# Patient Record
Sex: Male | Born: 1935 | Race: White | Hispanic: No | Smoking: Never smoker
Health system: Southern US, Community
[De-identification: ages and names within clinical notes are randomized; demographics above are authoritative.]

## PROBLEM LIST (undated history)

## (undated) DIAGNOSIS — J45909 Unspecified asthma, uncomplicated: Secondary | ICD-10-CM

## (undated) DIAGNOSIS — I10 Essential (primary) hypertension: Secondary | ICD-10-CM

## (undated) DIAGNOSIS — K219 Gastro-esophageal reflux disease without esophagitis: Secondary | ICD-10-CM

## (undated) DIAGNOSIS — R609 Edema, unspecified: Secondary | ICD-10-CM

## (undated) DIAGNOSIS — F39 Unspecified mood [affective] disorder: Secondary | ICD-10-CM

## (undated) HISTORY — DX: Unspecified mood (affective) disorder: F39

## (undated) HISTORY — PX: HERNIA REPAIR: SHX51

---

## 2009-05-20 ENCOUNTER — Ambulatory Visit: Payer: Self-pay | Admitting: Unknown Physician Specialty

## 2009-05-25 ENCOUNTER — Ambulatory Visit: Payer: Self-pay | Admitting: Gastroenterology

## 2009-06-08 ENCOUNTER — Ambulatory Visit: Payer: Self-pay | Admitting: Surgery

## 2009-06-09 ENCOUNTER — Ambulatory Visit: Payer: Self-pay | Admitting: Surgery

## 2009-06-16 ENCOUNTER — Ambulatory Visit: Payer: Self-pay | Admitting: Surgery

## 2009-06-23 ENCOUNTER — Ambulatory Visit: Payer: Self-pay | Admitting: Surgery

## 2011-10-08 ENCOUNTER — Emergency Department: Payer: Self-pay | Admitting: *Deleted

## 2014-03-08 DIAGNOSIS — J309 Allergic rhinitis, unspecified: Secondary | ICD-10-CM | POA: Insufficient documentation

## 2014-03-08 DIAGNOSIS — I10 Essential (primary) hypertension: Secondary | ICD-10-CM | POA: Insufficient documentation

## 2016-02-08 ENCOUNTER — Encounter: Payer: Self-pay | Admitting: *Deleted

## 2016-02-16 ENCOUNTER — Ambulatory Visit
Admission: RE | Admit: 2016-02-16 | Discharge: 2016-02-16 | Disposition: A | Discharge status: Home / Self Care | Payer: Medicare Other | Source: Ambulatory Visit | Attending: Ophthalmology | Admitting: Ophthalmology

## 2016-02-16 ENCOUNTER — Encounter: Payer: Self-pay | Admitting: *Deleted

## 2016-02-16 ENCOUNTER — Ambulatory Visit: Payer: Medicare Other | Admitting: Anesthesiology

## 2016-02-16 ENCOUNTER — Encounter
Admission: RE | Disposition: A | Discharge status: Home / Self Care | Payer: Self-pay | Source: Ambulatory Visit | Attending: Ophthalmology

## 2016-02-16 DIAGNOSIS — I10 Essential (primary) hypertension: Secondary | ICD-10-CM | POA: Insufficient documentation

## 2016-02-16 DIAGNOSIS — H52222 Regular astigmatism, left eye: Secondary | ICD-10-CM | POA: Diagnosis not present

## 2016-02-16 DIAGNOSIS — H2512 Age-related nuclear cataract, left eye: Secondary | ICD-10-CM | POA: Insufficient documentation

## 2016-02-16 DIAGNOSIS — K219 Gastro-esophageal reflux disease without esophagitis: Secondary | ICD-10-CM | POA: Insufficient documentation

## 2016-02-16 DIAGNOSIS — M7989 Other specified soft tissue disorders: Secondary | ICD-10-CM | POA: Diagnosis not present

## 2016-02-16 DIAGNOSIS — J45909 Unspecified asthma, uncomplicated: Secondary | ICD-10-CM | POA: Diagnosis not present

## 2016-02-16 HISTORY — PX: CATARACT EXTRACTION W/PHACO: SHX586

## 2016-02-16 HISTORY — DX: Unspecified asthma, uncomplicated: J45.909

## 2016-02-16 HISTORY — DX: Gastro-esophageal reflux disease without esophagitis: K21.9

## 2016-02-16 HISTORY — DX: Edema, unspecified: R60.9

## 2016-02-16 HISTORY — DX: Essential (primary) hypertension: I10

## 2016-02-16 SURGERY — PHACOEMULSIFICATION, CATARACT, WITH IOL INSERTION
Anesthesia: Monitor Anesthesia Care | Site: Eye | Laterality: Left | Wound class: Clean

## 2016-02-16 MED ORDER — LIDOCAINE HCL (PF) 4 % IJ SOLN
INTRAMUSCULAR | Status: AC
  Filled 2016-02-16: qty 5

## 2016-02-16 MED ORDER — SODIUM HYALURONATE 10 MG/ML IO SOLN
INTRAOCULAR | Status: AC
  Filled 2016-02-16: qty 0.85

## 2016-02-16 MED ORDER — ARMC OPHTHALMIC DILATING GEL: 1.0000 "application " | OPHTHALMIC | Status: AC | PRN

## 2016-02-16 MED ORDER — TETRACAINE HCL 0.5 % OP SOLN
OPHTHALMIC | Status: AC
  Filled 2016-02-16: qty 2

## 2016-02-16 MED ORDER — MOXIFLOXACIN HCL 0.5 % OP SOLN: 1.0000 [drp] | OPHTHALMIC | Status: DC | PRN

## 2016-02-16 MED ORDER — MIDAZOLAM HCL 2 MG/2ML IJ SOLN
INTRAMUSCULAR | Status: DC | PRN
Start: 2016-02-16 — End: 2016-02-16
  Administered 2016-02-16 (×2): .5 mg via INTRAVENOUS

## 2016-02-16 MED ORDER — FENTANYL CITRATE (PF) 100 MCG/2ML IJ SOLN
INTRAMUSCULAR | Status: DC | PRN
  Administered 2016-02-16 (×2): 25 ug via INTRAVENOUS

## 2016-02-16 MED ORDER — POVIDONE-IODINE 5 % OP SOLN
1.0000 "application " | Freq: Once | OPHTHALMIC | Status: AC
  Administered 2016-02-16: 1 via OPHTHALMIC

## 2016-02-16 MED ORDER — ARMC OPHTHALMIC DILATING GEL
OPHTHALMIC | Status: AC
  Administered 2016-02-16: 09:00:00
  Filled 2016-02-16: qty 0.25

## 2016-02-16 MED ORDER — POVIDONE-IODINE 5 % OP SOLN
OPHTHALMIC | Status: AC
  Filled 2016-02-16: qty 30

## 2016-02-16 MED ORDER — TETRACAINE HCL 0.5 % OP SOLN
1.0000 [drp] | Freq: Once | OPHTHALMIC | Status: AC
  Administered 2016-02-16: 1 [drp] via OPHTHALMIC

## 2016-02-16 MED ORDER — SODIUM HYALURONATE 23 MG/ML IO SOLN
INTRAOCULAR | Status: AC
  Filled 2016-02-16: qty 0.6

## 2016-02-16 MED ORDER — SODIUM CHLORIDE 0.9 % IV SOLN
INTRAVENOUS | Status: DC
  Administered 2016-02-16 (×2): via INTRAVENOUS

## 2016-02-16 MED ORDER — MOXIFLOXACIN HCL 0.5 % OP SOLN
OPHTHALMIC | Status: AC
  Filled 2016-02-16: qty 3

## 2016-02-16 MED ORDER — EPINEPHRINE HCL 1 MG/ML IJ SOLN
INTRAMUSCULAR | Status: AC
  Filled 2016-02-16: qty 2

## 2016-02-16 SURGICAL SUPPLY — 21 items
CANNULA ANT/CHMB 27GA (MISCELLANEOUS) ×4 IMPLANT
CUP MEDICINE 2OZ PLAST GRAD ST (MISCELLANEOUS) ×2 IMPLANT
GLOVE BIO SURGEON STRL SZ8 (GLOVE) ×2 IMPLANT
GLOVE BIOGEL M 6.5 STRL (GLOVE) ×2 IMPLANT
GLOVE SURG LX 7.5 STRW (GLOVE) ×1
GLOVE SURG LX STRL 7.5 STRW (GLOVE) ×1 IMPLANT
GOWN STRL REUS W/ TWL LRG LVL3 (GOWN DISPOSABLE) ×2 IMPLANT
GOWN STRL REUS W/TWL LRG LVL3 (GOWN DISPOSABLE) ×2
LENS IOL TECNIS SYMFONY 24.5 (Intraocular Lens) ×2 IMPLANT
PACK CATARACT (MISCELLANEOUS) ×2 IMPLANT
PACK CATARACT BRASINGTON LX (MISCELLANEOUS) ×2 IMPLANT
PACK EYE AFTER SURG (MISCELLANEOUS) ×2 IMPLANT
SOL BSS BAG (MISCELLANEOUS) ×2
SOL PREP PVP 2OZ (MISCELLANEOUS) ×2
SOLUTION BSS BAG (MISCELLANEOUS) ×1 IMPLANT
SOLUTION PREP PVP 2OZ (MISCELLANEOUS) ×1 IMPLANT
SYR 3ML LL SCALE MARK (SYRINGE) ×4 IMPLANT
SYR 5ML LL (SYRINGE) ×2 IMPLANT
SYR TB 1ML 27GX1/2 LL (SYRINGE) ×2 IMPLANT
WATER STERILE IRR 250ML POUR (IV SOLUTION) ×2 IMPLANT
WIPE NON LINTING 3.25X3.25 (MISCELLANEOUS) ×2 IMPLANT

## 2016-02-16 NOTE — Op Note (Signed)
OPERATIVE NOTE  Brandon Calhoun [id number] 02/16/2016   PREOPERATIVE DIAGNOSIS:  Nuclear sclerotic cataract left eye.  H25.12 2.  Regular astigmatism. 3.  Nasal ptyergium   POSTOPERATIVE DIAGNOSIS:    same.   PROCEDURE:  Phacoemusification with posterior chamber intraocular lens placement of the left eye   LENS:   Implant Name Type Inv. Item Serial No. Manufacturer Lot No. LRB No. Used  LENS IOL SYMFONY 24.5 - [id number] Intraocular Lens LENS IOL SYMFONY 24.5 [id number] AMO   Left 1       [id number] +24.5 at 126 degreess   ULTRASOUND TIME: 1 minutes 10 seconds.  CDE 7.41   SURGEON:  Willey BladeBradley Robbyn Hodkinson, MD, MPH   ANESTHESIA:  Topical with tetracaine drops and 2% Xylocaine jelly, augmented with 1% preservative-free intracameral lidocaine.   COMPLICATIONS:  None.   DESCRIPTION OF PROCEDURE:  The patient was identified in the holding room and the horizontal meridian was marked with the patient sitting upright and transported to the operating room and placed in the supine position under the operating microscope.  The left eye was identified as the operative eye and it was prepped and draped in the usual sterile ophthalmic fashion.   A 1.0 millimeter clear-corneal paracentesis was made at the 5:00 position. 0.5 ml of preservative-free 1% lidocaine with epinephrine was injected into the anterior chamber.  The anterior chamber was filled with Healon 5 viscoelastic.  A 2.4 millimeter keratome was used to make a near-clear corneal incision at the 2:00 position.  A curvilinear capsulorrhexis was made with a cystotome and capsulorrhexis forceps.  Balanced salt solution was used to hydrodissect and hydrodelineate the nucleus.   Phacoemulsification was then used in stop and chop fashion to remove the lens nucleus and epinucleus.  The remaining cortex was then removed using the irrigation and aspiration handpiece. Healon was then placed into the capsular bag to distend it for lens placement.  A lens was  then injected into the capsular bag.  The verion was used to align the lens close to 126 degrees  The remaining viscoelastic was aspirated and the lens was again aligned precisely to 126.   Wounds were hydrated with balanced salt solution.  The anterior chamber was inflated to a physiologic pressure with balanced salt solution.   Intracameral vigamox 0.1 mL undiltued was injected into the eye.  No wound leaks were noted.  Topical Vigamox drops were applied to the eye.  The patient was taken to the recovery room in stable condition without complications of anesthesia or surgery  Willey BladeBradley Aurielle Slingerland 02/16/2016, 10:18 AM

## 2016-02-16 NOTE — Transfer of Care (Signed)
Immediate Anesthesia Transfer of Care Note  Patient: Brandon Calhoun  Procedure(s) Performed: Procedure(s) with comments: CATARACT EXTRACTION PHACO AND INTRAOCULAR LENS PLACEMENT (IOC) (Left) - US 1.10 AP% 10.6 CDE 7.41 Fluid Pack Lot # [id number] H  Patient Location: PACU  Anesthesia Type:MAC  Level of Consciousness: awake, alert  and oriented  Airway & Oxygen Therapy: Patient Spontanous Breathing  Post-op Assessment: Report given to RN and Post -op Vital signs reviewed and stable  Post vital signs: Reviewed and stable  Last Vitals:  Vitals:   02/16/16 0900  BP: (!) 169/63  Pulse: 82  Resp: 18  Temp: 36.9 C    Last Pain:  Vitals:   02/16/16 0900  TempSrc: Oral         Complications: No apparent anesthesia complications

## 2016-02-16 NOTE — Anesthesia Preprocedure Evaluation (Signed)
Anesthesia Evaluation  Patient identified by MRN, date of birth, ID band Patient awake    Reviewed: Allergy & Precautions, H&P , NPO status , Patient's Chart, lab work & pertinent test results, reviewed documented beta blocker date and time   History of Anesthesia Complications Negative for: history of anesthetic complications  Airway Mallampati: I  TM Distance: >3 FB Neck ROM: full    Dental no notable dental hx. (+) Missing   Pulmonary neg shortness of breath, asthma , neg sleep apnea, neg COPD, neg recent URI,           Cardiovascular Exercise Tolerance: Good hypertension, (-) angina(-) CAD, (-) Past MI, (-) Cardiac Stents and (-) CABG (-) dysrhythmias (-) Valvular Problems/Murmurs     Neuro/Psych negative neurological ROS  negative psych ROS   GI/Hepatic Neg liver ROS, GERD  Medicated,  Endo/Other  negative endocrine ROS  Renal/GU negative Renal ROS  negative genitourinary   Musculoskeletal   Abdominal   Peds  Hematology negative hematology ROS (+)   Anesthesia Other Findings Past Medical History: No date: Asthma No date: Edema     Comment: MILD OF FEET No date: GERD (gastroesophageal reflux disease) No date: Hypertension   Reproductive/Obstetrics negative OB ROS                             Anesthesia Physical Anesthesia Plan  ASA: II  Anesthesia Plan: MAC   Post-op Pain Management:    Induction:   Airway Management Planned:   Additional Equipment:   Intra-op Plan:   Post-operative Plan:   Informed Consent: I have reviewed the patients History and Physical, chart, labs and discussed the procedure including the risks, benefits and alternatives for the proposed anesthesia with the patient or authorized representative who has indicated his/her understanding and acceptance.   Dental Advisory Given  Plan Discussed with: Anesthesiologist, CRNA and Surgeon  Anesthesia  Plan Comments:         Anesthesia Quick Evaluation

## 2016-02-16 NOTE — Addendum Note (Signed)
Addendum  created 02/16/16 1021 by Junious SilkMark Kalandra Masters, CRNA   Anesthesia Intra Meds edited

## 2016-02-16 NOTE — Anesthesia Postprocedure Evaluation (Signed)
Anesthesia Post Note  Patient: Brandon Calhoun  Procedure(s) Performed: Procedure(s) (LRB): CATARACT EXTRACTION PHACO AND INTRAOCULAR LENS PLACEMENT (IOC) (Left)  Patient location during evaluation: PACU Anesthesia Type: MAC Level of consciousness: awake Pain management: pain level controlled Vital Signs Assessment: post-procedure vital signs reviewed and stable Respiratory status: spontaneous breathing, nonlabored ventilation and respiratory function stable Cardiovascular status: stable Postop Assessment: no headache Anesthetic complications: no    Last Vitals:  Vitals:   02/16/16 0900  BP: (!) 169/63  Pulse: 82  Resp: 18  Temp: 36.9 C    Last Pain:  Vitals:   02/16/16 0900  TempSrc: Oral                 Devlin Brink,  Dawna Jakes R

## 2016-02-16 NOTE — H&P (Signed)
The History and Physical notes are on paper, have been signed, and are to be scanned. The patient remains stable and unchanged from the H&P.   Previous H&P reviewed, patient examined, and there are no changes.  Brandon BladeBradley Martel Calhoun 02/16/2016 8:59 AM

## 2016-02-16 NOTE — Discharge Instructions (Signed)
Eye Surgery Discharge Instructions  Expect mild scratchy sensation or mild soreness. DO NOT RUB YOUR EYE!  The day of surgery:  Minimal physical activity, but bed rest is not required  No reading, computer work, or close hand work  No bending, lifting, or straining.  May watch TV  For 24 hours:  No driving, legal decisions, or alcoholic beverages  Safety precautions  Eat anything you prefer: It is better to start with liquids, then soup then solid foods.  _____ Eye patch should be worn until postoperative exam tomorrow.  ____ Solar shield eyeglasses should be worn for comfort in the sunlight/patch while sleeping  Resume all regular medications including aspirin or Coumadin if these were discontinued prior to surgery. You may shower, bathe, shave, or wash your hair. Tylenol may be taken for mild discomfort.  Call your doctor if you experience significant pain, nausea, or vomiting, fever > 101 or other signs of infection. [phone] or [phone] Specific instructions:  Follow-up Information    Willey BladeBradley King, MD .   Specialty:  Ophthalmology Why:  02-17-16 at9:40 Contact information: [address] [place] [address] [phone]          Eye Surgery Discharge Instructions  Expect mild scratchy sensation or mild soreness. DO NOT RUB YOUR EYE!  The day of surgery:  Minimal physical activity, but bed rest is not required  No reading, computer work, or close hand work  No bending, lifting, or straining.  May watch TV  For 24 hours:  No driving, legal decisions, or alcoholic beverages  Safety precautions  Eat anything you prefer: It is better to start with liquids, then soup then solid foods.  _____ Eye patch should be worn until postoperative exam tomorrow.  ____ Solar shield eyeglasses should be worn for comfort in the sunlight/patch while sleeping  Resume all regular medications including aspirin or Coumadin if these were discontinued prior  to surgery. You may shower, bathe, shave, or wash your hair. Tylenol may be taken for mild discomfort.  Call your doctor if you experience significant pain, nausea, or vomiting, fever > 101 or other signs of infection. [phone] or [phone] Specific instructions:  Follow-up Information    Willey BladeBradley King, MD .   Specialty:  Ophthalmology Why:  02-17-16 at9:40 Contact information: [address] [place] [address] [phone]

## 2016-02-16 NOTE — Addendum Note (Signed)
Addendum  created 02/16/16 1036 by Junious SilkMark Anyelina Claycomb, CRNA   Anesthesia Intra Blocks edited, Child order released for a procedure order, Sign clinical note

## 2016-02-16 NOTE — Anesthesia Procedure Notes (Signed)
Procedure Name: MAC Date/Time: 02/16/2016 10:05 AM Performed by: Junious SilkNOLES, Satish Hammers Pre-anesthesia Checklist: Patient identified, Emergency Drugs available, Suction available, Patient being monitored and Timeout performed Oxygen Delivery Method: Nasal cannula

## 2016-02-22 DIAGNOSIS — L600 Ingrowing nail: Secondary | ICD-10-CM | POA: Diagnosis not present

## 2016-02-22 DIAGNOSIS — M79675 Pain in left toe(s): Secondary | ICD-10-CM | POA: Diagnosis not present

## 2016-02-22 DIAGNOSIS — M79674 Pain in right toe(s): Secondary | ICD-10-CM | POA: Diagnosis not present

## 2016-02-22 DIAGNOSIS — B351 Tinea unguium: Secondary | ICD-10-CM | POA: Diagnosis not present

## 2016-03-01 DIAGNOSIS — H2511 Age-related nuclear cataract, right eye: Secondary | ICD-10-CM | POA: Diagnosis not present

## 2016-03-02 DIAGNOSIS — I10 Essential (primary) hypertension: Secondary | ICD-10-CM | POA: Diagnosis not present

## 2016-03-02 DIAGNOSIS — Z79899 Other long term (current) drug therapy: Secondary | ICD-10-CM | POA: Diagnosis not present

## 2016-03-06 ENCOUNTER — Encounter: Payer: Self-pay | Admitting: *Deleted

## 2016-03-08 ENCOUNTER — Ambulatory Visit: Payer: Medicare Other | Admitting: Anesthesiology

## 2016-03-08 ENCOUNTER — Encounter
Admission: RE | Disposition: A | Discharge status: Home / Self Care | Payer: Self-pay | Source: Ambulatory Visit | Attending: Ophthalmology

## 2016-03-08 ENCOUNTER — Ambulatory Visit
Admission: RE | Admit: 2016-03-08 | Discharge: 2016-03-08 | Disposition: A | Discharge status: Home / Self Care | Payer: Medicare Other | Source: Ambulatory Visit | Attending: Ophthalmology | Admitting: Ophthalmology

## 2016-03-08 ENCOUNTER — Encounter: Payer: Self-pay | Admitting: *Deleted

## 2016-03-08 DIAGNOSIS — K219 Gastro-esophageal reflux disease without esophagitis: Secondary | ICD-10-CM | POA: Insufficient documentation

## 2016-03-08 DIAGNOSIS — I10 Essential (primary) hypertension: Secondary | ICD-10-CM | POA: Insufficient documentation

## 2016-03-08 DIAGNOSIS — H2511 Age-related nuclear cataract, right eye: Secondary | ICD-10-CM | POA: Diagnosis not present

## 2016-03-08 DIAGNOSIS — Z9842 Cataract extraction status, left eye: Secondary | ICD-10-CM | POA: Insufficient documentation

## 2016-03-08 DIAGNOSIS — J45909 Unspecified asthma, uncomplicated: Secondary | ICD-10-CM | POA: Insufficient documentation

## 2016-03-08 DIAGNOSIS — M7989 Other specified soft tissue disorders: Secondary | ICD-10-CM | POA: Diagnosis not present

## 2016-03-08 DIAGNOSIS — Z79899 Other long term (current) drug therapy: Secondary | ICD-10-CM | POA: Diagnosis not present

## 2016-03-08 HISTORY — PX: CATARACT EXTRACTION W/PHACO: SHX586

## 2016-03-08 SURGERY — PHACOEMULSIFICATION, CATARACT, WITH IOL INSERTION
Anesthesia: Monitor Anesthesia Care | Site: Eye | Laterality: Right | Wound class: Clean

## 2016-03-08 MED ORDER — SODIUM HYALURONATE 23 MG/ML IO SOLN
INTRAOCULAR | Status: DC | PRN
  Administered 2016-03-08: 0.6 mL via INTRAOCULAR

## 2016-03-08 MED ORDER — EPINEPHRINE HCL 1 MG/ML IJ SOLN
INTRAMUSCULAR | Status: AC
  Filled 2016-03-08: qty 2

## 2016-03-08 MED ORDER — MOXIFLOXACIN HCL 0.5 % OP SOLN: 1.0000 [drp] | OPHTHALMIC | Status: DC | PRN

## 2016-03-08 MED ORDER — MOXIFLOXACIN HCL 0.5 % OP SOLN
OPHTHALMIC | Status: AC
  Filled 2016-03-08: qty 3

## 2016-03-08 MED ORDER — MIDAZOLAM HCL 2 MG/2ML IJ SOLN
INTRAMUSCULAR | Status: DC | PRN
  Administered 2016-03-08: .25 mg via INTRAVENOUS

## 2016-03-08 MED ORDER — SODIUM HYALURONATE 23 MG/ML IO SOLN
INTRAOCULAR | Status: AC
  Filled 2016-03-08: qty 0.6

## 2016-03-08 MED ORDER — POVIDONE-IODINE 5 % OP SOLN
1.0000 "application " | Freq: Once | OPHTHALMIC | Status: AC
  Administered 2016-03-08: 1 via OPHTHALMIC

## 2016-03-08 MED ORDER — MOXIFLOXACIN HCL 0.5 % OP SOLN
OPHTHALMIC | Status: DC | PRN
  Administered 2016-03-08: 1 [drp] via OPHTHALMIC

## 2016-03-08 MED ORDER — ARMC OPHTHALMIC DILATING GEL
1.0000 "application " | OPHTHALMIC | Status: AC | PRN
  Administered 2016-03-08 (×2): 1 via OPHTHALMIC

## 2016-03-08 MED ORDER — SODIUM HYALURONATE 10 MG/ML IO SOLN
INTRAOCULAR | Status: DC | PRN
  Administered 2016-03-08: 0.85 mL via INTRAOCULAR

## 2016-03-08 MED ORDER — LIDOCAINE HCL (PF) 4 % IJ SOLN
INTRAOCULAR | Status: DC | PRN
  Administered 2016-03-08: 4 mL via OPHTHALMIC

## 2016-03-08 MED ORDER — SODIUM CHLORIDE 0.9 % IV SOLN
INTRAVENOUS | Status: DC
  Administered 2016-03-08: 09:00:00 via INTRAVENOUS

## 2016-03-08 MED ORDER — ARMC OPHTHALMIC DILATING GEL
OPHTHALMIC | Status: AC
  Administered 2016-03-08: 1 via OPHTHALMIC
  Filled 2016-03-08: qty 0.25

## 2016-03-08 MED ORDER — TETRACAINE HCL 0.5 % OP SOLN
OPHTHALMIC | Status: AC
  Administered 2016-03-08: 1 [drp] via OPHTHALMIC
  Filled 2016-03-08: qty 2

## 2016-03-08 MED ORDER — EPINEPHRINE HCL 1 MG/ML IJ SOLN
INTRAMUSCULAR | Status: DC | PRN
  Administered 2016-03-08: 10:00:00 via OPHTHALMIC

## 2016-03-08 MED ORDER — POVIDONE-IODINE 5 % OP SOLN
OPHTHALMIC | Status: AC
  Administered 2016-03-08: 1 via OPHTHALMIC
  Filled 2016-03-08: qty 30

## 2016-03-08 MED ORDER — SODIUM HYALURONATE 10 MG/ML IO SOLN
INTRAOCULAR | Status: AC
  Filled 2016-03-08: qty 0.85

## 2016-03-08 MED ORDER — TETRACAINE HCL 0.5 % OP SOLN
1.0000 [drp] | Freq: Once | OPHTHALMIC | Status: AC
  Administered 2016-03-08: 1 [drp] via OPHTHALMIC

## 2016-03-08 MED ORDER — LIDOCAINE HCL (PF) 4 % IJ SOLN
INTRAMUSCULAR | Status: AC
  Filled 2016-03-08: qty 5

## 2016-03-08 SURGICAL SUPPLY — 22 items
CANNULA ANT/CHMB 27GA (MISCELLANEOUS) ×4 IMPLANT
CUP MEDICINE 2OZ PLAST GRAD ST (MISCELLANEOUS) ×2 IMPLANT
GLOVE BIO SURGEON STRL SZ8 (GLOVE) ×2 IMPLANT
GLOVE BIOGEL M 6.5 STRL (GLOVE) ×2 IMPLANT
GLOVE SURG LX 7.5 STRW (GLOVE) ×1
GLOVE SURG LX STRL 7.5 STRW (GLOVE) ×1 IMPLANT
GOWN STRL REUS W/ TWL LRG LVL3 (GOWN DISPOSABLE) ×2 IMPLANT
GOWN STRL REUS W/TWL LRG LVL3 (GOWN DISPOSABLE) ×2
LENS IOL SYMFONY TORIC 23.0 (Intraocular Lens) ×2 IMPLANT
LENS IOL SYMFONY TRC 150 23.0 (Intraocular Lens) ×1 IMPLANT
PACK CATARACT (MISCELLANEOUS) ×2 IMPLANT
PACK CATARACT BRASINGTON LX (MISCELLANEOUS) ×2 IMPLANT
PACK EYE AFTER SURG (MISCELLANEOUS) ×2 IMPLANT
SOL BSS BAG (MISCELLANEOUS) ×2
SOL PREP PVP 2OZ (MISCELLANEOUS) ×2
SOLUTION BSS BAG (MISCELLANEOUS) ×1 IMPLANT
SOLUTION PREP PVP 2OZ (MISCELLANEOUS) ×1 IMPLANT
SYR 3ML LL SCALE MARK (SYRINGE) ×4 IMPLANT
SYR 5ML LL (SYRINGE) ×2 IMPLANT
SYR TB 1ML 27GX1/2 LL (SYRINGE) ×2 IMPLANT
WATER STERILE IRR 250ML POUR (IV SOLUTION) ×2 IMPLANT
WIPE NON LINTING 3.25X3.25 (MISCELLANEOUS) ×2 IMPLANT

## 2016-03-08 NOTE — Progress Notes (Signed)
Solar shield glasses

## 2016-03-08 NOTE — H&P (Signed)
The History and Physical notes are on paper, have been signed, and are to be scanned. The patient remains stable and unchanged from the H&P.   Previous H&P reviewed, patient examined, and there are no changes.  Brandon BladeBradley Ellarae Calhoun 03/08/2016 9:31 AM

## 2016-03-08 NOTE — Transfer of Care (Signed)
Immediate Anesthesia Transfer of Care Note  Patient: Brandon Calhoun  Procedure(s) Performed: Procedure(s) with comments: CATARACT EXTRACTION PHACO AND INTRAOCULAR LENS PLACEMENT (IOC) (Right) - US 1.14 AP% 11.6 CDE 11.80 Fluid Pack Lot # [id number] H  Patient Location: PACU  Anesthesia Type:MAC  Level of Consciousness: awake, alert  and oriented  Airway & Oxygen Therapy: Patient Spontanous Breathing  Post-op Assessment: Report given to RN and Post -op Vital signs reviewed and stable  Post vital signs: Reviewed and stable  Last Vitals:  Vitals:   03/08/16 0831 03/08/16 1008  BP: (!) 143/70 137/66  Pulse: 79 69  Resp: 16 16  Temp: 36.6 C (!) 35.9 C    Last Pain:  Vitals:   03/08/16 1008  TempSrc: Tympanic         Complications: No apparent anesthesia complications

## 2016-03-08 NOTE — Anesthesia Postprocedure Evaluation (Signed)
Anesthesia Post Note  Patient: Brandon Calhoun  Procedure(s) Performed: Procedure(s) (LRB): CATARACT EXTRACTION PHACO AND INTRAOCULAR LENS PLACEMENT (IOC) (Right)  Patient location during evaluation: PACU Level of consciousness: awake and alert Pain management: pain level controlled Vital Signs Assessment: post-procedure vital signs reviewed and stable Respiratory status: spontaneous breathing Cardiovascular status: blood pressure returned to baseline and stable Postop Assessment: no headache and no backache Anesthetic complications: no    Last Vitals:  Vitals:   03/08/16 0831 03/08/16 1008  BP: (!) 143/70 137/66  Pulse: 79 69  Resp: 16 16  Temp: 36.6 C (!) 35.9 C    Last Pain:  Vitals:   03/08/16 1008  TempSrc: Tympanic                 Braxson Hollingsworth C

## 2016-03-08 NOTE — Discharge Instructions (Signed)
Eye Surgery Discharge Instructions  Expect mild scratchy sensation or mild soreness. DO NOT RUB YOUR EYE!  The day of surgery:  Minimal physical activity, but bed rest is not required  No reading, computer work, or close hand work  No bending, lifting, or straining.  May watch TV  For 24 hours:  No driving, legal decisions, or alcoholic beverages  Safety precautions  Eat anything you prefer: It is better to start with liquids, then soup then solid foods.  _____ Eye patch should be worn until postoperative exam tomorrow.  ____ Solar shield eyeglasses should be worn for comfort in the sunlight/patch while sleeping  Resume all regular medications including aspirin or Coumadin if these were discontinued prior to surgery. You may shower, bathe, shave, or wash your hair. Tylenol may be taken for mild discomfort.  Call your doctor if you experience significant pain, nausea, or vomiting, fever > 101 or other signs of infection. [phone] or [phone] Specific instructions:  Follow-up Information    Willey BladeBradley King, MD .   Specialty:  Ophthalmology Why:  September 22 at 9:40am Contact information: [address] [place] [address] [phone]        Jonetta SpeakKING, ANJANETTE THOMPSON, NP .   Specialty:  Obstetrics and Gynecology Contact information: [address] [place] [address] [phone]

## 2016-03-08 NOTE — Op Note (Signed)
OPERATIVE NOTE  Brandon Calhoun [id number] 03/08/2016   PREOPERATIVE DIAGNOSIS:  Nuclear sclerotic cataract right eye.  H25.11   POSTOPERATIVE DIAGNOSIS:    Nuclear sclerotic cataract right eye.     PROCEDURE:  Phacoemusification with posterior chamber intraocular lens placement of the right eye   LENS:   Implant Name Type Inv. Item Serial No. Manufacturer Lot No. LRB No. Used  Tecnis Symfony Toric lens     [id number] [facility]   Right 1       [employer] Symfony Toric [id number] 23.0 D IOL rotated to 019 degrees   ULTRASOUND TIME: 1 minutes 14 seconds.  CDE 11.80   SURGEON:  Brandon BladeBradley Mala Gibbard, MD, MPH  ANESTHESIOLOGIST: Anesthesiologist: Brandon DeanWilliam K Kephart, MD CRNA: [name]   ANESTHESIA:  Topical with tetracaine drops and 2% Xylocaine jelly, augmented with 1% preservative-free intracameral lidocaine.  ESTIMATED BLOOD LOSS: less than 1 mL.   COMPLICATIONS:  None.   DESCRIPTION OF PROCEDURE:  The patient was identified in the holding room and transported to the operating room and placed in the supine position under the operating microscope.  The right eye was identified as the operative eye and it was prepped and draped in the usual sterile ophthalmic fashion.  The verion was registered.   A 1.0 millimeter clear-corneal paracentesis was made at the 10:30 position. 0.5 ml of preservative-free 1% lidocaine with epinephrine was injected into the anterior chamber.  The anterior chamber was filled with Healon 5 viscoelastic.  A 2.4 millimeter keratome was used to make a near-clear corneal incision at the 8:00 position.  A curvilinear capsulorrhexis was made with a cystotome and capsulorrhexis forceps.  Balanced salt solution was used to hydrodissect and hydrodelineate the nucleus.   Phacoemulsification was then used in stop and chop fashion to remove the lens nucleus and epinucleus.  The remaining cortex was then removed using the irrigation and aspiration handpiece. Healon was then placed  into the capsular bag to distend it for lens placement.  A lens was then injected into the capsular bag.    The verion system was used to align the lens.  The remaining viscoelastic was aspirated.  The verion was used to align the lens precisely at 019 degrees.   Wounds were hydrated with balanced salt solution.  The anterior chamber was inflated to a physiologic pressure with balanced salt solution.    Intracameral vigamox 0.1 mL undiluted was injected into the eye.  No wound leaks were noted.  Topical Vigamox drops were applied to the eye.  The patient was taken to the recovery room in stable condition without complications of anesthesia or surgery  Brandon BladeBradley Marisha Renier 03/08/2016, 10:08 AM

## 2016-03-08 NOTE — Anesthesia Preprocedure Evaluation (Signed)
Anesthesia Evaluation  Patient identified by MRN, date of birth, ID band Patient awake    Reviewed: Allergy & Precautions, NPO status , Patient's Chart, lab work & pertinent test results  History of Anesthesia Complications Negative for: history of anesthetic complications  Airway Mallampati: II       Dental   Pulmonary asthma ,           Cardiovascular hypertension, Pt. on medications      Neuro/Psych negative neurological ROS     GI/Hepatic Neg liver ROS, GERD  Medicated and Controlled,  Endo/Other  negative endocrine ROS  Renal/GU negative Renal ROS     Musculoskeletal   Abdominal   Peds  Hematology negative hematology ROS (+)   Anesthesia Other Findings   Reproductive/Obstetrics                             Anesthesia Physical Anesthesia Plan  ASA: II  Anesthesia Plan: MAC   Post-op Pain Management:    Induction: Intravenous  Airway Management Planned: Nasal Cannula  Additional Equipment:   Intra-op Plan:   Post-operative Plan:   Informed Consent: I have reviewed the patients History and Physical, chart, labs and discussed the procedure including the risks, benefits and alternatives for the proposed anesthesia with the patient or authorized representative who has indicated his/her understanding and acceptance.     Plan Discussed with:   Anesthesia Plan Comments:         Anesthesia Quick Evaluation

## 2016-03-09 ENCOUNTER — Encounter: Payer: Self-pay | Admitting: Ophthalmology

## 2016-03-16 DIAGNOSIS — Z0000 Encounter for general adult medical examination without abnormal findings: Secondary | ICD-10-CM | POA: Diagnosis not present

## 2016-03-16 DIAGNOSIS — Z79899 Other long term (current) drug therapy: Secondary | ICD-10-CM | POA: Diagnosis not present

## 2016-03-16 DIAGNOSIS — Z23 Encounter for immunization: Secondary | ICD-10-CM | POA: Diagnosis not present

## 2016-03-16 DIAGNOSIS — I10 Essential (primary) hypertension: Secondary | ICD-10-CM | POA: Diagnosis not present

## 2016-04-13 DIAGNOSIS — Z961 Presence of intraocular lens: Secondary | ICD-10-CM | POA: Diagnosis not present

## 2016-04-30 DIAGNOSIS — X32XXXA Exposure to sunlight, initial encounter: Secondary | ICD-10-CM | POA: Diagnosis not present

## 2016-04-30 DIAGNOSIS — D2261 Melanocytic nevi of right upper limb, including shoulder: Secondary | ICD-10-CM | POA: Diagnosis not present

## 2016-04-30 DIAGNOSIS — D2262 Melanocytic nevi of left upper limb, including shoulder: Secondary | ICD-10-CM | POA: Diagnosis not present

## 2016-04-30 DIAGNOSIS — L570 Actinic keratosis: Secondary | ICD-10-CM | POA: Diagnosis not present

## 2016-04-30 DIAGNOSIS — Z85828 Personal history of other malignant neoplasm of skin: Secondary | ICD-10-CM | POA: Diagnosis not present

## 2016-04-30 DIAGNOSIS — D225 Melanocytic nevi of trunk: Secondary | ICD-10-CM | POA: Diagnosis not present

## 2016-07-05 ENCOUNTER — Emergency Department: Payer: Medicare Other

## 2016-07-05 ENCOUNTER — Inpatient Hospital Stay
Admission: EM | Admit: 2016-07-05 | Discharge: 2016-07-09 | DRG: 470 | Disposition: A | Discharge status: Skilled Nursing Facility | Payer: Medicare Other | Attending: Internal Medicine | Admitting: Internal Medicine

## 2016-07-05 ENCOUNTER — Encounter: Payer: Self-pay | Admitting: Emergency Medicine

## 2016-07-05 DIAGNOSIS — G8918 Other acute postprocedural pain: Secondary | ICD-10-CM

## 2016-07-05 DIAGNOSIS — E860 Dehydration: Secondary | ICD-10-CM | POA: Diagnosis not present

## 2016-07-05 DIAGNOSIS — Y9301 Activity, walking, marching and hiking: Secondary | ICD-10-CM | POA: Diagnosis present

## 2016-07-05 DIAGNOSIS — W010XXA Fall on same level from slipping, tripping and stumbling without subsequent striking against object, initial encounter: Secondary | ICD-10-CM | POA: Diagnosis present

## 2016-07-05 DIAGNOSIS — E871 Hypo-osmolality and hyponatremia: Secondary | ICD-10-CM | POA: Diagnosis not present

## 2016-07-05 DIAGNOSIS — S299XXA Unspecified injury of thorax, initial encounter: Secondary | ICD-10-CM | POA: Diagnosis not present

## 2016-07-05 DIAGNOSIS — M6281 Muscle weakness (generalized): Secondary | ICD-10-CM

## 2016-07-05 DIAGNOSIS — Z7951 Long term (current) use of inhaled steroids: Secondary | ICD-10-CM | POA: Diagnosis not present

## 2016-07-05 DIAGNOSIS — Z79899 Other long term (current) drug therapy: Secondary | ICD-10-CM | POA: Diagnosis not present

## 2016-07-05 DIAGNOSIS — S72009A Fracture of unspecified part of neck of unspecified femur, initial encounter for closed fracture: Secondary | ICD-10-CM | POA: Diagnosis present

## 2016-07-05 DIAGNOSIS — J45909 Unspecified asthma, uncomplicated: Secondary | ICD-10-CM | POA: Diagnosis present

## 2016-07-05 DIAGNOSIS — D72829 Elevated white blood cell count, unspecified: Secondary | ICD-10-CM | POA: Diagnosis present

## 2016-07-05 DIAGNOSIS — Y92009 Unspecified place in unspecified non-institutional (private) residence as the place of occurrence of the external cause: Secondary | ICD-10-CM

## 2016-07-05 DIAGNOSIS — Z419 Encounter for procedure for purposes other than remedying health state, unspecified: Secondary | ICD-10-CM

## 2016-07-05 DIAGNOSIS — K219 Gastro-esophageal reflux disease without esophagitis: Secondary | ICD-10-CM | POA: Diagnosis present

## 2016-07-05 DIAGNOSIS — M25552 Pain in left hip: Secondary | ICD-10-CM | POA: Diagnosis not present

## 2016-07-05 DIAGNOSIS — I10 Essential (primary) hypertension: Secondary | ICD-10-CM | POA: Diagnosis not present

## 2016-07-05 DIAGNOSIS — R262 Difficulty in walking, not elsewhere classified: Secondary | ICD-10-CM

## 2016-07-05 DIAGNOSIS — S72002A Fracture of unspecified part of neck of left femur, initial encounter for closed fracture: Secondary | ICD-10-CM | POA: Diagnosis not present

## 2016-07-05 DIAGNOSIS — S72042A Displaced fracture of base of neck of left femur, initial encounter for closed fracture: Secondary | ICD-10-CM | POA: Diagnosis not present

## 2016-07-05 DIAGNOSIS — W19XXXA Unspecified fall, initial encounter: Secondary | ICD-10-CM | POA: Diagnosis not present

## 2016-07-05 MED ORDER — MORPHINE SULFATE (PF) 2 MG/ML IV SOLN
INTRAVENOUS | Status: AC
  Administered 2016-07-05: 2 mg via INTRAVENOUS
  Filled 2016-07-05: qty 1

## 2016-07-05 MED ORDER — MORPHINE SULFATE (PF) 2 MG/ML IV SOLN
2.0000 mg | Freq: Once | INTRAVENOUS | Status: AC
  Administered 2016-07-05: 2 mg via INTRAVENOUS

## 2016-07-05 NOTE — ED Notes (Signed)
Patient transported to X-ray 

## 2016-07-05 NOTE — ED Triage Notes (Signed)
Patient lives alone at [facility] and  Has no hx of falls.  Tonight he went to the kitchen and said when he turned to go back to his recliner he fell.  Pt denies hitting his lead, weakness, or any LOC prior to his fall.  He does not use a [name] or cane at home.  Pt does have slight hematoma on left arm.  VS are WNL.

## 2016-07-06 ENCOUNTER — Encounter: Payer: Self-pay | Admitting: Internal Medicine

## 2016-07-06 ENCOUNTER — Inpatient Hospital Stay: Payer: Medicare Other

## 2016-07-06 ENCOUNTER — Encounter
Admission: EM | Disposition: A | Discharge status: Skilled Nursing Facility | Payer: Self-pay | Source: Home / Self Care | Attending: Internal Medicine

## 2016-07-06 ENCOUNTER — Emergency Department: Payer: Medicare Other

## 2016-07-06 ENCOUNTER — Inpatient Hospital Stay: Payer: Medicare Other | Admitting: Anesthesiology

## 2016-07-06 DIAGNOSIS — S299XXA Unspecified injury of thorax, initial encounter: Secondary | ICD-10-CM | POA: Diagnosis not present

## 2016-07-06 DIAGNOSIS — S72009A Fracture of unspecified part of neck of unspecified femur, initial encounter for closed fracture: Secondary | ICD-10-CM | POA: Diagnosis present

## 2016-07-06 DIAGNOSIS — Y92009 Unspecified place in unspecified non-institutional (private) residence as the place of occurrence of the external cause: Secondary | ICD-10-CM | POA: Diagnosis not present

## 2016-07-06 DIAGNOSIS — Z96642 Presence of left artificial hip joint: Secondary | ICD-10-CM | POA: Diagnosis not present

## 2016-07-06 DIAGNOSIS — D72829 Elevated white blood cell count, unspecified: Secondary | ICD-10-CM | POA: Diagnosis not present

## 2016-07-06 DIAGNOSIS — Z471 Aftercare following joint replacement surgery: Secondary | ICD-10-CM | POA: Diagnosis not present

## 2016-07-06 DIAGNOSIS — E871 Hypo-osmolality and hyponatremia: Secondary | ICD-10-CM | POA: Diagnosis not present

## 2016-07-06 DIAGNOSIS — W010XXA Fall on same level from slipping, tripping and stumbling without subsequent striking against object, initial encounter: Secondary | ICD-10-CM | POA: Diagnosis present

## 2016-07-06 DIAGNOSIS — Z79899 Other long term (current) drug therapy: Secondary | ICD-10-CM | POA: Diagnosis not present

## 2016-07-06 DIAGNOSIS — S72002D Fracture of unspecified part of neck of left femur, subsequent encounter for closed fracture with routine healing: Secondary | ICD-10-CM | POA: Diagnosis not present

## 2016-07-06 DIAGNOSIS — R2681 Unsteadiness on feet: Secondary | ICD-10-CM | POA: Diagnosis not present

## 2016-07-06 DIAGNOSIS — S72002A Fracture of unspecified part of neck of left femur, initial encounter for closed fracture: Secondary | ICD-10-CM | POA: Diagnosis not present

## 2016-07-06 DIAGNOSIS — I10 Essential (primary) hypertension: Secondary | ICD-10-CM | POA: Diagnosis not present

## 2016-07-06 DIAGNOSIS — M25552 Pain in left hip: Secondary | ICD-10-CM | POA: Diagnosis not present

## 2016-07-06 DIAGNOSIS — J45909 Unspecified asthma, uncomplicated: Secondary | ICD-10-CM | POA: Diagnosis not present

## 2016-07-06 DIAGNOSIS — R269 Unspecified abnormalities of gait and mobility: Secondary | ICD-10-CM | POA: Diagnosis not present

## 2016-07-06 DIAGNOSIS — K219 Gastro-esophageal reflux disease without esophagitis: Secondary | ICD-10-CM | POA: Diagnosis present

## 2016-07-06 DIAGNOSIS — Z7951 Long term (current) use of inhaled steroids: Secondary | ICD-10-CM | POA: Diagnosis not present

## 2016-07-06 DIAGNOSIS — Y9301 Activity, walking, marching and hiking: Secondary | ICD-10-CM | POA: Diagnosis present

## 2016-07-06 DIAGNOSIS — M24511 Contracture, right shoulder: Secondary | ICD-10-CM | POA: Diagnosis not present

## 2016-07-06 DIAGNOSIS — Z9181 History of falling: Secondary | ICD-10-CM | POA: Diagnosis not present

## 2016-07-06 DIAGNOSIS — Z96641 Presence of right artificial hip joint: Secondary | ICD-10-CM | POA: Diagnosis not present

## 2016-07-06 DIAGNOSIS — Z7401 Bed confinement status: Secondary | ICD-10-CM | POA: Diagnosis not present

## 2016-07-06 DIAGNOSIS — S72042A Displaced fracture of base of neck of left femur, initial encounter for closed fracture: Secondary | ICD-10-CM | POA: Diagnosis not present

## 2016-07-06 DIAGNOSIS — E860 Dehydration: Secondary | ICD-10-CM | POA: Diagnosis not present

## 2016-07-06 DIAGNOSIS — M6281 Muscle weakness (generalized): Secondary | ICD-10-CM | POA: Diagnosis not present

## 2016-07-06 DIAGNOSIS — R600 Localized edema: Secondary | ICD-10-CM | POA: Diagnosis not present

## 2016-07-06 HISTORY — PX: HIP ARTHROPLASTY: SHX981

## 2016-07-06 LAB — BASIC METABOLIC PANEL
ANION GAP: 6 (ref 5–15)
BUN: 21 mg/dL — ABNORMAL HIGH (ref 6–20)
CHLORIDE: 104 mmol/L (ref 101–111)
CO2: 22 mmol/L (ref 22–32)
Calcium: 8.1 mg/dL — ABNORMAL LOW (ref 8.9–10.3)
Creatinine, Ser: 0.66 mg/dL (ref 0.61–1.24)
GFR calc Af Amer: >60 mL/min (ref >60)
GFR calc non Af Amer: >60 mL/min (ref >60)
GLUCOSE: 119 mg/dL — AB (ref 65–99)
POTASSIUM: 4.1 mmol/L (ref 3.5–5.1)
Sodium: 132 mmol/L — ABNORMAL LOW (ref 135–145)

## 2016-07-06 LAB — CBC WITH DIFFERENTIAL/PLATELET
BASOS PCT: 0 %
Basophils Absolute: 0 10*3/uL (ref 0–0.1)
EOS ABS: 0.1 10*3/uL (ref 0–0.7)
Eosinophils Relative: 0 %
HEMATOCRIT: 36.2 % — AB (ref 40.0–52.0)
Hemoglobin: 12.5 g/dL — ABNORMAL LOW (ref 13.0–18.0)
Lymphocytes Relative: 10 %
Lymphs Abs: 1.3 10*3/uL (ref 1.0–3.6)
MCH: 31.4 pg (ref 26.0–34.0)
MCHC: 34.6 g/dL (ref 32.0–36.0)
MCV: 90.6 fL (ref 80.0–100.0)
MONOS PCT: 7 %
Monocytes Absolute: 0.9 10*3/uL (ref 0.2–1.0)
NEUTROS ABS: 10.3 10*3/uL — AB (ref 1.4–6.5)
Neutrophils Relative %: 83 %
Platelets: 252 10*3/uL (ref 150–440)
RBC: 4 MIL/uL — ABNORMAL LOW (ref 4.40–5.90)
RDW: 14.1 % (ref 11.5–14.5)
WBC: 12.6 10*3/uL — ABNORMAL HIGH (ref 3.8–10.6)

## 2016-07-06 LAB — COMPREHENSIVE METABOLIC PANEL
ALBUMIN: 4 g/dL (ref 3.5–5.0)
ALK PHOS: 69 U/L (ref 38–126)
ALT: 23 U/L (ref 17–63)
AST: 23 U/L (ref 15–41)
Anion gap: 6 (ref 5–15)
BUN: 23 mg/dL — AB (ref 6–20)
CALCIUM: 8.6 mg/dL — AB (ref 8.9–10.3)
CO2: 23 mmol/L (ref 22–32)
CREATININE: 0.81 mg/dL (ref 0.61–1.24)
Chloride: 105 mmol/L (ref 101–111)
GFR calc Af Amer: >60 mL/min (ref >60)
GFR calc non Af Amer: >60 mL/min (ref >60)
Glucose, Bld: 137 mg/dL — ABNORMAL HIGH (ref 65–99)
Potassium: 4.2 mmol/L (ref 3.5–5.1)
SODIUM: 134 mmol/L — AB (ref 135–145)
Total Bilirubin: 0.8 mg/dL (ref 0.3–1.2)
Total Protein: 6.6 g/dL (ref 6.5–8.1)

## 2016-07-06 LAB — CBC
HEMATOCRIT: 34.2 % — AB (ref 40.0–52.0)
Hemoglobin: 12 g/dL — ABNORMAL LOW (ref 13.0–18.0)
MCH: 31.5 pg (ref 26.0–34.0)
MCHC: 35 g/dL (ref 32.0–36.0)
MCV: 89.9 fL (ref 80.0–100.0)
Platelets: 243 10*3/uL (ref 150–440)
RBC: 3.8 MIL/uL — ABNORMAL LOW (ref 4.40–5.90)
RDW: 14.1 % (ref 11.5–14.5)
WBC: 13 10*3/uL — ABNORMAL HIGH (ref 3.8–10.6)

## 2016-07-06 LAB — SURGICAL PCR SCREEN
MRSA, PCR: NEGATIVE
Staphylococcus aureus: NEGATIVE

## 2016-07-06 LAB — PROTIME-INR
INR: 1.05
Prothrombin Time: 13.7 seconds (ref 11.4–15.2)

## 2016-07-06 LAB — TYPE AND SCREEN
ABO/RH(D): A NEG
Antibody Screen: NEGATIVE

## 2016-07-06 SURGERY — HEMIARTHROPLASTY, HIP, DIRECT ANTERIOR APPROACH, FOR FRACTURE
Anesthesia: General | Laterality: Left | Wound class: Clean

## 2016-07-06 MED ORDER — ACETAMINOPHEN 500 MG PO TABS
1000.0000 mg | ORAL_TABLET | Freq: Four times a day (QID) | ORAL | Status: AC
  Administered 2016-07-07 (×2): 1000 mg via ORAL
  Filled 2016-07-06 (×2): qty 2

## 2016-07-06 MED ORDER — PROPOFOL 500 MG/50ML IV EMUL
INTRAVENOUS | Status: DC | PRN
  Administered 2016-07-06: 25 ug/kg/min via INTRAVENOUS

## 2016-07-06 MED ORDER — EPHEDRINE SULFATE 50 MG/ML IJ SOLN
INTRAMUSCULAR | Status: DC | PRN
  Administered 2016-07-06 (×2): 10 mg via INTRAVENOUS

## 2016-07-06 MED ORDER — BUPIVACAINE-EPINEPHRINE 0.25% -1:200000 IJ SOLN
INTRAMUSCULAR | Status: DC | PRN
  Administered 2016-07-06: 30 mL

## 2016-07-06 MED ORDER — PHENYLEPHRINE 40 MCG/ML (10ML) SYRINGE FOR IV PUSH (FOR BLOOD PRESSURE SUPPORT)
PREFILLED_SYRINGE | INTRAVENOUS | Status: AC
  Filled 2016-07-06: qty 10

## 2016-07-06 MED ORDER — MAGNESIUM HYDROXIDE 400 MG/5ML PO SUSP: 30.0000 mL | Freq: Every day | ORAL | Status: DC | PRN

## 2016-07-06 MED ORDER — NEOMYCIN-POLYMYXIN B GU 40-200000 IR SOLN
Status: DC | PRN
  Administered 2016-07-06: 4 mL

## 2016-07-06 MED ORDER — MORPHINE SULFATE (PF) 2 MG/ML IV SOLN
2.0000 mg | Freq: Once | INTRAVENOUS | Status: AC
  Administered 2016-07-06: 2 mg via INTRAVENOUS
  Filled 2016-07-06: qty 1

## 2016-07-06 MED ORDER — OXYCODONE HCL 5 MG PO TABS
5.0000 mg | ORAL_TABLET | ORAL | Status: DC | PRN
  Administered 2016-07-06: 10 mg via ORAL
  Filled 2016-07-06: qty 2

## 2016-07-06 MED ORDER — ARTIFICIAL TEARS OP OINT
1.0000 "application " | TOPICAL_OINTMENT | OPHTHALMIC | Status: DC | PRN
  Filled 2016-07-06: qty 3.5

## 2016-07-06 MED ORDER — MAGNESIUM CITRATE PO SOLN
1.0000 | Freq: Once | ORAL | Status: DC | PRN
  Filled 2016-07-06: qty 296

## 2016-07-06 MED ORDER — LIDOCAINE HCL (PF) 2 % IJ SOLN
INTRAMUSCULAR | Status: AC
  Filled 2016-07-06: qty 2

## 2016-07-06 MED ORDER — FENTANYL CITRATE (PF) 100 MCG/2ML IJ SOLN
INTRAMUSCULAR | Status: AC
  Filled 2016-07-06: qty 2

## 2016-07-06 MED ORDER — FENTANYL CITRATE (PF) 100 MCG/2ML IJ SOLN: 25.0000 ug | INTRAMUSCULAR | Status: DC | PRN

## 2016-07-06 MED ORDER — MIDAZOLAM HCL 2 MG/2ML IJ SOLN
INTRAMUSCULAR | Status: AC
  Filled 2016-07-06: qty 2

## 2016-07-06 MED ORDER — HYDROCODONE-ACETAMINOPHEN 5-325 MG PO TABS
1.0000 | ORAL_TABLET | ORAL | Status: DC | PRN
  Administered 2016-07-06 (×2): 1 via ORAL
  Filled 2016-07-06 (×2): qty 1
  Filled 2016-07-06: qty 2

## 2016-07-06 MED ORDER — CEFAZOLIN IN D5W 1 GM/50ML IV SOLN
1.0000 g | Freq: Once | INTRAVENOUS | Status: AC
  Administered 2016-07-06: 1 g via INTRAVENOUS
  Filled 2016-07-06: qty 50

## 2016-07-06 MED ORDER — SODIUM CHLORIDE 0.9 % IV SOLN
INTRAVENOUS | Status: DC
  Administered 2016-07-06: 20:00:00 via INTRAVENOUS

## 2016-07-06 MED ORDER — EPHEDRINE 5 MG/ML INJ
INTRAVENOUS | Status: AC
  Filled 2016-07-06: qty 10

## 2016-07-06 MED ORDER — ALUM & MAG HYDROXIDE-SIMETH 200-200-20 MG/5ML PO SUSP: 30.0000 mL | ORAL | Status: DC | PRN

## 2016-07-06 MED ORDER — PROPOFOL 10 MG/ML IV BOLUS
INTRAVENOUS | Status: DC | PRN
  Administered 2016-07-06: 20 mg via INTRAVENOUS

## 2016-07-06 MED ORDER — MORPHINE SULFATE (PF) 2 MG/ML IV SOLN
2.0000 mg | INTRAVENOUS | Status: DC | PRN
Start: 2016-07-06 — End: 2016-07-09
  Administered 2016-07-06 (×3): 2 mg via INTRAVENOUS
  Filled 2016-07-06 (×3): qty 1

## 2016-07-06 MED ORDER — SENNOSIDES-DOCUSATE SODIUM 8.6-50 MG PO TABS: 1.0000 | ORAL_TABLET | Freq: Every evening | ORAL | Status: DC | PRN

## 2016-07-06 MED ORDER — FAMOTIDINE 20 MG PO TABS
10.0000 mg | ORAL_TABLET | Freq: Every day | ORAL | Status: DC
  Administered 2016-07-06 – 2016-07-08 (×3): 10 mg via ORAL
  Filled 2016-07-06 (×4): qty 1

## 2016-07-06 MED ORDER — MENTHOL 3 MG MT LOZG
1.0000 | LOZENGE | OROMUCOSAL | Status: DC | PRN
  Filled 2016-07-06: qty 9

## 2016-07-06 MED ORDER — FENTANYL CITRATE (PF) 100 MCG/2ML IJ SOLN
INTRAMUSCULAR | Status: DC | PRN
  Administered 2016-07-06: 50 ug via INTRAVENOUS

## 2016-07-06 MED ORDER — MORPHINE SULFATE (PF) 4 MG/ML IV SOLN
4.0000 mg | Freq: Once | INTRAVENOUS | Status: AC
  Administered 2016-07-06: 4 mg via INTRAVENOUS

## 2016-07-06 MED ORDER — PROPOFOL 500 MG/50ML IV EMUL
INTRAVENOUS | Status: AC
  Filled 2016-07-06: qty 50

## 2016-07-06 MED ORDER — BUPIVACAINE HCL (PF) 0.5 % IJ SOLN
INTRAMUSCULAR | Status: AC
  Filled 2016-07-06: qty 10

## 2016-07-06 MED ORDER — LISINOPRIL 10 MG PO TABS
10.0000 mg | ORAL_TABLET | Freq: Every day | ORAL | Status: DC
  Administered 2016-07-06 – 2016-07-09 (×3): 10 mg via ORAL
  Filled 2016-07-06 (×3): qty 1

## 2016-07-06 MED ORDER — MOMETASONE FURO-FORMOTEROL FUM 200-5 MCG/ACT IN AERO
2.0000 | INHALATION_SPRAY | Freq: Two times a day (BID) | RESPIRATORY_TRACT | Status: DC
  Administered 2016-07-06 – 2016-07-09 (×6): 2 via RESPIRATORY_TRACT
  Filled 2016-07-06 (×2): qty 8.8

## 2016-07-06 MED ORDER — SODIUM CHLORIDE 0.9 % IV SOLN
INTRAVENOUS | Status: DC
  Administered 2016-07-06 (×2): via INTRAVENOUS

## 2016-07-06 MED ORDER — NEOMYCIN-POLYMYXIN B GU 40-200000 IR SOLN
Status: AC
  Filled 2016-07-06: qty 4

## 2016-07-06 MED ORDER — CEFAZOLIN SODIUM-DEXTROSE 2-4 GM/100ML-% IV SOLN
2.0000 g | Freq: Four times a day (QID) | INTRAVENOUS | Status: AC
  Administered 2016-07-06 – 2016-07-07 (×2): 2 g via INTRAVENOUS
  Filled 2016-07-06 (×2): qty 100

## 2016-07-06 MED ORDER — BUPIVACAINE-EPINEPHRINE (PF) 0.25% -1:200000 IJ SOLN
INTRAMUSCULAR | Status: AC
  Filled 2016-07-06: qty 30

## 2016-07-06 MED ORDER — DIPHENHYDRAMINE HCL 12.5 MG/5ML PO ELIX: 12.5000 mg | ORAL_SOLUTION | ORAL | Status: DC | PRN

## 2016-07-06 MED ORDER — ONDANSETRON HCL 4 MG/2ML IJ SOLN: 4.0000 mg | Freq: Once | INTRAMUSCULAR | Status: DC | PRN

## 2016-07-06 MED ORDER — ACETAMINOPHEN 650 MG RE SUPP: 650.0000 mg | Freq: Four times a day (QID) | RECTAL | Status: DC | PRN

## 2016-07-06 MED ORDER — ONDANSETRON HCL 4 MG PO TABS: 4.0000 mg | ORAL_TABLET | Freq: Four times a day (QID) | ORAL | Status: DC | PRN

## 2016-07-06 MED ORDER — ACETAMINOPHEN 325 MG PO TABS
650.0000 mg | ORAL_TABLET | Freq: Four times a day (QID) | ORAL | Status: DC | PRN
  Administered 2016-07-07 – 2016-07-09 (×5): 650 mg via ORAL
  Filled 2016-07-06 (×5): qty 2

## 2016-07-06 MED ORDER — MORPHINE SULFATE (PF) 4 MG/ML IV SOLN
INTRAVENOUS | Status: AC
  Administered 2016-07-06: 4 mg via INTRAVENOUS
  Filled 2016-07-06: qty 1

## 2016-07-06 MED ORDER — ENOXAPARIN SODIUM 40 MG/0.4ML ~~LOC~~ SOLN
40.0000 mg | SUBCUTANEOUS | Status: DC
  Administered 2016-07-07 – 2016-07-09 (×3): 40 mg via SUBCUTANEOUS
  Filled 2016-07-06 (×3): qty 0.4

## 2016-07-06 MED ORDER — PHENYLEPHRINE HCL 10 MG/ML IJ SOLN
INTRAMUSCULAR | Status: DC | PRN
  Administered 2016-07-06 (×2): 100 ug via INTRAVENOUS

## 2016-07-06 MED ORDER — BUPIVACAINE HCL (PF) 0.25 % IJ SOLN
INTRAMUSCULAR | Status: AC
  Filled 2016-07-06: qty 30

## 2016-07-06 MED ORDER — DOCUSATE SODIUM 100 MG PO CAPS
100.0000 mg | ORAL_CAPSULE | Freq: Two times a day (BID) | ORAL | Status: DC
  Administered 2016-07-06 – 2016-07-09 (×6): 100 mg via ORAL
  Filled 2016-07-06 (×6): qty 1

## 2016-07-06 MED ORDER — BUPIVACAINE HCL (PF) 0.5 % IJ SOLN
INTRAMUSCULAR | Status: DC | PRN
  Administered 2016-07-06: 3 mL via INTRATHECAL

## 2016-07-06 MED ORDER — PHENOL 1.4 % MT LIQD
1.0000 | OROMUCOSAL | Status: DC | PRN
  Filled 2016-07-06: qty 177

## 2016-07-06 MED ORDER — ONDANSETRON HCL 4 MG/2ML IJ SOLN: 4.0000 mg | Freq: Four times a day (QID) | INTRAMUSCULAR | Status: DC | PRN

## 2016-07-06 SURGICAL SUPPLY — 44 items
BLADE SAW SAG 18.5X105 (BLADE) ×2 IMPLANT
BNDG COHESIVE 6X5 TAN STRL LF (GAUZE/BANDAGES/DRESSINGS) ×4 IMPLANT
CANISTER SUCT 1200ML W/VALVE (MISCELLANEOUS) ×2 IMPLANT
CAPT HIP HEMI 2 ×2 IMPLANT
CATH FOL LEG HOLDER (MISCELLANEOUS) ×2 IMPLANT
CATH TRAY METER 16FR LF (MISCELLANEOUS) ×2 IMPLANT
CHLORAPREP W/TINT 26ML (MISCELLANEOUS) ×2 IMPLANT
DRAPE C-ARM XRAY 36X54 (DRAPES) ×2 IMPLANT
DRAPE INCISE IOBAN 66X60 STRL (DRAPES) IMPLANT
DRAPE POUCH INSTRU U-SHP 10X18 (DRAPES) ×2 IMPLANT
DRAPE SHEET LG 3/4 BI-LAMINATE (DRAPES) ×6 IMPLANT
DRAPE TABLE BACK 80X90 (DRAPES) ×2 IMPLANT
DRSG OPSITE POSTOP 4X8 (GAUZE/BANDAGES/DRESSINGS) ×4 IMPLANT
ELECT BLADE 6.5 EXT (BLADE) ×2 IMPLANT
ELECT REM PT RETURN 9FT ADLT (ELECTROSURGICAL) ×2
ELECTRODE REM PT RTRN 9FT ADLT (ELECTROSURGICAL) ×1 IMPLANT
GLOVE BIOGEL PI IND STRL 9 (GLOVE) ×1 IMPLANT
GLOVE BIOGEL PI INDICATOR 9 (GLOVE) ×1
GLOVE SURG SYN 9.0  PF PI (GLOVE) ×2
GLOVE SURG SYN 9.0 PF PI (GLOVE) ×2 IMPLANT
GOWN SRG 2XL LVL 4 RGLN SLV (GOWNS) ×1 IMPLANT
GOWN STRL NON-REIN 2XL LVL4 (GOWNS) ×1
GOWN STRL REUS W/ TWL LRG LVL3 (GOWN DISPOSABLE) ×1 IMPLANT
GOWN STRL REUS W/TWL LRG LVL3 (GOWN DISPOSABLE) ×1
HEMOVAC 400CC 10FR (MISCELLANEOUS) IMPLANT
HOOD PEEL AWAY FLYTE STAYCOOL (MISCELLANEOUS) ×2 IMPLANT
MAT BLUE FLOOR 46X72 FLO (MISCELLANEOUS) ×2 IMPLANT
NDL SAFETY 18GX1.5 (NEEDLE) ×2 IMPLANT
NEEDLE SPNL 18GX3.5 QUINCKE PK (NEEDLE) ×2 IMPLANT
NS IRRIG 1000ML POUR BTL (IV SOLUTION) ×2 IMPLANT
PACK HIP COMPR (MISCELLANEOUS) ×2 IMPLANT
SOL PREP PVP 2OZ (MISCELLANEOUS) ×2
SOLUTION PREP PVP 2OZ (MISCELLANEOUS) ×1 IMPLANT
STAPLER SKIN PROX 35W (STAPLE) ×2 IMPLANT
STRAP SAFETY BODY (MISCELLANEOUS) ×2 IMPLANT
SUT DVC 2 QUILL PDO  T11 36X36 (SUTURE) ×1
SUT DVC 2 QUILL PDO T11 36X36 (SUTURE) ×1 IMPLANT
SUT SILK 0 (SUTURE) ×1
SUT SILK 0 30XBRD TIE 6 (SUTURE) ×1 IMPLANT
SUT V-LOC 90 ABS DVC 3-0 CL (SUTURE) ×2 IMPLANT
SUT VIC AB 1 CT1 36 (SUTURE) ×2 IMPLANT
SYR 20CC LL (SYRINGE) ×2 IMPLANT
SYR 30ML LL (SYRINGE) ×2 IMPLANT
TOWEL OR 17X26 4PK STRL BLUE (TOWEL DISPOSABLE) ×2 IMPLANT

## 2016-07-06 NOTE — ED Notes (Signed)
AAOx3.  Skin warm and dry.  NAD 

## 2016-07-06 NOTE — Op Note (Signed)
07/05/2016 - 07/06/2016  5:11 PM  PATIENT:  Brandon Calhoun  83 y.o. male  PRE-OPERATIVE DIAGNOSIS:  LEFT HIP FRACTURE  POST-OPERATIVE DIAGNOSIS:  LEFT HIP FRACTURE  PROCEDURE:  Procedure(s): ARTHROPLASTY BIPOLAR HIP (HEMIARTHROPLASTY) (Left)  SURGEON: Brandon SchullerMichael J Shamere Campas, MD  ASSISTANTS: none  ANESTHESIA:   spinal  EBL:  Total I/O In: 325 [I.V.:325] Out: 1100 [Urine:1000; Blood:100]  BLOOD ADMINISTERED:none  DRAINS: none   LOCAL MEDICATIONS USED:  MARCAINE     SPECIMEN:  Source of Specimen:  Left femoral head  DISPOSITION OF SPECIMEN:  PATHOLOGY  COUNTS:  YES  TOURNIQUET:  * No tourniquets in log *  IMPLANTS: Medacta AMIS 5 standard stem, 47 mm bipolar head and S 28 mm head  DICTATION: .Dragon Dictation   The patient was brought to the operating room and after spinal anesthesia was obtained patient was placed on the operative table with the ipsilateral foot into the Medacta attachment, contralateral leg on a well-padded table. C-arm was brought in and preop template x-ray taken. After prepping and draping in usual sterile fashion appropriate patient identification and timeout procedures were completed. Anterior approach to the hip was obtained and centered over the greater trochanter and TFL muscle. The subcutaneous tissue was incised hemostasis being achieved by electrocautery. TFL fascia was incised and the muscle retracted laterally deep retractor placed. The lateral femoral circumflex vessels were identified and ligated. The anterior capsule was exposed and a capsulotomy performed. A separate Until fracture was identified with large hematoma present The neck was identified and a femoral neck cut carried out with a saw. The head was removed without difficulty and showed very minimal degenerative changes to the femoral head and acetabulum. Trials were placed and a 47 mm bipolar head chosen. The leg was then externally rotated and ischiofemoral and pubofemoral releases carried  out. The femur was sequentially broached to a size 5.The 5 standard stem was inserted along with a S 28 mm head and 47 bipolar mm liner. The hip was reduced and was stable the wound was thoroughly irrigated. The deep fascia was closed using a heavy Quill after infiltration of 30 cc of quarter percent Sensorcaine with epinephrine. Skin closed with 3-0 V-LOC by skin staples, Xeroform and honeycomb dressing  PLAN OF CARE: Continue as inpatient

## 2016-07-06 NOTE — Transfer of Care (Signed)
Immediate Anesthesia Transfer of Care Note  Patient: Brandon Calhoun  Procedure(s) Performed: Procedure(s): ARTHROPLASTY BIPOLAR HIP (HEMIARTHROPLASTY) (Left)  Patient Location: PACU  Anesthesia Type:Spinal  Level of Consciousness: awake and patient cooperative  Airway & Oxygen Therapy: Patient Spontanous Breathing and Patient connected to face mask oxygen  Post-op Assessment: Report given to RN and Post -op Vital signs reviewed and stable  Post vital signs: Reviewed and stable  Last Vitals:  Vitals:   07/06/16 1407 07/06/16 1708  BP: (!) 144/73 120/60  Pulse: 82 81  Resp: 18 19  Temp: 37.4 C 36.6 C    Last Pain:  Vitals:   07/06/16 1407  TempSrc: Tympanic  PainSc: 6       Patients Stated Pain Goal: 3 (07/06/16 0803)  Complications: No apparent anesthesia complications

## 2016-07-06 NOTE — Anesthesia Post-op Follow-up Note (Cosign Needed)
Anesthesia QCDR form completed.        

## 2016-07-06 NOTE — [place] FL2 (Signed)
[place] MEDICAID FL2 LEVEL OF CARE SCREENING TOOL     IDENTIFICATION  Patient Name: Brandon Calhoun Birthdate: 1930/03/14 Sex: male Admission Date (Current Location): 07/05/2016  [place]   Facility and Address:  [facility], [address], [place], [place] [address]      Provider Number: [id number]  Attending Physician Name and Address:  Altamese DillingVaibhavkumar Vachhani, MD  Relative Name and Phone Number:       Current Level of Care: Hospital Recommended Level of Care: Skilled Nursing Facility Prior Approval Number:    Date Approved/Denied:   PASRR Number:  [id number])  Discharge Plan: SNF    Current Diagnoses: Patient Active Problem List   Diagnosis Date Noted  . Hip fracture (HCC) 07/06/2016    Orientation RESPIRATION BLADDER Height & Weight     Self, Time, Situation, Place  Normal Continent Weight: 148 lb (67.1 kg) Height:  5\' 9"  (175.3 cm)  BEHAVIORAL SYMPTOMS/MOOD NEUROLOGICAL BOWEL NUTRITION STATUS   (none)  (none) Continent Diet (NPO for surgery )  AMBULATORY STATUS COMMUNICATION OF NEEDS Skin   Extensive Assist Verbally Surgical wounds                       Personal Care Assistance Level of Assistance  Bathing, Feeding, Dressing Bathing Assistance: Limited assistance Feeding assistance: Independent Dressing Assistance: Limited assistance     Functional Limitations Info  Sight, Hearing, Speech Sight Info: Adequate Hearing Info: Adequate Speech Info: Adequate    SPECIAL CARE FACTORS FREQUENCY  PT (By licensed PT), OT (By licensed OT)     PT Frequency:  (5) OT Frequency:  (5)            Contractures      Additional Factors Info  Code Status, Allergies Code Status Info:  (Full Code. ) Allergies Info:  (No Known Allergies. )           Current Medications (07/06/2016):  This is the current hospital active medication list Current Facility-Administered Medications  Medication  Dose Route Frequency Provider Last Rate Last Dose  . 0.9 %  sodium chloride infusion   Intravenous Continuous Ihor AustinPavan Pyreddy, MD 75 mL/hr at 07/06/16 1100    . acetaminophen (TYLENOL) tablet 650 mg  650 mg Oral Q6H PRN Ihor AustinPavan Pyreddy, MD       Or  . acetaminophen (TYLENOL) suppository 650 mg  650 mg Rectal Q6H PRN Pavan Pyreddy, MD      . artificial tears (LACRILUBE) ophthalmic ointment 1 application  1 application Both Eyes PRN Pavan Pyreddy, MD      . ceFAZolin (ANCEF) IVPB 1 g/50 mL premix  1 g Intravenous Once Kennedy BuckerMichael Menz, MD      . famotidine (PEPCID) tablet 10 mg  10 mg Oral Daily Ihor AustinPavan Pyreddy, MD   10 mg at 07/06/16 1023  . HYDROcodone-acetaminophen (NORCO/VICODIN) 5-325 MG per tablet 1-2 tablet  1-2 tablet Oral Q4H PRN Ihor AustinPavan Pyreddy, MD   1 tablet at 07/06/16 0811  . lisinopril (PRINIVIL,ZESTRIL) tablet 10 mg  10 mg Oral Daily Ihor AustinPavan Pyreddy, MD   10 mg at 07/06/16 1023  . mometasone-formoterol (DULERA) 200-5 MCG/ACT inhaler 2 puff  2 puff Inhalation BID Pavan Pyreddy, MD      . morphine 2 MG/ML injection 2 mg  2 mg Intravenous Q4H PRN Ihor AustinPavan Pyreddy, MD   2 mg at 07/06/16 0857  . ondansetron (ZOFRAN) tablet 4 mg  4 mg Oral Q6H PRN Ihor AustinPavan Pyreddy, MD  Or  . ondansetron (ZOFRAN) injection 4 mg  4 mg Intravenous Q6H PRN Pavan Pyreddy, MD      . senna-docusate (Senokot-S) tablet 1 tablet  1 tablet Oral QHS PRN Ihor Austin, MD         Discharge Medications: Please see discharge summary for a list of discharge medications.  Relevant Imaging Results:  Relevant Lab Results:   Additional Information  (SSN: [id number])  Sample, Darleen Crocker, LCSW

## 2016-07-06 NOTE — H&P (Addendum)
[facility] Physicians -  at [facility]   PATIENT NAME: Brandon Calhoun    MR#:  [id number]  DATE OF BIRTH:  1930-03-25  DATE OF ADMISSION:  07/05/2016  PRIMARY CARE PHYSICIAN: [facility]   REQUESTING/REFERRING PHYSICIAN:   CHIEF COMPLAINT:   Chief Complaint  Patient presents with  . Fall    HISTORY OF PRESENT ILLNESS: Brandon Calhoun  is a 83 y.o. male with a known history of Bronchial asthma, hypertension, GERD presented to the emergency room for fall. Patient lives in assisted living facility in the apartment. He lost balance and fell down at 9:30 PM yesterday night. No history of any loss of consciousness, head injury. Patient fell on his left hip and had pain in the left hip area. Patient was brought to the emergency room and was evaluated with x-ray of the hip which showed left femoral neck fracture. Patient was given pain medication in the emergency room to control the hip pain. Case was discussed with orthopedic surgeon by ER physician who recommended surgery. No complaints of any chest pain, shortness of breath. No fever or chills and cough. Patient is independent in activities of daily living. Patient does not use a cane or a walker and still ambulates independently. Hospitalist service was consulted for further care of the patient. The pain in the hip is aching in nature initially was 7 out of 10 on a scale of 1-10. The pain was relieved when IV morphine medication was given in the emergency room.  PAST MEDICAL HISTORY:   Past Medical History:  Diagnosis Date  . Asthma   . Edema    MILD OF FEET  . Edema    MILD OF FEET  . GERD (gastroesophageal reflux disease)   . Hypertension     PAST SURGICAL HISTORY: Past Surgical History:  Procedure Laterality Date  . CATARACT EXTRACTION W/PHACO Left 02/16/2016   Procedure: CATARACT EXTRACTION PHACO AND INTRAOCULAR LENS PLACEMENT (IOC);  Surgeon: Nevada Crane, MD;  Location: [facility] ORS;  Service:  Ophthalmology;  Laterality: Left;  [place] 1.10 AP% 10.6 CDE 7.41 Fluid Pack Lot # [id number]  . CATARACT EXTRACTION W/PHACO Right 03/08/2016   Procedure: CATARACT EXTRACTION PHACO AND INTRAOCULAR LENS PLACEMENT (IOC);  Surgeon: Nevada Crane, MD;  Location: [facility] ORS;  Service: Ophthalmology;  Laterality: Right;  [place] 1.14 AP% 11.6 CDE 11.80 Fluid Pack Lot # [id number]  . HERNIA REPAIR      SOCIAL HISTORY:  Social History  Substance Use Topics  . Smoking status: Never Smoker  . Smokeless tobacco: Never Used  . Alcohol use No    FAMILY HISTORY:  Family History  Problem Relation Age of Onset  . Hypertension Neg Hx   . Diabetes Neg Hx   . Heart disease Neg Hx   Mother and father Deceased.  DRUG ALLERGIES: No Known Allergies  REVIEW OF SYSTEMS:   CONSTITUTIONAL: No fever, fatigue or weakness.  EYES: No blurred or double vision.  EARS, NOSE, AND THROAT: No tinnitus or ear pain.  RESPIRATORY: No cough, shortness of breath, wheezing or hemoptysis.  CARDIOVASCULAR: No chest pain, orthopnea, edema.  GASTROINTESTINAL: No nausea, vomiting, diarrhea or abdominal pain.  GENITOURINARY: No dysuria, hematuria.  ENDOCRINE: No polyuria, nocturia,  HEMATOLOGY: No anemia, easy bruising or bleeding SKIN: No rash or lesion. MUSCULOSKELETAL: Has left hip pain.   NEUROLOGIC: No tingling, numbness, weakness.  PSYCHIATRY: No anxiety or depression.   MEDICATIONS AT HOME:  Prior to Admission medications   Medication  Sig Start Date End Date Taking? Authorizing Provider  acetaminophen (TYLENOL) 325 MG tablet Take 650 mg by mouth every 6 (six) hours as needed.   Yes Historical Provider, MD  alum & mag hydroxide-simeth (MAALOX/MYLANTA) 200-200-20 MG/5ML suspension Take 10 mLs by mouth as needed for indigestion or heartburn.   Yes Historical Provider, MD  Artificial Tear Ointment (ARTIFICIAL TEARS) ointment Place 1 application into both eyes as needed.   Yes Historical Provider, MD  bismuth subsalicylate  (PEPTO BISMOL) 262 MG/15ML suspension Take 10 mLs by mouth 3 (three) times daily as needed.   Yes Historical Provider, MD  budesonide-formoterol (SYMBICORT) 160-4.5 MCG/ACT inhaler Inhale 2 puffs into the lungs 2 (two) times daily.   Yes Historical Provider, MD  carbamide peroxide (DEBROX) 6.5 % otic solution Place 5 drops into both ears as needed.   Yes Historical Provider, MD  clobetasol ointment (TEMOVATE) 0.05 % Apply 1 application topically 2 (two) times daily. AS NEEDED   Yes Historical Provider, MD  diphenhydrAMINE (BENADRYL) 25 MG tablet Take 25 mg by mouth every 4 (four) hours as needed.   Yes Historical Provider, MD  guaiFENesin-dextromethorphan (ROBITUSSIN DM) 100-10 MG/5ML syrup Take 10 mLs by mouth every 4 (four) hours as needed for cough.   Yes Historical Provider, MD  lisinopril (PRINIVIL,ZESTRIL) 10 MG tablet Take 10 mg by mouth daily.   Yes Historical Provider, MD  magnesium hydroxide (MILK OF MAGNESIA) 400 MG/5ML suspension Take 10 mLs by mouth daily as needed for mild constipation.   Yes Historical Provider, MD  nystatin (NYSTATIN) powder Apply 1 g topically 2 (two) times daily as needed.   Yes Historical Provider, MD  ranitidine (ZANTAC) 300 MG capsule Take 300 mg by mouth every evening.   Yes Historical Provider, MD      PHYSICAL EXAMINATION:   VITAL SIGNS: Blood pressure (!) 149/72, pulse 99, temperature 98.1 F (36.7 C), temperature source Oral, resp. rate 15, height 5\' 9"  (1.753 m), weight 67.1 kg (148 lb), SpO2 95 %.  GENERAL:  83 y.o.-year-old patient lying in the bed with no acute distress.  EYES: Pupils equal, round, reactive to light and accommodation. No scleral icterus. Extraocular muscles intact.  HEENT: Head atraumatic, normocephalic. Oropharynx dry and nasopharynx clear.  NECK:  Supple, no jugular venous distention. No thyroid enlargement, no tenderness.  LUNGS: Normal breath sounds bilaterally, no wheezing, rales,rhonchi or crepitation. No use of accessory  muscles of respiration.  CARDIOVASCULAR: S1, S2 normal. No murmurs, rubs, or gallops.  ABDOMEN: Soft, nontender, nondistended. Bowel sounds present. No organomegaly or mass.  EXTREMITIES: No pedal edema, cyanosis, or clubbing.  Left hip swelling and tenderness noted. NEUROLOGIC: Cranial nerves II through XII are intact. Muscle strength 5/5 in all extremities. Sensation intact. Gait not checked.  PSYCHIATRIC: The patient is alert and oriented x 3.  SKIN: No obvious rash, lesion, or ulcer.   LABORATORY PANEL:   CBC  Recent Labs Lab 07/06/16 0015  WBC 12.6*  HGB 12.5*  HCT 36.2*  PLT 252  MCV 90.6  MCH 31.4  MCHC 34.6  RDW 14.1  LYMPHSABS 1.3  MONOABS 0.9  EOSABS 0.1  BASOSABS 0.0   ------------------------------------------------------------------------------------------------------------------  Chemistries   Recent Labs Lab 07/06/16 0015  NA 134*  K 4.2  CL 105  CO2 23  GLUCOSE 137*  BUN 23*  CREATININE 0.81  CALCIUM 8.6*  AST 23  ALT 23  ALKPHOS 69  BILITOT 0.8   ------------------------------------------------------------------------------------------------------------------ estimated creatinine clearance is 64.4 mL/min (by C-G formula based on  SCr of 0.81 mg/dL). ------------------------------------------------------------------------------------------------------------------ No results for input(s): TSH, T4TOTAL, T3FREE, THYROIDAB in the last 72 hours.  Invalid input(s): FREET3   Coagulation profile  Recent Labs Lab 07/06/16 0015  INR 1.05   ------------------------------------------------------------------------------------------------------------------- No results for input(s): DDIMER in the last 72 hours. -------------------------------------------------------------------------------------------------------------------  Cardiac Enzymes No results for input(s): CKMB, TROPONINI, MYOGLOBIN in the last 168 hours.  Invalid input(s):  CK ------------------------------------------------------------------------------------------------------------------ Invalid input(s): POCBNP  ---------------------------------------------------------------------------------------------------------------  Urinalysis No results found for: COLORURINE, APPEARANCEUR, LABSPEC, PHURINE, GLUCOSEU, HGBUR, BILIRUBINUR, KETONESUR, PROTEINUR, UROBILINOGEN, NITRITE, LEUKOCYTESUR   RADIOLOGY: Dg Chest 1 View  Result Date: 07/06/2016 CLINICAL DATA:  Fall EXAM: CHEST 1 VIEW COMPARISON:  None. FINDINGS: The heart size and mediastinal contours are within normal limits. Both lungs are clear. The visualized skeletal structures are unremarkable. IMPRESSION: No active disease. Electronically Signed   By: Jasmine PangKim  Fujinaga M.D.   On: 07/06/2016 00:23   Dg Hip Unilat With Pelvis 2-3 Views Left  Result Date: 07/06/2016 CLINICAL DATA:  Fall with left hip pain. Leg shortening and rotation. EXAM: DG HIP (WITH OR WITHOUT PELVIS) 2-3V LEFT COMPARISON:  None. FINDINGS: Displaced left femoral neck fracture with foreshortening of the femoral shaft. No significant angulation. Femoral head is seated in the acetabulum. No additional fracture of the bony pelvis. Multiple tacks from prior left inguinal hernia repair. IMPRESSION: Displaced left femoral neck fracture with foreshortening. Electronically Signed   By: Rubye OaksMelanie  Ehinger M.D.   On: 07/06/2016 00:23    EKG: Orders placed or performed during the hospital encounter of 07/05/16  . EKG 12-Lead  . EKG 12-Lead  . ED EKG  . ED EKG    IMPRESSION AND PLAN: 83 year old elderly male patient with history of hypertension, bronchial asthma, GERD presented to the emergency room with fall and pain in the left hip. Admitting diagnosis 1. Left femoral neck fracture 2. Dehydration 3. Hyponatremia 4. Leukocytosis 5. Accidental fall 6. Hypertension Treatment plan Admit patient to medical floor inpatient service IV fluid hydration  for dehydration Follow-up sodium level Orthopedic surgery consultation for left femoral neck fracture DVT prophylaxis sequential compression device to lower extremities Resume lisinopril for hypertension Left hip pain management with oral Norco and when necessary morphine IV No evidence of any infection, will follow-up WBC count Supportive care. All the records are reviewed and case discussed with ED provider. Management plans discussed with the patient, family and they are in agreement.  CODE STATUS:FULL CODE Surrogate decision maker : daughter Code Status History    This patient does not have a recorded code status. Please follow your organizational policy for patients in this situation.    Advance Directive Documentation   Flowsheet Row Most Recent Value  Type of Advance Directive  Healthcare Power of Attorney  Pre-existing out of facility DNR order (yellow form or pink MOST form)  No data  "MOST" Form in Place?  No data       TOTAL TIME TAKING CARE OF THIS PATIENT: 50 minutes.    Ihor AustinPavan Edel Rivero M.D on 07/06/2016 at 3:30 AM  Between 7am to 6pm - Pager - [phone]  After 6pm go to [email or web address] - password [email or web address] [facility]  [facility]  Office  [phone]  CC: Primary care physician; [facility]

## 2016-07-06 NOTE — Anesthesia Postprocedure Evaluation (Signed)
Anesthesia Post Note  Patient: Brandon Calhoun  Procedure(s) Performed: Procedure(s) (LRB): ARTHROPLASTY BIPOLAR HIP (HEMIARTHROPLASTY) (Left)  Patient location during evaluation: PACU Anesthesia Type: Spinal Level of consciousness: oriented and awake and alert Pain management: pain level controlled Vital Signs Assessment: post-procedure vital signs reviewed and stable Respiratory status: spontaneous breathing, respiratory function stable and patient connected to nasal cannula oxygen Cardiovascular status: blood pressure returned to baseline and stable Postop Assessment: no headache, no backache and spinal receding Anesthetic complications: no     Last Vitals:  Vitals:   07/06/16 1708 07/06/16 1723  BP: 120/60 126/61  Pulse: 81 76  Resp: 19 14  Temp: 36.6 C     Last Pain:  Vitals:   07/06/16 1407  TempSrc: Tympanic  PainSc: 6                  Yevette EdwardsJames G Adams

## 2016-07-06 NOTE — Progress Notes (Signed)
[facility] - [place] at [facility]   PATIENT NAME: Brandon Calhoun    MR#:  [id number]  DATE OF BIRTH:  09-23-1929  SUBJECTIVE:  CHIEF COMPLAINT:   Chief Complaint  Patient presents with  . Fall   Came with a fall and fracture hip, seen after surgery, no complains except numb both legs after getting spinal anesthesia.  REVIEW OF SYSTEMS:  CONSTITUTIONAL: No fever, fatigue or weakness.  EYES: No blurred or double vision.  EARS, NOSE, AND THROAT: No tinnitus or ear pain.  RESPIRATORY: No cough, shortness of breath, wheezing or hemoptysis.  CARDIOVASCULAR: No chest pain, orthopnea, edema.  GASTROINTESTINAL: No nausea, vomiting, diarrhea or abdominal pain.  GENITOURINARY: No dysuria, hematuria.  ENDOCRINE: No polyuria, nocturia,  HEMATOLOGY: No anemia, easy bruising or bleeding SKIN: No rash or lesion. MUSCULOSKELETAL: No joint pain or arthritis.   NEUROLOGIC: No tingling, numbness, weakness.  PSYCHIATRY: No anxiety or depression.   ROS  DRUG ALLERGIES:  No Known Allergies  VITALS:  Blood pressure (!) 125/55, pulse 82, temperature 97.9 F (36.6 C), temperature source Oral, resp. rate 18, height 5\' 9"  (1.753 m), weight 67.1 kg (148 lb), SpO2 93 %.  PHYSICAL EXAMINATION:  GENERAL:  83 y.o.-year-old patient lying in the bed with no acute distress.  EYES: Pupils equal, round, reactive to light and accommodation. No scleral icterus. Extraocular muscles intact.  HEENT: Head atraumatic, normocephalic. Oropharynx and nasopharynx clear.  NECK:  Supple, no jugular venous distention. No thyroid enlargement, no tenderness.  LUNGS: Normal breath sounds bilaterally, no wheezing, rales,rhonchi or crepitation. No use of accessory muscles of respiration.  CARDIOVASCULAR: S1, S2 normal. No murmurs, rubs, or gallops.  ABDOMEN: Soft, nontender, nondistended. Bowel sounds present. No organomegaly or mass.  EXTREMITIES: No pedal edema, cyanosis, or clubbing.  NEUROLOGIC:  Cranial nerves II through XII are intact. Muscle strength 5/5 in upper extremities. Not able to move lower extremities as he received spinal anesthesia and just came back in recovery room when I examined at 5 PM. Sensation intact. Gait not checked.  PSYCHIATRIC: The patient is alert and oriented x 3.  SKIN: No obvious rash, lesion, or ulcer.   Physical Exam LABORATORY PANEL:   CBC  Recent Labs Lab 07/06/16 0447  WBC 13.0*  HGB 12.0*  HCT 34.2*  PLT 243   ------------------------------------------------------------------------------------------------------------------  Chemistries   Recent Labs Lab 07/06/16 0015 07/06/16 0447  NA 134* 132*  K 4.2 4.1  CL 105 104  CO2 23 22  GLUCOSE 137* 119*  BUN 23* 21*  CREATININE 0.81 0.66  CALCIUM 8.6* 8.1*  AST 23  --   ALT 23  --   ALKPHOS 69  --   BILITOT 0.8  --    ------------------------------------------------------------------------------------------------------------------  Cardiac Enzymes No results for input(s): TROPONINI in the last 168 hours. ------------------------------------------------------------------------------------------------------------------  RADIOLOGY:  Dg Chest 1 View  Result Date: 07/06/2016 CLINICAL DATA:  Fall EXAM: CHEST 1 VIEW COMPARISON:  None. FINDINGS: The heart size and mediastinal contours are within normal limits. Both lungs are clear. The visualized skeletal structures are unremarkable. IMPRESSION: No active disease. Electronically Signed   By: Jasmine Pang M.D.   On: 07/06/2016 00:23   Dg Hip Operative Unilat With Pelvis Left  Result Date: 07/06/2016 CLINICAL DATA:  Left hip arthroplasty. EXAM: OPERATIVE LEFT HIP (WITH PELVIS IF PERFORMED) 1 VIEW TECHNIQUE: Fluoroscopic spot image(s) were submitted for interpretation post-operatively. COMPARISON:  07/05/2016 preoperative study FINDINGS: A total of 21 seconds of fluoroscopic time was utilized during placement  of a left total hip  arthroplasty with uncemented femoral component. Fine bony detail is limited by the C-arm fluoroscopic technique. Hernia repair tacks are seen overlying the left hip. Alignment appears near anatomic for a single frontal view. IMPRESSION: Left uncemented total hip arthroplasty. 21 seconds of fluoroscopic time utilized during the procedure. Electronically Signed   By: Tollie Ethavid  Kwon M.D.   On: 07/06/2016 17:11   Dg Hip Unilat W Or W/o Pelvis 2-3 Views Left  Result Date: 07/06/2016 CLINICAL DATA:  Postop bipolar hip arthroplasty EXAM: DG HIP (WITH OR WITHOUT PELVIS) 2-3V LEFT COMPARISON:  07/05/2016 preoperative study FINDINGS: New uncemented bipolar left hip arthroplasty in anatomic position with postoperative subcutaneous emphysema and overlying skin staples noted. No acute postoperative fracture nor hardware failure. Native right hip is unremarkable. Foley catheter is partially imaged. IMPRESSION: New left uncemented bipolar hip arthroplasty. Electronically Signed   By: Tollie Ethavid  Kwon M.D.   On: 07/06/2016 17:32   Dg Hip Unilat With Pelvis 2-3 Views Left  Result Date: 07/06/2016 CLINICAL DATA:  Fall with left hip pain. Leg shortening and rotation. EXAM: DG HIP (WITH OR WITHOUT PELVIS) 2-3V LEFT COMPARISON:  None. FINDINGS: Displaced left femoral neck fracture with foreshortening of the femoral shaft. No significant angulation. Femoral head is seated in the acetabulum. No additional fracture of the bony pelvis. Multiple tacks from prior left inguinal hernia repair. IMPRESSION: Displaced left femoral neck fracture with foreshortening. Electronically Signed   By: Rubye OaksMelanie  Ehinger M.D.   On: 07/06/2016 00:23    ASSESSMENT AND PLAN:   Principal Problem:   Hip fracture (HCC)  83 year old elderly male patient with history of hypertension, bronchial asthma, GERD presented to the emergency room with fall and pain in the left hip.  1. Left femoral neck fracture 2. Dehydration 3. Hyponatremia 4. Leukocytosis 5.  Accidental fall 6. Hypertension Treatment plan  IV fluid hydration for dehydration Follow-up sodium level Orthopedic surgery consultation for left femoral neck fracture- s/p surgery 07/06/16. DVT prophylaxis sequential compression device to lower extremities Resume lisinopril for hypertension Left hip pain management with oral Norco and when necessary morphine IV No evidence of any infection, will follow-up WBC count Supportive care. Management plans discussed with the patient, family and they are in agreement.   All the records are reviewed and case discussed with Care Management/Social Workerr. Management plans discussed with the patient, family and they are in agreement.  CODE STATUS: Full  TOTAL TIME TAKING CARE OF THIS PATIENT: 35 minutes.     POSSIBLE D/C IN 2-3 DAYS, DEPENDING ON CLINICAL CONDITION.   Altamese DillingVACHHANI, Terea Neubauer M.D on 07/06/2016   Between 7am to 6pm - Pager - [phone]  After 6pm go to [email or web address] - password [email or web address]  [facility] [place] [facility]  Office  [phone]  CC: Primary care physician; [facility]  Note: This dictation was prepared with Dragon dictation along with smaller phrase technology. Any transcriptional errors that result from this process are unintentional.

## 2016-07-06 NOTE — ED Provider Notes (Signed)
[facility] Emergency Department Provider Note  ____________________________________________   First MD Initiated Contact with Patient 07/06/16 0012     (approximate)  I have reviewed the triage vital signs and the nursing notes.   HISTORY  Chief Complaint Fall    HPI Brandon Calhoun is a 83 y.o. male with no significant past medical history and who is quite healthy for his age who presents by EMS for evaluation of acute onset left hip pain status post mechanical fall.  He states he was walking back to the bedroom after going to the kitchen for a drink of water and he slipped and fell.  He did not lose consciousness and did not strike his head.  His pain is severe and is a sharp stabbing pain in the left hip.  He has no numbness or tingling distally in the left leg and is able to wiggle his toes.  The left leg is externally rotated and slightly shortened upon arrival.  Movement of the leg makes it worse and resting still makes it slightly better.  He denies headache, neck pain, chest pain, abdominal pain, nausea, vomiting, dysuria. The patient takes no blood thinners.   Past Medical History:  Diagnosis Date  . Asthma   . Edema    MILD OF FEET  . Edema    MILD OF FEET  . GERD (gastroesophageal reflux disease)   . Hypertension     There are no active problems to display for this patient.   Past Surgical History:  Procedure Laterality Date  . CATARACT EXTRACTION W/PHACO Left 02/16/2016   Procedure: CATARACT EXTRACTION PHACO AND INTRAOCULAR LENS PLACEMENT (IOC);  Surgeon: Nevada Crane, MD;  Location: [facility] ORS;  Service: Ophthalmology;  Laterality: Left;  [place] 1.10 AP% 10.6 CDE 7.41 Fluid Pack Lot # [id number]  . CATARACT EXTRACTION W/PHACO Right 03/08/2016   Procedure: CATARACT EXTRACTION PHACO AND INTRAOCULAR LENS PLACEMENT (IOC);  Surgeon: Nevada Crane, MD;  Location: [facility] ORS;  Service: Ophthalmology;  Laterality: Right;  [place] 1.14 AP%  11.6 CDE 11.80 Fluid Pack Lot # [id number]  . HERNIA REPAIR      Prior to Admission medications   Medication Sig Start Date End Date Taking? Authorizing Provider  budesonide-formoterol (SYMBICORT) 160-4.5 MCG/ACT inhaler Inhale 2 puffs into the lungs 2 (two) times daily.    Historical Provider, MD  clobetasol ointment (TEMOVATE) 0.05 % Apply 1 application topically 2 (two) times daily. AS NEEDED    Historical Provider, MD  lisinopril (PRINIVIL,ZESTRIL) 10 MG tablet Take 10 mg by mouth daily.    Historical Provider, MD  ranitidine (ZANTAC) 300 MG capsule Take 300 mg by mouth every evening.    Historical Provider, MD    Allergies Patient has no known allergies.  No family history on file.  Social History Social History  Substance Use Topics  . Smoking status: Never Smoker  . Smokeless tobacco: Never Used  . Alcohol use No    Review of Systems Constitutional: No fever/chills Eyes: No visual changes. ENT: No sore throat. Cardiovascular: Denies chest pain. Respiratory: Denies shortness of breath. Gastrointestinal: No abdominal pain.  No nausea, no vomiting.  No diarrhea.  No constipation. Genitourinary: Negative for dysuria. Musculoskeletal: Severe pain in left hip status post fall.  Negative for back and neck pain Skin: Negative for rash. Neurological: Negative for headaches, focal weakness or numbness.  10-point ROS otherwise negative.  ____________________________________________   PHYSICAL EXAM:  VITAL SIGNS: ED Triage Vitals  Enc Vitals Group  BP 07/05/16 2341 (!) 157/76     Pulse Rate 07/05/16 2341 92     Resp 07/05/16 2341 18     Temp 07/05/16 2341 98.1 F (36.7 C)     Temp Source 07/05/16 2341 Oral     SpO2 07/05/16 2339 96 %     Weight 07/05/16 2345 148 lb (67.1 kg)     Height 07/05/16 2345 5\' 9"  (1.753 m)     Head Circumference --      Peak Flow --      Pain Score 07/05/16 2346 8     Pain Loc --      Pain Edu? --      Excl. in GC? --      Constitutional: Alert and oriented. Well appearing and in no acute distress. Eyes: Conjunctivae are normal. PERRL. EOMI. Head: Atraumatic. Nose: No congestion/rhinnorhea. Mouth/Throat: Mucous membranes are moist.  Oropharynx non-erythematous. Neck: No stridor.  No meningeal signs.  No cervical spine tenderness to palpation. Cardiovascular: Normal rate, regular rhythm. Good peripheral circulation. Grossly normal heart sounds. Respiratory: Normal respiratory effort.  No retractions. Lungs CTAB. Gastrointestinal: Soft and nontender. No distention.  Musculoskeletal: Externally rotated and slightly shortened left lower leg.  Sensation.  Severe pain with any attempted passive movement of the left hip. Neurologic:  Normal speech and language. No gross focal neurologic deficits are appreciated.  Left Lower extremity is neurovascularly intact. Skin:  Skin is warm, dry and intact. No rash noted. Psychiatric: Mood and affect are normal. Speech and behavior are normal.  ____________________________________________   LABS (all labs ordered are listed, but only abnormal results are displayed)  Labs Reviewed  COMPREHENSIVE METABOLIC PANEL - Abnormal; Notable for the following:       Result Value   Sodium 134 (*)    Glucose, Bld 137 (*)    BUN 23 (*)    Calcium 8.6 (*)    All other components within normal limits  CBC WITH DIFFERENTIAL/PLATELET - Abnormal; Notable for the following:    WBC 12.6 (*)    RBC 4.00 (*)    Hemoglobin 12.5 (*)    HCT 36.2 (*)    Neutro Abs 10.3 (*)    All other components within normal limits  PROTIME-INR  TYPE AND SCREEN   ____________________________________________  EKG  ED ECG REPORT I, Luiscarlos Kaczmarczyk, the attending physician, personally viewed and interpreted this ECG.  Date: 07/05/2016 EKG Time: 23:42 Rate: 92 Rhythm: normal sinus rhythm QRS Axis: normal Intervals: RBBB and LAFB ST/T Wave abnormalities: Non-specific ST segment / T-wave changes,  but no evidence of acute ischemia. Conduction Disturbances: none Narrative Interpretation: unremarkable  ____________________________________________  RADIOLOGY   Dg Chest 1 View  Result Date: 07/06/2016 CLINICAL DATA:  Fall EXAM: CHEST 1 VIEW COMPARISON:  None. FINDINGS: The heart size and mediastinal contours are within normal limits. Both lungs are clear. The visualized skeletal structures are unremarkable. IMPRESSION: No active disease. Electronically Signed   By: Jasmine Pang M.D.   On: 07/06/2016 00:23   Dg Hip Unilat With Pelvis 2-3 Views Left  Result Date: 07/06/2016 CLINICAL DATA:  Fall with left hip pain. Leg shortening and rotation. EXAM: DG HIP (WITH OR WITHOUT PELVIS) 2-3V LEFT COMPARISON:  None. FINDINGS: Displaced left femoral neck fracture with foreshortening of the femoral shaft. No significant angulation. Femoral head is seated in the acetabulum. No additional fracture of the bony pelvis. Multiple tacks from prior left inguinal hernia repair. IMPRESSION: Displaced left femoral neck fracture with foreshortening. Electronically  Signed   By: Rubye OaksMelanie  Ehinger M.D.   On: 07/06/2016 00:23    ____________________________________________   PROCEDURES  Procedure(s) performed:   Procedures   Critical Care performed: No ____________________________________________   INITIAL IMPRESSION / ASSESSMENT AND PLAN / ED COURSE  Pertinent labs & imaging results that were available during my care of the patient were reviewed by me and considered in my medical decision making (see chart for details).  Closed femoral neck fracture due to mechanical fall.  Well-appearing, no neurovascular compromise, normal vital signs.  Performing standard preoperative workup.  I spoke by phone with Dr. Rosita KeaMenz who plans to operate in the afternoon (it is currently just after midnight).  Updated family.  Will admit to hospitalist for further management.         ____________________________________________  FINAL CLINICAL IMPRESSION(S) / ED DIAGNOSES  Final diagnoses:  Closed displaced fracture of left femoral neck (HCC)     MEDICATIONS GIVEN DURING THIS VISIT:  Medications  morphine 2 MG/ML injection 2 mg (2 mg Intravenous Given 07/05/16 2354)  morphine 2 MG/ML injection 2 mg (2 mg Intravenous Given 07/06/16 0046)     NEW OUTPATIENT MEDICATIONS STARTED DURING THIS VISIT:  New Prescriptions   No medications on file    Modified Medications   No medications on file    Discontinued Medications   No medications on file     Note:  This document was prepared using Dragon voice recognition software and may include unintentional dictation errors.    Loleta Roseory Roda Lauture, MD 07/06/16 [phone]

## 2016-07-06 NOTE — ED Notes (Signed)
Nurse repositioned pt and gave pt another pillow. Pt stating that he feels much more comfortable at this time.

## 2016-07-06 NOTE — Consult Note (Addendum)
Displaced femoral neck fracture, plan hip hemiarthroplasty later today Patient is in assisted living at [facility] but walks without assistive devices. He denies protein droll or pre-existing hip pain or arthritis. He suffered a fall in his apartment. His x-rays show displaced fracture without significant osteoarthritis. On exam he has trace dorsalis pedis and posterior tibial pulses were 2+ edema in the ankles. Leg is shortened and externally rotated  Impression is displaced femoral neck fracture  Plan is for left hip hemiarthroplasty via anterior approach

## 2016-07-06 NOTE — Progress Notes (Signed)
Pt left for the OR at this time, received morphine prior to leaving for escalating pain. Report given to SDS by this Clinical research associatewriter.

## 2016-07-06 NOTE — Anesthesia Preprocedure Evaluation (Signed)
Anesthesia Evaluation  Patient identified by MRN, date of birth, ID band Patient awake    Reviewed: Allergy & Precautions, H&P , NPO status , Patient's Chart, lab work & pertinent test results, reviewed documented beta blocker date and time   Airway Mallampati: II   Neck ROM: full    Dental  (+) Poor Dentition, Teeth Intact   Pulmonary neg pulmonary ROS, asthma ,    Pulmonary exam normal        Cardiovascular hypertension, negative cardio ROS Normal cardiovascular exam Rhythm:regular Rate:Normal     Neuro/Psych negative neurological ROS  negative psych ROS   GI/Hepatic negative GI ROS, Neg liver ROS, GERD  Medicated,  Endo/Other  negative endocrine ROS  Renal/GU negative Renal ROS  negative genitourinary   Musculoskeletal   Abdominal   Peds  Hematology negative hematology ROS (+)   Anesthesia Other Findings Past Medical History: No date: Asthma No date: Edema     Comment: MILD OF FEET No date: Edema     Comment: MILD OF FEET No date: GERD (gastroesophageal reflux disease) No date: Hypertension Past Surgical History: 02/16/2016: CATARACT EXTRACTION W/PHACO Left     Comment: Procedure: CATARACT EXTRACTION PHACO AND               INTRAOCULAR LENS PLACEMENT (IOC);  Surgeon:               Nevada CraneBradley Mark King, MD;  Location: [facility] ORS;                Service: Ophthalmology;  Laterality: Left;  US               1.10 AP% 10.6 CDE 7.41 Fluid Pack Lot #               [id number] 03/08/2016: CATARACT EXTRACTION W/PHACO Right     Comment: Procedure: CATARACT EXTRACTION PHACO AND               INTRAOCULAR LENS PLACEMENT (IOC);  Surgeon:               Nevada CraneBradley Mark King, MD;  Location: [facility] ORS;                Service: Ophthalmology;  Laterality: Right;  US              1.14 AP% 11.6 CDE 11.80 Fluid Pack Lot #               [id number] No date: HERNIA REPAIR BMI    Body Mass Index:  21.86 kg/m     Reproductive/Obstetrics negative OB ROS                             Anesthesia Physical Anesthesia Plan  ASA: III and emergent  Anesthesia Plan: General   Post-op Pain Management:    Induction:   Airway Management Planned:   Additional Equipment:   Intra-op Plan:   Post-operative Plan:   Informed Consent: I have reviewed the patients History and Physical, chart, labs and discussed the procedure including the risks, benefits and alternatives for the proposed anesthesia with the patient or authorized representative who has indicated his/her understanding and acceptance.   Dental Advisory Given  Plan Discussed with: CRNA  Anesthesia Plan Comments:         Anesthesia Quick Evaluation

## 2016-07-06 NOTE — ED Notes (Signed)
Pt. returned from XR. 

## 2016-07-06 NOTE — Care Management Note (Signed)
Case Management Note  Patient Details  Name: Brandon Calhoun MRN: [id number] Date of Birth: 08/18/1929  Subjective/Objective:  Patient is from [facility]. For hemiarthroplasty today.  Patient would like to go to [facility] at [place] for rehab. CSW aware.                 Action/Plan:   Expected Discharge Date:                  Expected Discharge Plan:  Skilled Nursing Facility  In-House Referral:  Clinical Social Work  Discharge planning Services  CM Consult  Post Acute Care Choice:    Choice offered to:     DME Arranged:    DME Agency:     HH Arranged:    HH Agency:     Status of Service:  In process, will continue to follow  If discussed at Long Length of Stay Meetings, dates discussed:    Additional Comments:  Marily MemosLisa M Nylee Barbuto, RN 07/06/2016, 11:09 AM

## 2016-07-06 NOTE — ED Notes (Signed)
Pt's family given a recliner, blanket, and pillow. Lights have been turned down low. Pt and family member are resting comfortably at this time.

## 2016-07-06 NOTE — Progress Notes (Signed)
Pt arrived via stretcher from ER at [phone], pt was transferred to bed by staff, tolerated well. Pt rating pain at 6/10 on arrival, received morphine iv  For pain.pt is alert and oriented x4, is wearing glasses, lungs are clear, pt is on room air. Hr is regular, abdomen is soft, bs heard. Pt has foley catheter intact and draining clear yellow urine,ppp, pt has some soft edema to bilat LE's that caused his socks to leave lines on his legs. piv #20 intact to L arm, iv ns infusing at 5675mls/hr, site is free of redness and swelling.Dr. Rosita KeaMenz in to see tp at 0900, consents secured, pcr sent. SIL present as well. Pt is oriented to room and call bell. Hob is elevated, SR x2, bell in reach.

## 2016-07-06 NOTE — Clinical Social Work Note (Signed)
Clinical Social Work Assessment  Patient Details  Name: Brandon Calhoun MRN: [id number] Date of Birth: [birth year]  Date of referral:  07/06/16               Reason for consult:  Other (Comment Required) (From [facility] )                Permission sought to share information with:  Chartered certified accountant granted to share information::  Yes, Verbal Permission Granted  Name::      Retail buyer::   [place]   Relationship::     Contact Information:     Housing/Transportation Living arrangements for the past 2 months:  [place] of Information:  Patient, Adult Children Patient Interpreter Needed:  None Criminal Activity/Legal Involvement Pertinent to Current Situation/Hospitalization:  No - Comment as needed Significant Relationships:  Adult Children Lives with:  Facility Resident Do you feel safe going back to the place where you live?  Yes Need for family participation in patient care:  Yes (Comment)  Care giving concerns:  Patient is a resident at [facility].    Social Worker assessment / plan:  Holiday representative ([place]) received verbal consult from RN that patient is from [place] [facility] and will go to [facility]. CSW met with patient and his son in law Brandon Calhoun prior to surgery today. CSW introduced self and explained role of CSW department. Per patient he lives at [place] and plans on going to [facility]. CSW explained that patient's insurance [facility] will require authorization for SNF. Per patient his daughter Brandon Calhoun is his HPOA and she is flying in from [place] today. Per Brandon Calhoun admissions coordinator at [facility] patient can come to SNF building at [place]. Patient will likely D/C Monday 07/09/16. FL2 complete.   Employment status:  Retired Nurse, adult PT Recommendations:  Not assessed at this time [place] / Referral to community  resources:  [place]  Patient/Family's Response to care:  Patient is agreeable to going to [facility].   Patient/Family's Understanding of and Emotional Response to Diagnosis, Current Treatment, and Prognosis:  Patient was pleasant and thanked CSW for assistance.   Emotional Assessment Appearance:  Appears stated age Attitude/Demeanor/Rapport:    Affect (typically observed):  Accepting, Adaptable, Pleasant Orientation:  Oriented to Self, Oriented to Place, Oriented to  Time, Oriented to Situation Alcohol / Substance use:  Not Applicable Psych involvement (Current and /or in the community):  No (Comment)  Discharge Needs  Concerns to be addressed:  Discharge Planning Concerns Readmission within the last 30 days:  No Current discharge risk:  Dependent with Mobility Barriers to Discharge:  Continued Medical Work up   [employer], Brandon Beets, LCSW 07/06/2016, 12:30 PM

## 2016-07-06 NOTE — Anesthesia Procedure Notes (Signed)
Spinal  Patient location during procedure: OR Staffing Performed: anesthesiologist  Preanesthetic Checklist Completed: patient identified, site marked, surgical consent, pre-op evaluation, timeout performed, IV checked, risks and benefits discussed and monitors and equipment checked Spinal Block Patient position: sitting Prep: Betadine Patient monitoring: heart rate, continuous pulse ox, blood pressure and cardiac monitor Approach: midline Location: L4-5 Injection technique: single-shot Needle Needle type: Spinocan  Needle gauge: 22 G Needle length: 9 cm Assessment Sensory level: T4 Additional Notes Negative paresthesia. Negative blood return. Positive free-flowing CSF. Expiration date of kit checked and confirmed. Patient tolerated procedure well, without complications.       

## 2016-07-07 LAB — BASIC METABOLIC PANEL
Anion gap: 4 — ABNORMAL LOW (ref 5–15)
BUN: 14 mg/dL (ref 6–20)
CALCIUM: 7.6 mg/dL — AB (ref 8.9–10.3)
CHLORIDE: 105 mmol/L (ref 101–111)
CO2: 24 mmol/L (ref 22–32)
CREATININE: 0.77 mg/dL (ref 0.61–1.24)
GFR calc Af Amer: >60 mL/min (ref >60)
GFR calc non Af Amer: >60 mL/min (ref >60)
Glucose, Bld: 101 mg/dL — ABNORMAL HIGH (ref 65–99)
Potassium: 4.1 mmol/L (ref 3.5–5.1)
Sodium: 133 mmol/L — ABNORMAL LOW (ref 135–145)

## 2016-07-07 LAB — CBC
HEMATOCRIT: 32.4 % — AB (ref 40.0–52.0)
HEMOGLOBIN: 11.2 g/dL — AB (ref 13.0–18.0)
MCH: 31.5 pg (ref 26.0–34.0)
MCHC: 34.6 g/dL (ref 32.0–36.0)
MCV: 91.2 fL (ref 80.0–100.0)
Platelets: 196 10*3/uL (ref 150–440)
RBC: 3.55 MIL/uL — AB (ref 4.40–5.90)
RDW: 14.2 % (ref 11.5–14.5)
WBC: 8.5 10*3/uL (ref 3.8–10.6)

## 2016-07-07 NOTE — Progress Notes (Signed)
Plan of care discussed with patient. Patient denies pain at the current time education given to patient on pain control. Incentive spirometer teaching given with demonstration. Foley intact and IV fluids infusing.

## 2016-07-07 NOTE — Evaluation (Signed)
Physical Therapy Evaluation Patient Details Name: Brandon Calhoun H [place]ilcott MRN: [id number] DOB: Feb 12, 1930 Today's Date: 07/07/2016   History of Present Illness  Pt is an 83 y.o. male presenting to hospital s/p mechanical fall with L hip pain.  Pt s/p L hip hemiarthroplasty 07/06/16 secondary to displaced femoral neck fx.  PMH includes htn and asthma.  Clinical Impression  Prior to hospital admission, pt was independent with functional mobility.  Pt lives at [facility] and receives assist for medication management.  Currently pt is mod to max assist supine to sit; mod to max assist to stand with RW; and min assist to take a few steps bed to recliner with RW.  Posterior lean noted with sitting and initially with standing requiring vc's and assist to correct.  Pt would benefit from skilled PT to address noted impairments and functional limitations.  Recommend pt discharge to STR when medically appropriate.    Follow Up Recommendations SNF    Equipment Recommendations  Rolling walker with 5" wheels    Recommendations for Other Services       Precautions / Restrictions Precautions Precautions: Anterior Hip;Fall Precaution Booklet Issued: Yes (comment) Restrictions Weight Bearing Restrictions: Yes LLE Weight Bearing: Weight bearing as tolerated      Mobility  Bed Mobility Overal bed mobility: Needs Assistance Bed Mobility: Supine to Sit     Supine to sit: Mod assist;Max assist;HOB elevated     General bed mobility comments: assist for trunk and L LE; vc's for technique and use of bed rail; increased time to perform  Transfers Overall transfer level: Needs assistance Equipment used: Rolling walker (2 wheeled) Transfers: Sit to/from Stand Sit to Stand: Mod assist;Max assist         General transfer comment: vc's for hand and feet placement; assist to initiate stand from bed and assist to control descent sitting into recliner  Ambulation/Gait Ambulation/Gait assistance: Min  assist Ambulation Distance (Feet): 3 Feet (bed to recliner) Assistive device: Rolling walker (2 wheeled) Gait Pattern/deviations: Step-to pattern;Decreased stance time - left;Decreased step length - right;Decreased step length - left;Antalgic Gait velocity: decreased   General Gait Details: vc's for gait technique and to increase UE support through RW to offweight L LE  Stairs            Wheelchair Mobility    Modified Rankin (Stroke Patients Only)       Balance Overall balance assessment: History of Falls;Needs assistance Sitting-balance support: Bilateral upper extremity supported;Feet supported Sitting balance-Leahy Scale: Poor Sitting balance - Comments: posterior lean intermittently requiring assist and vc's to lean forward and adjust positioning   Standing balance support: Bilateral upper extremity supported (on RW) Standing balance-Leahy Scale: Poor Standing balance comment: posterior lean initially upon standing requiring assist and vc's to shift weight forward                              Pertinent Vitals/Pain Pain Assessment: 0-10 Pain Score: 2  Pain Location: L hip Pain Descriptors / Indicators: Aching;Sore;Operative site guarding;Tender Pain Intervention(s): Limited activity within patient's tolerance;Monitored during session;Repositioned  Vitals (HR and O2 on room air) stable and WFL throughout treatment session.    Home Living Family/patient expects to be discharged to:: Assisted living     Type of Home: Assisted living         Home Equipment: None Additional Comments: Pt resides at [facility]    Prior Function Level of Independence: Independent  Comments: Pt reports no other falls (other than fall prior to this hospital admission) in last 6 months.  Pt receives assist for medication management.     Hand Dominance        Extremity/Trunk Assessment   Upper Extremity Assessment Upper Extremity Assessment: Overall  WFL for tasks assessed    Lower Extremity Assessment Lower Extremity Assessment: RLE deficits/detail;LLE deficits/detail RLE Deficits / Details: strength and ROM WFL LLE Deficits / Details: hip flexion at least 3-/5; knee flexion/extension at least 3-/5; DF at least 4/5 LLE: Unable to fully assess due to pain       Communication   Communication: No difficulties  Cognition Arousal/Alertness: Awake/alert Behavior During Therapy: WFL for tasks assessed/performed Overall Cognitive Status: Within Functional Limits for tasks assessed                      General Comments General comments (skin integrity, edema, etc.): Mild drainage noted dressing L hip.  Pt agreeable to PT session.    Exercises Total Joint Exercises Ankle Circles/Pumps: AROM;Strengthening;Both;10 reps;Supine Quad Sets: AROM;Strengthening;Both;10 reps;Supine Short Arc Quad: AROM;Strengthening;Both;10 reps;Supine Heel Slides: Strengthening;Both;10 reps;Supine (AROM R; AAROM L) Hip ABduction/ADduction: Strengthening;Both;10 reps;Supine (AROM R; AAROM L)  Vc's required for correct technique with above ex's.   Assessment/Plan    PT Assessment Patient needs continued PT services  PT Problem List Decreased strength;Decreased activity tolerance;Decreased balance;Decreased mobility;Decreased knowledge of use of DME;Decreased knowledge of precautions;Pain          PT Treatment Interventions DME instruction;Gait training;Functional mobility training;Therapeutic activities;Therapeutic exercise;Balance training;Patient/family education    PT Goals (Current goals can be found in the Care Plan section)  Acute Rehab PT Goals Patient Stated Goal: to be able to walk again PT Goal Formulation: With patient Time For Goal Achievement: 07/21/16 Potential to Achieve Goals: Good    Frequency BID   Barriers to discharge Decreased caregiver support      Co-evaluation               End of Session Equipment  Utilized During Treatment: Gait belt Activity Tolerance: Patient tolerated treatment well Patient left: in chair;with call bell/phone within reach;with chair alarm set;with SCD's reapplied (B heels elevated via pillow) Nurse Communication: Mobility status;Precautions;Weight bearing status         Time: 1610-9604 PT Time Calculation (min) (ACUTE ONLY): 27 min   Charges:   PT Evaluation $PT Eval Low Complexity: 1 Procedure PT Treatments $Therapeutic Exercise: 8-22 mins   PT G CodesHendricks Limes 08/01/16, 9:40 AM Hendricks Limes, PT [phone]

## 2016-07-07 NOTE — Progress Notes (Signed)
[employer] [facility] [facility] [place] [facility]   PATIENT NAME: Brandon Calhoun    MR#:  [id number]  DATE OF BIRTH:  [birth year]  SUBJECTIVE:  CHIEF COMPLAINT:   Chief Complaint  Patient presents with  . Fall   Came with a fall and fracture hip,s/p surgery 07/06/16- stood up with PT today.  REVIEW OF SYSTEMS:  CONSTITUTIONAL: No fever, fatigue or weakness.  EYES: No blurred or double vision.  EARS, NOSE, AND THROAT: No tinnitus or ear pain.  RESPIRATORY: No cough, shortness of breath, wheezing or hemoptysis.  CARDIOVASCULAR: No chest pain, orthopnea, edema.  GASTROINTESTINAL: No nausea, vomiting, diarrhea or abdominal pain.  GENITOURINARY: No dysuria, hematuria.  ENDOCRINE: No polyuria, nocturia,  HEMATOLOGY: No anemia, easy bruising or bleeding SKIN: No rash or lesion. MUSCULOSKELETAL: No joint pain or arthritis.   NEUROLOGIC: No tingling, numbness, weakness.  PSYCHIATRY: No anxiety or depression.   ROS  DRUG ALLERGIES:  No Known Allergies  VITALS:  Blood pressure (!) 104/51, pulse 86, temperature 98.5 F (36.9 C), temperature source Oral, resp. rate 18, height 5\' 9"  (1.753 m), weight 67.1 kg (148 lb), SpO2 98 %.  PHYSICAL EXAMINATION:  GENERAL:  83 y.o.-year-old patient lying in the bed with no acute distress.  EYES: Pupils equal, round, reactive to light and accommodation. No scleral icterus. Extraocular muscles intact.  HEENT: Head atraumatic, normocephalic. Oropharynx and nasopharynx clear.  NECK:  Supple, no jugular venous distention. No thyroid enlargement, no tenderness.  LUNGS: Normal breath sounds bilaterally, no wheezing, rales,rhonchi or crepitation. No use of accessory muscles of respiration.  CARDIOVASCULAR: S1, S2 normal. No murmurs, rubs, or gallops.  ABDOMEN: Soft, nontender, nondistended. Bowel sounds present. No organomegaly or mass.  EXTREMITIES: No pedal edema, cyanosis, or clubbing.  NEUROLOGIC: Cranial nerves II through XII are intact. Muscle  strength 5/5 in all extremities. Sensation intact. Gait not checked.  PSYCHIATRIC: The patient is alert and oriented x 3.  SKIN: No obvious rash, lesion, or ulcer.   Physical Exam LABORATORY PANEL:   CBC  Recent Labs Lab 07/07/16 0418  WBC 8.5  HGB 11.2*  HCT 32.4*  PLT 196   ------------------------------------------------------------------------------------------------------------------  Chemistries   Recent Labs Lab 07/06/16 0015  07/07/16 0418  NA 134*  < > 133*  K 4.2  < > 4.1  CL 105  < > 105  CO2 23  < > 24  GLUCOSE 137*  < > 101*  BUN 23*  < > 14  CREATININE 0.81  < > 0.77  CALCIUM 8.6*  < > 7.6*  AST 23  --   --   ALT 23  --   --   ALKPHOS 69  --   --   BILITOT 0.8  --   --   < > = values in this interval not displayed. ------------------------------------------------------------------------------------------------------------------  Cardiac Enzymes No results for input(s): TROPONINI in the last 168 hours. ------------------------------------------------------------------------------------------------------------------  RADIOLOGY:  Dg Chest 1 View  Result Date: 07/06/2016 CLINICAL DATA:  Fall EXAM: CHEST 1 VIEW COMPARISON:  None. FINDINGS: The heart size and mediastinal contours are within normal limits. Both lungs are clear. The visualized skeletal structures are unremarkable. IMPRESSION: No active disease. Electronically Signed   By: Jasmine PangKim  Fujinaga M.D.   On: 07/06/2016 00:23   Dg Hip Operative Unilat With Pelvis Left  Result Date: 07/06/2016 CLINICAL DATA:  Left hip arthroplasty. EXAM: OPERATIVE LEFT HIP (WITH PELVIS IF PERFORMED) 1 VIEW TECHNIQUE: Fluoroscopic spot image(s) were submitted for interpretation post-operatively. COMPARISON:  07/05/2016  preoperative study FINDINGS: A total of 21 seconds of fluoroscopic time was utilized during placement of a left total hip arthroplasty with uncemented femoral component. Fine bony detail is limited by the  C-arm fluoroscopic technique. Hernia repair tacks are seen overlying the left hip. Alignment appears near anatomic for a single frontal view. IMPRESSION: Left uncemented total hip arthroplasty. 21 seconds of fluoroscopic time utilized during the procedure. Electronically Signed   By: Tollie Eth M.D.   On: 07/06/2016 17:11   Dg Hip Unilat W Or W/o Pelvis 2-3 Views Left  Result Date: 07/06/2016 CLINICAL DATA:  Postop bipolar hip arthroplasty EXAM: DG HIP (WITH OR WITHOUT PELVIS) 2-3V LEFT COMPARISON:  07/05/2016 preoperative study FINDINGS: New uncemented bipolar left hip arthroplasty in anatomic position with postoperative subcutaneous emphysema and overlying skin staples noted. No acute postoperative fracture nor hardware failure. Native right hip is unremarkable. Foley catheter is partially imaged. IMPRESSION: New left uncemented bipolar hip arthroplasty. Electronically Signed   By: Tollie Eth M.D.   On: 07/06/2016 17:32   Dg Hip Unilat With Pelvis 2-3 Views Left  Result Date: 07/06/2016 CLINICAL DATA:  Fall with left hip pain. Leg shortening and rotation. EXAM: DG HIP (WITH OR WITHOUT PELVIS) 2-3V LEFT COMPARISON:  None. FINDINGS: Displaced left femoral neck fracture with foreshortening of the femoral shaft. No significant angulation. Femoral head is seated in the acetabulum. No additional fracture of the bony pelvis. Multiple tacks from prior left inguinal hernia repair. IMPRESSION: Displaced left femoral neck fracture with foreshortening. Electronically Signed   By: Rubye Oaks M.D.   On: 07/06/2016 00:23    ASSESSMENT AND PLAN:   Principal Problem:   Hip fracture (HCC)  83 year old elderly male patient with history of hypertension, bronchial asthma, GERD presented to the emergency room with fall and pain in the left hip.  1. Left femoral neck fracture 2. Dehydration 3. Hyponatremia 4. Leukocytosis 5. Accidental fall 6. Hypertension Treatment plan  IV fluid hydration for  dehydration Follow-up sodium level- still slightly low, pt asymptomatic. Orthopedic surgery consultation for left femoral neck fracture- s/p surgery 07/06/16. DVT prophylaxis sequential compression device to lower extremities Resume lisinopril for hypertension Left hip pain management with oral Norco and when necessary morphine IV No evidence of any infection, will follow-up WBC count Supportive care. Management plans discussed with the patient, family and they are in agreement.   All the records are reviewed and case discussed with Care Management/Social Workerr. Management plans discussed with the patient, family and they are in agreement.  CODE STATUS: Full  TOTAL TIME TAKING CARE OF THIS PATIENT: 35 minutes.     POSSIBLE D/C IN 2-3 DAYS, DEPENDING ON CLINICAL CONDITION.   Altamese Dilling M.D on 07/07/2016   Between 7am to 6pm - Pager - [phone]  After 6pm go to [email or web address] - password [email or web address]  [facility] [place] [facility]  Office  [phone]  CC: Primary care physician; [facility]  Note: This dictation was prepared with Dragon dictation along with smaller phrase technology. Any transcriptional errors that result from this process are unintentional.

## 2016-07-07 NOTE — Progress Notes (Signed)
Physical Therapy Treatment Patient Details Name: Brandon Calhoun MRN: [id number] DOB: [birth year] Today's Date: 07/07/2016    History of Present Illness Pt is an 83 y.o. male presenting to hospital s/p mechanical fall with L hip pain.  Pt s/p L hip hemiarthroplasty 07/06/16 secondary to displaced femoral neck fx.  PMH includes htn and asthma.    PT Comments    Pt had returned to bed with nursing.  Agrees to supine exercises as described below.  Overall tolerated well with reports of minimal soreness with exercises.  Will cont as appropriate.  Follow Up Recommendations  SNF     Equipment Recommendations  Rolling walker with 5" wheels    Recommendations for Other Services       Precautions / Restrictions Precautions Precautions: Anterior Hip;Fall Restrictions Weight Bearing Restrictions: Yes LLE Weight Bearing: Weight bearing as tolerated    Mobility  Bed Mobility                  Transfers                    Ambulation/Gait                 Stairs            Wheelchair Mobility    Modified Rankin (Stroke Patients Only)       Balance                                    Cognition Arousal/Alertness: Awake/alert Behavior During Therapy: WFL for tasks assessed/performed Overall Cognitive Status: Within Functional Limits for tasks assessed                      Exercises Total Joint Exercises Ankle Circles/Pumps: AROM;Strengthening;Both;Supine;20 reps Quad Sets: AROM;Strengthening;Both;Supine;20 reps Gluteal Sets: AROM;20 reps;Strengthening;Both;Supine Towel Squeeze: AROM;20 reps;Strengthening;Both;Supine Short Arc Quad: AROM;20 reps;Strengthening;Supine;Left Heel Slides: AROM;20 reps;Strengthening;Supine;Left Hip ABduction/ADduction: AROM;20 reps;Strengthening;Supine;Left    General Comments        Pertinent Vitals/Pain Pain Assessment: No/denies pain Pain Location: Pt denies pain stating "It's just  sore" Pain Descriptors / Indicators: Sore    Home Living                      Prior Function            PT Goals (current goals can now be found in the care plan section)      Frequency    BID      PT Plan Current plan remains appropriate    Co-evaluation             End of Session Equipment Utilized During Treatment: Gait belt Activity Tolerance: Patient tolerated treatment well Patient left: in bed;with call bell/phone within reach;with family/visitor present;with bed alarm set     Time: [id number] PT Time Calculation (min) (ACUTE ONLY): 19 min  Charges:  $Therapeutic Exercise: 8-22 mins                    G Codes:      Danielle DessSarah Linton Stolp 07/07/2016, 4:05 PM

## 2016-07-07 NOTE — Progress Notes (Signed)
  Subjective: 1 Day Post-Op Procedure(s) (LRB): ARTHROPLASTY BIPOLAR HIP (HEMIARTHROPLASTY) (Left) Patient reports pain as mild.   Patient is well, and has had no acute complaints or problems PT and Care management to assist with patient discharge. Negative for chest pain and shortness of breath Fever: no Gastrointestinal:Negative for nausea and vomiting  Objective: Vital signs in last 24 hours: Temp:  [97.8 F (36.6 C)-99.4 F (37.4 C)] 98.5 F (36.9 C) (01/20 0355) Pulse Rate:  [75-87] 80 (01/20 0355) Resp:  [14-22] 18 (01/20 0355) BP: (109-156)/(41-87) 121/56 (01/20 0355) SpO2:  [93 %-100 %] 96 % (01/20 0355) Weight:  [67.1 kg (148 lb)] 67.1 kg (148 lb) (01/19 1407)  Intake/Output from previous day:  Intake/Output Summary (Last 24 hours) at 07/07/16 0749 Last data filed at 07/07/16 0600  Gross per 24 hour  Intake             2585 ml  Output             1360 ml  Net             1225 ml    Intake/Output this shift: No intake/output data recorded.  Labs:  Recent Labs  07/06/16 0015 07/06/16 0447  HGB 12.5* 12.0*    Recent Labs  07/06/16 0015 07/06/16 0447  WBC 12.6* 13.0*  RBC 4.00* 3.80*  HCT 36.2* 34.2*  PLT 252 243    Recent Labs  07/06/16 0447 07/07/16 0418  NA 132* 133*  K 4.1 4.1  CL 104 105  CO2 22 24  BUN 21* 14  CREATININE 0.66 0.77  GLUCOSE 119* 101*  CALCIUM 8.1* 7.6*    Recent Labs  07/06/16 0015  INR 1.05     EXAM General - Patient is Alert, Appropriate and Oriented Extremity - ABD soft Neurovascular intact Sensation intact distally Dorsiflexion/Plantar flexion intact Incision: dressing C/D/I No cellulitis present  Mild erythema to the left hip incision, does not appear infectious. Dressing/Incision - clean, dry, no drainage Motor Function - intact, moving foot and toes well on exam.  Abdomen soft with normal BS.  Past Medical History:  Diagnosis Date  . Asthma   . Edema    MILD OF FEET  . Edema    MILD OF FEET   . GERD (gastroesophageal reflux disease)   . Hypertension     Assessment/Plan: 1 Day Post-Op Procedure(s) (LRB): ARTHROPLASTY BIPOLAR HIP (HEMIARTHROPLASTY) (Left) Principal Problem:   Hip fracture (HCC)  Estimated body mass index is 21.86 kg/m as calculated from the following:   Height as of this encounter: 5\' 9"  (1.753 m).   Weight as of this encounter: 67.1 kg (148 lb). Advance diet Up with therapy D/C IV fluids when tolerating po intake.  Begin working on BM. Up with therapy today. Labs reviewed, Na 133 this AM, encouraged increased oral intake today.  Will place order for CBC for later today since patient underwent surgery yesterday.  DVT Prophylaxis - Lovenox, Foot Pumps and TED hose Weight-Bearing as tolerated to left leg  J. Horris LatinoLance Braeson Rupe, PA-C [facility] 07/07/2016, 7:49 AM

## 2016-07-07 NOTE — Progress Notes (Signed)
RN notified of low BP reading. Manual recheck of 92/58. Pt asymptomatic, denies dizziness,weakness. Encourage PO intake of fluids.  Continue IV fluids and monitor for changes.  Pt taking lisinopril for BP. Will notify MD at rounds.

## 2016-07-07 NOTE — Clinical Social Work Note (Signed)
CSW spoke with family and patient at bedside to answer questions about discharge Monday and insurance authorization. Patient's family thanked CSW for time and are aware that CSW is available for any other needs.  Brandon PonderKaren Martha Raelin Calhoun, MSW, Theresia MajorsLCSWA [phone]

## 2016-07-08 NOTE — Progress Notes (Signed)
Physical Therapy Treatment Patient Details Name: Brandon Calhoun MRN: [id number] DOB: 1930/04/28 Today's Date: 07/08/2016    History of Present Illness Pt is an 83 y.o. male presenting to hospital s/p mechanical fall with L hip pain.  Pt s/p L hip hemiarthroplasty 07/06/16 secondary to displaced femoral neck fx.  PMH includes htn and asthma.    PT Comments    Pt able to progress to ambulating 15 feet with RW min assist.  Pt still demonstrating posterior lean with sitting and standing but significant improvement in balance noted compared to yesterday's therapy session.  Pt's pain 6/10 L hip with ambulation but pt reporting 0/10 end of session.  Pt continues to be very motivated to participate in PT.  Will continue to progress pt with strengthening, balance, and progressive functional mobility per pt tolerance.   Follow Up Recommendations  SNF     Equipment Recommendations  Rolling walker with 5" wheels    Recommendations for Other Services       Precautions / Restrictions Precautions Precautions: Anterior Hip;Fall Precaution Booklet Issued: Yes (comment) Restrictions Weight Bearing Restrictions: Yes LLE Weight Bearing: Weight bearing as tolerated    Mobility  Bed Mobility Overal bed mobility: Needs Assistance Bed Mobility: Rolling;Supine to Sit Rolling: Mod assist (logrolling to R and L for clean-up and depends donned)   Supine to sit: Mod assist;HOB elevated     General bed mobility comments: assist for trunk and L LE; vc's for technique and use of bed rail; increased time to perform  Transfers Overall transfer level: Needs assistance Equipment used: Rolling walker (2 wheeled) Transfers: Sit to/from Stand Sit to Stand: Min assist;Mod assist         General transfer comment: vc's for hand and feet placement; assist to initiate stand from bed and assist to control descent sitting into recliner  Ambulation/Gait Ambulation/Gait assistance: Min assist Ambulation  Distance (Feet): 15 Feet Assistive device: Rolling walker (2 wheeled) Gait Pattern/deviations: Step-to pattern;Decreased stance time - left;Decreased step length - right;Decreased step length - left;Antalgic Gait velocity: decreased   General Gait Details: vc's for gait technique and to increase UE support through RW to offweight L LE   Stairs            Wheelchair Mobility    Modified Rankin (Stroke Patients Only)       Balance Overall balance assessment: History of Falls;Needs assistance Sitting-balance support: Bilateral upper extremity supported;Feet supported Sitting balance-Leahy Scale: Poor Sitting balance - Comments: posterior lean initially sitting edge of bed requiring assist and vc's to lean forward and adjust positioning   Standing balance support: Bilateral upper extremity supported (on RW) Standing balance-Leahy Scale: Poor Standing balance comment: posterior lean initially upon standing requiring assist and vc's to shift weight forward                     Cognition Arousal/Alertness: Awake/alert Behavior During Therapy: WFL for tasks assessed/performed Overall Cognitive Status: Within Functional Limits for tasks assessed                      Exercises Total Joint Exercises Ankle Circles/Pumps: AROM;Strengthening;Both;10 reps;Supine Quad Sets: AROM;Strengthening;Both;10 reps;Supine Gluteal Sets: AROM;Strengthening;Both;10 reps;Supine Towel Squeeze: AROM;Strengthening;Both;10 reps;Supine (Pillow between pt's knees) Short Arc Quad: AROM;Strengthening;Both;10 reps;Supine Heel Slides: Strengthening;Both;10 reps;Supine (AROM R; AAROM L) Hip ABduction/ADduction: AROM;Strengthening;Both;10 reps;Supine (AROM R; AAROM L) Long Arc Quad: Strengthening;Both;10 reps;Supine (AROM R; AAROM L)  Vc's required for technique of above ex's.    General Comments  General comments (skin integrity, edema, etc.): significant drainage noted L hip dressing  (nursing notified and came to assess; nursing changed dressing).  Pt agreeable to PT session.      Pertinent Vitals/Pain Pain Assessment: 0-10 Pain Score: 6  (with activity 6/10; at rest 0/10 end of session) Pain Location: L hip Pain Descriptors / Indicators: Sore Pain Intervention(s): Limited activity within patient's tolerance;Monitored during session;RN gave pain meds during session;Repositioned  Vitals (HR and O2 on room air) stable and WFL throughout treatment session.    Home Living                      Prior Function            PT Goals (current goals can now be found in the care plan section) Acute Rehab PT Goals Patient Stated Goal: to be able to walk again PT Goal Formulation: With patient Time For Goal Achievement: 07/21/16 Potential to Achieve Goals: Good Progress towards PT goals: Progressing toward goals    Frequency    BID      PT Plan Current plan remains appropriate    Co-evaluation             End of Session Equipment Utilized During Treatment: Gait belt Activity Tolerance: Patient tolerated treatment well Patient left: in chair;with call bell/phone within reach;with chair alarm set;with SCD's reapplied (B heels elevated via pillow)     Time: 1610-9604 PT Time Calculation (min) (ACUTE ONLY): 39 min  Charges:  $Gait Training: 8-22 mins $Therapeutic Exercise: 8-22 mins $Therapeutic Activity: 8-22 mins                    G CodesHendricks Limes 07-26-16, 9:32 AM Hendricks Limes, PT [phone]

## 2016-07-08 NOTE — Progress Notes (Signed)
Pt reports pain well controlled with tylenol. No narcotics given this shift. Lg BM produced without intervention this afternoon. Pt transfers from bed to chair with 1 assist, using RW and BSC. Family at bedside.
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
## 2016-07-08 NOTE — Progress Notes (Signed)
[employer] - [place] [facility]   PATIENT NAME: Brandon Calhoun    MR#:  [id number]  DATE OF BIRTH:  [birth year]  SUBJECTIVE:  CHIEF COMPLAINT:   Chief Complaint  Patient presents with  . Fall   Came with a fall and fracture hip,s/p surgery 07/06/16- stood up and participated with PT today.  REVIEW OF SYSTEMS:  CONSTITUTIONAL: No fever, fatigue or weakness.  EYES: No blurred or double vision.  EARS, NOSE, AND THROAT: No tinnitus or ear pain.  RESPIRATORY: No cough, shortness of breath, wheezing or hemoptysis.  CARDIOVASCULAR: No chest pain, orthopnea, edema.  GASTROINTESTINAL: No nausea, vomiting, diarrhea or abdominal pain.  GENITOURINARY: No dysuria, hematuria.  ENDOCRINE: No polyuria, nocturia,  HEMATOLOGY: No anemia, easy bruising or bleeding SKIN: No rash or lesion. MUSCULOSKELETAL: No joint pain or arthritis.   NEUROLOGIC: No tingling, numbness, weakness.  PSYCHIATRY: No anxiety or depression.   ROS  DRUG ALLERGIES:  No Known Allergies  VITALS:  Blood pressure (!) 142/56, pulse 87, temperature 98.5 F (36.9 C), temperature source Oral, resp. rate 18, height 5\' 9"  (1.753 m), weight 67.1 kg (148 lb), SpO2 98 %.  PHYSICAL EXAMINATION:  GENERAL:  83 y.o.-year-old patient lying in the bed with no acute distress.  EYES: Pupils equal, round, reactive to light and accommodation. No scleral icterus. Extraocular muscles intact.  HEENT: Head atraumatic, normocephalic. Oropharynx and nasopharynx clear.  NECK:  Supple, no jugular venous distention. No thyroid enlargement, no tenderness.  LUNGS: Normal breath sounds bilaterally, no wheezing, rales,rhonchi or crepitation. No use of accessory muscles of respiration.  CARDIOVASCULAR: S1, S2 normal. No murmurs, rubs, or gallops.  ABDOMEN: Soft, nontender, nondistended. Bowel sounds present. No organomegaly or mass.  EXTREMITIES: No pedal edema, cyanosis, or clubbing.  NEUROLOGIC: Cranial nerves II through XII  are intact. Muscle strength 5/5 in all extremities. Sensation intact. Gait not checked.  PSYCHIATRIC: The patient is alert and oriented x 3.  SKIN: No obvious rash, lesion, or ulcer.   Physical Exam LABORATORY PANEL:   CBC  Recent Labs Lab 07/07/16 0418  WBC 8.5  HGB 11.2*  HCT 32.4*  PLT 196   ------------------------------------------------------------------------------------------------------------------  Chemistries   Recent Labs Lab 07/06/16 0015  07/07/16 0418  NA 134*  < > 133*  K 4.2  < > 4.1  CL 105  < > 105  CO2 23  < > 24  GLUCOSE 137*  < > 101*  BUN 23*  < > 14  CREATININE 0.81  < > 0.77  CALCIUM 8.6*  < > 7.6*  AST 23  --   --   ALT 23  --   --   ALKPHOS 69  --   --   BILITOT 0.8  --   --   < > = values in this interval not displayed. ------------------------------------------------------------------------------------------------------------------  Cardiac Enzymes No results for input(s): TROPONINI in the last 168 hours. ------------------------------------------------------------------------------------------------------------------  RADIOLOGY:  Dg Hip Unilat W Or W/o Pelvis 2-3 Views Left  Result Date: 07/06/2016 CLINICAL DATA:  Postop bipolar hip arthroplasty EXAM: DG HIP (WITH OR WITHOUT PELVIS) 2-3V LEFT COMPARISON:  07/05/2016 preoperative study FINDINGS: New uncemented bipolar left hip arthroplasty in anatomic position with postoperative subcutaneous emphysema and overlying skin staples noted. No acute postoperative fracture nor hardware failure. Native right hip is unremarkable. Foley catheter is partially imaged. IMPRESSION: New left uncemented bipolar hip arthroplasty. Electronically Signed   By: Tollie Ethavid  Kwon M.D.   On: 07/06/2016 17:32    ASSESSMENT AND  PLAN:   Principal Problem:   Hip fracture (HCC)  83 year old elderly male patient with history of hypertension, bronchial asthma, GERD presented to the emergency room with fall and pain in  the left hip.  1. Left femoral neck fracture 2. Dehydration 3. Hyponatremia 4. Leukocytosis 5. Accidental fall 6. Hypertension Treatment plan  IV fluid hydration for dehydration Follow-up sodium level- still slightly low, pt asymptomatic. Orthopedic surgery for left femoral neck fracture- s/p surgery 07/06/16. DVT prophylaxis sequential compression device to lower extremities Resume lisinopril for hypertension Left hip pain management with oral Norco and when necessary morphine IV No evidence of any infection, Supportive care. Management plans discussed with the patient, family and they are in agreement. Having good response to treatment here.  All the records are reviewed and case discussed with Care Management/Social Workerr. Management plans discussed with the patient, family and they are in agreement.  CODE STATUS: Full  TOTAL TIME TAKING CARE OF THIS PATIENT: 35 minutes.     POSSIBLE D/C IN 2-3 DAYS, DEPENDING ON CLINICAL CONDITION.   Altamese Dilling M.D on 07/08/2016   Between 7am to 6pm - Pager - [phone]  After 6pm go to [email or web address] - password [email or web address]  [facility]  Office  [phone]  CC: Primary care physician; [facility] Acute C  Note: This dictation was prepared with Dragon dictation along with smaller phrase technology. Any transcriptional errors that result from this process are unintentional.

## 2016-07-08 NOTE — Progress Notes (Signed)
ORTHOPAEDICS PROGRESS NOTE  PATIENT NAME: Brandon Calhoun H Burda DOB: July 03, 1929  MRN: [id number]  POD # 2: Left hip hemiarthroplasty for a femoral neck fracture  Subjective: The patient is seen sitting up in chair with occupational therapy. His family is present. He reports pain to be well controlled. He is tolerating therapy well.  Objective: Vital signs in last 24 hours: Temp:  [98.5 F (36.9 C)-99 F (37.2 C)] 98.5 F (36.9 C) (01/21 0749) Pulse Rate:  [78-94] 91 (01/21 0749) Resp:  [16-18] 18 (01/21 0749) BP: (102-141)/(51-64) 141/64 (01/21 0749) SpO2:  [93 %-99 %] 93 % (01/21 0749)  Intake/Output from previous day: 01/20 0701 - 01/21 0700 In: 360 [P.O.:360] Out: 800 [Urine:800]   Recent Labs  07/06/16 0015 07/06/16 0447 07/07/16 0418  WBC 12.6* 13.0* 8.5  HGB 12.5* 12.0* 11.2*  HCT 36.2* 34.2* 32.4*  PLT 252 243 196  K 4.2 4.1 4.1  CL 105 104 105  CO2 23 22 24   BUN 23* 21* 14  CREATININE 0.81 0.66 0.77  GLUCOSE 137* 119* 101*  CALCIUM 8.6* 8.1* 7.6*  INR 1.05  --   --     EXAM General: Well-developed well-nourished male seen in no acute distress Lungs: clear to auscultation Cardiac: normal rate, regular rhythm, normal S1, S2, no murmurs, rubs, clicks or gallops, normal rate and regular rhythm Left lower extremity: Left hip dressing is dry and clean. No significant ecchymosis or swelling. Homans test is negative. Neurologic: Awake, alert, and oriented. Sensory motor function are grossly intact.  Assessment: Left hip hemiarthroplasty for femoral neck fracture  Secondary diagnoses: Hypertension Gastroesophageal reflux disease Asthma  Plan: Notes from physical therapy were reviewed. He still has some issues with balance but his very motivated. Continue physical therapy and occupational therapy. Plan is to go Skilled nursing facility after hospital stay [facility]. DVT Prophylaxis - Lovenox, TED hose and compression pumps  James P. Angie FavaHooten, Jr. M.D.

## 2016-07-09 ENCOUNTER — Encounter: Payer: Self-pay | Admitting: Orthopedic Surgery

## 2016-07-09 DIAGNOSIS — R2681 Unsteadiness on feet: Secondary | ICD-10-CM | POA: Diagnosis not present

## 2016-07-09 DIAGNOSIS — R600 Localized edema: Secondary | ICD-10-CM | POA: Diagnosis not present

## 2016-07-09 DIAGNOSIS — J45909 Unspecified asthma, uncomplicated: Secondary | ICD-10-CM | POA: Diagnosis not present

## 2016-07-09 DIAGNOSIS — S72012A Unspecified intracapsular fracture of left femur, initial encounter for closed fracture: Secondary | ICD-10-CM | POA: Diagnosis not present

## 2016-07-09 DIAGNOSIS — Z7401 Bed confinement status: Secondary | ICD-10-CM | POA: Diagnosis not present

## 2016-07-09 DIAGNOSIS — S72002D Fracture of unspecified part of neck of left femur, subsequent encounter for closed fracture with routine healing: Secondary | ICD-10-CM | POA: Diagnosis not present

## 2016-07-09 DIAGNOSIS — M25552 Pain in left hip: Secondary | ICD-10-CM | POA: Diagnosis not present

## 2016-07-09 DIAGNOSIS — M6281 Muscle weakness (generalized): Secondary | ICD-10-CM | POA: Diagnosis not present

## 2016-07-09 DIAGNOSIS — R269 Unspecified abnormalities of gait and mobility: Secondary | ICD-10-CM | POA: Diagnosis not present

## 2016-07-09 DIAGNOSIS — I10 Essential (primary) hypertension: Secondary | ICD-10-CM | POA: Diagnosis not present

## 2016-07-09 DIAGNOSIS — M24511 Contracture, right shoulder: Secondary | ICD-10-CM | POA: Diagnosis not present

## 2016-07-09 DIAGNOSIS — K219 Gastro-esophageal reflux disease without esophagitis: Secondary | ICD-10-CM | POA: Diagnosis not present

## 2016-07-09 DIAGNOSIS — R509 Fever, unspecified: Secondary | ICD-10-CM | POA: Diagnosis not present

## 2016-07-09 DIAGNOSIS — Z9181 History of falling: Secondary | ICD-10-CM | POA: Diagnosis not present

## 2016-07-09 LAB — CBC
HEMATOCRIT: 30.4 % — AB (ref 40.0–52.0)
Hemoglobin: 10.9 g/dL — ABNORMAL LOW (ref 13.0–18.0)
MCH: 32.1 pg (ref 26.0–34.0)
MCHC: 35.8 g/dL (ref 32.0–36.0)
MCV: 89.6 fL (ref 80.0–100.0)
PLATELETS: 240 10*3/uL (ref 150–440)
RBC: 3.4 MIL/uL — AB (ref 4.40–5.90)
RDW: 14 % (ref 11.5–14.5)
WBC: 10 10*3/uL (ref 3.8–10.6)

## 2016-07-09 LAB — BASIC METABOLIC PANEL
Anion gap: 6 (ref 5–15)
BUN: 21 mg/dL — AB (ref 6–20)
CHLORIDE: 103 mmol/L (ref 101–111)
CO2: 23 mmol/L (ref 22–32)
Calcium: 7.5 mg/dL — ABNORMAL LOW (ref 8.9–10.3)
Creatinine, Ser: 0.66 mg/dL (ref 0.61–1.24)
GFR calc Af Amer: >60 mL/min (ref >60)
GLUCOSE: 98 mg/dL (ref 65–99)
POTASSIUM: 3.6 mmol/L (ref 3.5–5.1)
Sodium: 132 mmol/L — ABNORMAL LOW (ref 135–145)

## 2016-07-09 MED ORDER — MAGNESIUM CITRATE PO SOLN: 1.0000 | Freq: Every day | ORAL | 0 refills | Status: DC | PRN

## 2016-07-09 MED ORDER — SENNOSIDES-DOCUSATE SODIUM 8.6-50 MG PO TABS: 1.0000 | ORAL_TABLET | Freq: Every evening | ORAL | 0 refills | Status: DC | PRN

## 2016-07-09 MED ORDER — HYDROCODONE-ACETAMINOPHEN 5-325 MG PO TABS: 1.0000 | ORAL_TABLET | Freq: Four times a day (QID) | ORAL | 0 refills | Status: DC | PRN

## 2016-07-09 MED ORDER — ENOXAPARIN SODIUM 40 MG/0.4ML ~~LOC~~ SOLN: 40.0000 mg | SUBCUTANEOUS | 0 refills | Status: DC

## 2016-07-09 NOTE — Discharge Summary (Signed)
[facility] - [place] at [facility]   PATIENT NAME: Brandon Calhoun    MR#:  [id number]  DATE OF BIRTH:  [birth year]  DATE OF ADMISSION:  07/05/2016 ADMITTING PHYSICIAN: Ihor Austin, MD  DATE OF DISCHARGE: 07/09/2016  PRIMARY CARE PHYSICIAN: [facility]    ADMISSION DIAGNOSIS:  Closed displaced fracture of left femoral neck (HCC) [S72.002A]  DISCHARGE DIAGNOSIS:  Principal Problem:   Hip fracture (HCC)   S/p surgery  SECONDARY DIAGNOSIS:   Past Medical History:  Diagnosis Date  . Asthma   . Edema    MILD OF FEET  . Edema    MILD OF FEET  . GERD (gastroesophageal reflux disease)   . Hypertension     HOSPITAL COURSE:   83 year old elderly male patient with history of hypertension, bronchial asthma, GERD presented to the emergency room with fall and pain in the left hip.  1.Left femoral neck fracture 2.Dehydration 3.Hyponatremia 4.Leukocytosis 5.Accidental fall 6.Hypertension Treatment plan  IV fluid hydration for dehydration Follow-up sodium level- still slightly low, pt asymptomatic. Orthopedic surgery for left femoral neck fracture- s/p surgery 07/06/16. DVT prophylaxis sequential compression device to lower extremities Resume lisinoprilfor hypertension Left hip pain management with oral Norco and when necessary morphine IV No evidence of any infection, Supportive care. Management plans discussed with the patient, family and they are in agreement. Having good response to treatment here.   DISCHARGE CONDITIONS:  Stable./  CONSULTS OBTAINED:  Treatment Team:  Kennedy Bucker, MD  DRUG ALLERGIES:  No Known Allergies  DISCHARGE MEDICATIONS:   Current Discharge Medication List    START taking these medications   Details  enoxaparin (LOVENOX) 40 MG/0.4ML injection Inject 0.4 mLs (40 mg total) into the skin daily. Qty: 12 Syringe, Refills: 0    HYDROcodone-acetaminophen (NORCO/VICODIN) 5-325 MG tablet  Take 1-2 tablets by mouth every 6 (six) hours as needed for moderate pain or severe pain. Qty: 30 tablet, Refills: 0    magnesium citrate SOLN Take 296 mLs (1 Bottle total) by mouth daily as needed for severe constipation. Qty: 195 mL, Refills: 0    senna-docusate (SENOKOT-S) 8.6-50 MG tablet Take 1 tablet by mouth at bedtime as needed for mild constipation. Qty: 15 tablet, Refills: 0      CONTINUE these medications which have NOT CHANGED   Details  acetaminophen (TYLENOL) 325 MG tablet Take 650 mg by mouth every 6 (six) hours as needed.    alum & mag hydroxide-simeth (MAALOX/MYLANTA) 200-200-20 MG/5ML suspension Take 10 mLs by mouth as needed for indigestion or heartburn.    Artificial Tear Ointment (ARTIFICIAL TEARS) ointment Place 1 application into both eyes as needed.    bismuth subsalicylate (PEPTO BISMOL) 262 MG/15ML suspension Take 10 mLs by mouth 3 (three) times daily as needed.    budesonide-formoterol (SYMBICORT) 160-4.5 MCG/ACT inhaler Inhale 2 puffs into the lungs 2 (two) times daily.    carbamide peroxide (DEBROX) 6.5 % otic solution Place 5 drops into both ears as needed.    clobetasol ointment (TEMOVATE) 0.05 % Apply 1 application topically 2 (two) times daily. AS NEEDED    diphenhydrAMINE (BENADRYL) 25 MG tablet Take 25 mg by mouth every 4 (four) hours as needed.    guaiFENesin-dextromethorphan (ROBITUSSIN DM) 100-10 MG/5ML syrup Take 10 mLs by mouth every 4 (four) hours as needed for cough.    lisinopril (PRINIVIL,ZESTRIL) 10 MG tablet Take 10 mg by mouth daily.    magnesium hydroxide (MILK OF MAGNESIA) 400 MG/5ML suspension Take 10 mLs  by mouth daily as needed for mild constipation.    nystatin (NYSTATIN) powder Apply 1 g topically 2 (two) times daily as needed.    ranitidine (ZANTAC) 300 MG capsule Take 300 mg by mouth every evening.         DISCHARGE INSTRUCTIONS:    Cherlynn PoloStaples can be removed by the SNF on 07/18/16.  [facility] 6 weeks for x-rays of the left hip. Continue Lovenox 40mg  daily x 14 days.  If you experience worsening of your admission symptoms, develop shortness of breath, life threatening emergency, suicidal or homicidal thoughts you must seek medical attention immediately by calling 911 or calling your MD immediately  if symptoms less severe.  You Must read complete instructions/literature along with all the possible adverse reactions/side effects for all the Medicines you take and that have been prescribed to you. Take any new Medicines after you have completely understood and accept all the possible adverse reactions/side effects.   Please note  You were cared for by a hospitalist during your hospital stay. If you have any questions about your discharge medications or the care you received while you were in the hospital after you are discharged, you can call the unit and asked to speak with the hospitalist on call if the hospitalist that took care of you is not available. Once you are discharged, your primary care physician will handle any further medical issues. Please note that NO REFILLS for any discharge medications will be authorized once you are discharged, as it is imperative that you return to your primary care physician (or establish a relationship with a primary care physician if you do not have one) for your aftercare needs so that they can reassess your need for medications and monitor your lab values.    Today   CHIEF COMPLAINT:   Chief Complaint  Patient presents with  . Fall    HISTORY OF PRESENT ILLNESS:  Brandon Calhoun  is a 83 y.o. male with a known history of Bronchial asthma, hypertension, GERD presented to the emergency room for fall. Patient lives in assisted living facility in the apartment. He lost balance and fell down at 9:30 PM yesterday night. No history of any loss of consciousness, head injury. Patient fell on his left hip and had pain in the left hip  area. Patient was brought to the emergency room and was evaluated with x-ray of the hip which showed left femoral neck fracture. Patient was given pain medication in the emergency room to control the hip pain. Case was discussed with orthopedic surgeon by ER physician who recommended surgery. No complaints of any chest pain, shortness of breath. No fever or chills and cough. Patient is independent in activities of daily living. Patient does not use a cane or a walker and still ambulates independently. Hospitalist service was consulted for further care of the patient. The pain in the hip is aching in nature initially was 7 out of 10 on a scale of 1-10. The pain was relieved when IV morphine medication was given in the emergency room.   VITAL SIGNS:  Blood pressure (!) 121/57, pulse 78, temperature 98.3 F (36.8 C), temperature source Oral, resp. rate 16, height 5\' 9"  (1.753 m), weight 67.1 kg (148 lb), SpO2 95 %.  I/O:   Intake/Output Summary (Last 24 hours) at 07/09/16 0813 Last data filed at 07/08/16 1901  Gross per 24 hour  Intake  360 ml  Output              550 ml  Net             -190 ml    PHYSICAL EXAMINATION:   GENERAL:  83 y.o.-year-old patient lying in the bed with no acute distress.  EYES: Pupils equal, round, reactive to light and accommodation. No scleral icterus. Extraocular muscles intact.  HEENT: Head atraumatic, normocephalic. Oropharynx and nasopharynx clear.  NECK:  Supple, no jugular venous distention. No thyroid enlargement, no tenderness.  LUNGS: Normal breath sounds bilaterally, no wheezing, rales,rhonchi or crepitation. No use of accessory muscles of respiration.  CARDIOVASCULAR: S1, S2 normal. No murmurs, rubs, or gallops.  ABDOMEN: Soft, nontender, nondistended. Bowel sounds present. No organomegaly or mass.  EXTREMITIES: No pedal edema, cyanosis, or clubbing.  NEUROLOGIC: Cranial nerves II through XII are intact. Muscle strength 5/5 in all  extremities. Sensation intact. Gait not checked.  PSYCHIATRIC: The patient is alert and oriented x 3.  SKIN: No obvious rash, lesion, or ulcer.    DATA REVIEW:   CBC  Recent Labs Lab 07/07/16 0418  WBC 8.5  HGB 11.2*  HCT 32.4*  PLT 196    Chemistries   Recent Labs Lab 07/06/16 0015  07/07/16 0418  NA 134*  < > 133*  K 4.2  < > 4.1  CL 105  < > 105  CO2 23  < > 24  GLUCOSE 137*  < > 101*  BUN 23*  < > 14  CREATININE 0.81  < > 0.77  CALCIUM 8.6*  < > 7.6*  AST 23  --   --   ALT 23  --   --   ALKPHOS 69  --   --   BILITOT 0.8  --   --   < > = values in this interval not displayed.  Cardiac Enzymes No results for input(s): TROPONINI in the last 168 hours.  Microbiology Results  Results for orders placed or performed during the hospital encounter of 07/05/16  Surgical pcr screen     Status: None   Collection Time: 07/06/16  9:15 AM  Result Value Ref Range Status   MRSA, PCR NEGATIVE NEGATIVE Final   Staphylococcus aureus NEGATIVE NEGATIVE Final    Comment:        The Xpert SA Assay (FDA approved for NASAL specimens in patients over 69 years of age), is one component of a comprehensive surveillance program.  Test performance has been validated by [facility] for patients greater than or equal to 67 year old. It is not intended to diagnose infection nor to guide or monitor treatment.     RADIOLOGY:  No results found.  EKG:   Orders placed or performed during the hospital encounter of 07/05/16  . EKG 12-Lead  . EKG 12-Lead  . ED EKG  . ED EKG      Management plans discussed with the patient, family and they are in agreement.  CODE STATUS:     Code Status Orders        Start     Ordered   07/06/16 0412  Full code  Continuous     07/06/16 0411    Code Status History    Date Active Date Inactive Code Status Order ID Comments User Context   This patient has a current code status but no historical code status.    Advance Directive  Documentation   Flowsheet Row Most Recent Value  Type  of Customer service manager Power of Attorney  Pre-existing out of facility DNR order (yellow form or pink MOST form)  No data  "MOST" Form in Place?  No data      TOTAL TIME TAKING CARE OF THIS PATIENT: 35 minutes.    Altamese Dilling M.D on 07/09/2016 at 8:13 AM  Between 7am to 6pm - Pager - [phone]  After 6pm go to [email or web address] - password [email or web address]  [facility]  Office  [phone]  CC: Primary care physician; [facility]   Note: This dictation was prepared with Dragon dictation along with smaller phrase technology. Any transcriptional errors that result from this process are unintentional.

## 2016-07-09 NOTE — Clinical Social Work Placement (Signed)
   CLINICAL SOCIAL WORK PLACEMENT  NOTE  Date:  07/09/2016  Patient Details  Name: Brandon Calhoun MRN: [id number] Date of Birth: [birth year]  Clinical Social Work is seeking post-discharge placement for this patient at the Skilled  Nursing Facility level of care (*CSW will initial, date and re-position this form in  chart as items are completed):  Yes   Patient/family provided with [facility] Clinical Social Work Department's list of facilities offering this level of care within the geographic area requested by the patient (or if unable, by the patient's family).  Yes   Patient/family informed of their freedom to choose among providers that offer the needed level of care, that participate in Medicare, Medicaid or managed care program needed by the patient, have an available bed and are willing to accept the patient.  Yes   Patient/family informed of [facility]'s ownership interest in [facility] and [facility], as well as of the fact that they are under no obligation to receive care at these facilities.  PASRR submitted to EDS on 07/06/16     PASRR number received on 07/06/16     Existing PASRR number confirmed on       FL2 transmitted to all facilities in geographic area requested by pt/family on 07/06/16     FL2 transmitted to all facilities within larger geographic area on       Patient informed that his/her managed care company has contracts with or will negotiate with certain facilities, including the following:        Yes   Patient/family informed of bed offers received.  Patient chooses bed at  [facility] [facility]     Physician recommends and patient chooses bed at      Patient to be transferred to  [facility] [facility] on 07/09/16.  Patient to be transferred to facility by  [facility] )     Patient family notified on 07/09/16 of transfer.  Name of family member notified:   (Patient's daughter Darl PikesSusan is aware of D/C today. )     PHYSICIAN        Additional Comment:    _______________________________________________ Nevia Henkin, Darleen CrockerBailey M, LCSW 07/09/2016, 2:46 PM

## 2016-07-09 NOTE — NC FL2 (Signed)
[place] MEDICAID FL2 LEVEL OF CARE SCREENING TOOL     IDENTIFICATION  Patient Name: Brandon Calhoun Birthdate: 1930-01-17 Sex: male Admission Date (Current Location): 07/05/2016  [place] Number:  Chiropodist and Address:  [facility], [address] [place], [place] [address]      Provider Number: [id number]  Attending Physician Name and Address:  Altamese Dilling, MD  Relative Name and Phone Number:       Current Level of Care: Hospital Recommended Level of Care: Skilled Nursing Facility Prior Approval Number:    Date Approved/Denied:   PASRR Number:  [id number])  Discharge Plan: SNF    Current Diagnoses: Patient Active Problem List   Diagnosis Date Noted  . Hip fracture (HCC) 07/06/2016    Orientation RESPIRATION BLADDER Height & Weight     Self, Time, Situation, Place  Normal Continent Weight: 148 lb (67.1 kg) Height:  5\' 9"  (175.3 cm)  BEHAVIORAL SYMPTOMS/MOOD NEUROLOGICAL BOWEL NUTRITION STATUS   (none)  (none) Continent Diet (NPO for surgery )  AMBULATORY STATUS COMMUNICATION OF NEEDS Skin   Extensive Assist Verbally Surgical wounds                       Personal Care Assistance Level of Assistance  Bathing, Feeding, Dressing Bathing Assistance: Limited assistance Feeding assistance: Independent Dressing Assistance: Limited assistance     Functional Limitations Info  Sight, Hearing, Speech Sight Info: Adequate Hearing Info: Adequate Speech Info: Adequate    SPECIAL CARE FACTORS FREQUENCY  PT (By licensed PT), OT (By licensed OT)     PT Frequency:  (5) OT Frequency:  (5)            Contractures      Additional Factors Info  Code Status, Allergies Code Status Info:  (Full Code. ) Allergies Info:  (No Known Allergies. )           Current Medications (07/09/2016):  This is the current hospital active medication list Current Facility-Administered Medications  Medication  Dose Route Frequency Provider Last Rate Last Dose  . 0.9 %  sodium chloride infusion   Intravenous Continuous Kennedy Bucker, MD   Stopped at 07/08/16 0000  . acetaminophen (TYLENOL) tablet 650 mg  650 mg Oral Q6H PRN Ihor Austin, MD   650 mg at 07/09/16 0740   Or  . acetaminophen (TYLENOL) suppository 650 mg  650 mg Rectal Q6H PRN Ihor Austin, MD      . alum & mag hydroxide-simeth (MAALOX/MYLANTA) 200-200-20 MG/5ML suspension 30 mL  30 mL Oral Q4H PRN Kennedy Bucker, MD      . artificial tears (LACRILUBE) ophthalmic ointment 1 application  1 application Both Eyes PRN Ihor Austin, MD      . diphenhydrAMINE (BENADRYL) 12.5 MG/5ML elixir 12.5-25 mg  12.5-25 mg Oral Q4H PRN Kennedy Bucker, MD      . docusate sodium (COLACE) capsule 100 mg  100 mg Oral BID Kennedy Bucker, MD   100 mg at 07/09/16 1115  . enoxaparin (LOVENOX) injection 40 mg  40 mg Subcutaneous Q24H Kennedy Bucker, MD   40 mg at 07/09/16 0740  . famotidine (PEPCID) tablet 10 mg  10 mg Oral Daily Ihor Austin, MD   10 mg at 07/08/16 2032  . HYDROcodone-acetaminophen (NORCO/VICODIN) 5-325 MG per tablet 1-2 tablet  1-2 tablet Oral Q4H PRN Ihor Austin, MD   1 tablet at 07/06/16 2137  . lisinopril (PRINIVIL,ZESTRIL) tablet 10 mg  10 mg  Oral Daily Ihor AustinPavan Pyreddy, MD   10 mg at 07/09/16 1115  . magnesium citrate solution 1 Bottle  1 Bottle Oral Once PRN Kennedy BuckerMichael Menz, MD      . magnesium hydroxide (MILK OF MAGNESIA) suspension 30 mL  30 mL Oral Daily PRN Kennedy BuckerMichael Menz, MD      . menthol-cetylpyridinium (CEPACOL) lozenge 3 mg  1 lozenge Oral PRN Kennedy BuckerMichael Menz, MD       Or  . phenol (CHLORASEPTIC) mouth spray 1 spray  1 spray Mouth/Throat PRN Kennedy BuckerMichael Menz, MD      . mometasone-formoterol [facility](DULERA) 200-5 MCG/ACT inhaler 2 puff  2 puff Inhalation BID Ihor AustinPavan Pyreddy, MD   2 puff at 07/09/16 0740  . morphine 2 MG/ML injection 2 mg  2 mg Intravenous Q4H PRN Ihor AustinPavan Pyreddy, MD   2 mg at 07/06/16 1352  . ondansetron (ZOFRAN) tablet 4 mg  4 mg Oral Q6H PRN  Ihor AustinPavan Pyreddy, MD       Or  . ondansetron (ZOFRAN) injection 4 mg  4 mg Intravenous Q6H PRN Pavan Pyreddy, MD      . oxyCODONE (Oxy IR/ROXICODONE) immediate release tablet 5-10 mg  5-10 mg Oral Q3H PRN Kennedy BuckerMichael Menz, MD   10 mg at 07/06/16 2227  . senna-docusate (Senokot-S) tablet 1 tablet  1 tablet Oral QHS PRN Ihor AustinPavan Pyreddy, MD         Discharge Medications: Please see discharge summary for a list of discharge medications.  Relevant Imaging Results:  Relevant Lab Results:   Additional Information  (SSN: [id number])  Sample, Darleen CrockerBailey M, LCSW

## 2016-07-09 NOTE — Progress Notes (Signed)
Occupational Therapy Treatment Patient Details Name: Brandon Calhoun MRN: [id number] DOB: Sep 15, 1929 Today's Date: 07/09/2016    History of present illness Pt is an 83 y.o. male presenting to hospital s/p mechanical fall with L hip pain.  Pt s/p L hip hemiarthroplasty 07/06/16 secondary to displaced femoral neck fx.  PMH includes htn and asthma.   OT comments  Pt agreeable and motivated to participate in OT session focused on LB dressing tasks using A/E. Daughter present for half of session and eager to participate as well. Pt and dtr educated in A/E for LB ADL, conservative use of power lift chair at home to minimize weakness/dependence on it for transfers, falls prevention education, and benefits of a shower chair to support energy conservation and falls prevention. Pt progressing with decreased assistance required for sup>sit EOB (mod to min assist) and min-mod assist for LB dressing using AE and with cuing for hip precautions and compensatory techniques as well as additional time to complete. Pt and dtr very thankful for education/training. Recommend shower chair with bilateral arm rails and back rest for shower at ALF to minimize falls risk and for energy conservation. Pt continues to benefit from skilled OT services to address current impairments and functional deficits in order to return to PLOF and minimize risk of future falls and rehospitalization.    Follow Up Recommendations  SNF    Equipment Recommendations  Tub/shower seat    Recommendations for Other Services      Precautions / Restrictions Precautions Precautions: Anterior Hip;Fall Precaution Booklet Issued: Yes (comment) Restrictions Weight Bearing Restrictions: Yes LLE Weight Bearing: Weight bearing as tolerated       Mobility Bed Mobility Overal bed mobility: Needs Assistance Bed Mobility: Supine to Sit     Supine to sit: Min assist;HOB elevated     General bed mobility comments: Sup>sit EOB with min assist  and verbal cues for hand placement on bed rails  Transfers Overall transfer level: Needs assistance Equipment used: Rolling walker (2 wheeled) Transfers: Sit to/from Stand Sit to Stand: Min assist         General transfer comment: Cues for technique and hand placement with sit to stand and stand to sit.     Balance Overall balance assessment: History of Falls;Needs assistance Sitting-balance support: No upper extremity supported;Feet supported Sitting balance-Leahy Scale: Fair     Standing balance support: Bilateral upper extremity supported Standing balance-Leahy Scale: Fair                     ADL Overall ADL's : Needs assistance/impaired Eating/Feeding: Modified independent;Set up Eating/Feeding Details (indicate cue type and reason): Pt mod I after set up to complete meal                 Lower Body Dressing: With adaptive equipment;Moderate assistance;Adhering to hip precautions;Cueing for compensatory techniques;Sitting/lateral leans;Minimal assistance Lower Body Dressing Details (indicate cue type and reason): Pt performed LB dressing including don/doffing socks and shoes using AE (reacher, sock aid, long handled shoe horn) after instruction (visual demonstration and verbal) with min-moderate assistance and verbal cues for compensatory strategies              Functional mobility during ADLs: Minimal assistance;Rolling walker;Cueing for safety (cueing for hand placement during mobility) General ADL Comments: Educated daughter in AE for LB dressing as well, dtr ordered AE for pt during session      Vision  Perception     Praxis      Cognition   Behavior During Therapy: WFL for tasks assessed/performed Overall Cognitive Status: Within Functional Limits for tasks assessed                       Extremity/Trunk Assessment               Exercises Other Exercises Other Exercises: Pt and daughter educated in  AE for LB dressing, use of power lift chair at home to minimize weakness/dependence on it for transfers, falls prevention education, benefits of a shower chair to support ECS and falls prevention   Shoulder Instructions       General Comments      Pertinent Vitals/ Pain       Pain Assessment: 0-10 Pain Score: 3  (0/10 at rest in bed, increasing to 3/10 with mobility during ADL tasks) Pain Location: L hip Pain Descriptors / Indicators: Aching;Sore Pain Intervention(s): Limited activity within patient's tolerance;Monitored during session;Repositioned  Home Living Family/patient expects to be discharged to:: Assisted living     Type of Home: Assisted living                       Home Equipment: None   Additional Comments: [facility]      Prior Functioning/Environment Level of Independence: Independent        Comments: Pt reports no other falls (other than fall prior to this hospital admission) in last 6 months.  Pt receives assist for medication management.  Was previously independent with basic self care, assist for meals, cleaning of apartment and medication.   Frequency  Min 1X/week        Progress Toward Goals  OT Goals(current goals can now be found in the care plan section)  Progress towards OT goals: Progressing toward goals  Acute Rehab OT Goals Patient Stated Goal: "To be able to take care of myself and do the things I did before." OT Goal Formulation: With patient/family Time For Goal Achievement: 07/18/16 Potential to Achieve Goals: Good  Plan Discharge plan remains appropriate;Frequency remains appropriate;Equipment recommendations need to be updated    Co-evaluation                 End of Session Equipment Utilized During Treatment: Gait belt;Rolling walker   Activity Tolerance Patient tolerated treatment well   Patient Left Other (comment);with call bell/phone within reach;with family/visitor present (Left seated  EOB with bilat BUE support with dtr present, PT present)   Nurse Communication          Time: 1610-9604: 0834-0943 OT Time Calculation (min): 69 min  Charges: OT General Charges $OT Visit: 1 Procedure OT Treatments $Self Care/Home Management : 68-82 mins  Eliezer BottomJamie L Stiller, OTR/L 07/09/2016, 10:03 AM

## 2016-07-09 NOTE — Discharge Instructions (Signed)
Diet: As you were doing prior to hospitalization   Shower:  May shower but keep the wounds dry, use an occlusive plastic wrap, NO SOAKING IN TUB.  If the bandage gets wet, change with a clean dry gauze.  Dressing:  You may change your dressing as needed. Change the dressing with sterile gauze dressing.    Activity:  Increase activity slowly as tolerated, but follow the weight bearing instructions below.  No lifting or driving for 6 weeks.  Weight Bearing:   Weight bearing as tolerated to left lower extremity  To prevent constipation: you may use a stool softener such as -  Colace (over the counter) 100 mg by mouth twice a day  Drink plenty of fluids (prune juice may be helpful) and high fiber foods Miralax (over the counter) for constipation as needed.    Itching:  If you experience itching with your medications, try taking only a single pain pill, or even half a pain pill at a time.  You may take up to 10 pain pills per day, and you can also use benadryl over the counter for itching or also to help with sleep.   Precautions:  If you experience chest pain or shortness of breath - call 911 immediately for transfer to the hospital emergency department!!  If you develop a fever greater that 101 F, purulent drainage from wound, increased redness or drainage from wound, or calf pain-Call [facility].                                               Follow- Up Appointment:  Please call for an appointment to be [facility]  Staples can be removed by the SNF on 07/18/16.  [facility] in 6 weeks for x-rays of the left hip. Continue Lovenox 40mg  daily x 14 days.

## 2016-07-09 NOTE — Progress Notes (Signed)
Patient is medically stable for D/C to [facility] today. Per Sue LushAndrea admissions coordinator at [facility] patient will go to room 230. RN will call report at [phone] and arrange EMS for transport. [facility]. Auth # [id number], RVB, next review date 07/11/16. Clinical Child psychotherapistocial Worker (CSW) sent D/C orders to [employer] via [employer]. Patient is aware of above. Patient's daughter Darl PikesSusan is at bedside and aware of D/C today. Please reconsult if future social work needs arise. CSW signing off.   [employer]Bailey Ozelle Brubacher, LCSW [phone]

## 2016-07-09 NOTE — Plan of Care (Signed)
Problem: Acute Rehab OT Goals (only OT should resolve) Goal: Pt. Will Perform Lower Body Dressing Patient will complete lower body dressing with min assist using adaptive equipment as needed.  Outcome: Progressing Pt performed LB dressing with min-moderate assist and verbal cues for technique while seated EOB using AE.

## 2016-07-09 NOTE — Progress Notes (Signed)
  Subjective: 3 Days Post-Op Procedure(s) (LRB): ARTHROPLASTY BIPOLAR HIP (HEMIARTHROPLASTY) (Left) Patient reports pain as mild.   Patient is well, and has had no acute complaints or problems PT and Care management to assist with patient discharge.  Currently recommending SNF upon discharge. Negative for chest pain and shortness of breath Fever: no Gastrointestinal:Negative for nausea and vomiting  Objective: Vital signs in last 24 hours: Temp:  [97.4 F (36.3 C)-98.8 F (37.1 C)] 98.3 F (36.8 C) (01/22 0738) Pulse Rate:  [73-91] 78 (01/22 0738) Resp:  [16-19] 16 (01/22 0738) BP: (114-142)/(52-64) 121/57 (01/22 0738) SpO2:  [93 %-99 %] 95 % (01/22 0738)  Intake/Output from previous day:  Intake/Output Summary (Last 24 hours) at 07/09/16 0740 Last data filed at 07/08/16 1901  Gross per 24 hour  Intake              360 ml  Output              550 ml  Net             -190 ml    Intake/Output this shift: No intake/output data recorded.  Labs:  Recent Labs  07/07/16 0418  HGB 11.2*    Recent Labs  07/07/16 0418  WBC 8.5  RBC 3.55*  HCT 32.4*  PLT 196    Recent Labs  07/07/16 0418  NA 133*  K 4.1  CL 105  CO2 24  BUN 14  CREATININE 0.77  GLUCOSE 101*  CALCIUM 7.6*   No results for input(s): LABPT, INR in the last 72 hours.   EXAM General - Patient is Alert, Appropriate and Oriented Extremity - ABD soft Neurovascular intact Sensation intact distally Dorsiflexion/Plantar flexion intact Incision: dressing C/D/I No cellulitis present  Dressing/Incision - clean, dry, no drainage Motor Function - intact, moving foot and toes well on exam.  Abdomen soft with normal BS.  Past Medical History:  Diagnosis Date  . Asthma   . Edema    MILD OF FEET  . Edema    MILD OF FEET  . GERD (gastroesophageal reflux disease)   . Hypertension     Assessment/Plan: 3 Days Post-Op Procedure(s) (LRB): ARTHROPLASTY BIPOLAR HIP (HEMIARTHROPLASTY)  (Left) Principal Problem:   Hip fracture (HCC)  Estimated body mass index is 21.86 kg/m as calculated from the following:   Height as of this encounter: 5\' 9"  (1.753 m).   Weight as of this encounter: 67.1 kg (148 lb). Up with therapy  Up with therapy today, currently recommending SNF for discharge. Labs not updated since 07/07/16.  Placed order for both a CBC and BMP today.  Staples can be removed by the SNF on 07/18/16.  [facility] in 6 weeks for x-rays of the left hip. Continue Lovenox 40mg  daily x 14 days.  Pt can be discharged when medically stable according to internal medicine.  DVT Prophylaxis - Lovenox, Foot Pumps and TED hose Weight-Bearing as tolerated to left leg  J. Horris LatinoLance Thaison Kolodziejski, PA-C [facility] 07/09/2016, 7:40 AM

## 2016-07-09 NOTE — Progress Notes (Signed)
Physical Therapy Treatment Patient Details Name: Brandon Calhoun MRN: [id number] DOB: 1930-03-06 Today's Date: 07/09/2016    History of Present Illness Pt is an 83 y.o. male presenting to hospital s/p mechanical fall with L hip pain.  Pt s/p L hip hemiarthroplasty 07/06/16 secondary to displaced femoral neck fx.  PMH includes htn and asthma.    PT Comments    Pt able to progress to ambulating 40 feet with RW CGA to min assist x1.  With vc's for gait technique pt able to improve B LE step length.  Improved balance noted overall with session's activities.  Will continue to progress pt with strengthening and progressive ambulation distance per pt tolerance.  Pt reports plan to discharge to [facility] today.   Follow Up Recommendations  SNF     Equipment Recommendations  Rolling walker with 5" wheels    Recommendations for Other Services       Precautions / Restrictions Precautions Precautions: Anterior Hip;Fall Precaution Booklet Issued: Yes (comment) Restrictions Weight Bearing Restrictions: Yes LLE Weight Bearing: Weight bearing as tolerated    Mobility  Bed Mobility Overal bed mobility: Needs Assistance Bed Mobility: Sit to Supine     Sit to supine: Mod assist;Max assist   General bed mobility comments: assist for trunk and B LE's; vc's for use of bed rail; vc's for scooting up in bed (using R LE and UE's) CGA and pt bridging to scoot hips to R  Transfers Overall transfer level: Needs assistance Equipment used: Rolling walker (2 wheeled) Transfers: Sit to/from Stand Sit to Stand: Min assist         General transfer comment: Vc's for hand and feet placement  Ambulation/Gait Ambulation/Gait assistance: Min guard;Min assist Ambulation Distance (Feet): 40 Feet Assistive device: Rolling walker (2 wheeled) Gait Pattern/deviations: Step-to pattern;Decreased stance time - left;Decreased step length - right;Decreased step length - left;Antalgic Gait velocity: decreased    General Gait Details: vc's for gait technique and to increase UE support through RW to offweight L LE   Stairs            Wheelchair Mobility    Modified Rankin (Stroke Patients Only)       Balance Overall balance assessment: History of Falls;Needs assistance Sitting-balance support: Bilateral upper extremity supported;Feet supported Sitting balance-Leahy Scale: Fair Sitting balance - Comments: pt requiring vc's and repositioning for sitting posture to reduce posterior lean   Standing balance support: Bilateral upper extremity supported (on RW) Standing balance-Leahy Scale: Fair                      Cognition Arousal/Alertness: Awake/alert Behavior During Therapy: WFL for tasks assessed/performed Overall Cognitive Status: Within Functional Limits for tasks assessed                      Exercises     General Comments General comments (skin integrity, edema, etc.): Pt's daughter present during session.  Pt agreeable to PT session (although pt reporting fatigue d/t just finishing session with OT--pt sitting on edge of bed with OT present upon PT entering room).      Pertinent Vitals/Pain Pain Assessment: 0-10 Pain Score: 3  Pain Location: L hip Pain Descriptors / Indicators: Aching;Sore Pain Intervention(s): Limited activity within patient's tolerance;Monitored during session;Premedicated before session;Repositioned  Vitals (HR and O2) stable and WFL throughout treatment session.    Home Living Family/patient expects to be discharged to:: Assisted living     Type of Home: Assisted living  Home Equipment: None Additional Comments: Pt resides at [facility]    Prior Function Level of Independence: Independent        PT Goals (current goals can now be found in the care plan section) Acute Rehab PT Goals Patient Stated Goal: To be able to walk farther PT Goal Formulation: With patient Time For Goal Achievement: 07/21/16 Potential  to Achieve Goals: Good Progress towards PT goals: Progressing toward goals    Frequency    BID      PT Plan Current plan remains appropriate    Co-evaluation             End of Session Equipment Utilized During Treatment: Gait belt Activity Tolerance: Patient tolerated treatment well Patient left: in bed;with call bell/phone within reach;with bed alarm set;with family/visitor present;with SCD's reapplied (B heels elevated via pillow)     Time: 1610-9604 PT Time Calculation (min) (ACUTE ONLY): 27 min  Charges:  $Gait Training: 8-22 mins $Therapeutic Activity: 8-22 mins                    G CodesHendricks Limes July 15, 2016, 10:41 AM Hendricks Limes, PT [phone]

## 2016-07-09 NOTE — Progress Notes (Signed)
RN gave report to RN at [facility]. All questions answered. Patient alert and oriented. No pain at this time.  EMS called for transport.  Harvie HeckMelanie Lacresha Fusilier, RN

## 2016-07-10 LAB — SURGICAL PATHOLOGY

## 2016-07-12 DIAGNOSIS — J45901 Unspecified asthma with (acute) exacerbation: Secondary | ICD-10-CM

## 2016-07-12 DIAGNOSIS — R509 Fever, unspecified: Secondary | ICD-10-CM

## 2016-07-12 DIAGNOSIS — I10 Essential (primary) hypertension: Secondary | ICD-10-CM

## 2016-07-12 DIAGNOSIS — K219 Gastro-esophageal reflux disease without esophagitis: Secondary | ICD-10-CM

## 2016-07-12 DIAGNOSIS — S72012A Unspecified intracapsular fracture of left femur, initial encounter for closed fracture: Secondary | ICD-10-CM

## 2016-07-30 DIAGNOSIS — Z9181 History of falling: Secondary | ICD-10-CM | POA: Diagnosis not present

## 2016-07-30 DIAGNOSIS — I10 Essential (primary) hypertension: Secondary | ICD-10-CM | POA: Diagnosis not present

## 2016-07-30 DIAGNOSIS — S72002D Fracture of unspecified part of neck of left femur, subsequent encounter for closed fracture with routine healing: Secondary | ICD-10-CM | POA: Diagnosis not present

## 2016-07-30 DIAGNOSIS — J45909 Unspecified asthma, uncomplicated: Secondary | ICD-10-CM | POA: Diagnosis not present

## 2016-08-02 DIAGNOSIS — S72002D Fracture of unspecified part of neck of left femur, subsequent encounter for closed fracture with routine healing: Secondary | ICD-10-CM | POA: Diagnosis not present

## 2016-08-02 DIAGNOSIS — I10 Essential (primary) hypertension: Secondary | ICD-10-CM | POA: Diagnosis not present

## 2016-08-06 DIAGNOSIS — Z96612 Presence of left artificial shoulder joint: Secondary | ICD-10-CM | POA: Diagnosis not present

## 2016-08-20 ENCOUNTER — Emergency Department: Payer: Medicare Other

## 2016-08-20 ENCOUNTER — Emergency Department
Admission: EM | Admit: 2016-08-20 | Discharge: 2016-08-20 | Disposition: A | Discharge status: Home / Self Care | Payer: Medicare Other | Attending: Emergency Medicine | Admitting: Emergency Medicine

## 2016-08-20 DIAGNOSIS — I10 Essential (primary) hypertension: Secondary | ICD-10-CM | POA: Insufficient documentation

## 2016-08-20 DIAGNOSIS — I824Z2 Acute embolism and thrombosis of unspecified deep veins of left distal lower extremity: Secondary | ICD-10-CM | POA: Diagnosis not present

## 2016-08-20 DIAGNOSIS — Z79899 Other long term (current) drug therapy: Secondary | ICD-10-CM | POA: Insufficient documentation

## 2016-08-20 DIAGNOSIS — R600 Localized edema: Secondary | ICD-10-CM

## 2016-08-20 DIAGNOSIS — J45909 Unspecified asthma, uncomplicated: Secondary | ICD-10-CM | POA: Insufficient documentation

## 2016-08-20 DIAGNOSIS — I82412 Acute embolism and thrombosis of left femoral vein: Secondary | ICD-10-CM

## 2016-08-20 DIAGNOSIS — R2242 Localized swelling, mass and lump, left lower limb: Secondary | ICD-10-CM | POA: Diagnosis not present

## 2016-08-20 DIAGNOSIS — R609 Edema, unspecified: Secondary | ICD-10-CM

## 2016-08-20 DIAGNOSIS — S8002XA Contusion of left knee, initial encounter: Secondary | ICD-10-CM | POA: Diagnosis not present

## 2016-08-20 DIAGNOSIS — M7989 Other specified soft tissue disorders: Secondary | ICD-10-CM | POA: Diagnosis present

## 2016-08-20 LAB — CBC WITH DIFFERENTIAL/PLATELET
BASOS PCT: 0 %
Basophils Absolute: 0 10*3/uL (ref 0–0.1)
EOS ABS: 0.1 10*3/uL (ref 0–0.7)
Eosinophils Relative: 1 %
HCT: 35.9 % — ABNORMAL LOW (ref 40.0–52.0)
HEMOGLOBIN: 12.4 g/dL — AB (ref 13.0–18.0)
Lymphocytes Relative: 16 %
Lymphs Abs: 1.8 10*3/uL (ref 1.0–3.6)
MCH: 31.2 pg (ref 26.0–34.0)
MCHC: 34.5 g/dL (ref 32.0–36.0)
MCV: 90.4 fL (ref 80.0–100.0)
MONOS PCT: 7 %
Monocytes Absolute: 0.7 10*3/uL (ref 0.2–1.0)
NEUTROS PCT: 76 %
Neutro Abs: 8.3 10*3/uL — ABNORMAL HIGH (ref 1.4–6.5)
Platelets: 346 10*3/uL (ref 150–440)
RBC: 3.97 MIL/uL — AB (ref 4.40–5.90)
RDW: 14.9 % — ABNORMAL HIGH (ref 11.5–14.5)
WBC: 10.9 10*3/uL — AB (ref 3.8–10.6)

## 2016-08-20 LAB — URINALYSIS, COMPLETE (UACMP) WITH MICROSCOPIC
BILIRUBIN URINE: NEGATIVE
Bacteria, UA: NONE SEEN
GLUCOSE, UA: NEGATIVE mg/dL
HGB URINE DIPSTICK: NEGATIVE
KETONES UR: NEGATIVE mg/dL
LEUKOCYTES UA: NEGATIVE
NITRITE: NEGATIVE
Protein, ur: NEGATIVE mg/dL
RBC / HPF: NONE SEEN RBC/hpf (ref 0–5)
Specific Gravity, Urine: 1.017 (ref 1.005–1.030)
Squamous Epithelial / LPF: NONE SEEN
pH: 7 (ref 5.0–8.0)

## 2016-08-20 LAB — COMPREHENSIVE METABOLIC PANEL
ALBUMIN: 3.6 g/dL (ref 3.5–5.0)
ALK PHOS: 114 U/L (ref 38–126)
ALT: 28 U/L (ref 17–63)
ANION GAP: 7 (ref 5–15)
AST: 23 U/L (ref 15–41)
BUN: 20 mg/dL (ref 6–20)
CALCIUM: 8.6 mg/dL — AB (ref 8.9–10.3)
CO2: 24 mmol/L (ref 22–32)
Chloride: 106 mmol/L (ref 101–111)
Creatinine, Ser: 0.72 mg/dL (ref 0.61–1.24)
GFR calc Af Amer: >60 mL/min (ref >60)
GFR calc non Af Amer: >60 mL/min (ref >60)
GLUCOSE: 113 mg/dL — AB (ref 65–99)
Potassium: 3.7 mmol/L (ref 3.5–5.1)
SODIUM: 137 mmol/L (ref 135–145)
Total Bilirubin: 0.7 mg/dL (ref 0.3–1.2)
Total Protein: 6.5 g/dL (ref 6.5–8.1)

## 2016-08-20 LAB — BRAIN NATRIURETIC PEPTIDE: B Natriuretic Peptide: 118 pg/mL — ABNORMAL HIGH (ref 0.0–100.0)

## 2016-08-20 LAB — TROPONIN I: Troponin I: <0.03 ng/mL (ref <0.03)

## 2016-08-20 MED ORDER — APIXABAN 5 MG PO TABS: ORAL_TABLET | ORAL | 0 refills | Status: DC

## 2016-08-20 NOTE — ED Notes (Signed)
Assisted patient to use restroom. Patient able to ambulate and stand at toilet with assistance. Ambulation at baseline per family. Patient denies pain with ambulation.

## 2016-08-20 NOTE — ED Provider Notes (Signed)
[facility] Emergency Department Provider Note  ____________________________________________  Time seen: Approximately 12:31 PM  I have reviewed the triage vital signs and the nursing notes.   HISTORY  Chief Complaint Leg Swelling    HPI Brandon Calhoun is a 83 y.o. male who complains of swelling of the left leg for the past 2 days. He had left hip surgery about 6 weeks ago with Dr. Rosita KeaMenz. He had in-hospital Lovenox but not any anticoagulants after according to the patient. Then, 4 days ago he had a fall in an elevator where he hit his left knee. No last 2 days she's had the above symptoms. Denies chest pain shortness of breath fevers chills dizziness or weakness. Never had anything like this before. Does have some chronic pedal edema bilaterally.     Past Medical History:  Diagnosis Date  . Asthma   . Edema    MILD OF FEET  . Edema    MILD OF FEET  . GERD (gastroesophageal reflux disease)   . Hypertension      Patient Active Problem List   Diagnosis Date Noted  . Hip fracture (HCC) 07/06/2016     Past Surgical History:  Procedure Laterality Date  . CATARACT EXTRACTION W/PHACO Left 02/16/2016   Procedure: CATARACT EXTRACTION PHACO AND INTRAOCULAR LENS PLACEMENT (IOC);  Surgeon: Nevada CraneBradley Mark King, MD;  Location: [facility] ORS;  Service: Ophthalmology;  Laterality: Left;  US 1.10 AP% 10.6 CDE 7.41 Fluid Pack Lot # [id number] H  . CATARACT EXTRACTION W/PHACO Right 03/08/2016   Procedure: CATARACT EXTRACTION PHACO AND INTRAOCULAR LENS PLACEMENT (IOC);  Surgeon: Nevada CraneBradley Mark King, MD;  Location: [facility] ORS;  Service: Ophthalmology;  Laterality: Right;  US 1.14 AP% 11.6 CDE 11.80 Fluid Pack Lot # [id number] H  . HERNIA REPAIR    . HIP ARTHROPLASTY Left 07/06/2016   Procedure: ARTHROPLASTY BIPOLAR HIP (HEMIARTHROPLASTY);  Surgeon: Kennedy BuckerMichael Menz, MD;  Location: [facility] ORS;  Service: Orthopedics;  Laterality: Left;     Prior to Admission medications   Medication  Sig Start Date End Date Taking? Authorizing Provider  acetaminophen (TYLENOL) 325 MG tablet Take 650 mg by mouth every 6 (six) hours as needed.    Historical Provider, MD  alum & mag hydroxide-simeth (MAALOX/MYLANTA) 200-200-20 MG/5ML suspension Take 10 mLs by mouth as needed for indigestion or heartburn.    Historical Provider, MD  apixaban (ELIQUIS) 5 MG TABS tablet 10mg  (2 tablets) by mouth twice a day for 7 days, then 5mg  (1 tablet) by mouth twice a day thereafter. 08/20/16   Sharman CheekPhillip Jarrius Huaracha, MD  Artificial Tear Ointment (ARTIFICIAL TEARS) ointment Place 1 application into both eyes as needed.    Historical Provider, MD  bismuth subsalicylate (PEPTO BISMOL) 262 MG/15ML suspension Take 10 mLs by mouth 3 (three) times daily as needed.    Historical Provider, MD  budesonide-formoterol (SYMBICORT) 160-4.5 MCG/ACT inhaler Inhale 2 puffs into the lungs 2 (two) times daily.    Historical Provider, MD  carbamide peroxide (DEBROX) 6.5 % otic solution Place 5 drops into both ears as needed.    Historical Provider, MD  clobetasol ointment (TEMOVATE) 0.05 % Apply 1 application topically 2 (two) times daily. AS NEEDED    Historical Provider, MD  diphenhydrAMINE (BENADRYL) 25 MG tablet Take 25 mg by mouth every 4 (four) hours as needed.    Historical Provider, MD  guaiFENesin-dextromethorphan (ROBITUSSIN DM) 100-10 MG/5ML syrup Take 10 mLs by mouth every 4 (four) hours as needed for cough.    Historical Provider, MD  HYDROcodone-acetaminophen (NORCO/VICODIN) 5-325 MG tablet Take 1-2 tablets by mouth every 6 (six) hours as needed for moderate pain or severe pain. 07/09/16   Altamese Dilling, MD  lisinopril (PRINIVIL,ZESTRIL) 10 MG tablet Take 10 mg by mouth daily.    Historical Provider, MD  magnesium citrate SOLN Take 296 mLs (1 Bottle total) by mouth daily as needed for severe constipation. 07/09/16   Altamese Dilling, MD  magnesium hydroxide (MILK OF MAGNESIA) 400 MG/5ML suspension Take 10 mLs by mouth  daily as needed for mild constipation.    Historical Provider, MD  nystatin (NYSTATIN) powder Apply 1 g topically 2 (two) times daily as needed.    Historical Provider, MD  ranitidine (ZANTAC) 300 MG capsule Take 300 mg by mouth every evening.    Historical Provider, MD  senna-docusate (SENOKOT-S) 8.6-50 MG tablet Take 1 tablet by mouth at bedtime as needed for mild constipation. 07/09/16   Altamese Dilling, MD     Allergies Patient has no known allergies.   Family History  Problem Relation Age of Onset  . Hypertension Neg Hx   . Diabetes Neg Hx   . Heart disease Neg Hx     Social History Social History  Substance Use Topics  . Smoking status: Never Smoker  . Smokeless tobacco: Never Used  . Alcohol use No    Review of Systems  Constitutional:   No fever or chills.  ENT:   No sore throat. No rhinorrhea. Cardiovascular:   No chest pain. Respiratory:   No dyspnea or cough. Gastrointestinal:   Negative for abdominal pain, vomiting and diarrhea.  Genitourinary:   Negative for dysuria or difficulty urinating. Musculoskeletal:   Left leg swelling Neurological:   Negative for headaches 10-point ROS otherwise negative.  ____________________________________________   PHYSICAL EXAM:  VITAL SIGNS: ED Triage Vitals  Enc Vitals Group     BP 08/20/16 1137 (!) 130/53     Pulse Rate 08/20/16 1137 72     Resp 08/20/16 1137 18     Temp 08/20/16 1137 98.2 F (36.8 C)     Temp Source 08/20/16 1137 Oral     SpO2 08/20/16 1137 97 %     Weight 08/20/16 1138 150 lb (68 kg)     Height 08/20/16 1138 5\' 9"  (1.753 m)     Head Circumference --      Peak Flow --      Pain Score --      Pain Loc --      Pain Edu? --      Excl. in GC? --     Vital signs reviewed, nursing assessments reviewed.   Constitutional:   Alert and oriented. Well appearing and in no distress. Eyes:   No scleral icterus. No conjunctival pallor. PERRL. EOMI.  No nystagmus. ENT   Head:   Normocephalic  and atraumatic.   Nose:   No congestion/rhinnorhea. No septal hematoma   Mouth/Throat:   MMM, no pharyngeal erythema. No peritonsillar mass.    Neck:   No stridor. No SubQ emphysema. No meningismus. Hematological/Lymphatic/Immunilogical:   No cervical lymphadenopathy. Cardiovascular:   RRR. Symmetric bilateral radial and DP pulses.  No murmurs.  Respiratory:   Normal respiratory effort without tachypnea nor retractions. Breath sounds are clear and equal bilaterally. No wheezes/rales/rhonchi. Gastrointestinal:   Soft and nontender. Non distended. There is no CVA tenderness.  No rebound, rigidity, or guarding. Genitourinary:   deferred Musculoskeletal:   Normal range of motion in all extremities. Slight effusion of left knee.  Abrasion and contusion overlying the inferolateral patella. There is joint line tenderness without focal bony tenderness. No instability. No crepitus.  There is also 1+ daily pitting edema of the right lower extremity in the lower leg ankle and foot. There is 2+ pitting edema of the left lower extremity with faint hazy erythema diffusely below the knee. No warmth. Nontender. Good distal perfusion bilaterally. Neurologic:   Normal speech and language.  CN 2-10 normal. Motor grossly intact. No gross focal neurologic deficits are appreciated.  Skin:    Skin is warm, dry and intact. Left leg redness as above.  No petechiae, purpura, or bullae.  ____________________________________________    LABS (pertinent positives/negatives) (all labs ordered are listed, but only abnormal results are displayed) Labs Reviewed  CBC WITH DIFFERENTIAL/PLATELET - Abnormal; Notable for the following:       Result Value   WBC 10.9 (*)    RBC 3.97 (*)    Hemoglobin 12.4 (*)    HCT 35.9 (*)    RDW 14.9 (*)    Neutro Abs 8.3 (*)    All other components within normal limits  COMPREHENSIVE METABOLIC PANEL - Abnormal; Notable for the following:    Glucose, Bld 113 (*)    Calcium 8.6  (*)    All other components within normal limits  BRAIN NATRIURETIC PEPTIDE - Abnormal; Notable for the following:    B Natriuretic Peptide 118.0 (*)    All other components within normal limits  URINALYSIS, COMPLETE (UACMP) WITH MICROSCOPIC - Abnormal; Notable for the following:    Color, Urine YELLOW (*)    APPearance CLEAR (*)    All other components within normal limits  TROPONIN I   ____________________________________________   EKG    ____________________________________________    RADIOLOGY  US Venous Img Lower Unilateral Left  Result Date: 08/20/2016 CLINICAL DATA:  Lower leg redness and swelling. EXAM: Left LOWER EXTREMITY VENOUS DOPPLER ULTRASOUND TECHNIQUE: Gray-scale sonography with graded compression, as well as color Doppler and duplex ultrasound were performed to evaluate the lower extremity deep venous systems from the level of the common femoral vein and including the common femoral, femoral, profunda femoral, popliteal and calf veins including the posterior tibial, peroneal and gastrocnemius veins when visible. The superficial great saphenous vein was also interrogated. Spectral Doppler was utilized to evaluate flow at rest and with distal augmentation maneuvers in the common femoral, femoral and popliteal veins. COMPARISON:  None. FINDINGS: Contralateral Common Femoral Vein: Respiratory phasicity is normal and symmetric with the symptomatic side. No evidence of thrombus. Normal compressibility. Common Femoral Vein: No evidence of thrombus. Normal compressibility, respiratory phasicity and response to augmentation. Saphenofemoral Junction: No evidence of thrombus. Normal compressibility and flow on color Doppler imaging. Profunda Femoral Vein: No evidence of thrombus. Normal compressibility and flow on color Doppler imaging. Femoral Vein: No evidence of thrombus. Normal compressibility, respiratory phasicity and response to augmentation. Popliteal Vein: No evidence of  thrombus. Normal compressibility, respiratory phasicity and response to augmentation. Calf Veins: No evidence of thrombus. Normal compressibility and flow on color Doppler imaging. Other Findings: Slightly prominent lymph nodes noted in the left groin region. IMPRESSION: No evidence of deep venous thrombosis. Electronically Signed   By: Kennith Center M.D.   On: 08/20/2016 13:09   Dg Knee Complete 4 Views Left  Result Date: 08/20/2016 CLINICAL DATA:  Pitting edema in bilateral lower extremities. Increased swelling. Fall 4 days ago with anterior knee contusion and tenderness at the joint line. EXAM: LEFT KNEE - COMPLETE 4+  VIEW COMPARISON:  None. FINDINGS: The left knee is located without a fracture. Enthesopathic changes along the superior aspect of the patella. No significant joint effusion. Alignment of the knee is normal. No significant joint space narrowing. Mild spurring and minimal degenerative changes in the patellofemoral compartment of the knee. IMPRESSION: No acute bone abnormality to the left knee. Electronically Signed   By: Richarda Overlie M.D.   On: 08/20/2016 13:24    ____________________________________________   PROCEDURES Procedures  ____________________________________________   INITIAL IMPRESSION / ASSESSMENT AND PLAN / ED COURSE  Pertinent labs & imaging results that were available during my care of the patient were reviewed by me and considered in my medical decision making (see chart for details).  Patient presents with left leg redness and swelling in the setting of recent minor trauma 4 days ago as well as in the setting of recent left hip surgery. Elevated risk for DVT. We'll get ultrasound of the leg as well as x-ray of the left knee in addition to labs. If he does not have a large proximal DVT that requires urgent vascular referral, I plan to start the patient on anticoagulation while awaiting follow-up outpatient. No evidence of pulmonary embolism. No signs or symptoms of  ACS dissection congestive heart failure pericarditis or tamponade.    ----------------------------------------- 2:57 PM on 08/20/2016 -----------------------------------------  X-ray of the knee and ultrasound of the left leg are negative. However, signs and symptoms are strongly consistent with DVT, so we'll start on apixaban while having follow-up with vascular surgery. No evidence of PE or other acute issues. Return precautions given.       ____________________________________________   FINAL CLINICAL IMPRESSION(S) / ED DIAGNOSES  Final diagnoses:  Peripheral edema  Leg edema  Acute deep vein thrombosis (DVT) of femoral vein of left lower extremity (HCC)      New Prescriptions   APIXABAN (ELIQUIS) 5 MG TABS TABLET    10mg  (2 tablets) by mouth twice a day for 7 days, then 5mg  (1 tablet) by mouth twice a day thereafter.     Portions of this note were generated with dragon dictation software. Dictation errors may occur despite best attempts at proofreading.    Sharman Cheek, MD 08/20/16 [id number]

## 2016-08-20 NOTE — ED Triage Notes (Addendum)
Pt c/o increased swelling, noted pitting edema in BL LE since yesterday without a hx of CHF.Marland Kitchen. Denies SOB.. Pt is here with family who brought him here from [facility].

## 2016-08-20 NOTE — Discharge Instructions (Signed)
Your ultrasound did not show any DVT, but your symptoms are strongly suspicious for a blood clot in your leg.  Start taking apixaban while following up with vascular surgery for further evaluation of your symptoms.  Your other blood tests and knee xray do not show any acute issues.

## 2016-09-17 DIAGNOSIS — I82402 Acute embolism and thrombosis of unspecified deep veins of left lower extremity: Secondary | ICD-10-CM | POA: Diagnosis not present

## 2016-09-17 DIAGNOSIS — Z79899 Other long term (current) drug therapy: Secondary | ICD-10-CM | POA: Diagnosis not present

## 2016-09-17 DIAGNOSIS — J4530 Mild persistent asthma, uncomplicated: Secondary | ICD-10-CM | POA: Diagnosis not present

## 2016-09-17 DIAGNOSIS — I10 Essential (primary) hypertension: Secondary | ICD-10-CM | POA: Diagnosis not present

## 2016-10-05 DIAGNOSIS — J302 Other seasonal allergic rhinitis: Secondary | ICD-10-CM | POA: Diagnosis not present

## 2016-10-05 DIAGNOSIS — R5383 Other fatigue: Secondary | ICD-10-CM | POA: Diagnosis not present

## 2016-10-05 DIAGNOSIS — K1379 Other lesions of oral mucosa: Secondary | ICD-10-CM | POA: Diagnosis not present

## 2016-10-29 DIAGNOSIS — R05 Cough: Secondary | ICD-10-CM | POA: Diagnosis not present

## 2016-11-26 DIAGNOSIS — R350 Frequency of micturition: Secondary | ICD-10-CM | POA: Diagnosis not present

## 2016-11-26 DIAGNOSIS — R300 Dysuria: Secondary | ICD-10-CM | POA: Diagnosis not present

## 2016-11-26 DIAGNOSIS — F411 Generalized anxiety disorder: Secondary | ICD-10-CM | POA: Diagnosis not present

## 2016-11-26 DIAGNOSIS — R42 Dizziness and giddiness: Secondary | ICD-10-CM | POA: Diagnosis not present

## 2016-12-05 DIAGNOSIS — R262 Difficulty in walking, not elsewhere classified: Secondary | ICD-10-CM | POA: Diagnosis not present

## 2016-12-05 DIAGNOSIS — R600 Localized edema: Secondary | ICD-10-CM | POA: Diagnosis not present

## 2016-12-05 DIAGNOSIS — R300 Dysuria: Secondary | ICD-10-CM | POA: Diagnosis not present

## 2016-12-11 ENCOUNTER — Telehealth: Payer: Self-pay | Admitting: Internal Medicine

## 2016-12-11 DIAGNOSIS — F39 Unspecified mood [affective] disorder: Secondary | ICD-10-CM | POA: Diagnosis not present

## 2016-12-11 DIAGNOSIS — J45901 Unspecified asthma with (acute) exacerbation: Secondary | ICD-10-CM | POA: Diagnosis not present

## 2016-12-11 DIAGNOSIS — M6281 Muscle weakness (generalized): Secondary | ICD-10-CM | POA: Diagnosis not present

## 2016-12-11 DIAGNOSIS — R531 Weakness: Secondary | ICD-10-CM | POA: Diagnosis not present

## 2016-12-11 DIAGNOSIS — K219 Gastro-esophageal reflux disease without esophagitis: Secondary | ICD-10-CM | POA: Diagnosis not present

## 2016-12-11 DIAGNOSIS — R2681 Unsteadiness on feet: Secondary | ICD-10-CM | POA: Diagnosis not present

## 2016-12-11 DIAGNOSIS — R262 Difficulty in walking, not elsewhere classified: Secondary | ICD-10-CM | POA: Diagnosis not present

## 2016-12-11 DIAGNOSIS — R600 Localized edema: Secondary | ICD-10-CM | POA: Diagnosis not present

## 2016-12-11 DIAGNOSIS — I10 Essential (primary) hypertension: Secondary | ICD-10-CM | POA: Diagnosis not present

## 2016-12-11 DIAGNOSIS — J45909 Unspecified asthma, uncomplicated: Secondary | ICD-10-CM | POA: Diagnosis not present

## 2016-12-11 DIAGNOSIS — R300 Dysuria: Secondary | ICD-10-CM | POA: Diagnosis not present

## 2016-12-11 NOTE — Telephone Encounter (Signed)
I will plan to see him today at [facility]

## 2016-12-11 NOTE — Telephone Encounter (Signed)
Patient's daughter,Susan,returned Dr.Letvak's call.  Darl PikesSusan said she approves of Dr.Letvak being her father's PCP.

## 2016-12-24 ENCOUNTER — Encounter: Payer: Self-pay | Admitting: Internal Medicine

## 2016-12-24 DIAGNOSIS — I10 Essential (primary) hypertension: Secondary | ICD-10-CM | POA: Diagnosis not present

## 2016-12-24 DIAGNOSIS — K219 Gastro-esophageal reflux disease without esophagitis: Secondary | ICD-10-CM | POA: Diagnosis not present

## 2016-12-24 DIAGNOSIS — J45909 Unspecified asthma, uncomplicated: Secondary | ICD-10-CM | POA: Diagnosis not present

## 2016-12-24 DIAGNOSIS — M6281 Muscle weakness (generalized): Secondary | ICD-10-CM | POA: Diagnosis not present

## 2016-12-24 DIAGNOSIS — F39 Unspecified mood [affective] disorder: Secondary | ICD-10-CM | POA: Insufficient documentation

## 2016-12-25 ENCOUNTER — Telehealth: Payer: Self-pay

## 2016-12-25 NOTE — Telephone Encounter (Signed)
Medication reconciliation done at Dr Karle StarchLetvak's request per med list from [facility]

## 2017-01-03 ENCOUNTER — Encounter: Payer: Self-pay | Admitting: Internal Medicine

## 2017-01-03 ENCOUNTER — Non-Acute Institutional Stay: Payer: Medicare Other | Admitting: Internal Medicine

## 2017-01-03 VITALS — BP 135/55 | HR 84 | Temp 98.3°F | Resp 18 | Wt 148.0 lb

## 2017-01-03 DIAGNOSIS — G20 Parkinson's disease: Secondary | ICD-10-CM | POA: Diagnosis not present

## 2017-01-03 DIAGNOSIS — N401 Enlarged prostate with lower urinary tract symptoms: Secondary | ICD-10-CM | POA: Diagnosis not present

## 2017-01-03 DIAGNOSIS — I10 Essential (primary) hypertension: Secondary | ICD-10-CM | POA: Insufficient documentation

## 2017-01-03 DIAGNOSIS — N138 Other obstructive and reflux uropathy: Secondary | ICD-10-CM | POA: Diagnosis not present

## 2017-01-03 DIAGNOSIS — K219 Gastro-esophageal reflux disease without esophagitis: Secondary | ICD-10-CM

## 2017-01-03 DIAGNOSIS — G20B2 Parkinson's disease with dyskinesia, with fluctuations: Secondary | ICD-10-CM | POA: Insufficient documentation

## 2017-01-03 DIAGNOSIS — J45909 Unspecified asthma, uncomplicated: Secondary | ICD-10-CM | POA: Insufficient documentation

## 2017-01-03 DIAGNOSIS — F39 Unspecified mood [affective] disorder: Secondary | ICD-10-CM | POA: Diagnosis not present

## 2017-01-03 DIAGNOSIS — J4520 Mild intermittent asthma, uncomplicated: Secondary | ICD-10-CM

## 2017-01-03 DIAGNOSIS — J45901 Unspecified asthma with (acute) exacerbation: Secondary | ICD-10-CM | POA: Insufficient documentation

## 2017-01-03 MED ORDER — CARBIDOPA-LEVODOPA 25-100 MG PO TABS: 1.0000 | ORAL_TABLET | Freq: Two times a day (BID) | ORAL | 11 refills | Status: DC

## 2017-01-03 NOTE — Assessment & Plan Note (Signed)
Having nocturia but no major daytime symptoms Will hold off on flomax--because I am starting sinemet and don't want to do too much

## 2017-01-03 NOTE — Assessment & Plan Note (Signed)
He clearly looks Parkinsonian now I will try empiric Rx with sinemet and have Rene KocherRegina check him in a month

## 2017-01-03 NOTE — Assessment & Plan Note (Signed)
Off the zantac in case it was affecting mental status Quiet on omeprazole

## 2017-01-03 NOTE — Progress Notes (Signed)
Subjective:    Patient ID: Brandon Calhoun, male    DOB: 11-14-1929, 83 y.o.   MRN: [id number]  HPI Establishing care with me after I have seen him in the rehab facility Reviewed status with Magda Paganini RN  Medication changes had been made which seemed to help his weakness Able to get back to his  Walking with walker and getting down for meals Has aide bid to help with bathing and dressing Independent with bathroom still --but with difficulty  Having frequent nocturia This is causing him distress Urinary symptoms go back for a while Flow is okay  Occasional dribbling/incontinence  Anxiety still works up at times The nocturia has been causing him some distress Not depressed  No wheezing or cough Feels asthma is controlled  No heartburn or dysphagia  Current Outpatient Prescriptions on File Prior to Visit  Medication Sig Dispense Refill  . budesonide-formoterol (SYMBICORT) 160-4.5 MCG/ACT inhaler Inhale 2 puffs into the lungs 2 (two) times daily.    . citalopram (CELEXA) 10 MG tablet Take 1 tablet by mouth daily.    . fluticasone (FLONASE) 50 MCG/ACT nasal spray Place 2 sprays into the nose daily.    . montelukast (SINGULAIR) 10 MG tablet Take 1 tablet by mouth daily.    Marland Kitchen omeprazole (PRILOSEC) 20 MG capsule Take 1 capsule by mouth daily.     No current facility-administered medications on file prior to visit.     No Known Allergies  Past Medical History:  Diagnosis Date  . Asthma   . Edema    MILD OF FEET  . GERD (gastroesophageal reflux disease)   . Hypertension   . Mood disorder (HCC)    chronic anxiety    Past Surgical History:  Procedure Laterality Date  . CATARACT EXTRACTION W/PHACO Left 02/16/2016   Procedure: CATARACT EXTRACTION PHACO AND INTRAOCULAR LENS PLACEMENT (IOC);  Surgeon: Nevada Crane, MD;  Location: [facility] ORS;  Service: Ophthalmology;  Laterality: Left;  [place] 1.10 AP% 10.6 CDE 7.41 Fluid Pack Lot # [id number] H  . CATARACT EXTRACTION W/PHACO  Right 03/08/2016   Procedure: CATARACT EXTRACTION PHACO AND INTRAOCULAR LENS PLACEMENT (IOC);  Surgeon: Nevada Crane, MD;  Location: [facility] OR[facility];  Service: Ophthalmology;  Laterality: Right;  [place] 1.14 AP% 11.6 CDE 11.80 Fluid Pack Lot # [id number]  . HERNIA REPAIR    . HIP ARTHROPLASTY Left 07/06/2016   Procedure: ARTHROPLASTY BIPOLAR HIP (HEMIARTHROPLASTY);  Surgeon: Kennedy Bucker, MD;  Location: [facility] ORS;  Service: Orthopedics;  Laterality: Left;    Family History  Problem Relation Age of Onset  . COPD Brother   . Hypertension Neg Hx   . Diabetes Neg Hx   . Heart disease Neg Hx     Social History   Social History  . Marital status: Widowed    Spouse name: N/A  . Number of children: N/A  . Years of education: N/A   Occupational History  . 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 Smoking status: Never Smoker  . Smokeless tobacco: Never Used  . Alcohol use No  . Drug use: No  . Sexual activity: Not on file   Other Topics Concern  . Not on file   Social History Narrative   Widowed 2003. 1 daughter      Has living will   Daughter Darl Pikes is health care POA   Would accept resuscitation attempts   No tube feeds if cognitively unaware   Review of Systems  Appetite is good Weight fairly stable Sleeps okay other than the nocturia No pain issues Bowels are okay--2-3 times per week (stable) Breathing is fine Edema is gone (HCTZ was stopped). Just wears the hose Mild tremor in both hands    Objective:   Physical Exam  Constitutional: He appears well-nourished. No distress.  Neck: No thyromegaly present.  Cardiovascular: Normal rate, regular rhythm and normal heart sounds.  Exam reveals no gallop.   No murmur heard. Pulmonary/Chest: Effort normal and breath sounds normal. No respiratory distress. He has no wheezes. He has no rales.  Abdominal: Soft. There is no tenderness.  Musculoskeletal: He exhibits no edema.    Lymphadenopathy:    He has no cervical adenopathy.  Neurological:  Mild hand tremor Bradykinesia Slow gait  Psychiatric: He has a normal mood and affect. His behavior is normal.          Assessment & Plan:

## 2017-01-03 NOTE — Assessment & Plan Note (Signed)
Quiet on the symbicort and singulair

## 2017-01-03 NOTE — Assessment & Plan Note (Signed)
Ongoing anxiety May need to consider increasing his citalopram

## 2017-01-03 NOTE — Assessment & Plan Note (Signed)
BP Readings from Last 3 Encounters:  01/03/17 (!) 135/55  08/20/16 140/60  07/09/16 (!) 127/55   Doing okay now Off the HCTZ and no edema

## 2017-02-27 ENCOUNTER — Ambulatory Visit: Payer: Medicare Other | Admitting: Internal Medicine

## 2017-02-27 ENCOUNTER — Encounter: Payer: Self-pay | Admitting: Internal Medicine

## 2017-02-27 VITALS — BP 144/62 | HR 68

## 2017-02-27 DIAGNOSIS — B351 Tinea unguium: Secondary | ICD-10-CM

## 2017-02-27 NOTE — Patient Instructions (Signed)

## 2017-02-27 NOTE — Progress Notes (Signed)
Subjective:    Patient ID: Brandon Calhoun, male    DOB: 12-Oct-1929, 83 y.o.   MRN: [id number]  HPI  Asked to see pt in ALF 209 Asking to have is toenails trimmed. They are thick, long getting caught on sock causing pain.   Review of Systems  Past Medical History:  Diagnosis Date  . Asthma   . Edema    MILD OF FEET  . GERD (gastroesophageal reflux disease)   . Hypertension   . Mood disorder (HCC)    chronic anxiety    Current Outpatient Prescriptions  Medication Sig Dispense Refill  . budesonide-formoterol (SYMBICORT) 160-4.5 MCG/ACT inhaler Inhale 2 puffs into the lungs 2 (two) times daily.    . carbidopa-levodopa (SINEMET IR) 25-100 MG tablet Take 1 tablet by mouth 2 (two) times daily. 60 tablet 11  . citalopram (CELEXA) 10 MG tablet Take 1 tablet by mouth daily.    . fluticasone (FLONASE) 50 MCG/ACT nasal spray Place 2 sprays into the nose daily.    . montelukast (SINGULAIR) 10 MG tablet Take 1 tablet by mouth daily.    Marland Kitchen omeprazole (PRILOSEC) 20 MG capsule Take 1 capsule by mouth daily.     No current facility-administered medications for this visit.     No Known Allergies  Family History  Problem Relation Age of Onset  . COPD Brother   . Hypertension Neg Hx   . Diabetes Neg Hx   . Heart disease Neg Hx     Social History   Social History  . Marital status: Widowed    Spouse name: N/A  . Number of children: N/A  . Years of education: N/A   Occupational History  . 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 Smoking status: Never Smoker  . Smokeless tobacco: Never Used  . Alcohol use No  . Drug use: No  . Sexual activity: Not on file   Other Topics Concern  . Not on file   Social History Narrative   Widowed 2003. 1 daughter      Has living will   Daughter Darl Pikes is health care POA   Would accept resuscitation attempts   No tube feeds if cognitively unaware     Constitutional: Denies fever, malaise,  fatigue, headache or abrupt weight changes.  Skin: Pt reports mycotic toenails. Denies redness, rashes, lesions or ulcercations.    No other specific complaints in a complete review of systems (except as listed in HPI above).     Objective:   Physical Exam   BP (!) 144/62   Pulse 68  Wt Readings from Last 3 Encounters:  01/03/17 148 lb (67.1 kg)  08/20/16 150 lb (68 kg)  07/06/16 148 lb (67.1 kg)    General: Appears his stated age, in NAD. Skin: Mycotic toenails noted.  BMET    Component Value Date/Time   NA 137 08/20/2016 1145   K 3.7 08/20/2016 1145   CL 106 08/20/2016 1145   CO2 24 08/20/2016 1145   GLUCOSE 113 (H) 08/20/2016 1145   BUN 20 08/20/2016 1145   CREATININE 0.72 08/20/2016 1145   CALCIUM 8.6 (L) 08/20/2016 1145   GFRNONAA >60 08/20/2016 1145   GFRAA >60 08/20/2016 1145    Lipid Panel  No results found for: CHOL, TRIG, HDL, CHOLHDL, VLDL, LDLCALC  CBC    Component Value Date/Time   WBC 10.9 (H) 08/20/2016 1145   RBC 3.97 (L) 08/20/2016 1145   HGB  12.4 (L) 08/20/2016 1145   HCT 35.9 (L) 08/20/2016 1145   PLT 346 08/20/2016 1145   MCV 90.4 08/20/2016 1145   MCH 31.2 08/20/2016 1145   MCHC 34.5 08/20/2016 1145   RDW 14.9 (H) 08/20/2016 1145   LYMPHSABS 1.8 08/20/2016 1145   MONOABS 0.7 08/20/2016 1145   EOSABS 0.1 08/20/2016 1145   BASOSABS 0.0 08/20/2016 1145    Hgb A1C No results found for: HGBA1C         Assessment & Plan:   Mycotic Toenails:  Trimmed by provider using dremmel tool  Will follow up as needed Nicki ReaperBAITY, Brandon Brase, NP

## 2017-04-12 ENCOUNTER — Ambulatory Visit: Payer: Medicare Other | Admitting: Internal Medicine

## 2017-04-12 DIAGNOSIS — G20 Parkinson's disease: Secondary | ICD-10-CM

## 2017-04-12 DIAGNOSIS — I10 Essential (primary) hypertension: Secondary | ICD-10-CM

## 2017-04-12 DIAGNOSIS — N138 Other obstructive and reflux uropathy: Secondary | ICD-10-CM

## 2017-04-12 DIAGNOSIS — J4540 Moderate persistent asthma, uncomplicated: Secondary | ICD-10-CM

## 2017-04-12 DIAGNOSIS — K219 Gastro-esophageal reflux disease without esophagitis: Secondary | ICD-10-CM

## 2017-04-12 DIAGNOSIS — N401 Enlarged prostate with lower urinary tract symptoms: Secondary | ICD-10-CM | POA: Diagnosis not present

## 2017-04-12 DIAGNOSIS — F39 Unspecified mood [affective] disorder: Secondary | ICD-10-CM

## 2017-04-17 ENCOUNTER — Encounter: Payer: Self-pay | Admitting: Internal Medicine

## 2017-04-17 NOTE — Assessment & Plan Note (Signed)
Controlled off meds  Will monitor 

## 2017-04-17 NOTE — Progress Notes (Signed)
Subjective:    Patient ID: Brandon Calhoun, male    DOB: [birth year], 83 y.o.   MRN: [id number]  HPI  Routine follow up for resident in appt 209.  Reviewed with Magda PaganiniAudrey, RN. No new concerns from staff or resident. Sleeps well most of the time. Sleep sometimes interrupted by nocturia. Walks with walker. Independent with ADL's. Appetite good. He has gained a little weight. His bowels are okay. He denies pain, reflux or SOB>  Asthma: Moderate persistent. Controlled with Singulair, Flonase and Symbicort. He denies shortness of breath.  Parkinson's Disease: He reports his tremor has markedly improved since starting the Sinemet.  BPH: Intermittent nocturia. Currently not on any medications at this time.  GERD: No issues on Prilosec.  HTN: Not on any medications at this time.  Mood Disorder: Stable on Celexa.  Review of Systems      Past Medical History:  Diagnosis Date  . Asthma   . Edema    MILD OF FEET  . GERD (gastroesophageal reflux disease)   . Hypertension   . Mood disorder (HCC)    chronic anxiety    Current Outpatient Prescriptions  Medication Sig Dispense Refill  . budesonide-formoterol (SYMBICORT) 160-4.5 MCG/ACT inhaler Inhale 2 puffs into the lungs 2 (two) times daily.    . carbidopa-levodopa (SINEMET IR) 25-100 MG tablet Take 1 tablet by mouth 2 (two) times daily. 60 tablet 11  . citalopram (CELEXA) 10 MG tablet Take 1 tablet by mouth daily.    . fluticasone (FLONASE) 50 MCG/ACT nasal spray Place 2 sprays into the nose daily.    . montelukast (SINGULAIR) 10 MG tablet Take 1 tablet by mouth daily.    Marland Kitchen. omeprazole (PRILOSEC) 20 MG capsule Take 1 capsule by mouth daily.     No current facility-administered medications for this visit.     No Known Allergies  Family History  Problem Relation Age of Onset  . COPD Brother   . Hypertension Neg Hx   . Diabetes Neg Hx   . Heart disease Neg Hx     Social History   Social History  . Marital status: Widowed      Spouse name: N/A  . Number of children: N/A  . Years of education: N/A   Occupational History  . 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 Smoking status: Never Smoker  . Smokeless tobacco: Never Used  . Alcohol use No  . Drug use: No  . Sexual activity: Not on file   Other Topics Concern  . Not on file   Social History Narrative   Widowed 2003. 1 daughter      Has living will   Daughter Darl PikesSusan is health care POA   Would accept resuscitation attempts   No tube feeds if cognitively unaware     Constitutional: Denies fever, malaise, fatigue, headache or abrupt weight changes.  Respiratory: Denies difficulty breathing, shortness of breath, cough or sputum production.   Cardiovascular: Denies chest pain, chest tightness, palpitations or swelling in the hands or feet.  Gastrointestinal: Denies abdominal pain, bloating, constipation, diarrhea or blood in the stool.  GU: Pt reports nocturia. Denies urgency, frequency, pain with urination, burning sensation, blood in urine, odor or discharge. Musculoskeletal: Denies decrease in range of motion, difficulty with gait, muscle pain or joint pain and swelling.  Skin: Denies redness, rashes, lesions or ulcercations.  Neurological: Denies dizziness, difficulty with memory, difficulty with speech or problems with balance and coordination.  Psych: Denies anxiety, depression, SI/HI.  No other specific complaints in a complete review of systems (except as listed in HPI above).  Objective:   Physical Exam   BP 118/70   Pulse 68   Temp 97.6 F (36.4 C)   Resp 16   Wt 159 lb (72.1 kg)   BMI 23.48 kg/m  Wt Readings from Last 3 Encounters:  04/17/17 159 lb (72.1 kg)  01/03/17 148 lb (67.1 kg)  08/20/16 150 lb (68 kg)    General: Appears his stated age, in NAD. Neck:  Neck supple, trachea midline. No masses, lumps or thyromegaly present.  Cardiovascular: Normal rate and rhythm. S1,S2 noted.   No murmur, rubs or gallops noted.  Pulmonary/Chest: Normal effort and positive vesicular breath sounds. No respiratory distress. No wheezes, rales or ronchi noted.  Abdomen: Soft and nontender. Normal bowel sounds.  Neurological: Alert and oriented. No tremor noted. Psychiatric: Mood and affect normal. Behavior is normal. Judgment and thought content normal.     BMET    Component Value Date/Time   NA 137 08/20/2016 1145   K 3.7 08/20/2016 1145   CL 106 08/20/2016 1145   CO2 24 08/20/2016 1145   GLUCOSE 113 (H) 08/20/2016 1145   BUN 20 08/20/2016 1145   CREATININE 0.72 08/20/2016 1145   CALCIUM 8.6 (L) 08/20/2016 1145   GFRNONAA >60 08/20/2016 1145   GFRAA >60 08/20/2016 1145    Lipid Panel  No results found for: CHOL, TRIG, HDL, CHOLHDL, VLDL, LDLCALC  CBC    Component Value Date/Time   WBC 10.9 (H) 08/20/2016 1145   RBC 3.97 (L) 08/20/2016 1145   HGB 12.4 (L) 08/20/2016 1145   HCT 35.9 (L) 08/20/2016 1145   PLT 346 08/20/2016 1145   MCV 90.4 08/20/2016 1145   MCH 31.2 08/20/2016 1145   MCHC 34.5 08/20/2016 1145   RDW 14.9 (H) 08/20/2016 1145   LYMPHSABS 1.8 08/20/2016 1145   MONOABS 0.7 08/20/2016 1145   EOSABS 0.1 08/20/2016 1145   BASOSABS 0.0 08/20/2016 1145    Hgb A1C No results found for: HGBA1C         Assessment & Plan:

## 2017-04-17 NOTE — Assessment & Plan Note (Signed)
Controlled on Symbicort, Singulair and Flonase Will monitor

## 2017-04-17 NOTE — Assessment & Plan Note (Signed)
Continue Prilosec for now

## 2017-04-17 NOTE — Assessment & Plan Note (Signed)
C/o nocturia Not severe enough to desire medication therapy at this time Will monitor

## 2017-04-17 NOTE — Assessment & Plan Note (Signed)
Improved since starting Sinement Needs ALF

## 2017-04-17 NOTE — Assessment & Plan Note (Signed)
Improved. D/C Ativan Change Celexa to every other day.

## 2017-04-25 ENCOUNTER — Encounter: Payer: Self-pay | Admitting: Internal Medicine

## 2017-04-25 DIAGNOSIS — D219 Benign neoplasm of connective and other soft tissue, unspecified: Secondary | ICD-10-CM | POA: Diagnosis not present

## 2017-04-25 DIAGNOSIS — I10 Essential (primary) hypertension: Secondary | ICD-10-CM | POA: Diagnosis not present

## 2017-07-18 ENCOUNTER — Encounter: Payer: Self-pay | Admitting: Internal Medicine

## 2017-07-18 ENCOUNTER — Ambulatory Visit: Payer: Medicare Other | Admitting: Internal Medicine

## 2017-07-18 VITALS — BP 148/64 | HR 68 | Temp 97.9°F | Resp 20 | Wt 161.0 lb

## 2017-07-18 DIAGNOSIS — N401 Enlarged prostate with lower urinary tract symptoms: Secondary | ICD-10-CM | POA: Diagnosis not present

## 2017-07-18 DIAGNOSIS — I10 Essential (primary) hypertension: Secondary | ICD-10-CM | POA: Diagnosis not present

## 2017-07-18 DIAGNOSIS — G20 Parkinson's disease: Secondary | ICD-10-CM

## 2017-07-18 DIAGNOSIS — K219 Gastro-esophageal reflux disease without esophagitis: Secondary | ICD-10-CM | POA: Diagnosis not present

## 2017-07-18 DIAGNOSIS — J4540 Moderate persistent asthma, uncomplicated: Secondary | ICD-10-CM

## 2017-07-18 DIAGNOSIS — G20A1 Parkinson's disease without dyskinesia, without mention of fluctuations: Secondary | ICD-10-CM

## 2017-07-18 DIAGNOSIS — N138 Other obstructive and reflux uropathy: Secondary | ICD-10-CM | POA: Diagnosis not present

## 2017-07-18 MED ORDER — CARBIDOPA-LEVODOPA 25-100 MG PO TABS: 1.0000 | ORAL_TABLET | Freq: Three times a day (TID) | ORAL | 11 refills | Status: DC

## 2017-07-18 NOTE — Assessment & Plan Note (Signed)
BP Readings from Last 3 Encounters:  07/18/17 (!) 148/64  04/17/17 118/70  02/27/17 (!) 144/62   Good control Wouldn't be more aggressive in view of his Parkinson's and risk for orthostatic hypotemsion

## 2017-07-18 NOTE — Assessment & Plan Note (Signed)
Mild symptoms--especially nocturia No need for more med though

## 2017-07-18 NOTE — Progress Notes (Signed)
Subjective:    Patient ID: Brandon Calhoun, male    DOB: 02/28/1930, 84 y.o.   MRN: [id number]  HPI Visit in [facility] assisted living for review of chronic health conditions Reviewed status with Magda PaganiniAudrey RN  Has noted significant improvement in tremor with the sinemet Does feel it runs out a bit Able to eat without spilling food  Was getting mucus after eating--some cough Swallows okay Discussed this could be related to the Parkinson's   No heartburn on the PPI Feels this is adequate  Chronic anxiety Controlled on the citalopram No depression  Asthma seems quiet No wheezing or SOB  Walks with rolling at times in room---rollator outside Still has bid aides --help with dressing and showers  Current Outpatient Medications on File Prior to Visit  Medication Sig Dispense Refill  . budesonide-formoterol (SYMBICORT) 160-4.5 MCG/ACT inhaler Inhale 2 puffs into the lungs 2 (two) times daily.    . carbidopa-levodopa (SINEMET IR) 25-100 MG tablet Take 1 tablet by mouth 2 (two) times daily. 60 tablet 11  . citalopram (CELEXA) 10 MG tablet Take 1 tablet by mouth daily.    . fluticasone (FLONASE) 50 MCG/ACT nasal spray Place 2 sprays into the nose daily.    . montelukast (SINGULAIR) 10 MG tablet Take 1 tablet by mouth daily.    Marland Kitchen. omeprazole (PRILOSEC) 20 MG capsule Take 1 capsule by mouth daily.     No current facility-administered medications on file prior to visit.     No Known Allergies  Past Medical History:  Diagnosis Date  . Asthma   . Edema    MILD OF FEET  . GERD (gastroesophageal reflux disease)   . Hypertension   . Mood disorder (HCC)    chronic anxiety    Past Surgical History:  Procedure Laterality Date  . CATARACT EXTRACTION W/PHACO Left 02/16/2016   Procedure: CATARACT EXTRACTION PHACO AND INTRAOCULAR LENS PLACEMENT (IOC);  Surgeon: Nevada CraneBradley Mark King, MD;  Location: [facility] ORS;  Service: Ophthalmology;  Laterality: Left;  [place] 1.10 AP% 10.6 CDE 7.41 Fluid  Pack Lot # [id number] H  . CATARACT EXTRACTION W/PHACO Right 03/08/2016   Procedure: CATARACT EXTRACTION PHACO AND INTRAOCULAR LENS PLACEMENT (IOC);  Surgeon: Nevada CraneBradley Mark King, MD;  Location: [facility] ORS;  Service: Ophthalmology;  Laterality: Right;  [place] 1.14 AP% 11.6 CDE 11.80 Fluid Pack Lot # [id number] H  . HERNIA REPAIR    . HIP ARTHROPLASTY Left 07/06/2016   Procedure: ARTHROPLASTY BIPOLAR HIP (HEMIARTHROPLASTY);  Surgeon: Kennedy BuckerMichael Menz, MD;  Location: [facility] ORS;  Service: Orthopedics;  Laterality: Left;    Family History  Problem Relation Age of Onset  . COPD Brother   . Hypertension Neg Hx   . Diabetes Neg Hx   . Heart disease Neg Hx     Social History   Socioeconomic History  . Marital status: Widowed    Spouse name: Not on file  . Number of children: Not on file  . Years of education: Not on file  . Highest education level: Not on file  Social Needs  . Financial resource strain: Not on file  . Food insecurity - worry: Not on file  . Food insecurity - inability: Not on file  . Transportation needs - medical: Not on file  . Transportation needs - non-medical: Not on file  Occupational History  . 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 Smoking status: Never Smoker  . Smokeless tobacco: Never Used  Substance and Sexual Activity  .  Alcohol use: No  . Drug use: No  . Sexual activity: Not on file  Other Topics Concern  . Not on file  Social History Narrative   Widowed 2003. 1 daughter      Has living will   Daughter Darl Pikes is health care POA   Would accept resuscitation attempts   No tube feeds if cognitively unaware   Review of Systems Did have brief yellow mucus--gone now Some sleep problems--not usually. No abnormal dreams Some bowel problems---not emptying completely at times. Not ready for meds yet Appetite is okay Weight is stable now---up 10# since here though Wears protective garmets--just due to fecal staining at  times    Objective:   Physical Exam  Constitutional: He appears well-developed. No distress.  Neck: No thyromegaly present.  Cardiovascular: Normal rate, regular rhythm and normal heart sounds. Exam reveals no gallop.  No murmur heard. Pulmonary/Chest: Effort normal and breath sounds normal. No respiratory distress. He has no wheezes. He has no rales.  Abdominal: Soft. There is no tenderness.  Musculoskeletal: He exhibits no edema or tenderness.  Lymphadenopathy:    He has no cervical adenopathy.  Neurological:  Still with somewhat masked facies and bradykinesia Tremor mostly gone  Skin: No rash noted.  Psychiatric: He has a normal mood and affect. His behavior is normal.          Assessment & Plan:

## 2017-07-18 NOTE — Assessment & Plan Note (Signed)
Improved on the sinemet Will increase to tid

## 2017-07-18 NOTE — Assessment & Plan Note (Signed)
Controlled on his regimen 

## 2017-07-18 NOTE — Assessment & Plan Note (Signed)
Controlled on the PPI 

## 2017-10-11 ENCOUNTER — Ambulatory Visit: Payer: Medicare Other | Admitting: Internal Medicine

## 2017-10-11 DIAGNOSIS — I10 Essential (primary) hypertension: Secondary | ICD-10-CM | POA: Diagnosis not present

## 2017-10-11 DIAGNOSIS — G20 Parkinson's disease: Secondary | ICD-10-CM

## 2017-10-11 DIAGNOSIS — N401 Enlarged prostate with lower urinary tract symptoms: Secondary | ICD-10-CM

## 2017-10-11 DIAGNOSIS — N138 Other obstructive and reflux uropathy: Secondary | ICD-10-CM | POA: Diagnosis not present

## 2017-10-11 DIAGNOSIS — K219 Gastro-esophageal reflux disease without esophagitis: Secondary | ICD-10-CM

## 2017-10-11 DIAGNOSIS — F39 Unspecified mood [affective] disorder: Secondary | ICD-10-CM | POA: Diagnosis not present

## 2017-10-11 DIAGNOSIS — J4540 Moderate persistent asthma, uncomplicated: Secondary | ICD-10-CM

## 2017-10-21 ENCOUNTER — Encounter: Payer: Self-pay | Admitting: Internal Medicine

## 2017-10-21 NOTE — Assessment & Plan Note (Signed)
Discussed Celexa wean He is not interested in weaning medication at this time Will monitor

## 2017-10-21 NOTE — Progress Notes (Signed)
Subjective:    Patient ID: Brandon Calhoun, male    DOB: 1930-01-09, 84 y.o.   MRN: [id number]  HPI  Routine visit for resident in [address]. No concerns from RN. Resident reports frequent urination, mostly nocturia. He denies urgency, dysuria or blood in his urine. He also reports mild right leg intermittent swelling. He denies pain, redness or warmth. He reports elevation dose help. Otherwise, he is sleeping well. He walks with rollater. He denies recent falls. His weight is stable. He denies issues with bowels. He denies chest pain, reflux or SOB:  Allergy Induced Asthma: Controlled on Singulair, Symbicort and Flonase.  GERD: He denies breakthrough on his current dose of Omeprazole.  HTN: Controlled off meds.  Mood Disorder: Mainly dysthymia. He is taking Celexa as prescribed. He denies anxiety, SI/HI.   Parkinson's Disease: He does have some bradykinesia but not a lot of stiffness. He is taking Sinemet as prescribed.   Review of Systems  Past Medical History:  Diagnosis Date  . Asthma   . Edema    MILD OF FEET  . GERD (gastroesophageal reflux disease)   . Hypertension   . Mood disorder (HCC)    chronic anxiety    Current Outpatient Medications  Medication Sig Dispense Refill  . budesonide-formoterol (SYMBICORT) 160-4.5 MCG/ACT inhaler Inhale 2 puffs into the lungs 2 (two) times daily.    . carbidopa-levodopa (SINEMET IR) 25-100 MG tablet Take 1 tablet by mouth 3 (three) times daily. 90 tablet 11  . citalopram (CELEXA) 10 MG tablet Take 1 tablet by mouth daily.    . fluticasone (FLONASE) 50 MCG/ACT nasal spray Place 2 sprays into the nose daily.    . montelukast (SINGULAIR) 10 MG tablet Take 10 mg by mouth at bedtime.    Marland Kitchen omeprazole (PRILOSEC) 20 MG capsule Take 1 capsule by mouth daily.    . montelukast (SINGULAIR) 10 MG tablet Take 1 tablet by mouth daily.     No current facility-administered medications for this visit.     No Known Allergies  Family History    Problem Relation Age of Onset  . COPD Brother   . Hypertension Neg Hx   . Diabetes Neg Hx   . Heart disease Neg Hx     Social History   Socioeconomic History  . Marital status: Widowed    Spouse name: Not on file  . Number of children: Not on file  . Years of education: Not on file  . Highest education level: Not on file  Occupational History  . Occupation: Therapist, nutritional    Comment: Retired  Engineer, production  . Financial resource strain: Not on file  . Food insecurity:    Worry: Not on file    Inability: Not on file  . Transportation needs:    Medical: Not on file    Non-medical: Not on file  Tobacco Use  . Smoking status: Never Smoker  . Smokeless tobacco: Never Used  Substance and Sexual Activity  . Alcohol use: No  . Drug use: No  . Sexual activity: Not on file  Lifestyle  . Physical activity:    Days per week: Not on file    Minutes per session: Not on file  . Stress: Not on file  Relationships  . Social connections:    Talks on phone: Not on file    Gets together: Not on file    Attends religious service: Not on file    Active member of club or organization:  Not on file    Attends meetings of clubs or organizations: Not on file    Relationship status: Not on file  . Intimate partner violence:    Fear of current or ex partner: Not on file    Emotionally abused: Not on file    Physically abused: Not on file    Forced sexual activity: Not on file  Other Topics Concern  . Not on file  Social History Narrative   Widowed 2003. 1 daughter      Has living will   Daughter Darl Pikes is health care POA   Would accept resuscitation attempts   No tube feeds if cognitively unaware     Constitutional: Denies fever, malaise, fatigue, headache or abrupt weight changes.  HEENT: Denies eye pain, eye redness, ear pain, ringing in the ears, wax buildup, runny nose, nasal congestion, bloody nose, or sore throat. Respiratory: Denies difficulty  breathing, shortness of breath, cough or sputum production.   Cardiovascular: Pt reports intermittent RLE swelling. Denies chest pain, chest tightness, palpitations or swelling in the hands.  Gastrointestinal: Denies abdominal pain, bloating, constipation, diarrhea or blood in the stool.  GU: Pt reports urinary frequency. Denies urgency, pain with urination, burning sensation, blood in urine, odor or discharge. Musculoskeletal: Denies decrease in range of motion, difficulty with gait, muscle pain or joint pain and swelling.  Skin: Denies redness, rashes, lesions or ulcercations.  Neurological: Denies dizziness, difficulty with memory, difficulty with speech or problems with balance and coordination.  Psych: Denies anxiety, depression, SI/HI.  No other specific complaints in a complete review of systems (except as listed in HPI above).     Objective:   Physical Exam    BP 134/71   Pulse 66   Temp 98.2 F (36.8 C)   Resp 20   Wt 160 lb 12.8 oz (72.9 kg)   BMI 23.75 kg/m  Wt Readings from Last 3 Encounters:  10/21/17 160 lb 12.8 oz (72.9 kg)  07/18/17 161 lb (73 kg)  04/17/17 159 lb (72.1 kg)    General: Appears his stated age, well developed, well nourished in NAD. Skin: Warm, dry and intact. No ulcerations noted. Neck:  Neck supple, trachea midline. No masses, lumps or thyromegaly present.  Cardiovascular: Normal rate and rhythm. S1,S2 noted.  No murmur, rubs or gallops noted. No BLE edema noted today. Pulmonary/Chest: Normal effort and positive vesicular breath sounds. No respiratory distress. No wheezes, rales or ronchi noted.  Abdomen: Soft and nontender. Normal bowel sounds.  Neurological: Alert and oriented.  Psychiatric: Mood and affect normal. Behavior is normal. Judgment and thought content normal.     BMET    Component Value Date/Time   NA 137 08/20/2016 1145   K 3.7 08/20/2016 1145   CL 106 08/20/2016 1145   CO2 24 08/20/2016 1145   GLUCOSE 113 (H) 08/20/2016  1145   BUN 20 08/20/2016 1145   CREATININE 0.72 08/20/2016 1145   CALCIUM 8.6 (L) 08/20/2016 1145   GFRNONAA >60 08/20/2016 1145   GFRAA >60 08/20/2016 1145    Lipid Panel  No results found for: CHOL, TRIG, HDL, CHOLHDL, VLDL, LDLCALC  CBC    Component Value Date/Time   WBC 10.9 (H) 08/20/2016 1145   RBC 3.97 (L) 08/20/2016 1145   HGB 12.4 (L) 08/20/2016 1145   HCT 35.9 (L) 08/20/2016 1145   PLT 346 08/20/2016 1145   MCV 90.4 08/20/2016 1145   MCH 31.2 08/20/2016 1145   MCHC 34.5 08/20/2016 1145   RDW  14.9 (H) 08/20/2016 1145   LYMPHSABS 1.8 08/20/2016 1145   MONOABS 0.7 08/20/2016 1145   EOSABS 0.1 08/20/2016 1145   BASOSABS 0.0 08/20/2016 1145    Hgb A1C No results found for: HGBA1C        Assessment & Plan:

## 2017-10-21 NOTE — Assessment & Plan Note (Signed)
Ongoing No worse, no incontinence He wants to hold off on meds for now

## 2017-10-21 NOTE — Assessment & Plan Note (Signed)
Stable on Singulair, Flonase and Symbicort Monitor

## 2017-10-21 NOTE — Assessment & Plan Note (Signed)
He denies issues on Omeprazole Will continue for now

## 2017-10-21 NOTE — Assessment & Plan Note (Signed)
Controlled off meds Monitor 

## 2017-10-21 NOTE — Assessment & Plan Note (Signed)
Stable functional status Continue Sinemet

## 2017-10-28 ENCOUNTER — Encounter: Payer: Self-pay | Admitting: Internal Medicine

## 2017-10-28 DIAGNOSIS — K219 Gastro-esophageal reflux disease without esophagitis: Secondary | ICD-10-CM | POA: Diagnosis not present

## 2017-10-28 DIAGNOSIS — G20 Parkinson's disease: Secondary | ICD-10-CM | POA: Diagnosis not present

## 2017-10-28 DIAGNOSIS — I10 Essential (primary) hypertension: Secondary | ICD-10-CM | POA: Diagnosis not present

## 2017-10-31 DIAGNOSIS — I10 Essential (primary) hypertension: Secondary | ICD-10-CM | POA: Diagnosis not present

## 2017-10-31 DIAGNOSIS — G20 Parkinson's disease: Secondary | ICD-10-CM | POA: Diagnosis not present

## 2017-10-31 DIAGNOSIS — K219 Gastro-esophageal reflux disease without esophagitis: Secondary | ICD-10-CM | POA: Diagnosis not present

## 2017-12-13 IMAGING — CR DG HIP (WITH OR WITHOUT PELVIS) 2-3V*L*
1 series · 3 of 3 positions shown · non-contrast
Comparison: None.

CLINICAL DATA: Fall with left hip pain. Leg shortening and
rotation.

EXAM:
DG HIP (WITH OR WITHOUT PELVIS) 2-3V LEFT

[Series 1: dg hip unilat w or w/o pelvis 2-3 views  · non-contrast · 0.14mm/px · 3 of 3 slices shown]
[im 1/3]
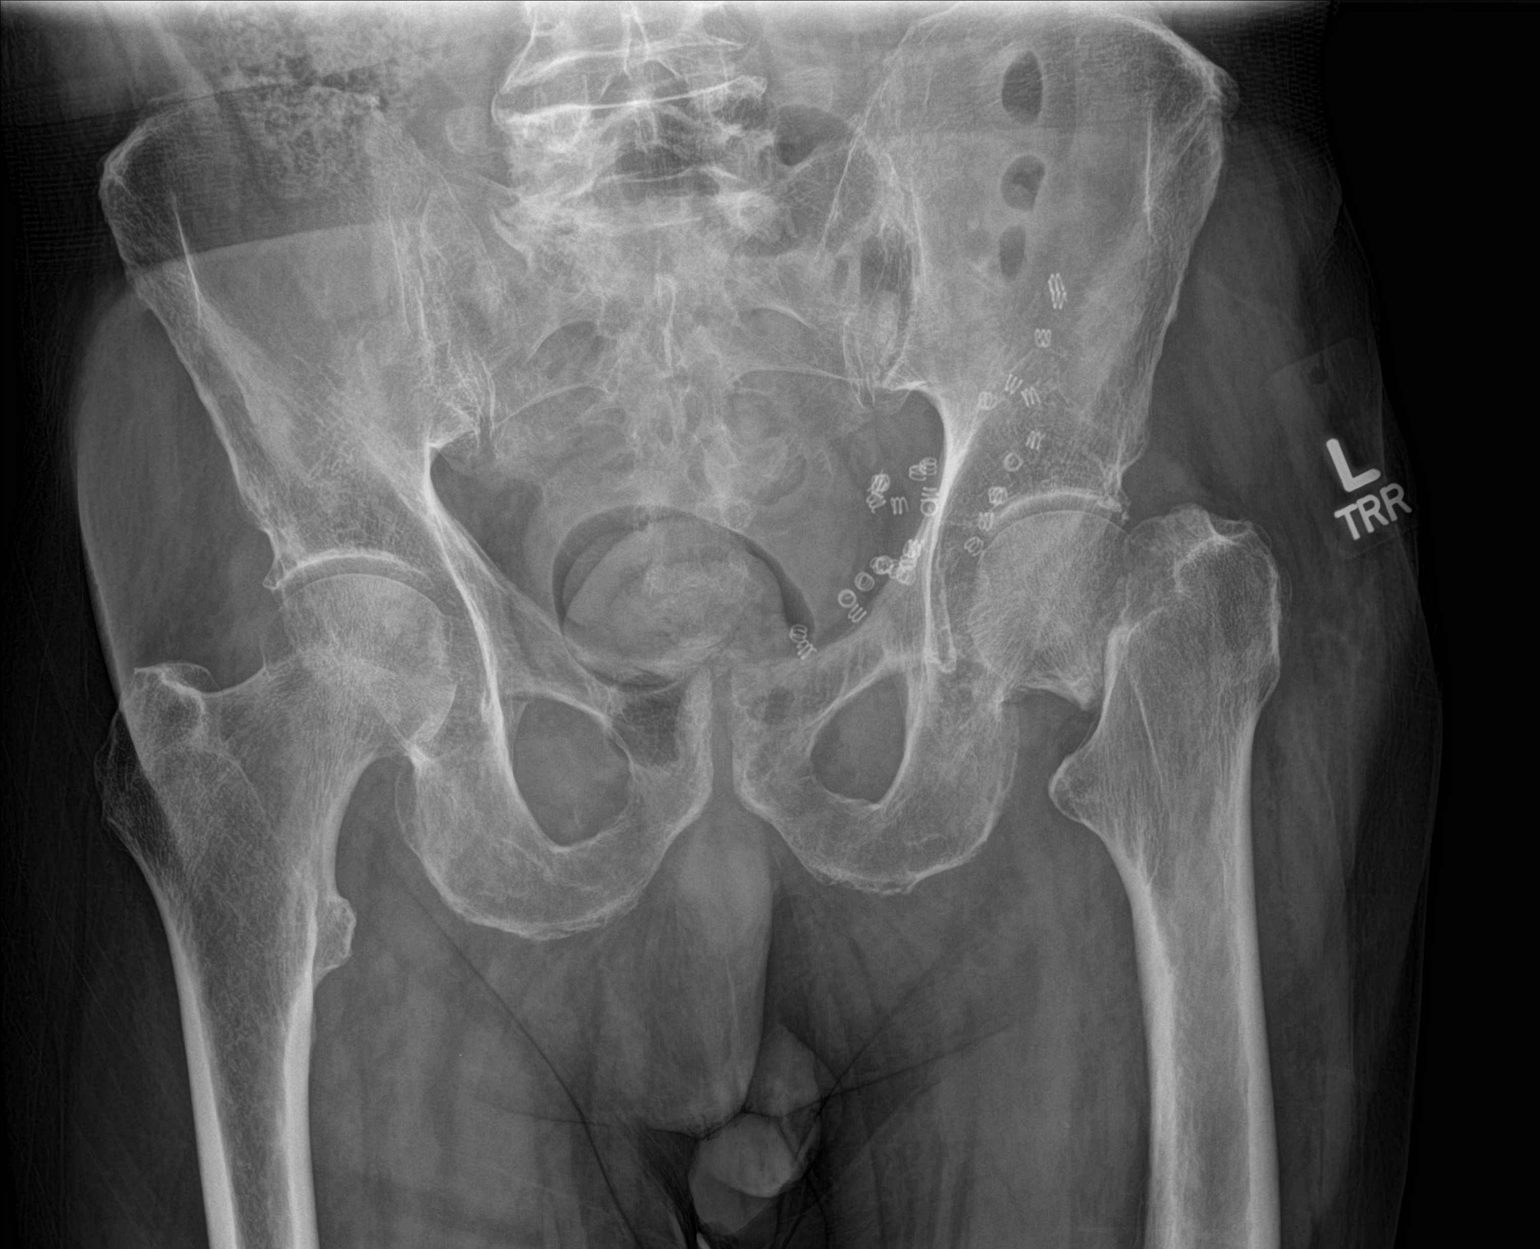
[im 2/3]
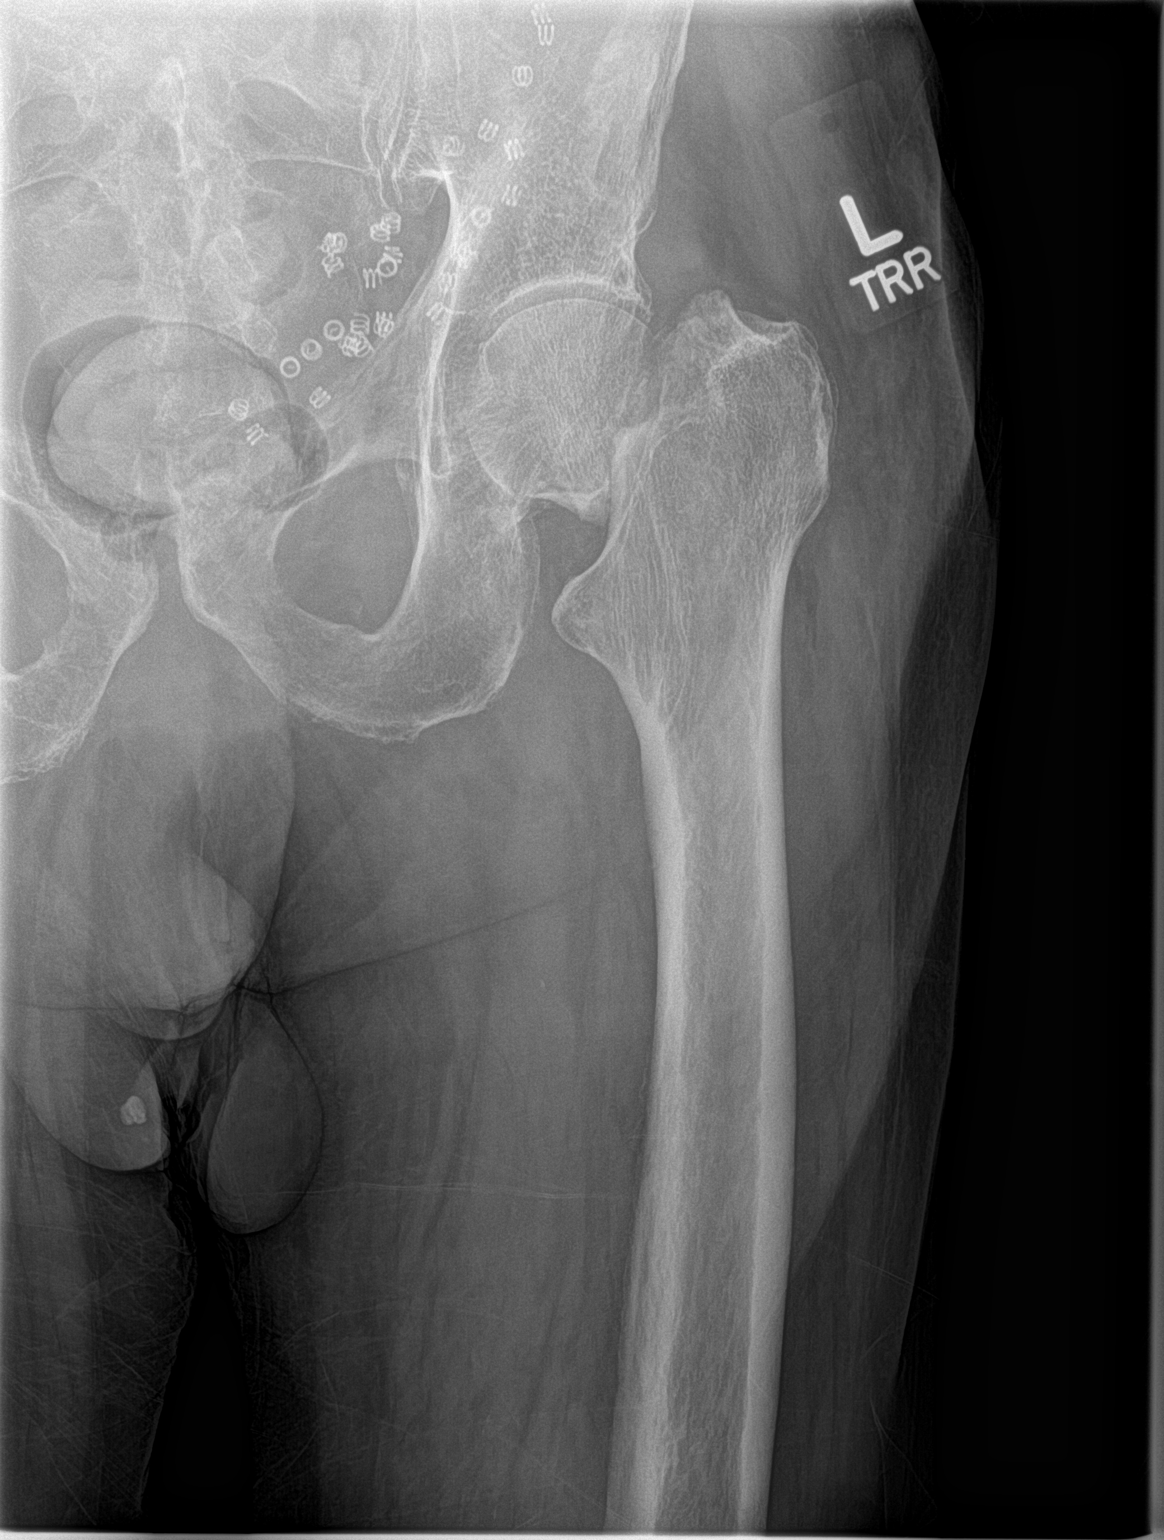
[im 3/3]
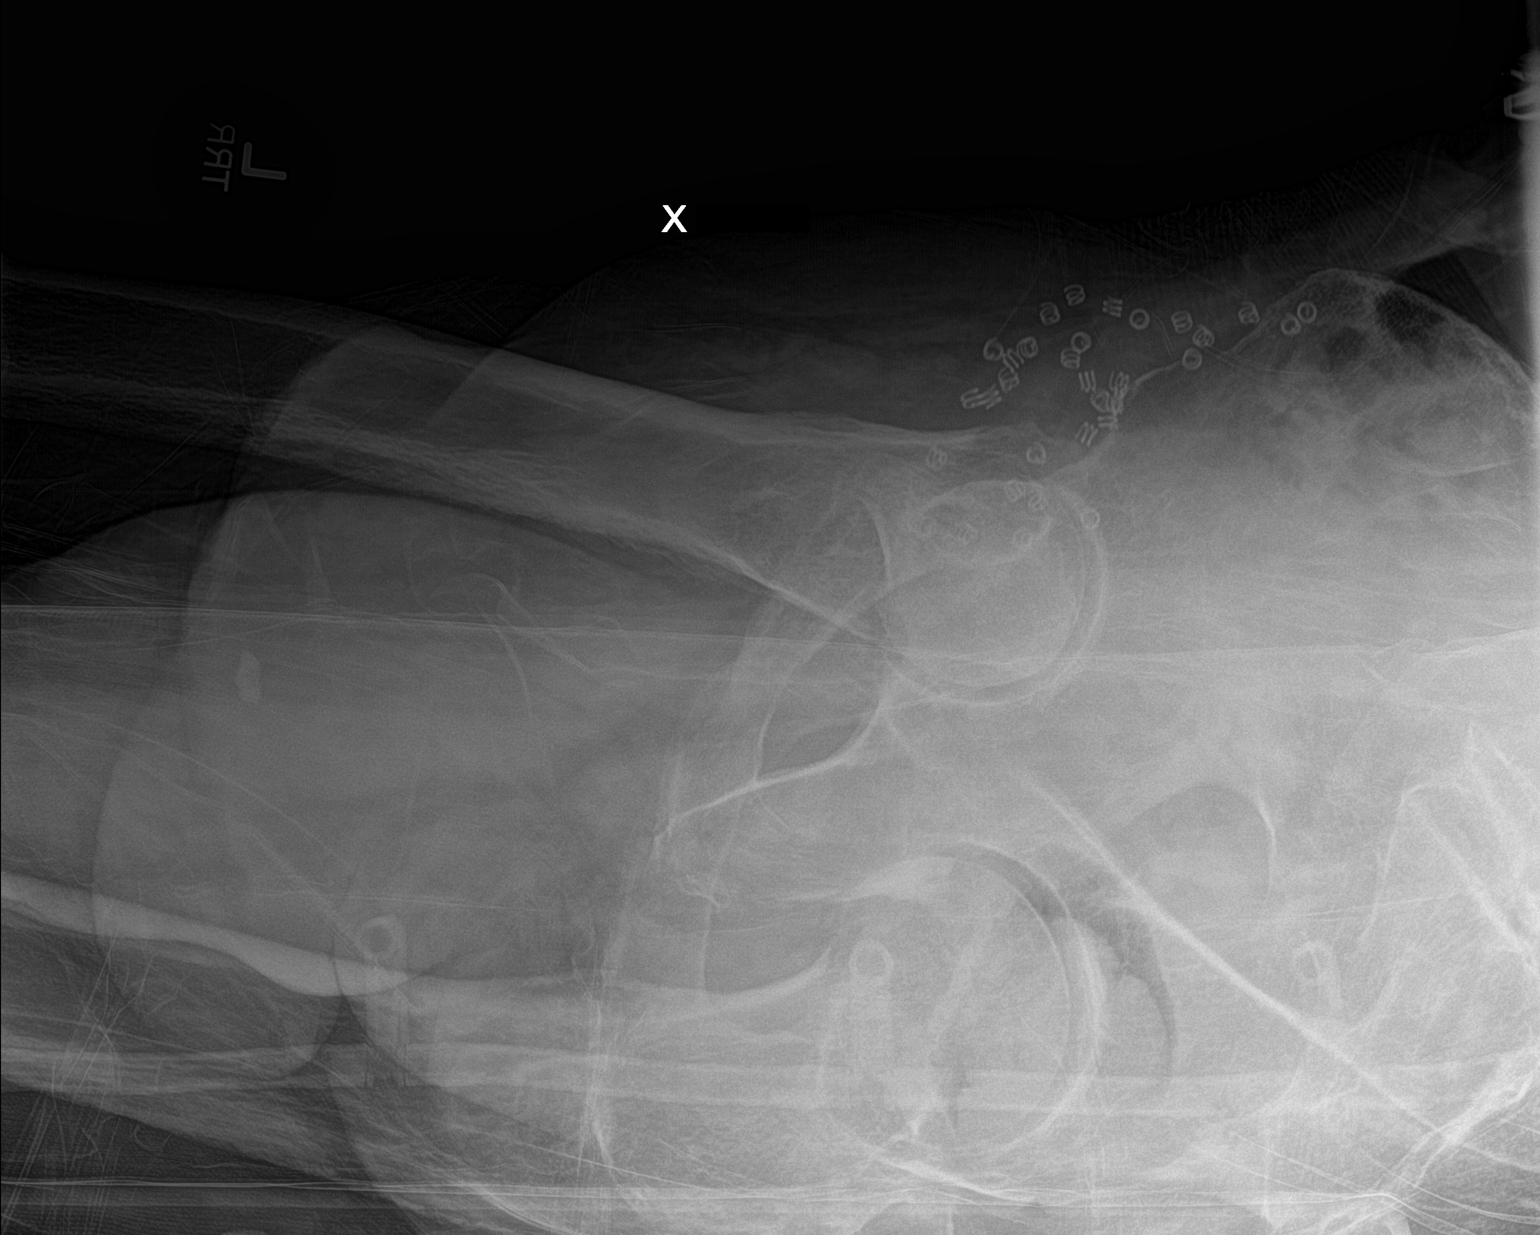

[3 of 3 positions shown; findings below may reference images not displayed]

FINDINGS: Displaced left femoral neck fracture with foreshortening of the
femoral shaft. No significant angulation. Femoral head is seated in
the acetabulum. No additional fracture of the bony pelvis. Multiple
tacks from prior left inguinal hernia repair.
IMPRESSION: Displaced left femoral neck fracture with foreshortening.

## 2017-12-13 IMAGING — CR DG CHEST 1V
1 series · 1 of 1 positions shown · non-contrast
Comparison: None.

CLINICAL DATA: Fall

EXAM:
CHEST 1 VIEW

[dg chest 1 view]
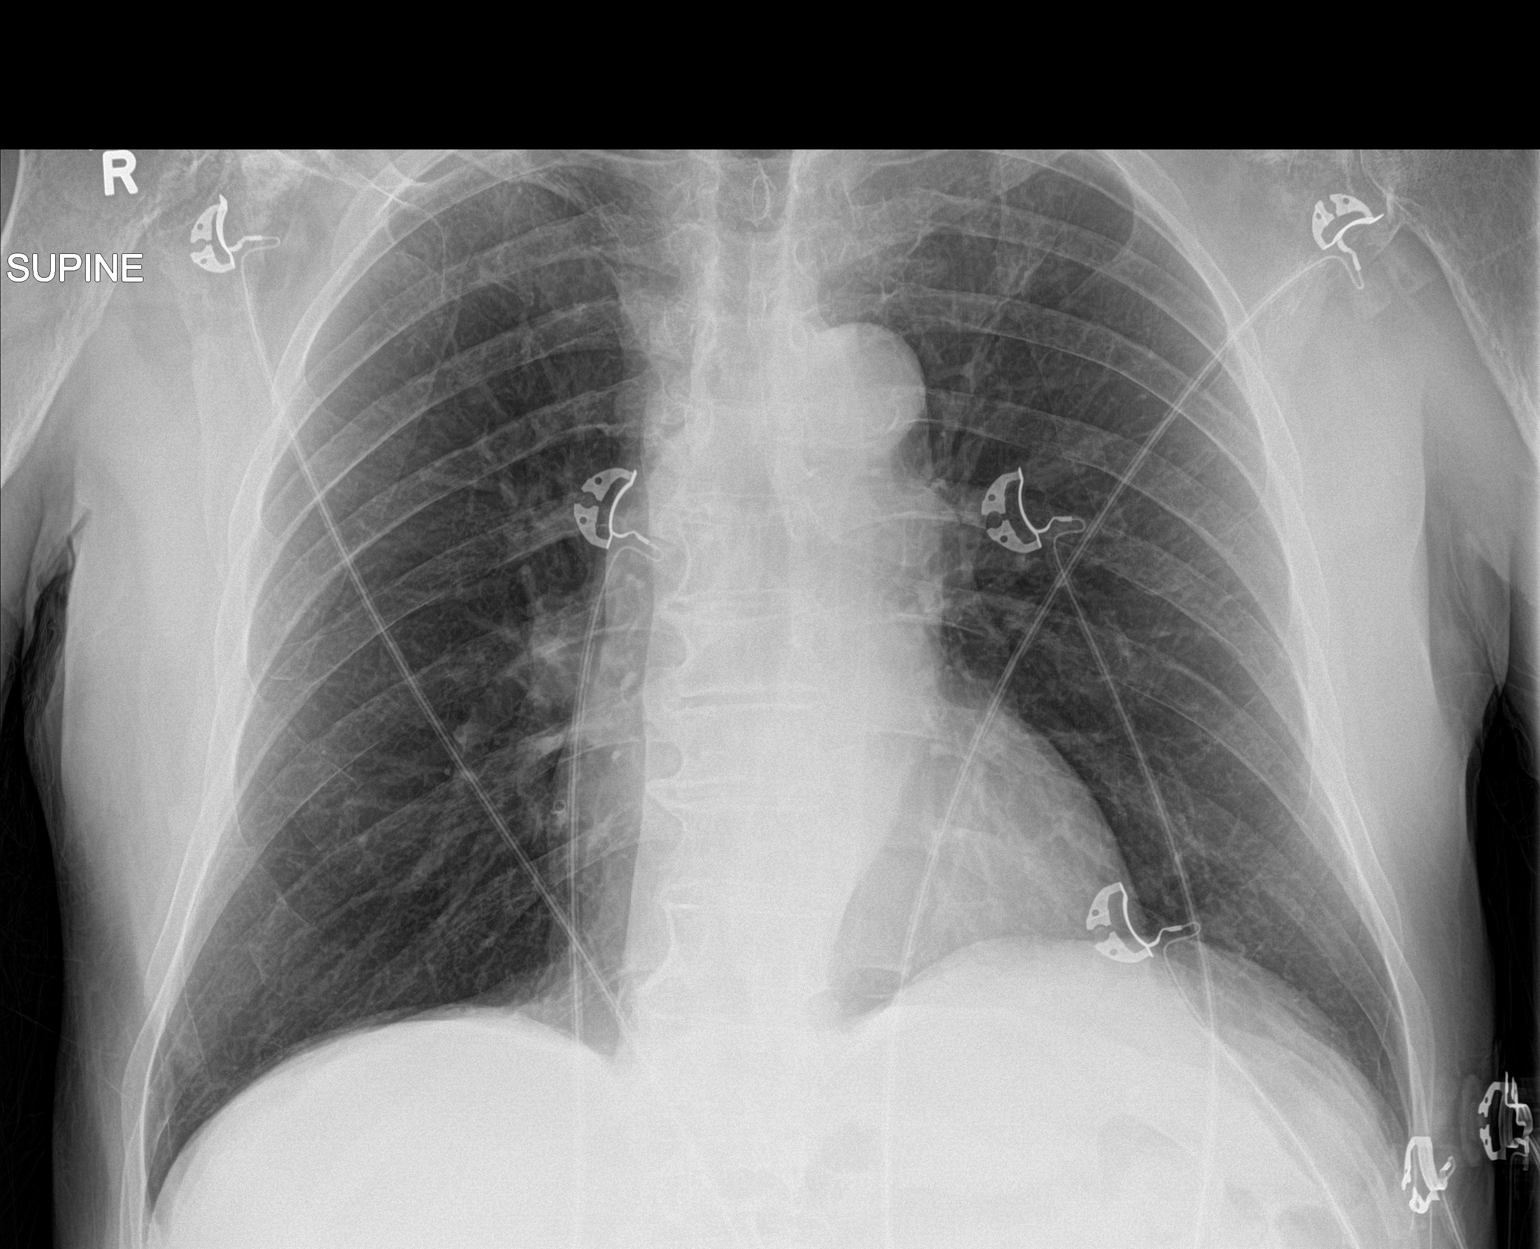

[1 of 1 positions shown; findings below may reference images not displayed]

FINDINGS: The heart size and mediastinal contours are within normal limits.
Both lungs are clear. The visualized skeletal structures are
unremarkable.
IMPRESSION: No active disease.

## 2017-12-14 IMAGING — DX DG HIP (WITH OR WITHOUT PELVIS) 2-3V*L*
2 series · 2 of 2 positions shown · non-contrast
Comparison: 07/05/2016 preoperative study

CLINICAL DATA: Postop bipolar hip arthroplasty

EXAM:
DG HIP (WITH OR WITHOUT PELVIS) 2-3V LEFT

[pelvis ap]
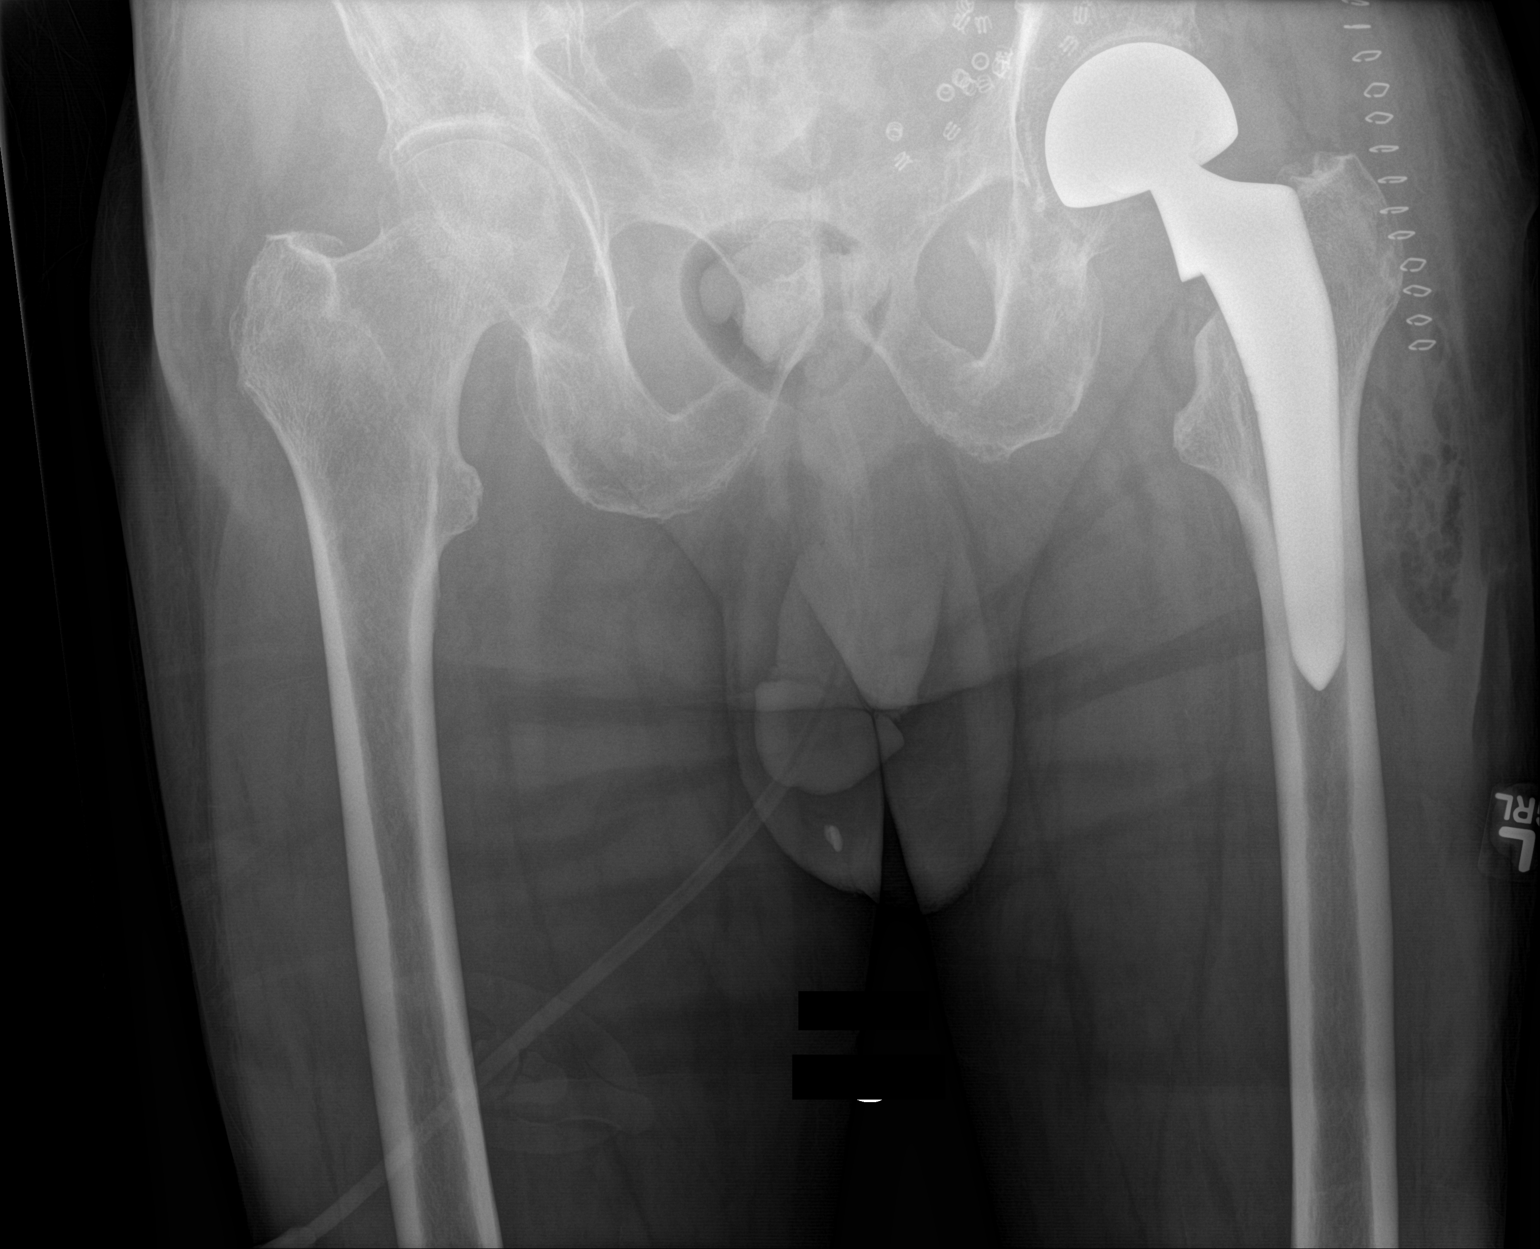

[hip lat]
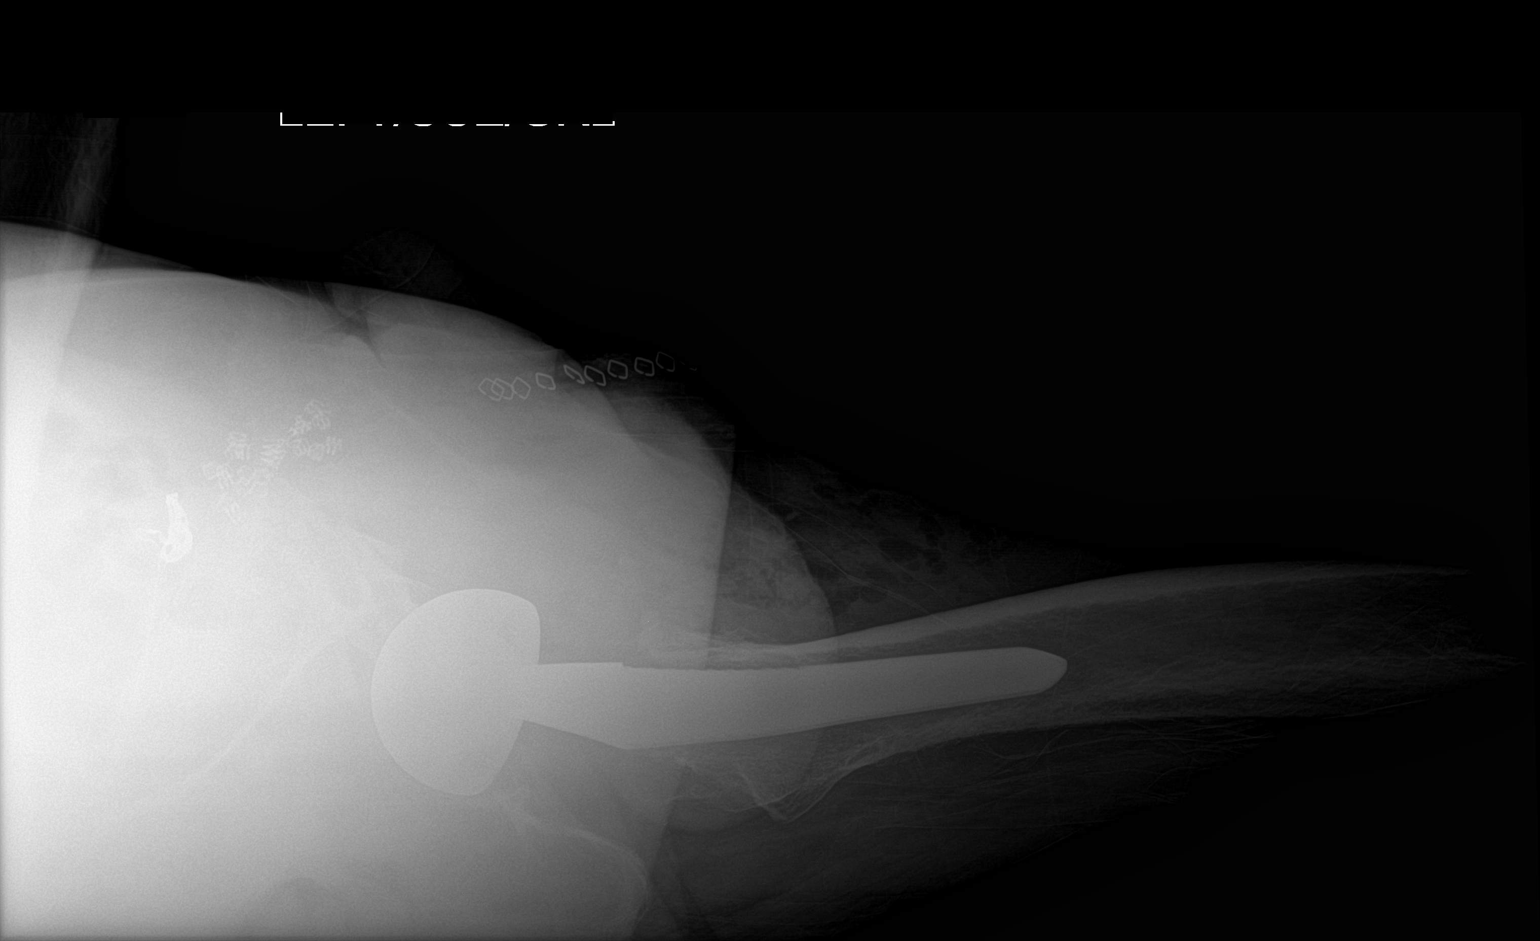

[2 of 2 positions shown; findings below may reference images not displayed]

FINDINGS: New uncemented bipolar left hip arthroplasty in anatomic position
with postoperative subcutaneous emphysema and overlying skin staples
noted. No acute postoperative fracture nor hardware failure. Native
right hip is unremarkable. Foley catheter is partially imaged.
IMPRESSION: New left uncemented bipolar hip arthroplasty.

## 2018-01-02 ENCOUNTER — Ambulatory Visit: Payer: Medicare Other | Admitting: Internal Medicine

## 2018-01-09 ENCOUNTER — Ambulatory Visit: Payer: Medicare Other | Admitting: Internal Medicine

## 2018-01-09 VITALS — BP 145/70 | HR 74 | Temp 98.4°F | Resp 18 | Wt 164.2 lb

## 2018-01-09 DIAGNOSIS — J209 Acute bronchitis, unspecified: Secondary | ICD-10-CM | POA: Diagnosis not present

## 2018-01-16 ENCOUNTER — Ambulatory Visit: Payer: Medicare Other | Admitting: Internal Medicine

## 2018-01-16 ENCOUNTER — Encounter: Payer: Self-pay | Admitting: Internal Medicine

## 2018-01-16 VITALS — BP 142/70 | HR 66 | Temp 97.5°F | Resp 20 | Wt 162.0 lb

## 2018-01-16 DIAGNOSIS — G20 Parkinson's disease: Secondary | ICD-10-CM

## 2018-01-16 DIAGNOSIS — F39 Unspecified mood [affective] disorder: Secondary | ICD-10-CM

## 2018-01-16 DIAGNOSIS — I10 Essential (primary) hypertension: Secondary | ICD-10-CM | POA: Diagnosis not present

## 2018-01-16 DIAGNOSIS — N401 Enlarged prostate with lower urinary tract symptoms: Secondary | ICD-10-CM | POA: Diagnosis not present

## 2018-01-16 DIAGNOSIS — N138 Other obstructive and reflux uropathy: Secondary | ICD-10-CM

## 2018-01-16 DIAGNOSIS — J4531 Mild persistent asthma with (acute) exacerbation: Secondary | ICD-10-CM | POA: Diagnosis not present

## 2018-01-16 NOTE — Assessment & Plan Note (Signed)
BP Readings from Last 3 Encounters:  01/16/18 (!) 142/70  10/21/17 134/71  07/18/17 (!) 148/64   Good control

## 2018-01-16 NOTE — Assessment & Plan Note (Signed)
Recent exacerbation with bronchitis is better Will add albuterol rescue inhaler

## 2018-01-16 NOTE — Assessment & Plan Note (Signed)
Ongoing symptoms but fair control Secretions are an issue--discussed No major functional decline Handicapped placard form done

## 2018-01-16 NOTE — Assessment & Plan Note (Signed)
Frequent nocturia but otherwise doing okay No changes needed

## 2018-01-16 NOTE — Progress Notes (Signed)
Subjective:    Patient ID: Brandon Calhoun, male    DOB: [birth year], 84 y.o.   MRN: [id number]  HPI Visit in [place] apartment for follow up of chronic health conditions Reviewed status with facility RN  Just got over bronchitis Feels this is better Breathing is better Still gets mucus after eating--especially milk in cereal No regular cough Asthma mildly worse---with some wheezing. This is better now  Ongoing Parkinson's symptoms Walks pretty well with walker or rollator Some freezing--but not bad Doesn't notice sinemet wearing off Ongoing drooling Will be starting the boxing program for Parkinsons  No chest pain No palpitations No dizziness No edema  Mood has been fine No depression Not anhedonic Still with regular anxiety--feels like he gets "trembly" at times--not severe though  Voids okay Seems to empty well in day Still with frequent nocturia  Current Outpatient Medications on File Prior to Visit  Medication Sig Dispense Refill  . budesonide-formoterol (SYMBICORT) 160-4.5 MCG/ACT inhaler Inhale 2 puffs into the lungs 2 (two) times daily.    . carbidopa-levodopa (SINEMET IR) 25-100 MG tablet Take 1 tablet by mouth 3 (three) times daily. 90 tablet 11  . fluticasone (FLONASE) 50 MCG/ACT nasal spray Place 2 sprays into the nose daily.    . montelukast (SINGULAIR) 10 MG tablet Take 10 mg by mouth at bedtime.    Marland Kitchen. omeprazole (PRILOSEC) 20 MG capsule Take 1 capsule by mouth daily.    . citalopram (CELEXA) 10 MG tablet Take 1 tablet by mouth daily.    . montelukast (SINGULAIR) 10 MG tablet Take 1 tablet by mouth daily.     No current facility-administered medications on file prior to visit.     No Known Allergies  Past Medical History:  Diagnosis Date  . Asthma   . Edema    MILD OF FEET  . GERD (gastroesophageal reflux disease)   . Hypertension   . Mood disorder (HCC)    chronic anxiety    Past Surgical History:  Procedure Laterality Date  . CATARACT  EXTRACTION W/PHACO Left 02/16/2016   Procedure: CATARACT EXTRACTION PHACO AND INTRAOCULAR LENS PLACEMENT (IOC);  Surgeon: Brandon CraneBradley Mark King, MD;  Location: [facility];  Service: Ophthalmology;  Laterality: Left;  US 1.10 AP% 10.6 CDE 7.41 Fluid Pack Lot # [id number] H  . CATARACT EXTRACTION W/PHACO Right 03/08/2016   Procedure: CATARACT EXTRACTION PHACO AND INTRAOCULAR LENS PLACEMENT (IOC);  Surgeon: Brandon CraneBradley Mark King, MD;  Location: [facility];  Service: Ophthalmology;  Laterality: Right;  US 1.14 AP% 11.6 CDE 11.80 Fluid Pack Lot # [id number] H  . HERNIA REPAIR    . HIP ARTHROPLASTY Left 07/06/2016   Procedure: ARTHROPLASTY BIPOLAR HIP (HEMIARTHROPLASTY);  Surgeon: Brandon BuckerMichael Menz, MD;  Location: [facility];  Service: Orthopedics;  Laterality: Left;    Family History  Problem Relation Age of Onset  . COPD Brother   . Hypertension Neg Hx   . Diabetes Neg Hx   . Heart disease Neg Hx     Social History   Socioeconomic History  . Marital status: Widowed    Spouse name: Not on file  . Number of children: Not on file  . Years of education: Not on file  . Highest education level: Not on file  Occupational History  . 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 Financial resource strain: Not on file  . Food insecurity:    Worry: Not on file    Inability: Not on file  . Transportation needs:  Medical: Not on file    Non-medical: Not on file  Tobacco Use  . Smoking status: Never Smoker  . Smokeless tobacco: Never Used  Substance and Sexual Activity  . Alcohol use: No  . Drug use: No  . Sexual activity: Not on file  Lifestyle  . Physical activity:    Days per week: Not on file    Minutes per session: Not on file  . Stress: Not on file  Relationships  . Social connections:    Talks on phone: Not on file    Gets together: Not on file    Attends religious service: Not on file    Active member of club or organization: Not on file    Attends  meetings of clubs or organizations: Not on file    Relationship status: Not on file  . Intimate partner violence:    Fear of current or ex partner: Not on file    Emotionally abused: Not on file    Physically abused: Not on file    Forced sexual activity: Not on file  Other Topics Concern  . Not on file  Social History Narrative   Widowed 2003. 1 daughter      Has living will   Daughter Brandon Calhoun is health care POA   Would accept resuscitation attempts   No tube feeds if cognitively unaware   Review of Systems Not sleeping well--- frequent nocturia No apparent REM sleep disorder Appetite is fine Weight stable Bowels move okay    Objective:   Physical Exam  Constitutional: He appears well-developed. No distress.  Neck: No thyromegaly present.  Cardiovascular: Normal rate, regular rhythm and normal heart sounds. Exam reveals no gallop.  No murmur heard. Respiratory: Effort normal. No respiratory distress. He has no wheezes. He has no rales.  Slight expiratory rhonchi but not tight  GI: Soft. There is no tenderness.  Musculoskeletal: He exhibits no edema.  Lymphadenopathy:    He has no cervical adenopathy.  Neurological:  Normal tone No sig tremor now Mild to moderate bradykinesia  Psychiatric: He has a normal mood and affect. His behavior is normal.           Assessment & Plan:

## 2018-01-16 NOTE — Assessment & Plan Note (Signed)
Ongoing mild anxiety Not really depressed Doing well with low dose citalopram

## 2018-01-22 ENCOUNTER — Encounter: Payer: Self-pay | Admitting: Internal Medicine

## 2018-01-22 NOTE — Progress Notes (Signed)
HPI  Asked to check resident in [address] C/o runny nose, cough, wheezing and mild shortness of breath. He is blowing clear mucous out of his nose.  The cough is productive of green mucous He denies fever, chills or body aches. He has been taking Tylenol and Robitussin with minimal relief.  He has a history of allergies and asthma, on Singulair, Symbicort and Albuterol prn  Review of Systems      Past Medical History:  Diagnosis Date  . Asthma   . Edema    MILD OF FEET  . GERD (gastroesophageal reflux disease)   . Hypertension   . Mood disorder (HCC)    chronic anxiety    Family History  Problem Relation Age of Onset  . COPD Brother   . Hypertension Neg Hx   . Diabetes Neg Hx   . Heart disease Neg Hx     Social History   Socioeconomic History  . Marital status: Widowed    Spouse name: Not on file  . Number of children: Not on file  . Years of education: Not on file  . Highest education level: Not on file  Occupational History  . Occupation: Therapist, nutritional    Comment: Retired  Engineer, production  . Financial resource strain: Not on file  . Food insecurity:    Worry: Not on file    Inability: Not on file  . Transportation needs:    Medical: Not on file    Non-medical: Not on file  Tobacco Use  . Smoking status: Never Smoker  . Smokeless tobacco: Never Used  Substance and Sexual Activity  . Alcohol use: No  . Drug use: No  . Sexual activity: Not on file  Lifestyle  . Physical activity:    Days per week: Not on file    Minutes per session: Not on file  . Stress: Not on file  Relationships  . Social connections:    Talks on phone: Not on file    Gets together: Not on file    Attends religious service: Not on file    Active member of club or organization: Not on file    Attends meetings of clubs or organizations: Not on file    Relationship status: Not on file  . Intimate partner violence:    Fear of current or ex partner: Not on file     Emotionally abused: Not on file    Physically abused: Not on file    Forced sexual activity: Not on file  Other Topics Concern  . Not on file  Social History Narrative   Widowed 2003. 1 daughter      Has living will   Daughter Darl Pikes is health care POA   Would accept resuscitation attempts   No tube feeds if cognitively unaware    No Known Allergies   Constitutional: Denies headache, fatigue, fever or abrupt weight changes.  HEENT:  Positive runny nose. Denies eye redness, eye pain, pressure behind the eyes, facial pain, nasal congestion, ear pain, ringing in the ears, wax buildup, runny nose or sore throat. Respiratory: Positive cough, wheezing. Denies difficulty breathing or shortness of breath.  Cardiovascular: Denies chest pain, chest tightness, palpitations or swelling in the hands or feet.   No other specific complaints in a complete review of systems (except as listed in HPI above).  Objective:   BP (!) 145/70   Pulse 74   Temp 98.4 F (36.9 C)   Resp 18   Wt 164  lb 3.2 oz (74.5 kg)   BMI 24.25 kg/m  Wt Readings from Last 3 Encounters:  01/16/18 162 lb (73.5 kg)  01/22/18 164 lb 3.2 oz (74.5 kg)  10/21/17 160 lb 12.8 oz (72.9 kg)     General: Appears his stated age, well developed, well nourished in NAD. HEENT: Head: normal shape and size;  Throat/Mouth: + PND. Teeth present, mucosa pink and moist, no exudate noted, no lesions or ulcerations noted.  Neck: No cervical lymphadenopathy.   Pulmonary/Chest: Normal effort with scattered rhonchi throughout. No respiratory distress. No wheezes, rales noted.       Assessment & Plan:   Acute Bronchitis:  Get some rest and drink plenty of water eRx for Augmentin BID x 10 days Mucinex 600 mg every 12 hours x 3 days Continue Robitussin and Tylenol for cough  Will reassess as needed   Nicki Reaperegina Keon Benscoter, NP

## 2018-01-22 NOTE — Patient Instructions (Signed)

## 2018-02-26 ENCOUNTER — Ambulatory Visit: Payer: Medicare Other | Admitting: Internal Medicine

## 2018-02-26 VITALS — BP 137/68 | HR 81 | Temp 98.5°F | Resp 18

## 2018-02-26 DIAGNOSIS — R531 Weakness: Secondary | ICD-10-CM | POA: Diagnosis not present

## 2018-02-26 DIAGNOSIS — R0989 Other specified symptoms and signs involving the circulatory and respiratory systems: Secondary | ICD-10-CM | POA: Diagnosis not present

## 2018-02-26 DIAGNOSIS — R5381 Other malaise: Secondary | ICD-10-CM | POA: Diagnosis not present

## 2018-02-26 DIAGNOSIS — R5383 Other fatigue: Secondary | ICD-10-CM

## 2018-02-26 DIAGNOSIS — R05 Cough: Secondary | ICD-10-CM | POA: Diagnosis not present

## 2018-02-26 DIAGNOSIS — M6281 Muscle weakness (generalized): Secondary | ICD-10-CM | POA: Diagnosis not present

## 2018-02-27 DIAGNOSIS — R05 Cough: Secondary | ICD-10-CM | POA: Diagnosis not present

## 2018-02-27 DIAGNOSIS — R0602 Shortness of breath: Secondary | ICD-10-CM | POA: Diagnosis not present

## 2018-02-27 DIAGNOSIS — R531 Weakness: Secondary | ICD-10-CM | POA: Diagnosis not present

## 2018-02-28 ENCOUNTER — Encounter: Payer: Self-pay | Admitting: Internal Medicine

## 2018-02-28 NOTE — Patient Instructions (Signed)

## 2018-02-28 NOTE — Progress Notes (Signed)
Subjective:    Patient ID: Brandon Calhoun, male    DOB: [birth year], 84 y.o.   MRN: [id number]  HPI  Asked to see resident in [address] Resident c/o generalized weakness, fatigue, difficulty walking and increased tremor. Symptoms started about 2 days ago No fever, runny nose, nasal congestion, ear pain, sore throat or cough. No chest pain or shortness of breath. No nausea, vomiting, diarrhea but does report decreased appetite No recent medication changes RN reports he was exposed to flu by activity coordinator.  Review of Systems      Past Medical History:  Diagnosis Date  . Asthma   . Edema    MILD OF FEET  . GERD (gastroesophageal reflux disease)   . Hypertension   . Mood disorder (HCC)    chronic anxiety    Current Outpatient Medications  Medication Sig Dispense Refill  . albuterol (PROVENTIL HFA;VENTOLIN HFA) 108 (90 Base) MCG/ACT inhaler Inhale 2 puffs into the lungs 3 (three) times daily as needed for wheezing or shortness of breath.    . budesonide-formoterol (SYMBICORT) 160-4.5 MCG/ACT inhaler Inhale 2 puffs into the lungs 2 (two) times daily.    . carbidopa-levodopa (SINEMET IR) 25-100 MG tablet Take 1 tablet by mouth 3 (three) times daily. 90 tablet 11  . citalopram (CELEXA) 10 MG tablet Take 1 tablet by mouth daily.    . fluticasone (FLONASE) 50 MCG/ACT nasal spray Place 2 sprays into the nose daily.    . montelukast (SINGULAIR) 10 MG tablet Take 1 tablet by mouth daily.    . montelukast (SINGULAIR) 10 MG tablet Take 10 mg by mouth at bedtime.    Marland Kitchen omeprazole (PRILOSEC) 20 MG capsule Take 1 capsule by mouth daily.     No current facility-administered medications for this visit.     No Known Allergies  Family History  Problem Relation Age of Onset  . COPD Brother   . Hypertension Neg Hx   . Diabetes Neg Hx   . Heart disease Neg Hx     Social History   Socioeconomic History  . Marital status: Widowed    Spouse name: Not on file  . Number of  children: Not on file  . Years of education: Not on file  . Highest education level: Not on file  Occupational History  . Occupation: Therapist, nutritional    Comment: Retired  Engineer, production  . Financial resource strain: Not on file  . Food insecurity:    Worry: Not on file    Inability: Not on file  . Transportation needs:    Medical: Not on file    Non-medical: Not on file  Tobacco Use  . Smoking status: Never Smoker  . Smokeless tobacco: Never Used  Substance and Sexual Activity  . Alcohol use: No  . Drug use: No  . Sexual activity: Not on file  Lifestyle  . Physical activity:    Days per week: Not on file    Minutes per session: Not on file  . Stress: Not on file  Relationships  . Social connections:    Talks on phone: Not on file    Gets together: Not on file    Attends religious service: Not on file    Active member of club or organization: Not on file    Attends meetings of clubs or organizations: Not on file    Relationship status: Not on file  . Intimate partner violence:    Fear of current or ex partner: Not  on file    Emotionally abused: Not on file    Physically abused: Not on file    Forced sexual activity: Not on file  Other Topics Concern  . Not on file  Social History Narrative   Widowed 2003. 1 daughter      Has living will   Daughter Darl Pikes is health care POA   Would accept resuscitation attempts   No tube feeds if cognitively unaware     Constitutional: Pt reports fatigue, malaise. Denies fever, headache or abrupt weight changes.  HEENT: Denies eye pain, eye redness, ear pain, ringing in the ears, wax buildup, runny nose, nasal congestion, bloody nose, or sore throat. Respiratory: Denies difficulty breathing, shortness of breath, cough or sputum production.   Cardiovascular: Denies chest pain, chest tightness, palpitations or swelling in the hands or feet.  Gastrointestinal: Denies abdominal pain, bloating, constipation, diarrhea  or blood in the stool.  GU: Denies urgency, frequency, pain with urination, burning sensation, blood in urine, odor or discharge. Musculoskeletal: Pt reports weakness. Denies decrease in range of motion, difficulty with gait, muscle pain or joint pain and swelling.  Skin: Denies redness, rashes, lesions or ulcercations.  Neurological: Pt reports difficulty with balance. Denies dizziness, difficulty with memory, difficulty with speechtion.    No other specific complaints in a complete review of systems (except as listed in HPI above).  Objective:   Physical Exam  BP 137/68   Pulse 81   Temp 98.5 F (36.9 C)   Resp 18  Wt Readings from Last 3 Encounters:  01/16/18 162 lb (73.5 kg)  01/22/18 164 lb 3.2 oz (74.5 kg)  10/21/17 160 lb 12.8 oz (72.9 kg)    General: Appears his stated age, in NAD. Skin: Warm, dry and intact. No rashesnoted. HEENT: Throat/Mouth: Teeth present, mucosa pink and moist, no exudate, lesions or ulcerations noted.  Neck:  No adenopathy noted. Cardiovascular: Normal rate and rhythm. S1,S2 noted.  No murmur, rubs or gallops noted. Pulmonary/Chest: Normal effort and positive vesicular breath sounds. No respiratory distress. No wheezes, rales or ronchi noted.  Abdomen: Soft and nontender. Normal bowel sounds.  Neurological: Alert and oriented. Resting tremor noted.  BMET    Component Value Date/Time   NA 137 08/20/2016 1145   K 3.7 08/20/2016 1145   CL 106 08/20/2016 1145   CO2 24 08/20/2016 1145   GLUCOSE 113 (H) 08/20/2016 1145   BUN 20 08/20/2016 1145   CREATININE 0.72 08/20/2016 1145   CALCIUM 8.6 (L) 08/20/2016 1145   GFRNONAA >60 08/20/2016 1145   GFRAA >60 08/20/2016 1145    Lipid Panel  No results found for: CHOL, TRIG, HDL, CHOLHDL, VLDL, LDLCALC  CBC    Component Value Date/Time   WBC 10.9 (H) 08/20/2016 1145   RBC 3.97 (L) 08/20/2016 1145   HGB 12.4 (L) 08/20/2016 1145   HCT 35.9 (L) 08/20/2016 1145   PLT 346 08/20/2016 1145   MCV  90.4 08/20/2016 1145   MCH 31.2 08/20/2016 1145   MCHC 34.5 08/20/2016 1145   RDW 14.9 (H) 08/20/2016 1145   LYMPHSABS 1.8 08/20/2016 1145   MONOABS 0.7 08/20/2016 1145   EOSABS 0.1 08/20/2016 1145   BASOSABS 0.0 08/20/2016 1145    Hgb A1C No results found for: HGBA1C          Assessment & Plan:   Fatigue, Malaise, Weakness:  Chest xray to r/o PNA, will start Levaquin x 7 day if positive CBC and CMET today Will hold off on urinalysis  and culture at this time Continue Tylenol, encourage rest and fluids D/w daughter Karsten RoSusan  Will follow up after labs and xray, will reassess as needed Nicki Reaperegina Kamora Vossler, NP

## 2018-03-09 DIAGNOSIS — S7001XA Contusion of right hip, initial encounter: Secondary | ICD-10-CM | POA: Diagnosis not present

## 2018-03-09 DIAGNOSIS — W19XXXA Unspecified fall, initial encounter: Secondary | ICD-10-CM | POA: Diagnosis not present

## 2018-03-09 DIAGNOSIS — S40012A Contusion of left shoulder, initial encounter: Secondary | ICD-10-CM | POA: Diagnosis not present

## 2018-04-11 ENCOUNTER — Ambulatory Visit: Payer: Medicare Other | Admitting: Internal Medicine

## 2018-04-11 DIAGNOSIS — K219 Gastro-esophageal reflux disease without esophagitis: Secondary | ICD-10-CM | POA: Diagnosis not present

## 2018-04-11 DIAGNOSIS — G20 Parkinson's disease: Secondary | ICD-10-CM

## 2018-04-11 DIAGNOSIS — J4531 Mild persistent asthma with (acute) exacerbation: Secondary | ICD-10-CM | POA: Diagnosis not present

## 2018-04-11 DIAGNOSIS — N138 Other obstructive and reflux uropathy: Secondary | ICD-10-CM

## 2018-04-11 DIAGNOSIS — I10 Essential (primary) hypertension: Secondary | ICD-10-CM | POA: Diagnosis not present

## 2018-04-11 DIAGNOSIS — F39 Unspecified mood [affective] disorder: Secondary | ICD-10-CM

## 2018-04-11 DIAGNOSIS — N401 Enlarged prostate with lower urinary tract symptoms: Secondary | ICD-10-CM

## 2018-04-20 ENCOUNTER — Encounter: Payer: Self-pay | Admitting: Internal Medicine

## 2018-04-20 NOTE — Assessment & Plan Note (Signed)
Will try to wean Omeprazole to 20 mg daily

## 2018-04-20 NOTE — Assessment & Plan Note (Signed)
Continue Flomax Will monitor 

## 2018-04-20 NOTE — Assessment & Plan Note (Signed)
Controlled off meds  Will monitor 

## 2018-04-20 NOTE — Progress Notes (Signed)
Subjective:    Patient ID: Brandon Calhoun, male    DOB: [birth year], 84 y.o.   MRN: [id number]  HPI  Routine follow up for resident in [address] Reviewed with RN, no new concerns Resident reports sleep interrupted by nocturia. He gets assistance with ADL's by private aid Walks with a walker Appetite good, weight stable Denies pain, reflux or persistent SOB  Asthma: Controlled on Symbicort, Singulair and Flonase.  GERD: He denies breakthrough on Omeprazole. He denies swallowing issues.  HTN: Controlled off meds. ECG from 06/2016 reviewed.   Mood Disorder: Mainly anxiety. No depression. Stable on Celexa.  BPH: Mainly nocturia. Taking Flomax which seems to help.  Parkinson's: He participates in a boxing class weekly which he enjoys. Stable on Sinemet.  Review of Systems  Past Medical History:  Diagnosis Date  . Asthma   . Edema    MILD OF FEET  . GERD (gastroesophageal reflux disease)   . Hypertension   . Mood disorder (HCC)    chronic anxiety    Current Outpatient Medications  Medication Sig Dispense Refill  . albuterol (PROVENTIL HFA;VENTOLIN HFA) 108 (90 Base) MCG/ACT inhaler Inhale 2 puffs into the lungs 3 (three) times daily as needed for wheezing or shortness of breath.    . budesonide-formoterol (SYMBICORT) 160-4.5 MCG/ACT inhaler Inhale 2 puffs into the lungs 2 (two) times daily.    . carbidopa-levodopa (SINEMET IR) 25-100 MG tablet Take 1 tablet by mouth 3 (three) times daily. 90 tablet 11  . citalopram (CELEXA) 10 MG tablet Take 10 mg by mouth daily.    . fluticasone (FLONASE) 50 MCG/ACT nasal spray Place 2 sprays into the nose daily.    . montelukast (SINGULAIR) 10 MG tablet Take 10 mg by mouth at bedtime.    Marland Kitchen omeprazole (PRILOSEC) 20 MG capsule Take 1 capsule by mouth daily.    . tamsulosin (FLOMAX) 0.4 MG CAPS capsule Take 0.4 mg by mouth.    . citalopram (CELEXA) 10 MG tablet Take 1 tablet by mouth daily.    . montelukast (SINGULAIR) 10 MG tablet Take 1  tablet by mouth daily.     No current facility-administered medications for this visit.     No Known Allergies  Family History  Problem Relation Age of Onset  . COPD Brother   . Hypertension Neg Hx   . Diabetes Neg Hx   . Heart disease Neg Hx     Social History   Socioeconomic History  . Marital status: Widowed    Spouse name: Not on file  . Number of children: Not on file  . Years of education: Not on file  . Highest education level: Not on file  Occupational History  . Occupation: Therapist, nutritional    Comment: Retired  Engineer, production  . Financial resource strain: Not on file  . Food insecurity:    Worry: Not on file    Inability: Not on file  . Transportation needs:    Medical: Not on file    Non-medical: Not on file  Tobacco Use  . Smoking status: Never Smoker  . Smokeless tobacco: Never Used  Substance and Sexual Activity  . Alcohol use: No  . Drug use: No  . Sexual activity: Not on file  Lifestyle  . Physical activity:    Days per week: Not on file    Minutes per session: Not on file  . Stress: Not on file  Relationships  . Social connections:    Talks on phone:  Not on file    Gets together: Not on file    Attends religious service: Not on file    Active member of club or organization: Not on file    Attends meetings of clubs or organizations: Not on file    Relationship status: Not on file  . Intimate partner violence:    Fear of current or ex partner: Not on file    Emotionally abused: Not on file    Physically abused: Not on file    Forced sexual activity: Not on file  Other Topics Concern  . Not on file  Social History Narrative   Widowed 2003. 1 daughter      Has living will   Daughter Darl Pikes is health care POA   Would accept resuscitation attempts   No tube feeds if cognitively unaware     Constitutional: Denies fever, malaise, fatigue, headache or abrupt weight changes.  HEENT: Denies eye pain, eye redness, ear pain,  ringing in the ears, wax buildup, runny nose, nasal congestion, bloody nose, or sore throat. Respiratory: Denies difficulty breathing, shortness of breath, cough or sputum production.   Cardiovascular: Denies chest pain, chest tightness, palpitations or swelling in the hands or feet.  Gastrointestinal: Denies abdominal pain, bloating, constipation, diarrhea or blood in the stool.  GU: Pt reports nocturia. Denies urgency, frequency, pain with urination, burning sensation, blood in urine, odor or discharge. Musculoskeletal: Denies decrease in range of motion, difficulty with gait, muscle pain or joint pain and swelling.  Skin: Denies redness, rashes, lesions or ulcercations.  Neurological: Denies dizziness, difficulty with memory, difficulty with speech or problems with balance and coordination.  Psych: Pt reports anxiety. Denies depression, SI/HI.  No other specific complaints in a complete review of systems (except as listed in HPI above).     Objective:   Physical Exam  BP 126/74   Pulse 72   Temp 97.8 F (36.6 C)   Resp 16   Wt 162 lb 9.6 oz (73.8 kg)   BMI 24.01 kg/m  Wt Readings from Last 3 Encounters:  04/20/18 162 lb 9.6 oz (73.8 kg)  01/16/18 162 lb (73.5 kg)  01/22/18 164 lb 3.2 oz (74.5 kg)    General: Appears his stated age, well developed, well nourished in NAD. Skin: Warm, dry and intact. No ulcerations noted. Cardiovascular: Normal rate and rhythm. S1,S2 noted.  No murmur, rubs or gallops noted. No JVD or BLE edema.  Pulmonary/Chest: Normal effort and positive vesicular breath sounds. No respiratory distress. No wheezes, rales or ronchi noted.  Abdomen: Soft and nontender. Normal bowel sounds.  Musculoskeletal: Gait slow and steady with device. Neurological: Alert and oriented.  Psychiatric: Mood and affect normal. Behavior is normal. Judgment and thought content normal.     BMET    Component Value Date/Time   NA 137 08/20/2016 1145   K 3.7 08/20/2016 1145    CL 106 08/20/2016 1145   CO2 24 08/20/2016 1145   GLUCOSE 113 (H) 08/20/2016 1145   BUN 20 08/20/2016 1145   CREATININE 0.72 08/20/2016 1145   CALCIUM 8.6 (L) 08/20/2016 1145   GFRNONAA >60 08/20/2016 1145   GFRAA >60 08/20/2016 1145    Lipid Panel  No results found for: CHOL, TRIG, HDL, CHOLHDL, VLDL, LDLCALC  CBC    Component Value Date/Time   WBC 10.9 (H) 08/20/2016 1145   RBC 3.97 (L) 08/20/2016 1145   HGB 12.4 (L) 08/20/2016 1145   HCT 35.9 (L) 08/20/2016 1145   PLT 346  08/20/2016 1145   MCV 90.4 08/20/2016 1145   MCH 31.2 08/20/2016 1145   MCHC 34.5 08/20/2016 1145   RDW 14.9 (H) 08/20/2016 1145   LYMPHSABS 1.8 08/20/2016 1145   MONOABS 0.7 08/20/2016 1145   EOSABS 0.1 08/20/2016 1145   BASOSABS 0.0 08/20/2016 1145    Hgb A1C No results found for: HGBA1C          Assessment & Plan:

## 2018-04-20 NOTE — Patient Instructions (Signed)
DASH Eating Plan DASH stands for "Dietary Approaches to Stop Hypertension." The DASH eating plan is a healthy eating plan that has been shown to reduce high blood pressure (hypertension). It may also reduce your risk for type 2 diabetes, heart disease, and stroke. The DASH eating plan may also help with weight loss. What are tips for following this plan? General guidelines  Avoid eating more than 2,300 mg (milligrams) of salt (sodium) a day. If you have hypertension, you may need to reduce your sodium intake to 1,500 mg a day.  Limit alcohol intake to no more than 1 drink a day for nonpregnant women and 2 drinks a day for men. One drink equals 12 oz of beer, 5 oz of wine, or 1 oz of hard liquor.  Work with your health care provider to maintain a healthy body weight or to lose weight. Ask what an ideal weight is for you.  Get at least 30 minutes of exercise that causes your heart to beat faster (aerobic exercise) most days of the week. Activities may include walking, swimming, or biking.  Work with your health care provider or diet and nutrition specialist (dietitian) to adjust your eating plan to your individual calorie needs. Reading food labels  Check food labels for the amount of sodium per serving. Choose foods with less than 5 percent of the Daily Value of sodium. Generally, foods with less than 300 mg of sodium per serving fit into this eating plan.  To find whole grains, look for the word "whole" as the first word in the ingredient list. Shopping  Buy products labeled as "low-sodium" or "no salt added."  Buy fresh foods. Avoid canned foods and premade or frozen meals. Cooking  Avoid adding salt when cooking. Use salt-free seasonings or herbs instead of table salt or sea salt. Check with your health care provider or pharmacist before using salt substitutes.  Do not fry foods. Cook foods using healthy methods such as baking, boiling, grilling, and broiling instead.  Cook with  heart-healthy oils, such as olive, canola, soybean, or sunflower oil. Meal planning   Eat a balanced diet that includes: ? 5 or more servings of fruits and vegetables each day. At each meal, try to fill half of your plate with fruits and vegetables. ? Up to 6-8 servings of whole grains each day. ? Less than 6 oz of lean meat, poultry, or fish each day. A 3-oz serving of meat is about the same size as a deck of cards. One egg equals 1 oz. ? 2 servings of low-fat dairy each day. ? A serving of nuts, seeds, or beans 5 times each week. ? Heart-healthy fats. Healthy fats called Omega-3 fatty acids are found in foods such as flaxseeds and coldwater fish, like sardines, salmon, and mackerel.  Limit how much you eat of the following: ? Canned or prepackaged foods. ? Food that is high in trans fat, such as fried foods. ? Food that is high in saturated fat, such as fatty meat. ? Sweets, desserts, sugary drinks, and other foods with added sugar. ? Full-fat dairy products.  Do not salt foods before eating.  Try to eat at least 2 vegetarian meals each week.  Eat more home-cooked food and less restaurant, buffet, and fast food.  When eating at a restaurant, ask that your food be prepared with less salt or no salt, if possible. What foods are recommended? The items listed may not be a complete list. Talk with your dietitian about what   dietary choices are best for you. Grains Whole-grain or whole-wheat bread. Whole-grain or whole-wheat pasta. Brown rice. Oatmeal. Quinoa. Bulgur. Whole-grain and low-sodium cereals. Pita bread. Low-fat, low-sodium crackers. Whole-wheat flour tortillas. Vegetables Fresh or frozen vegetables (raw, steamed, roasted, or grilled). Low-sodium or reduced-sodium tomato and vegetable juice. Low-sodium or reduced-sodium tomato sauce and tomato paste. Low-sodium or reduced-sodium canned vegetables. Fruits All fresh, dried, or frozen fruit. Canned fruit in natural juice (without  added sugar). Meat and other protein foods Skinless chicken or turkey. Ground chicken or turkey. Pork with fat trimmed off. Fish and seafood. Egg whites. Dried beans, peas, or lentils. Unsalted nuts, nut butters, and seeds. Unsalted canned beans. Lean cuts of beef with fat trimmed off. Low-sodium, lean deli meat. Dairy Low-fat (1%) or fat-free (skim) milk. Fat-free, low-fat, or reduced-fat cheeses. Nonfat, low-sodium ricotta or cottage cheese. Low-fat or nonfat yogurt. Low-fat, low-sodium cheese. Fats and oils Soft margarine without trans fats. Vegetable oil. Low-fat, reduced-fat, or light mayonnaise and salad dressings (reduced-sodium). Canola, safflower, olive, soybean, and sunflower oils. Avocado. Seasoning and other foods Herbs. Spices. Seasoning mixes without salt. Unsalted popcorn and pretzels. Fat-free sweets. What foods are not recommended? The items listed may not be a complete list. Talk with your dietitian about what dietary choices are best for you. Grains Baked goods made with fat, such as croissants, muffins, or some breads. Dry pasta or rice meal packs. Vegetables Creamed or fried vegetables. Vegetables in a cheese sauce. Regular canned vegetables (not low-sodium or reduced-sodium). Regular canned tomato sauce and paste (not low-sodium or reduced-sodium). Regular tomato and vegetable juice (not low-sodium or reduced-sodium). Pickles. Olives. Fruits Canned fruit in a light or heavy syrup. Fried fruit. Fruit in cream or butter sauce. Meat and other protein foods Fatty cuts of meat. Ribs. Fried meat. Bacon. Sausage. Bologna and other processed lunch meats. Salami. Fatback. Hotdogs. Bratwurst. Salted nuts and seeds. Canned beans with added salt. Canned or smoked fish. Whole eggs or egg yolks. Chicken or turkey with skin. Dairy Whole or 2% milk, cream, and half-and-half. Whole or full-fat cream cheese. Whole-fat or sweetened yogurt. Full-fat cheese. Nondairy creamers. Whipped toppings.  Processed cheese and cheese spreads. Fats and oils Butter. Stick margarine. Lard. Shortening. Ghee. Bacon fat. Tropical oils, such as coconut, palm kernel, or palm oil. Seasoning and other foods Salted popcorn and pretzels. Onion salt, garlic salt, seasoned salt, table salt, and sea salt. Worcestershire sauce. Tartar sauce. Barbecue sauce. Teriyaki sauce. Soy sauce, including reduced-sodium. Steak sauce. Canned and packaged gravies. Fish sauce. Oyster sauce. Cocktail sauce. Horseradish that you find on the shelf. Ketchup. Mustard. Meat flavorings and tenderizers. Bouillon cubes. Hot sauce and Tabasco sauce. Premade or packaged marinades. Premade or packaged taco seasonings. Relishes. Regular salad dressings. Where to find more information:  National Heart, Lung, and Blood Institute: www.nhlbi.nih.gov  American Heart Association: www.heart.org Summary  The DASH eating plan is a healthy eating plan that has been shown to reduce high blood pressure (hypertension). It may also reduce your risk for type 2 diabetes, heart disease, and stroke.  With the DASH eating plan, you should limit salt (sodium) intake to 2,300 mg a day. If you have hypertension, you may need to reduce your sodium intake to 1,500 mg a day.  When on the DASH eating plan, aim to eat more fresh fruits and vegetables, whole grains, lean proteins, low-fat dairy, and heart-healthy fats.  Work with your health care provider or diet and nutrition specialist (dietitian) to adjust your eating plan to your individual   calorie needs. This information is not intended to replace advice given to you by your health care provider. Make sure you discuss any questions you have with your health care provider. Document Released: 05/24/2011 Document Revised: 05/28/2016 Document Reviewed: 05/28/2016 Elsevier Interactive Patient Education  2018 [employer]  

## 2018-04-20 NOTE — Assessment & Plan Note (Signed)
Continue Sinemet Continue to assist with ADL's

## 2018-04-20 NOTE — Assessment & Plan Note (Signed)
Stable on Symbicort, Singulair and Flonase Will monitor

## 2018-04-20 NOTE — Assessment & Plan Note (Signed)
Continue Celexa Support offered today Wean not indicated

## 2018-04-24 ENCOUNTER — Encounter: Payer: Self-pay | Admitting: Internal Medicine

## 2018-04-24 DIAGNOSIS — I10 Essential (primary) hypertension: Secondary | ICD-10-CM | POA: Diagnosis not present

## 2018-04-24 DIAGNOSIS — G20 Parkinson's disease: Secondary | ICD-10-CM | POA: Diagnosis not present

## 2018-05-16 DIAGNOSIS — R05 Cough: Secondary | ICD-10-CM | POA: Diagnosis not present

## 2018-05-16 DIAGNOSIS — R0989 Other specified symptoms and signs involving the circulatory and respiratory systems: Secondary | ICD-10-CM | POA: Diagnosis not present

## 2018-06-15 DIAGNOSIS — L03116 Cellulitis of left lower limb: Secondary | ICD-10-CM | POA: Diagnosis not present

## 2018-06-15 DIAGNOSIS — M7989 Other specified soft tissue disorders: Secondary | ICD-10-CM | POA: Diagnosis not present

## 2018-06-27 ENCOUNTER — Ambulatory Visit: Payer: Medicare Other | Admitting: Internal Medicine

## 2018-06-27 ENCOUNTER — Encounter: Payer: Self-pay | Admitting: Internal Medicine

## 2018-06-27 VITALS — BP 122/68 | HR 68 | Temp 98.1°F | Resp 18 | Wt 170.2 lb

## 2018-06-27 DIAGNOSIS — R609 Edema, unspecified: Secondary | ICD-10-CM | POA: Diagnosis not present

## 2018-06-27 NOTE — Progress Notes (Signed)
Subjective:    Patient ID: Brandon Calhoun, male    DOB: 1930/03/26, 85 y.o.   MRN: [id number]  HPI  Asked to see resident in [address] C/o BLE edema, L>R Recently completed course of Keflex for possible cellulitis, some improvement noted. Mild redness LLE. No pain No cough, chest pain or shortness of breath. Not on any meds that could cause LE edema Sits a lot during the day but does walk with walker, down for meals Has tried some elevation  Review of Systems  Past Medical History:  Diagnosis Date  . Asthma   . Edema    MILD OF FEET  . GERD (gastroesophageal reflux disease)   . Hypertension   . Mood disorder (HCC)    chronic anxiety    Current Outpatient Medications  Medication Sig Dispense Refill  . albuterol (PROVENTIL HFA;VENTOLIN HFA) 108 (90 Base) MCG/ACT inhaler Inhale 2 puffs into the lungs 3 (three) times daily as needed for wheezing or shortness of breath.    . budesonide-formoterol (SYMBICORT) 160-4.5 MCG/ACT inhaler Inhale 2 puffs into the lungs 2 (two) times daily.    . carbidopa-levodopa (SINEMET IR) 25-100 MG tablet Take 1 tablet by mouth 3 (three) times daily. 90 tablet 11  . citalopram (CELEXA) 10 MG tablet Take 10 mg by mouth daily.    . fluticasone (FLONASE) 50 MCG/ACT nasal spray Place 2 sprays into the nose daily.    . montelukast (SINGULAIR) 10 MG tablet Take 1 tablet by mouth daily.    . montelukast (SINGULAIR) 10 MG tablet Take 10 mg by mouth at bedtime.    Marland Kitchen. omeprazole (PRILOSEC) 20 MG capsule Take 1 capsule by mouth daily.    . tamsulosin (FLOMAX) 0.4 MG CAPS capsule Take 0.4 mg by mouth.     No current facility-administered medications for this visit.     No Known Allergies  Family History  Problem Relation Age of Onset  . COPD Brother   . Hypertension Neg Hx   . Diabetes Neg Hx   . Heart disease Neg Hx     Social History   Socioeconomic History  . Marital status: Widowed    Spouse name: Not on file  . Number of children: Not on  file  . Years of education: Not on file  . Highest education level: Not on file  Occupational History  . 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 Financial resource strain: Not on file  . Food insecurity:    Worry: Not on file    Inability: Not on file  . Transportation needs:    Medical: Not on file    Non-medical: Not on file  Tobacco Use  . Smoking status: Never Smoker  . Smokeless tobacco: Never Used  Substance and Sexual Activity  . Alcohol use: No  . Drug use: No  . Sexual activity: Not on file  Lifestyle  . Physical activity:    Days per week: Not on file    Minutes per session: Not on file  . Stress: Not on file  Relationships  . Social connections:    Talks on phone: Not on file    Gets together: Not on file    Attends religious service: Not on file    Active member of club or organization: Not on file    Attends meetings of clubs or organizations: Not on file    Relationship status: Not on file  . Intimate partner violence:  Fear of current or ex partner: Not on file    Emotionally abused: Not on file    Physically abused: Not on file    Forced sexual activity: Not on file  Other Topics Concern  . Not on file  Social History Narrative   Widowed 2003. 1 daughter      Has living will   Daughter Darl Pikes is health care POA   Would accept resuscitation attempts   No tube feeds if cognitively unaware     Constitutional: Denies fever, malaise, fatigue, headache or abrupt weight changes.  Respiratory: Denies difficulty breathing, shortness of breath, cough or sputum production.   Cardiovascular: Pt reports BLE edema, L>R. Denies chest pain, chest tightness, palpitations or swelling in the hands.  Musculoskeletal: Denies decrease in range of motion, difficulty with gait, muscle pain or joint pain.  Skin: Pt reports redness, LLE. Denies rashes, lesions or ulcercations.    No other specific complaints in a complete  review of systems (except as listed in HPI above).     Objective:   Physical Exam  BP 122/68   Pulse 68   Temp 98.1 F (36.7 C)   Resp 18   Wt 170 lb 3.2 oz (77.2 kg)   BMI 25.13 kg/m  Wt Readings from Last 3 Encounters:  06/27/18 170 lb 3.2 oz (77.2 kg)  04/20/18 162 lb 9.6 oz (73.8 kg)  01/16/18 162 lb (73.5 kg)    General: Appears his stated age, in NAD. Skin: Warm, dry and intact. Mild redness of LLE but no warmth, nontender with palpation. Neck:  No JVD. Cardiovascular: Normal rate and rhythm. S1,S2 noted.  No murmur, rubs or gallops noted. 1+ pitting RLE edema noted. 2+ pitting LLE edema noted. Negative Homan's bilaterally. Pulmonary/Chest: Normal effort and positive vesicular breath sounds. No respiratory distress. No wheezes, rales or ronchi noted.  Musculoskeletal: Normal flexion and extension of bilateral knees. Joint enlargement of b/l knees noted without swelling. No pain with palpation of knees. Gait slow and steady with use of walker. Neurological: Alert and oriented.    BMET    Component Value Date/Time   NA 137 08/20/2016 1145   K 3.7 08/20/2016 1145   CL 106 08/20/2016 1145   CO2 24 08/20/2016 1145   GLUCOSE 113 (H) 08/20/2016 1145   BUN 20 08/20/2016 1145   CREATININE 0.72 08/20/2016 1145   CALCIUM 8.6 (L) 08/20/2016 1145   GFRNONAA >60 08/20/2016 1145   GFRAA >60 08/20/2016 1145    Lipid Panel  No results found for: CHOL, TRIG, HDL, CHOLHDL, VLDL, LDLCALC  CBC    Component Value Date/Time   WBC 10.9 (H) 08/20/2016 1145   RBC 3.97 (L) 08/20/2016 1145   HGB 12.4 (L) 08/20/2016 1145   HCT 35.9 (L) 08/20/2016 1145   PLT 346 08/20/2016 1145   MCV 90.4 08/20/2016 1145   MCH 31.2 08/20/2016 1145   MCHC 34.5 08/20/2016 1145   RDW 14.9 (H) 08/20/2016 1145   LYMPHSABS 1.8 08/20/2016 1145   MONOABS 0.7 08/20/2016 1145   EOSABS 0.1 08/20/2016 1145   BASOSABS 0.0 08/20/2016 1145    Hgb A1C No results found for: HGBA1C          Assessment  & Plan:   Peripheral Edema, L>R:  Will obtain venous ultrasound LLE to r/o DVT If negative, will treat with diuretics (kidney function normal) Encouraged elevations Consider TED hose if swelling persists  Will reassess after ultrasound results available Nicki Reaper, NP

## 2018-06-27 NOTE — Patient Instructions (Signed)

## 2018-06-28 DIAGNOSIS — R600 Localized edema: Secondary | ICD-10-CM | POA: Diagnosis not present

## 2018-07-03 ENCOUNTER — Ambulatory Visit: Payer: Medicare Other | Admitting: Internal Medicine

## 2018-07-03 ENCOUNTER — Encounter: Payer: Self-pay | Admitting: Internal Medicine

## 2018-07-03 VITALS — BP 120/68 | HR 70 | Temp 98.4°F | Resp 18 | Wt 176.8 lb

## 2018-07-03 DIAGNOSIS — I872 Venous insufficiency (chronic) (peripheral): Secondary | ICD-10-CM | POA: Insufficient documentation

## 2018-07-03 DIAGNOSIS — F39 Unspecified mood [affective] disorder: Secondary | ICD-10-CM

## 2018-07-03 DIAGNOSIS — I10 Essential (primary) hypertension: Secondary | ICD-10-CM

## 2018-07-03 DIAGNOSIS — J4520 Mild intermittent asthma, uncomplicated: Secondary | ICD-10-CM

## 2018-07-03 DIAGNOSIS — G20 Parkinson's disease: Secondary | ICD-10-CM | POA: Diagnosis not present

## 2018-07-03 NOTE — Assessment & Plan Note (Signed)
Initially more on left but now bilateral There is enough fluid to account for the weight gain I am concerned by the pattern that there could be some venous obstruction in abdomen or chest Will try higher lasix dose---if persists, would consider abdominal ultrasound (or CT) for further evaluation

## 2018-07-03 NOTE — Assessment & Plan Note (Signed)
BP Readings from Last 3 Encounters:  07/03/18 120/68  06/27/18 122/68  04/20/18 126/74   Good control

## 2018-07-03 NOTE — Progress Notes (Signed)
Subjective:    Patient ID: Brandon Calhoun, male    DOB: 19-Feb-1930, 85 y.o.   MRN: [id number]  HPI Visit in his [place] apartment for review of his chronic health conditions Reviewed status with Magda PaganiniAudrey RN and reviewed recent evaluation and negative doppler for DVT in left leg  Has ongoing left leg swelling [facility]antibiotic in case cellulitis---no help Didn't respond to furosemide 20mg --after negative study Left upper calf is "sensitive to touch" No chest pain No SOB No dizziness or syncope Doesn't salt food regularly  Still on sinemet  Parkinson's is stable Has AM aide---mostly due to slowness in care Independent   Mood okay Still gets anxiety at times No depression Does many of the activities, etc---but has been slowed by the edema  No wheezing or cough Breathing stable on inhalers  Current Outpatient Medications on File Prior to Visit  Medication Sig Dispense Refill  . albuterol (PROVENTIL HFA;VENTOLIN HFA) 108 (90 Base) MCG/ACT inhaler Inhale 2 puffs into the lungs 3 (three) times daily as needed for wheezing or shortness of breath.    . budesonide-formoterol (SYMBICORT) 160-4.5 MCG/ACT inhaler Inhale 2 puffs into the lungs 2 (two) times daily.    . carbidopa-levodopa (SINEMET IR) 25-100 MG tablet Take 1 tablet by mouth 3 (three) times daily. 90 tablet 11  . citalopram (CELEXA) 10 MG tablet Take 10 mg by mouth daily.    . fluticasone (FLONASE) 50 MCG/ACT nasal spray Place 2 sprays into the nose daily.    . montelukast (SINGULAIR) 10 MG tablet Take 10 mg by mouth at bedtime.    Marland Kitchen. omeprazole (PRILOSEC) 20 MG capsule Take 1 capsule by mouth daily.    . tamsulosin (FLOMAX) 0.4 MG CAPS capsule Take 0.4 mg by mouth.     No current facility-administered medications on file prior to visit.     No Known Allergies  Past Medical History:  Diagnosis Date  . Asthma   . Edema    MILD OF FEET  . GERD (gastroesophageal reflux disease)   . Hypertension    . Mood disorder (HCC)    chronic anxiety    Past Surgical History:  Procedure Laterality Date  . CATARACT EXTRACTION W/PHACO Left 02/16/2016   Procedure: CATARACT EXTRACTION PHACO AND INTRAOCULAR LENS PLACEMENT (IOC);  Surgeon: Nevada CraneBradley Mark King, MD;  Location: [facility];  Service: Ophthalmology;  Laterality: Left;  US 1.10 AP% 10.6 CDE 7.41 Fluid Pack Lot # [id number]  . CATARACT EXTRACTION W/PHACO Right 03/08/2016   Procedure: CATARACT EXTRACTION PHACO AND INTRAOCULAR LENS PLACEMENT (IOC);  Surgeon: Nevada CraneBradley Mark King, MD;  Location: [facility];  Service: Ophthalmology;  Laterality: Right;  US 1.14 AP% 11.6 CDE 11.80 Fluid Pack Lot # [id number]  . HERNIA REPAIR    . HIP ARTHROPLASTY Left 07/06/2016   Procedure: ARTHROPLASTY BIPOLAR HIP (HEMIARTHROPLASTY);  Surgeon: Kennedy BuckerMichael Menz, MD;  Location: [facility];  Service: Orthopedics;  Laterality: Left;    Family History  Problem Relation Age of Onset  . COPD Brother   . Hypertension Neg Hx   . Diabetes Neg Hx   . Heart disease Neg Hx     Social History   Socioeconomic History  . Marital status: Widowed    Spouse name: Not on file  . Number of children: Not on file  . Years of education: Not on file  . Highest education level: Not on file  Occupational History  . 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  Financial resource strain: Not on file  . Food insecurity:    Worry: Not on file    Inability: Not on file  . Transportation needs:    Medical: Not on file    Non-medical: Not on file  Tobacco Use  . Smoking status: Never Smoker  . Smokeless tobacco: Never Used  Substance and Sexual Activity  . Alcohol use: No  . Drug use: No  . Sexual activity: Not on file  Lifestyle  . Physical activity:    Days per week: Not on file    Minutes per session: Not on file  . Stress: Not on file  Relationships  . Social connections:    Talks on phone: Not on file    Gets together: Not on file     Attends religious service: Not on file    Active member of club or organization: Not on file    Attends meetings of clubs or organizations: Not on file    Relationship status: Not on file  . Intimate partner violence:    Fear of current or ex partner: Not on file    Emotionally abused: Not on file    Physically abused: Not on file    Forced sexual activity: Not on file  Other Topics Concern  . Not on file  Social History Narrative   Widowed 2003. 1 daughter      Has living will   Daughter Darl Pikes is health care POA   Would accept resuscitation attempts   No tube feeds if cognitively unaware   Review of Systems Appetite is fine Weight is up ~12#---likely fluid Some loose stools---?secondary to the antibiotic Three per day--no blood Occasional blisters on fingers----lotion will help Voids okay. Slow stream--does empty okay     Objective:   Physical Exam  Constitutional: He appears well-developed. No distress.  Neck: No thyromegaly present.  Cardiovascular: Normal rate, regular rhythm and normal heart sounds. Exam reveals no gallop.  No murmur heard. Respiratory: Effort normal and breath sounds normal. No respiratory distress. He has no wheezes. He has no rales.  GI: Soft. There is no abdominal tenderness.  Musculoskeletal:     Comments: Reedy edema in thighs (bilateral) and 1-2+ edema in calves. Left on slightly worse than right  Lymphadenopathy:    He has no cervical adenopathy.  Neurological:  Usual bradykinesia  Skin:  Mild stasis changes in calf--no true inflammation  Psychiatric: He has a normal mood and affect. His behavior is normal.           Assessment & Plan:

## 2018-07-03 NOTE — Assessment & Plan Note (Signed)
Stable functional status on the sinemet 

## 2018-07-03 NOTE — Assessment & Plan Note (Signed)
Chronic anxiety No sig depression On the citalopram and it appears to keep him stable

## 2018-07-03 NOTE — Assessment & Plan Note (Signed)
No sig symptoms on his regimen

## 2018-07-07 ENCOUNTER — Telehealth: Payer: Self-pay

## 2018-07-07 NOTE — Telephone Encounter (Signed)
I agree with all she is saying---as I saw him last week and was concerned. If he doesn't respond to the diuretic, I will have to check abdominal imaging (like an ultrasound) to see if there is some blockage  Please check with Audrey--I told her I was going to do this if he didn't respond

## 2018-07-07 NOTE — Telephone Encounter (Signed)
Darl Pikes pts daughter who said she has medical POA said pt is at [facility]; since 07/03/18 pt has been taking Lasix 40 mg daily and leg swelling is no better; swelling does not go down overnight. Darl Pikes said pt is not voiding more since on Lasix 40 mg. Darl Pikes is concerned about pts renal function. Rt leg is slightly more swollen than left pt has gained 12 lbs in last 4 wks. Legs are swollen 1 1/2 times more than usual. Shin to ankle is red, no red streaks, no fever. No CP or SOB. Pt is not having pain but does not feel comfortable. Darl Pikes request cb after Dr Alphonsus Sias reviews note.

## 2018-07-07 NOTE — Telephone Encounter (Signed)
Left detailed message for Pt's daughter.  Called and spoke to [place] at [facility] as Magda Paganini was out. She will let Magda Paganini know.

## 2018-07-07 NOTE — Telephone Encounter (Signed)
Please call Brandon Calhoun in the morning Have her order abdominal ultrasound to evaluate for vascular obstruction  I believe I ordered labs to be done (in answer to daughter's question)--but you can check on this with Brandon Calhoun

## 2018-07-07 NOTE — Telephone Encounter (Signed)
Darl Pikes got shannon's v/m and voiced understanding. Darl Pikes wants to know in addition to ordering abd imaging if Dr Alphonsus Sias will order CMP and CBC due to her concern about possible renal problems.  Pt is having more swelling in abd but no pain or SOB. Darl Pikes request cb.

## 2018-07-08 ENCOUNTER — Other Ambulatory Visit: Payer: Self-pay | Admitting: Internal Medicine

## 2018-07-08 ENCOUNTER — Other Ambulatory Visit (HOSPITAL_COMMUNITY): Payer: Self-pay | Admitting: Internal Medicine

## 2018-07-08 DIAGNOSIS — R635 Abnormal weight gain: Secondary | ICD-10-CM

## 2018-07-08 NOTE — Telephone Encounter (Signed)
Spoke to [place]. She said the U/S is scheduled for tomorrow at 8:30 and the blood work will be done Thursday morning.

## 2018-07-09 ENCOUNTER — Other Ambulatory Visit: Payer: Self-pay | Admitting: Internal Medicine

## 2018-07-09 ENCOUNTER — Ambulatory Visit
Admission: RE | Admit: 2018-07-09 | Discharge: 2018-07-09 | Disposition: A | Discharge status: Home / Self Care | Payer: Medicare Other | Source: Ambulatory Visit | Attending: Internal Medicine | Admitting: Internal Medicine

## 2018-07-09 DIAGNOSIS — R635 Abnormal weight gain: Secondary | ICD-10-CM

## 2018-07-09 DIAGNOSIS — K760 Fatty (change of) liver, not elsewhere classified: Secondary | ICD-10-CM | POA: Diagnosis not present

## 2018-07-10 DIAGNOSIS — R601 Generalized edema: Secondary | ICD-10-CM | POA: Diagnosis not present

## 2018-07-10 DIAGNOSIS — I872 Venous insufficiency (chronic) (peripheral): Secondary | ICD-10-CM | POA: Diagnosis not present

## 2018-07-16 ENCOUNTER — Ambulatory Visit (INDEPENDENT_AMBULATORY_CARE_PROVIDER_SITE_OTHER): Payer: Medicare Other | Admitting: Internal Medicine

## 2018-07-16 VITALS — BP 116/56 | HR 75 | Temp 98.3°F | Resp 18 | Wt 180.2 lb

## 2018-07-16 DIAGNOSIS — M79675 Pain in left toe(s): Secondary | ICD-10-CM

## 2018-07-16 DIAGNOSIS — B351 Tinea unguium: Secondary | ICD-10-CM

## 2018-07-16 DIAGNOSIS — M79674 Pain in right toe(s): Secondary | ICD-10-CM | POA: Diagnosis not present

## 2018-07-17 DIAGNOSIS — I872 Venous insufficiency (chronic) (peripheral): Secondary | ICD-10-CM | POA: Diagnosis not present

## 2018-07-17 DIAGNOSIS — R601 Generalized edema: Secondary | ICD-10-CM | POA: Diagnosis not present

## 2018-07-20 ENCOUNTER — Encounter: Payer: Self-pay | Admitting: Internal Medicine

## 2018-07-20 NOTE — Patient Instructions (Signed)
Fungal Nail Infection A fungal nail infection is a common infection of the toenails or fingernails. This condition affects toenails more often than fingernails. It often affects the great, or big, toes. More than one nail may be infected. The condition can be passed from person to person (is contagious). What are the causes? This condition is caused by a fungus. Several types of fungi can cause the infection. These fungi are common in moist and warm areas. If your hands or feet come into contact with the fungus, it may get into a crack in your fingernail or toenail and cause the infection. What increases the risk? The following factors may make you more likely to develop this condition:  Being male.  Being of older age.  Living with someone who has the fungus.  Walking barefoot in areas where the fungus thrives, such as showers or locker rooms.  Wearing shoes and socks that cause your feet to sweat.  Having a nail injury or a recent nail surgery.  Having certain medical conditions, such as: ? Athlete's foot. ? Diabetes. ? Psoriasis. ? Poor circulation. ? A weak body defense system (immune system). What are the signs or symptoms? Symptoms of this condition include:  A pale spot on the nail.  Thickening of the nail.  A nail that becomes yellow or brown.  A brittle or ragged nail edge.  A crumbling nail.  A nail that has lifted away from the nail bed. How is this diagnosed? This condition is diagnosed with a physical exam. Your health care provider may take a scraping or clipping from your nail to test for the fungus. How is this treated? Treatment is not needed for mild infections. If you have significant nail changes, treatment may include:  Antifungal medicines taken by mouth (orally). You may need to take the medicine for several weeks or several months, and you may not see the results for a long time. These medicines can cause side effects. Ask your health care provider  what problems to watch for.  Antifungal nail polish or nail cream. These may be used along with oral antifungal medicines.  Laser treatment of the nail.  Surgery to remove the nail. This may be needed for the most severe infections. It can take a long time, usually up to a year, for the infection to go away. The infection may also come back. Follow these instructions at home: Medicines  Take or apply over-the-counter and prescription medicines only as told by your health care provider.  Ask your health care provider about using over-the-counter mentholated ointment on your nails. Nail care  Trim your nails often.  Wash and dry your hands and feet every day.  Keep your feet dry: ? Wear absorbent socks, and change your socks frequently. ? Wear shoes that allow air to circulate, such as sandals or canvas tennis shoes. Throw out old shoes.  Do not use artificial nails.  If you go to a nail salon, make sure you choose one that uses clean instruments.  Use antifungal foot powder on your feet and in your shoes. General instructions  Do not share personal items, such as towels or nail clippers.  Do not walk barefoot in shower rooms or locker rooms.  Wear rubber gloves if you are working with your hands in wet areas.  Keep all follow-up visits as told by your health care provider. This is important. Contact a health care provider if: Your infection is not getting better or it is getting worse   after several months. Summary  A fungal nail infection is a common infection of the toenails or fingernails.  Treatment is not needed for mild infections. If you have significant nail changes, treatment may include taking medicine orally and applying medicine to your nails.  It can take a long time, usually up to a year, for the infection to go away. The infection may also come back.  Take or apply over-the-counter and prescription medicines only as told by your health care  provider.  Follow instructions for taking care of your nails to help prevent infection from coming back or spreading. This information is not intended to replace advice given to you by your health care provider. Make sure you discuss any questions you have with your health care provider. Document Released: 06/01/2000 Document Revised: 11/08/2017 Document Reviewed: 11/08/2017 Elsevier Interactive Patient Education  2019 Elsevier Inc.  

## 2018-07-20 NOTE — Progress Notes (Signed)
Subjective:    Patient ID: Brandon Calhoun, male    DOB: 03-14-1930, 85 y.o.   MRN: [id number]  HPI  Asked to see resident in [address] Requesting nail trim. Nails thick long, getting caught on socks causing pain.  Review of Systems    .  Past Medical History:  Diagnosis Date  . Asthma   . Edema    MILD OF FEET  . GERD (gastroesophageal reflux disease)   . Hypertension   . Mood disorder (HCC)    chronic anxiety    Current Outpatient Medications  Medication Sig Dispense Refill  . albuterol (PROVENTIL HFA;VENTOLIN HFA) 108 (90 Base) MCG/ACT inhaler Inhale 2 puffs into the lungs 3 (three) times daily as needed for wheezing or shortness of breath.    . budesonide-formoterol (SYMBICORT) 160-4.5 MCG/ACT inhaler Inhale 2 puffs into the lungs 2 (two) times daily.    . carbidopa-levodopa (SINEMET IR) 25-100 MG tablet Take 1 tablet by mouth 3 (three) times daily. 90 tablet 11  . citalopram (CELEXA) 10 MG tablet Take 10 mg by mouth daily.    . fluticasone (FLONASE) 50 MCG/ACT nasal spray Place 2 sprays into the nose daily.    . montelukast (SINGULAIR) 10 MG tablet Take 10 mg by mouth at bedtime.    Marland Kitchen. omeprazole (PRILOSEC) 20 MG capsule Take 1 capsule by mouth daily.    . tamsulosin (FLOMAX) 0.4 MG CAPS capsule Take 0.4 mg by mouth.     No current facility-administered medications for this visit.     No Known Allergies  Family History  Problem Relation Age of Onset  . COPD Brother   . Hypertension Neg Hx   . Diabetes Neg Hx   . Heart disease Neg Hx     Social History   Socioeconomic History  . Marital status: Widowed    Spouse name: Not on file  . Number of children: Not on file  . Years of education: Not on file  . Highest education level: Not on file  Occupational History  . 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 Financial resource strain: Not on file  . Food insecurity:    Worry: Not on file    Inability: Not on  file  . Transportation needs:    Medical: Not on file    Non-medical: Not on file  Tobacco Use  . Smoking status: Never Smoker  . Smokeless tobacco: Never Used  Substance and Sexual Activity  . Alcohol use: No  . Drug use: No  . Sexual activity: Not on file  Lifestyle  . Physical activity:    Days per week: Not on file    Minutes per session: Not on file  . Stress: Not on file  Relationships  . Social connections:    Talks on phone: Not on file    Gets together: Not on file    Attends religious service: Not on file    Active member of club or organization: Not on file    Attends meetings of clubs or organizations: Not on file    Relationship status: Not on file  . Intimate partner violence:    Fear of current or ex partner: Not on file    Emotionally abused: Not on file    Physically abused: Not on file    Forced sexual activity: Not on file  Other Topics Concern  . Not on file  Social History Narrative   Widowed 2003. 1 daughter  Has living will   Daughter Brandon Calhoun is health care POA   Would accept resuscitation attempts   No tube feeds if cognitively unaware     Constitutional: Denies fever, malaise, fatigue, headache or abrupt weight changes.  Skin: Pt reports long, thick, discolored nails.    No other specific complaints in a complete review of systems (except as listed in HPI above).  Objective:   Physical Exam  BP (!) 116/56   Pulse 75   Temp 98.3 F (36.8 C)   Resp 18   Wt 180 lb 3.2 oz (81.7 kg)   BMI 26.61 kg/m  Wt Readings from Last 3 Encounters:  07/20/18 180 lb 3.2 oz (81.7 kg)  07/03/18 176 lb 12.8 oz (80.2 kg)  06/27/18 170 lb 3.2 oz (77.2 kg)    General: Appears his stated age, well developed, well nourished in NAD. Skin: Mycotic toenails bilaterally.  BMET    Component Value Date/Time   NA 137 08/20/2016 1145   K 3.7 08/20/2016 1145   CL 106 08/20/2016 1145   CO2 24 08/20/2016 1145   GLUCOSE 113 (H) 08/20/2016 1145   BUN 20  08/20/2016 1145   CREATININE 0.72 08/20/2016 1145   CALCIUM 8.6 (L) 08/20/2016 1145   GFRNONAA >60 08/20/2016 1145   GFRAA >60 08/20/2016 1145    Lipid Panel  No results found for: CHOL, TRIG, HDL, CHOLHDL, VLDL, LDLCALC  CBC    Component Value Date/Time   WBC 10.9 (H) 08/20/2016 1145   RBC 3.97 (L) 08/20/2016 1145   HGB 12.4 (L) 08/20/2016 1145   HCT 35.9 (L) 08/20/2016 1145   PLT 346 08/20/2016 1145   MCV 90.4 08/20/2016 1145   MCH 31.2 08/20/2016 1145   MCHC 34.5 08/20/2016 1145   RDW 14.9 (H) 08/20/2016 1145   LYMPHSABS 1.8 08/20/2016 1145   MONOABS 0.7 08/20/2016 1145   EOSABS 0.1 08/20/2016 1145   BASOSABS 0.0 08/20/2016 1145    Hgb A1C No results found for: HGBA1C          Assessment & Plan:   Pain due to Mycotic Toenails:  Nails 1-5 b/l feet trimmed by provider using dremmel tool Pt tolerated well No complications  Return precautions discussed Nicki Reaperegina Tyaira Heward, NP

## 2018-10-10 ENCOUNTER — Encounter: Payer: Self-pay | Admitting: Internal Medicine

## 2018-10-10 ENCOUNTER — Ambulatory Visit: Payer: Medicare Other | Admitting: Internal Medicine

## 2018-10-10 DIAGNOSIS — K219 Gastro-esophageal reflux disease without esophagitis: Secondary | ICD-10-CM

## 2018-10-10 DIAGNOSIS — I10 Essential (primary) hypertension: Secondary | ICD-10-CM

## 2018-10-10 DIAGNOSIS — F39 Unspecified mood [affective] disorder: Secondary | ICD-10-CM

## 2018-10-10 DIAGNOSIS — I872 Venous insufficiency (chronic) (peripheral): Secondary | ICD-10-CM

## 2018-10-10 DIAGNOSIS — N401 Enlarged prostate with lower urinary tract symptoms: Secondary | ICD-10-CM

## 2018-10-10 DIAGNOSIS — N138 Other obstructive and reflux uropathy: Secondary | ICD-10-CM

## 2018-10-10 DIAGNOSIS — J4530 Mild persistent asthma, uncomplicated: Secondary | ICD-10-CM | POA: Diagnosis not present

## 2018-10-10 DIAGNOSIS — G20 Parkinson's disease: Secondary | ICD-10-CM

## 2018-10-10 NOTE — Assessment & Plan Note (Signed)
Will cut Lasix to 20 mg daily Continue Spironolactone If no increase in edema, will d/c Furosemide and Potassium

## 2018-10-10 NOTE — Assessment & Plan Note (Signed)
Appreciate ALF care Continue Sinemet

## 2018-10-10 NOTE — Assessment & Plan Note (Signed)
Continue Symbicort and Albuterol Will monitor 

## 2018-10-10 NOTE — Assessment & Plan Note (Addendum)
Will cut Furosemide to 20 mg daily Continue Potassium, Spironolactone Encouraged regular physical activity If no increase in edema, will try to d/c Furosemide and Potassium

## 2018-10-10 NOTE — Assessment & Plan Note (Signed)
-   Continue Flomax 

## 2018-10-10 NOTE — Assessment & Plan Note (Signed)
Will cut Omeprazole to 20 mg daily Discussed avoiding foods that trigger reflux Will monitor

## 2018-10-10 NOTE — Progress Notes (Signed)
Subjective:    Patient ID: Brandon Calhoun, male    DOB: 07-17-1929, 85 y.o.   MRN: [id number]  HPI  Saw resident in [address] for routine follow up Resident reports some redness to left shin. Not painful. Doesn't appear warm. Otherwise doing about the same.  Sleep interrupted by nocturia, but usually able to fall back asleep easy. He is independent with AD:'s. Walks with walker in room, rollater for longer distances Appetite good  Mood Disorder: Mild anxiety. He feels like the Citalopram is working well for him. He denies SI/HI.  HTN: Well controlled. He is taking Sprionolactone, Lasix and Potassium. He would like to know if he can come off of some of these diuretics.  GERD: He denies breakthrough on Omeprazole. He denies difficulty swallowing or choking sensation.  Asthma: Mild, persistent. Controlled on Symbicort. Rare Albuterol use.  BPH: C/o mainly of nocturia. He is taking Flomax as prescribed.   Parkinson's: He is not currently going to boxing due to the pandemic. He has some bradykinsea but no real resting tremor. He takes Sinemet as prescribed with some relief.     Review of Systems      Past Medical History:  Diagnosis Date  . Asthma   . Edema    MILD OF FEET  . GERD (gastroesophageal reflux disease)   . Hypertension   . Mood disorder (HCC)    chronic anxiety    Current Outpatient Medications  Medication Sig Dispense Refill  . albuterol (PROVENTIL HFA;VENTOLIN HFA) 108 (90 Base) MCG/ACT inhaler Inhale 2 puffs into the lungs 3 (three) times daily as needed for wheezing or shortness of breath.    . budesonide-formoterol (SYMBICORT) 160-4.5 MCG/ACT inhaler Inhale 2 puffs into the lungs 2 (two) times daily.    . carbidopa-levodopa (SINEMET IR) 25-100 MG tablet Take 1 tablet by mouth 3 (three) times daily. 90 tablet 11  . citalopram (CELEXA) 10 MG tablet Take 10 mg by mouth daily.    . fluticasone (FLONASE) 50 MCG/ACT nasal spray Place 2 sprays into the nose  daily.    . furosemide (LASIX) 20 MG tablet 40 mg daily.    . montelukast (SINGULAIR) 10 MG tablet Take 10 mg by mouth at bedtime.    Marland Kitchen omeprazole (PRILOSEC) 20 MG capsule Take 1 capsule by mouth daily.    . potassium chloride SA (K-DUR) 20 MEQ tablet 20 mEq 2 (two) times daily.    Marland Kitchen spironolactone (ALDACTONE) 25 MG tablet 25 mg daily.    . tamsulosin (FLOMAX) 0.4 MG CAPS capsule Take 0.4 mg by mouth.     No current facility-administered medications for this visit.     No Known Allergies  Family History  Problem Relation Age of Onset  . COPD Brother   . Hypertension Neg Hx   . Diabetes Neg Hx   . Heart disease Neg Hx     Social History   Socioeconomic History  . Marital status: Widowed    Spouse name: Not on file  . Number of children: Not on file  . Years of education: Not on file  . Highest education level: Not on file  Occupational History  . Occupation: Therapist, nutritional    Comment: Retired  Engineer, production  . Financial resource strain: Not on file  . Food insecurity:    Worry: Not on file    Inability: Not on file  . Transportation needs:    Medical: Not on file    Non-medical: Not on file  Tobacco Use  . Smoking status: Never Smoker  . Smokeless tobacco: Never Used  Substance and Sexual Activity  . Alcohol use: No  . Drug use: No  . Sexual activity: Not on file  Lifestyle  . Physical activity:    Days per week: Not on file    Minutes per session: Not on file  . Stress: Not on file  Relationships  . Social connections:    Talks on phone: Not on file    Gets together: Not on file    Attends religious service: Not on file    Active member of club or organization: Not on file    Attends meetings of clubs or organizations: Not on file    Relationship status: Not on file  . Intimate partner violence:    Fear of current or ex partner: Not on file    Emotionally abused: Not on file    Physically abused: Not on file    Forced sexual  activity: Not on file  Other Topics Concern  . Not on file  Social History Narrative   Widowed 2003. 1 daughter      Has living will   Daughter Darl Pikes is health care POA   Would accept resuscitation attempts   No tube feeds if cognitively unaware     Constitutional: Denies fever, malaise, fatigue, headache or abrupt weight changes.  HEENT: Denies eye pain, eye redness, ear pain, ringing in the ears, wax buildup, runny nose, nasal congestion, bloody nose, or sore throat. Respiratory: Denies difficulty breathing, shortness of breath, cough or sputum production.   Cardiovascular: Denies chest pain, chest tightness, palpitations or swelling in the hands or feet.  Gastrointestinal: Denies abdominal pain, bloating, constipation, diarrhea or blood in the stool.  GU: Pt reports nocturia. Denies urgency, frequency, pain with urination, burning sensation, blood in urine, odor or discharge. Musculoskeletal: Pt reports difficulty with gait. Denies decrease in range of motion, muscle pain or joint pain and swelling.  Skin: Denies redness, rashes, lesions or ulcercations.  Neurological: Denies dizziness, difficulty with memory, difficulty with speech or problems with balance and coordination.  Psych: Pt has a history of anxiety. Denies depression, SI/HI.  No other specific complaints in a complete review of systems (except as listed in HPI above).  Objective:   Physical Exam   BP 119/63   Pulse 71   Temp 98.3 F (36.8 C)   Resp 17   Wt 166 lb 9.6 oz (75.6 kg)   BMI 24.60 kg/m  Wt Readings from Last 3 Encounters:  10/10/18 166 lb 9.6 oz (75.6 kg)  07/20/18 180 lb 3.2 oz (81.7 kg)  07/03/18 176 lb 12.8 oz (80.2 kg)    General: Appears his stated age, in NAD. Skin: Warm, dry and intact. Cardiovascular: Normal rate and rhythm. S1,S2 noted.  No murmur, rubs or gallops noted. No JVD or BLE edema.  Pulmonary/Chest: Normal effort and positive vesicular breath sounds. No respiratory distress.  No wheezes, rales or ronchi noted.  Abdomen: Soft and nontender.  Neurological: Alert and oriented.  Psychiatric: Mood and affect normal. Behavior is normal. Judgment and thought content normal.     BMET    Component Value Date/Time   NA 137 08/20/2016 1145   K 3.7 08/20/2016 1145   CL 106 08/20/2016 1145   CO2 24 08/20/2016 1145   GLUCOSE 113 (H) 08/20/2016 1145   BUN 20 08/20/2016 1145   CREATININE 0.72 08/20/2016 1145   CALCIUM 8.6 (L) 08/20/2016 1145  GFRNONAA >60 08/20/2016 1145   GFRAA >60 08/20/2016 1145    Lipid Panel  No results found for: CHOL, TRIG, HDL, CHOLHDL, VLDL, LDLCALC  CBC    Component Value Date/Time   WBC 10.9 (H) 08/20/2016 1145   RBC 3.97 (L) 08/20/2016 1145   HGB 12.4 (L) 08/20/2016 1145   HCT 35.9 (L) 08/20/2016 1145   PLT 346 08/20/2016 1145   MCV 90.4 08/20/2016 1145   MCH 31.2 08/20/2016 1145   MCHC 34.5 08/20/2016 1145   RDW 14.9 (H) 08/20/2016 1145   LYMPHSABS 1.8 08/20/2016 1145   MONOABS 0.7 08/20/2016 1145   EOSABS 0.1 08/20/2016 1145   BASOSABS 0.0 08/20/2016 1145    Hgb A1C No results found for: HGBA1C        Assessment & Plan:

## 2018-10-10 NOTE — Assessment & Plan Note (Signed)
Continue Citalopram Support offered today Wean not indicated at this time Will monitor

## 2018-10-10 NOTE — Patient Instructions (Signed)

## 2018-10-23 DIAGNOSIS — I10 Essential (primary) hypertension: Secondary | ICD-10-CM | POA: Diagnosis not present

## 2018-10-23 DIAGNOSIS — K219 Gastro-esophageal reflux disease without esophagitis: Secondary | ICD-10-CM | POA: Diagnosis not present

## 2018-10-23 DIAGNOSIS — J45909 Unspecified asthma, uncomplicated: Secondary | ICD-10-CM | POA: Diagnosis not present

## 2018-12-24 ENCOUNTER — Encounter: Payer: Self-pay | Admitting: Internal Medicine

## 2018-12-24 ENCOUNTER — Ambulatory Visit: Payer: Medicare Other | Admitting: Internal Medicine

## 2018-12-24 VITALS — BP 116/62 | HR 84 | Temp 97.7°F | Resp 20 | Wt 165.2 lb

## 2018-12-24 DIAGNOSIS — L989 Disorder of the skin and subcutaneous tissue, unspecified: Secondary | ICD-10-CM | POA: Diagnosis not present

## 2018-12-24 NOTE — Progress Notes (Signed)
Subjective:    Patient ID: Brandon Calhoun, male    DOB: Jun 25, 1929, 85 y.o.   MRN: [id number]  HPI  Asked to see resident in [address] RN reports skin lesion to left temple She is unsure how long it has been there He denies itching or pain Lesion has not changed, possible a little bigger in size No drainage He has not used anything OTC for this.  Review of Systems      Past Medical History:  Diagnosis Date  . Asthma   . Edema    MILD OF FEET  . GERD (gastroesophageal reflux disease)   . Hypertension   . Mood disorder (HCC)    chronic anxiety    Current Outpatient Medications  Medication Sig Dispense Refill  . albuterol (PROVENTIL HFA;VENTOLIN HFA) 108 (90 Base) MCG/ACT inhaler Inhale 2 puffs into the lungs 3 (three) times daily as needed for wheezing or shortness of breath.    . budesonide-formoterol (SYMBICORT) 160-4.5 MCG/ACT inhaler Inhale 2 puffs into the lungs 2 (two) times daily.    . carbidopa-levodopa (SINEMET IR) 25-100 MG tablet Take 1 tablet by mouth 3 (three) times daily. 90 tablet 11  . citalopram (CELEXA) 10 MG tablet Take 10 mg by mouth daily.    . fluticasone (FLONASE) 50 MCG/ACT nasal spray Place 2 sprays into the nose daily.    . furosemide (LASIX) 20 MG tablet 40 mg daily.    . montelukast (SINGULAIR) 10 MG tablet Take 10 mg by mouth at bedtime.    Marland Kitchen. omeprazole (PRILOSEC) 20 MG capsule Take 1 capsule by mouth daily.    . potassium chloride SA (K-DUR) 20 MEQ tablet 20 mEq 2 (two) times daily.    Marland Kitchen. spironolactone (ALDACTONE) 25 MG tablet 25 mg daily.    . tamsulosin (FLOMAX) 0.4 MG CAPS capsule Take 0.4 mg by mouth.     No current facility-administered medications for this visit.     No Known Allergies  Family History  Problem Relation Age of Onset  . COPD Brother   . Hypertension Neg Hx   . Diabetes Neg Hx   . Heart disease Neg Hx     Social History   Socioeconomic History  . Marital status: Widowed    Spouse name: Not on file  .  Number of children: Not on file  . Years of education: Not on file  . Highest education level: Not on file  Occupational History  . 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 Financial resource strain: Not on file  . Food insecurity    Worry: Not on file    Inability: Not on file  . Transportation needs    Medical: Not on file    Non-medical: Not on file  Tobacco Use  . Smoking status: Never Smoker  . Smokeless tobacco: Never Used  Substance and Sexual Activity  . Alcohol use: No  . Drug use: No  . Sexual activity: Not on file  Lifestyle  . Physical activity    Days per week: Not on file    Minutes per session: Not on file  . Stress: Not on file  Relationships  . Social Musicianconnections    Talks on phone: Not on file    Gets together: Not on file    Attends religious service: Not on file    Active member of club or organization: Not on file    Attends meetings of clubs or organizations: Not on  file    Relationship status: Not on file  . Intimate partner violence    Fear of current or ex partner: Not on file    Emotionally abused: Not on file    Physically abused: Not on file    Forced sexual activity: Not on file  Other Topics Concern  . Not on file  Social History Narrative   Widowed 2003. 1 daughter      Has living will   Daughter Manuela Schwartz is health care POA   Would accept resuscitation attempts   No tube feeds if cognitively unaware     Constitutional: Denies fever, malaise, fatigue, headache or abrupt weight changes.  Skin: Pt reports skin lesion to left temple. Denies redness, rashes, ulcercations.    No other specific complaints in a complete review of systems (except as listed in HPI above)  Objective:   Physical Exam   BP 116/62   Pulse 84   Temp 97.7 F (36.5 C)   Resp 20   Wt 165 lb 3.2 oz (74.9 kg)   BMI 24.40 kg/m  Wt Readings from Last 3 Encounters:  12/24/18 165 lb 3.2 oz (74.9 kg)  10/10/18 166 lb  9.6 oz (75.6 kg)  07/20/18 180 lb 3.2 oz (81.7 kg)    General: Appears his stated age, well developed, well nourished in NAD. Skin: Warm, dry and intact. 1 cm oval hyperpigmented, raised, rough lesion noted of left temple. Neurological: Alert and oriented.    BMET    Component Value Date/Time   NA 137 08/20/2016 1145   K 3.7 08/20/2016 1145   CL 106 08/20/2016 1145   CO2 24 08/20/2016 1145   GLUCOSE 113 (H) 08/20/2016 1145   BUN 20 08/20/2016 1145   CREATININE 0.72 08/20/2016 1145   CALCIUM 8.6 (L) 08/20/2016 1145   GFRNONAA >60 08/20/2016 1145   GFRAA >60 08/20/2016 1145    Lipid Panel  No results found for: CHOL, TRIG, HDL, CHOLHDL, VLDL, LDLCALC  CBC    Component Value Date/Time   WBC 10.9 (H) 08/20/2016 1145   RBC 3.97 (L) 08/20/2016 1145   HGB 12.4 (L) 08/20/2016 1145   HCT 35.9 (L) 08/20/2016 1145   PLT 346 08/20/2016 1145   MCV 90.4 08/20/2016 1145   MCH 31.2 08/20/2016 1145   MCHC 34.5 08/20/2016 1145   RDW 14.9 (H) 08/20/2016 1145   LYMPHSABS 1.8 08/20/2016 1145   MONOABS 0.7 08/20/2016 1145   EOSABS 0.1 08/20/2016 1145   BASOSABS 0.0 08/20/2016 1145    Hgb A1C No results found for: HGBA1C         Assessment & Plan:   Skin Lesion of Face:  Will see if this is something Dr. Silvio Pate can freeze, if not will set him up with dermatology  Will reassess as needed Webb Silversmith, NP

## 2019-01-08 ENCOUNTER — Ambulatory Visit: Payer: Medicare Other | Admitting: Internal Medicine

## 2019-01-08 ENCOUNTER — Other Ambulatory Visit: Payer: Self-pay

## 2019-01-08 ENCOUNTER — Encounter: Payer: Self-pay | Admitting: Internal Medicine

## 2019-01-08 VITALS — BP 125/59 | HR 72 | Temp 98.1°F | Resp 20 | Wt 166.2 lb

## 2019-01-08 DIAGNOSIS — L821 Other seborrheic keratosis: Secondary | ICD-10-CM | POA: Diagnosis not present

## 2019-01-08 DIAGNOSIS — G20A1 Parkinson's disease without dyskinesia, without mention of fluctuations: Secondary | ICD-10-CM

## 2019-01-08 DIAGNOSIS — I872 Venous insufficiency (chronic) (peripheral): Secondary | ICD-10-CM

## 2019-01-08 DIAGNOSIS — I10 Essential (primary) hypertension: Secondary | ICD-10-CM

## 2019-01-08 DIAGNOSIS — J4520 Mild intermittent asthma, uncomplicated: Secondary | ICD-10-CM

## 2019-01-08 DIAGNOSIS — N401 Enlarged prostate with lower urinary tract symptoms: Secondary | ICD-10-CM

## 2019-01-08 DIAGNOSIS — G20 Parkinson's disease: Secondary | ICD-10-CM | POA: Diagnosis not present

## 2019-01-08 DIAGNOSIS — N138 Other obstructive and reflux uropathy: Secondary | ICD-10-CM

## 2019-01-08 DIAGNOSIS — F39 Unspecified mood [affective] disorder: Secondary | ICD-10-CM

## 2019-01-08 NOTE — Assessment & Plan Note (Signed)
Left temple lesion not actinic Also shows me spot on right forearm---stable and also doesn't look precancerous Will just observe all these

## 2019-01-08 NOTE — Assessment & Plan Note (Signed)
Stable functional status with low dose sinemet Doing well in AL setting

## 2019-01-08 NOTE — Assessment & Plan Note (Signed)
Mostly just nocturia No evidence of obstruction

## 2019-01-08 NOTE — Assessment & Plan Note (Signed)
BP Readings from Last 3 Encounters:  01/08/19 (!) 125/59  12/24/18 116/62  10/10/18 119/63   Good control

## 2019-01-08 NOTE — Assessment & Plan Note (Signed)
Controlled with the symbicort 

## 2019-01-08 NOTE — Assessment & Plan Note (Signed)
Still doing well--now off the citalopram

## 2019-01-08 NOTE — Assessment & Plan Note (Signed)
Markedly better on just low dose spironolactone No action needed

## 2019-01-08 NOTE — Progress Notes (Signed)
Subjective:    Patient ID: Brandon Calhoun, male    DOB: 04-04-1930, 85 y.o.   MRN: [id number]  HPI Visit in assisted living apartment for follow up of chronic health conditions Reviewed status with Luellen Pucker RN  His legs are much better The fluid accumulation has vastly improved Off the furosemide now No SOB No chest pain No dizziness or syncope  Now off the citalopram Mood remains good Slight worry about the COVID mess---but nothing serious No regular depression  Not anxious  Parkinson's is stable Walks with rolling walker Satisfied with the meds Assist with bathing and dressing Independent with bathroom---remains continent  Asthma seems controlled On symbicort and hasn't needed rescue inhaler No cough or wheezing  Current Outpatient Medications on File Prior to Visit  Medication Sig Dispense Refill  . albuterol (PROVENTIL HFA;VENTOLIN HFA) 108 (90 Base) MCG/ACT inhaler Inhale 2 puffs into the lungs 3 (three) times daily as needed for wheezing or shortness of breath.    . budesonide-formoterol (SYMBICORT) 160-4.5 MCG/ACT inhaler Inhale 2 puffs into the lungs 2 (two) times daily.    . carbidopa-levodopa (SINEMET IR) 25-100 MG tablet Take 1 tablet by mouth 3 (three) times daily. 90 tablet 11  . fluticasone (FLONASE) 50 MCG/ACT nasal spray Place 2 sprays into the nose daily.    . montelukast (SINGULAIR) 10 MG tablet Take 10 mg by mouth at bedtime.    Marland Kitchen omeprazole (PRILOSEC) 20 MG capsule Take 1 capsule by mouth daily.    Marland Kitchen spironolactone (ALDACTONE) 25 MG tablet 25 mg daily.    . tamsulosin (FLOMAX) 0.4 MG CAPS capsule Take 0.4 mg by mouth.     No current facility-administered medications on file prior to visit.     No Known Allergies  Past Medical History:  Diagnosis Date  . Asthma   . Edema    MILD OF FEET  . GERD (gastroesophageal reflux disease)   . Hypertension   . Mood disorder ([place])    chronic anxiety    Past Surgical History:  Procedure Laterality  Date  . CATARACT EXTRACTION W/PHACO Left 02/16/2016   Procedure: CATARACT EXTRACTION PHACO AND INTRAOCULAR LENS PLACEMENT (IOC);  Surgeon: Eulogio Bear, MD;  Location: [facility];  Service: Ophthalmology;  Laterality: Left;  [place] 1.10 AP% 10.6 CDE 7.41 Fluid Pack Lot # [id number]  . CATARACT EXTRACTION W/PHACO Right 03/08/2016   Procedure: CATARACT EXTRACTION PHACO AND INTRAOCULAR LENS PLACEMENT (IOC);  Surgeon: Eulogio Bear, MD;  Location: [facility];  Service: Ophthalmology;  Laterality: Right;  [place] 1.14 AP% 11.6 CDE 11.80 Fluid Pack Lot # [id number]  . HERNIA REPAIR    . HIP ARTHROPLASTY Left 07/06/2016   Procedure: ARTHROPLASTY BIPOLAR HIP (HEMIARTHROPLASTY);  Surgeon: Hessie Knows, MD;  Location: [facility] OR[facility];  Service: Orthopedics;  Laterality: Left;    Family History  Problem Relation Age of Onset  . COPD Brother   . Hypertension Neg Hx   . Diabetes Neg Hx   . Heart disease Neg Hx     Social History   Socioeconomic History  . Marital status: Widowed    Spouse name: Not on file  . Number of children: Not on file  . Years of education: Not on file  . Highest education level: Not on file  Occupational History  . Occupation: Education officer, environmental    Comment: Retired  Scientific laboratory technician  . Financial resource strain: Not on file  . Food insecurity    Worry: Not on file    Inability: Not  on file  . Transportation needs    Medical: Not on file    Non-medical: Not on file  Tobacco Use  . Smoking status: Never Smoker  . Smokeless tobacco: Never Used  Substance and Sexual Activity  . Alcohol use: No  . Drug use: No  . Sexual activity: Not on file  Lifestyle  . Physical activity    Days per week: Not on file    Minutes per session: Not on file  . Stress: Not on file  Relationships  . Social Musicianconnections    Talks on phone: Not on file    Gets together: Not on file    Attends religious service: Not on file    Active member of club or organization: Not on file     Attends meetings of clubs or organizations: Not on file    Relationship status: Not on file  . Intimate partner violence    Fear of current or ex partner: Not on file    Emotionally abused: Not on file    Physically abused: Not on file    Forced sexual activity: Not on file  Other Topics Concern  . Not on file  Social History Narrative   Widowed 2003. 1 daughter      Has living will   Daughter Darl PikesSusan is health care POA   Would accept resuscitation attempts   No tube feeds if cognitively unaware    Review of Systems Appetite is good Weight stable Sleeps well---stable nocturia x 3 Daytime urine flow is fine    Objective:   Physical Exam  Constitutional: He appears well-developed. No distress.  Neck: No thyromegaly present.  Cardiovascular: Normal rate, regular rhythm and normal heart sounds. Exam reveals no gallop.  No murmur heard. Respiratory: Effort normal and breath sounds normal. No respiratory distress. He has no wheezes. He has no rales.  GI: Soft. There is no abdominal tenderness.  Musculoskeletal:     Comments: No sig edema! (hose on)  Lymphadenopathy:    He has no cervical adenopathy.  Neurological:  No sig tremor Same mild bradykinesia  Skin:  Lesion of concern on left temple is a partially removed seborrheic keratosis Multiple other lesions along hairline--but none are actinic  Psychiatric: He has a normal mood and affect. His behavior is normal.           Assessment & Plan:

## 2019-05-06 ENCOUNTER — Ambulatory Visit: Payer: Medicare Other | Admitting: Internal Medicine

## 2019-05-06 ENCOUNTER — Encounter: Payer: Self-pay | Admitting: Internal Medicine

## 2019-05-06 ENCOUNTER — Other Ambulatory Visit: Payer: Self-pay

## 2019-05-06 DIAGNOSIS — N138 Other obstructive and reflux uropathy: Secondary | ICD-10-CM

## 2019-05-06 DIAGNOSIS — K219 Gastro-esophageal reflux disease without esophagitis: Secondary | ICD-10-CM | POA: Diagnosis not present

## 2019-05-06 DIAGNOSIS — I10 Essential (primary) hypertension: Secondary | ICD-10-CM

## 2019-05-06 DIAGNOSIS — F39 Unspecified mood [affective] disorder: Secondary | ICD-10-CM

## 2019-05-06 DIAGNOSIS — G20 Parkinson's disease: Secondary | ICD-10-CM | POA: Diagnosis not present

## 2019-05-06 DIAGNOSIS — J4520 Mild intermittent asthma, uncomplicated: Secondary | ICD-10-CM

## 2019-05-06 DIAGNOSIS — N401 Enlarged prostate with lower urinary tract symptoms: Secondary | ICD-10-CM

## 2019-05-06 DIAGNOSIS — I872 Venous insufficiency (chronic) (peripheral): Secondary | ICD-10-CM | POA: Diagnosis not present

## 2019-05-06 MED ORDER — CITALOPRAM HYDROBROMIDE 10 MG PO TABS: 10.0000 mg | ORAL_TABLET | Freq: Every day | ORAL | 2 refills | Status: DC

## 2019-05-06 NOTE — Assessment & Plan Note (Signed)
Patient still gets up about 3 X per night to urinate.  Continue taking Tamsulosin 0.4 mg as prescribed. Will continue to monitor.

## 2019-05-06 NOTE — Assessment & Plan Note (Signed)
Reports increased drooling at times, most likely realted to Parkinson's. No other concerns.  Continue with Sinimet.  Will continue to monitor.

## 2019-05-06 NOTE — Assessment & Plan Note (Signed)
Reports no new concerns.  Managed with Spiranolactone 25mg .  Continue to wear Ted hose.  Encouraged to continue with regular exercise/walks.

## 2019-05-06 NOTE — Progress Notes (Signed)
Subjective:    Patient ID: Brandon Calhoun, male    DOB: 1930/06/16, 85 y.o.   MRN: [id number]  HPI  Saw resident in [address] for routine follow up No new concerns from RN.  Resident reports no new concerns. Patient states he sleeps well. Eating great. Weight is 166.4 lbs and is stable. Patient uses walker for transport. Patient denies any issues with bowels. Reports no issues with medications and is taking all as prescribed. Patient denies pain, SOB or reflux.   Parkinsons: Reports increased drooling at times. Managed on Sinimet.   Mood Disorder: Mood is appropriate today. Managed on Citalopram.   GERD: Reports no reflux but does complain of difficulty swallowing. However, feels this is related to secretions and not reflux.. Controlled with Omeprazole.   CVI: Managed with Sprinaolactone and Ted hose.   Asthma: Reports no new concerns. Managed with Fluticasone, Monelukast and Symbicort.   BPH: Patient still gets up about 3 X per night. Taking Tamsulosin.  Review of Systems  Past Medical History:  Diagnosis Date  . Asthma   . Edema    MILD OF FEET  . GERD (gastroesophageal reflux disease)   . Hypertension   . Mood disorder (HCC)    chronic anxiety    Current Outpatient Medications  Medication Sig Dispense Refill  . albuterol (PROVENTIL HFA;VENTOLIN HFA) 108 (90 Base) MCG/ACT inhaler Inhale 2 puffs into the lungs 3 (three) times daily as needed for wheezing or shortness of breath.    . budesonide-formoterol (SYMBICORT) 160-4.5 MCG/ACT inhaler Inhale 2 puffs into the lungs 2 (two) times daily.    . carbidopa-levodopa (SINEMET IR) 25-100 MG tablet Take 1 tablet by mouth 3 (three) times daily. 90 tablet 11  . fluticasone (FLONASE) 50 MCG/ACT nasal spray Place 2 sprays into the nose daily.    . montelukast (SINGULAIR) 10 MG tablet Take 10 mg by mouth at bedtime.    Marland Kitchen omeprazole (PRILOSEC) 20 MG capsule Take 1 capsule by mouth daily.    Marland Kitchen spironolactone (ALDACTONE) 25 MG  tablet 25 mg daily.    . tamsulosin (FLOMAX) 0.4 MG CAPS capsule Take 0.4 mg by mouth.     No current facility-administered medications for this visit.     No Known Allergies  Family History  Problem Relation Age of Onset  . COPD Brother   . Hypertension Neg Hx   . Diabetes Neg Hx   . Heart disease Neg Hx     Social History   Socioeconomic History  . Marital status: Widowed    Spouse name: Not on file  . Number of children: Not on file  . Years of education: Not on file  . Highest education level: Not on file  Occupational History  . Occupation: Therapist, nutritional    Comment: Retired  Engineer, production  . Financial resource strain: Not on file  . Food insecurity    Worry: Not on file    Inability: Not on file  . Transportation needs    Medical: Not on file    Non-medical: Not on file  Tobacco Use  . Smoking status: Never Smoker  . Smokeless tobacco: Never Used  Substance and Sexual Activity  . Alcohol use: No  . Drug use: No  . Sexual activity: Not on file  Lifestyle  . Physical activity    Days per week: Not on file    Minutes per session: Not on file  . Stress: Not on file  Relationships  . Social connections  Talks on phone: Not on file    Gets together: Not on file    Attends religious service: Not on file    Active member of club or organization: Not on file    Attends meetings of clubs or organizations: Not on file    Relationship status: Not on file  . Intimate partner violence    Fear of current or ex partner: Not on file    Emotionally abused: Not on file    Physically abused: Not on file    Forced sexual activity: Not on file  Other Topics Concern  . Not on file  Social History Narrative   Widowed 2003. 1 daughter      Has living will   Daughter Manuela Schwartz is health care POA   Would accept resuscitation attempts   No tube feeds if cognitively unaware     Constitutional: Denies fever, malaise, fatigue, headache or abrupt  weight changes.  HEENT: Pt reports increased salivation. Denies eye pain, eye redness, ear pain, ringing in the ears, wax buildup, runny nose, nasal congestion, bloody nose, or sore throat. Respiratory: Denies difficulty breathing, shortness of breath, cough or sputum production.   Cardiovascular: Pt reports swelling in BLE. Denies chest pain, chest tightness, palpitations or swelling in the hands.  Gastrointestinal: Denies abdominal pain, bloating, constipation, diarrhea or blood in the stool.  GU: Pt reports nocturia. Denies urgency, frequency, pain with urination, burning sensation, blood in urine, odor or discharge. Musculoskeletal: Denies decrease in range of motion, difficulty with gait, muscle pain or joint pain and swelling.  Skin: Denies redness, rashes, lesions or ulcercations.  Neurological: Denies dizziness, difficulty with memory, difficulty with speech or problems with balance and coordination.  Psych: Denies anxiety, depression, SI/HI.  No other specific complaints in a complete review of systems (except as listed in HPI above).     Objective:   Physical Exam  BP (!) 142/68   Pulse 70   Temp 98.2 F (36.8 C)   Resp 18   Wt 166 lb 6.4 oz (75.5 kg)   BMI 24.57 kg/m  Wt Readings from Last 3 Encounters:  05/06/19 166 lb 6.4 oz (75.5 kg)  01/08/19 166 lb 3.2 oz (75.4 kg)  12/24/18 165 lb 3.2 oz (74.9 kg)    General: Appears his stated age, engages fully in NAD. Skin: Warm, dry and intact. No ulcerations noted. HEENT: Head: normal shape and size; Eyes: sclera white, no icterus, conjunctiva pink and EOMs intact.  Neck:  Neck supple, trachea midline. No masses, lumps or thyromegaly present.  Cardiovascular: 1+ pitting edema to bilateral lower extremities. Ted socks worn. Normal rate and rhythm. S1,S2 noted.  No murmur, rubs or gallops noted.  Pulmonary/Chest: Normal effort and positive vesicular breath sounds. No respiratory distress. No wheezes, rales or ronchi noted.   Abdomen: Soft and nontender. Normal bowel sounds. No distention or masses noted.  Musculoskeletal: Gait slow and steady with use of rolling walker. Neurological: Alert and oriented.    BMET    Component Value Date/Time   NA 137 08/20/2016 1145   K 3.7 08/20/2016 1145   CL 106 08/20/2016 1145   CO2 24 08/20/2016 1145   GLUCOSE 113 (H) 08/20/2016 1145   BUN 20 08/20/2016 1145   CREATININE 0.72 08/20/2016 1145   CALCIUM 8.6 (L) 08/20/2016 1145   GFRNONAA >60 08/20/2016 1145   GFRAA >60 08/20/2016 1145    Lipid Panel  No results found for: CHOL, TRIG, HDL, CHOLHDL, VLDL, LDLCALC  CBC  Component Value Date/Time   WBC 10.9 (H) 08/20/2016 1145   RBC 3.97 (L) 08/20/2016 1145   HGB 12.4 (L) 08/20/2016 1145   HCT 35.9 (L) 08/20/2016 1145   PLT 346 08/20/2016 1145   MCV 90.4 08/20/2016 1145   MCH 31.2 08/20/2016 1145   MCHC 34.5 08/20/2016 1145   RDW 14.9 (H) 08/20/2016 1145   LYMPHSABS 1.8 08/20/2016 1145   MONOABS 0.7 08/20/2016 1145   EOSABS 0.1 08/20/2016 1145   BASOSABS 0.0 08/20/2016 1145    Hgb A1C No results found for: HGBA1C         Assessment & Plan:

## 2019-05-06 NOTE — Assessment & Plan Note (Addendum)
Reports no reflux but does complain of difficulty swallowing. However, feels this is related to secretions and not reflux..  Continue Omeprazole.

## 2019-05-06 NOTE — Patient Instructions (Signed)
Parkinson's Disease  Parkinson's disease causes problems with movements. It is a long-term condition. It gets worse over time (is progressive). It affects each person in different ways. It makes it harder for you to:  · Control how your body moves.  · Move your body normally.  The condition can range from mild to very bad (advanced).  What are the causes?  This condition results from a loss of brain cells called neurons. These brain cells make a chemical called dopamine, which is needed to control body movement. As the condition gets worse, the brain cells make less dopamine. This makes it hard to move or control your movements.  The exact cause of this condition is not known.  What increases the risk?  · Being male.  · Being age 60 or older.  · Having family members who had Parkinson's disease.  · Having had an injury to the brain.  · Being very sad (depressed).  · Being around things that are harmful or poisonous.  What are the signs or symptoms?  Symptoms of this condition can vary. The main symptoms have to do with movement. These include:  · A tremor or shaking while you are resting that you cannot control.  · Stiffness in your neck, arms, and legs.  · Slowing of movement. This may include:  ? Losing expressions of the face.  ? Having trouble making small movements that are needed to button your clothing or brush your teeth.  · Walking in a way that is not normal. You may walk with short, shuffling steps.  · Loss of balance when standing. You may sway, fall backward, or have trouble making turns.  Other symptoms include:  · Being very sad, worried, or confused.  · Seeing or hearing things that are not real.  · Losing thinking abilities (dementia).  · Trouble speaking or swallowing.  · Having a hard time pooping (constipation).  · Needing to pee right away, peeing often, or not being able to control when you pee or poop.  · Sleep problems.  How is this treated?  There is no cure. The goal of treatment is  to manage your symptoms. Treatment may include:  · Medicines.  · Therapy to help with talking or movement.  · Surgery to reduce shaking and other movements that you cannot control.  Follow these instructions at home:  Medicines  · Take over-the-counter and prescription medicines only as told by your doctor.  · Avoid taking pain or sleeping medicines.  Eating and drinking  · Follow instructions from your doctor about what you cannot eat or drink.  · Do not drink alcohol.  Activity  · Talk with your doctor about if it is safe for you to drive.  · Do exercises as told by your doctor.  Lifestyle         · Put in grab bars and railings in your home. These help to prevent falls.  · Do not use any products that contain nicotine or tobacco, such as cigarettes, e-cigarettes, and chewing tobacco. If you need help quitting, ask your doctor.  · Join a support group.  General instructions  · Talk with your doctor about what you need help with and what your safety needs are.  · Keep all follow-up visits as told by your doctor, including any therapy visits to help with talking or moving. This is important.  Contact a doctor if:  · Medicines do not help your symptoms.  · You feel off-balance.  ·   You fall at home.  · You need more help at home.  · You have trouble swallowing.  · You have a very hard time pooping.  · You have a lot of side effects from your medicines.  · You feel very sad, worried, or confused.  Get help right away if:  · You were hurt in a fall.  · You see or hear things that are not real.  · You cannot swallow without choking.  · You have chest pain or trouble breathing.  · You do not feel safe at home.  · You have thoughts about hurting yourself or others.  If you ever feel like you may hurt yourself or others, or have thoughts about taking your own life, get help right away. You can go to your nearest emergency department or call:  · Your local emergency services (911 in the [place]  · A suicide crisis helpline,  such as the National Suicide Prevention Lifeline at 1-800-273-8255. This is open 24 hours a day.  Summary  · This condition causes problems with movements.  · It is a long-term condition. It gets worse over time.  · There is no cure. Treatment focuses on managing your symptoms.  · Talk with your doctor about what you need help with and what your safety needs are.  · Keep all follow-up visits as told by your doctor. This is important.  This information is not intended to replace advice given to you by your health care provider. Make sure you discuss any questions you have with your health care provider.  Document Released: 08/27/2011 Document Revised: 08/21/2018 Document Reviewed: 08/21/2018  Elsevier Patient Education © 2020 [employer] Inc[employer]

## 2019-05-06 NOTE — Assessment & Plan Note (Signed)
Mood is appropriate today.  Managed on Citalopram. Will continue to monitor.

## 2019-05-06 NOTE — Assessment & Plan Note (Signed)
Continue Fluticasone, Montelukast and Symbicort Will monitor

## 2019-05-06 NOTE — Assessment & Plan Note (Signed)
Controlled off meds  Will monitor 

## 2019-07-30 ENCOUNTER — Encounter: Payer: Self-pay | Admitting: Internal Medicine

## 2019-07-30 ENCOUNTER — Ambulatory Visit: Payer: Medicare Other | Admitting: Internal Medicine

## 2019-07-30 ENCOUNTER — Other Ambulatory Visit: Payer: Self-pay

## 2019-07-30 VITALS — BP 138/70 | HR 61 | Temp 98.0°F | Resp 18 | Wt 165.8 lb

## 2019-07-30 DIAGNOSIS — G20 Parkinson's disease: Secondary | ICD-10-CM

## 2019-07-30 DIAGNOSIS — N138 Other obstructive and reflux uropathy: Secondary | ICD-10-CM

## 2019-07-30 DIAGNOSIS — I872 Venous insufficiency (chronic) (peripheral): Secondary | ICD-10-CM | POA: Diagnosis not present

## 2019-07-30 DIAGNOSIS — J4520 Mild intermittent asthma, uncomplicated: Secondary | ICD-10-CM

## 2019-07-30 DIAGNOSIS — I10 Essential (primary) hypertension: Secondary | ICD-10-CM

## 2019-07-30 DIAGNOSIS — F39 Unspecified mood [affective] disorder: Secondary | ICD-10-CM

## 2019-07-30 DIAGNOSIS — N401 Enlarged prostate with lower urinary tract symptoms: Secondary | ICD-10-CM

## 2019-07-30 NOTE — Assessment & Plan Note (Signed)
Quiet on his current inhaler

## 2019-07-30 NOTE — Assessment & Plan Note (Signed)
Increased leg heaviness seems to be related to PD If this persists, and is troubling, will increase the dose on his sinemet

## 2019-07-30 NOTE — Assessment & Plan Note (Signed)
Doing well on low dose citalopram Will continue for now

## 2019-07-30 NOTE — Assessment & Plan Note (Signed)
Managed well with low dose spironolactone

## 2019-07-30 NOTE — Assessment & Plan Note (Signed)
BP Readings from Last 3 Encounters:  07/30/19 138/70  05/06/19 (!) 142/68  01/08/19 (!) 125/59   Good control No changes needed

## 2019-07-30 NOTE — Progress Notes (Signed)
Subjective:    Patient ID: Brandon Calhoun, male    DOB: 07/22/1929, 86 y.o.   MRN: [id number]  HPI Visit in [place] apartment for review of chronic health conditions Reviewed status with Luellen Pucker RN  His feet feel "heavy" x 1 week or so No increased swelling Just hard to pick them up Doesn't notice any difference after medications  Walks himself with walker to dining room Doesn't use it in room mostly Assist still for showering and dressing (aides bid) Independent with bathroom--no incontinence  Swelling remains under control with spironolactone No SOB Sleeps in bed with 2 pillows. No PND No chest pain or palpitations  Voids okay Nocturia x 3  No cough or wheezing in general Some cough after eating--no choking  Tired of COVID restrictions--but glad to have a little freedom back now No sig depression  Current Outpatient Medications on File Prior to Visit  Medication Sig Dispense Refill  . albuterol (PROVENTIL HFA;VENTOLIN HFA) 108 (90 Base) MCG/ACT inhaler Inhale 2 puffs into the lungs 3 (three) times daily as needed for wheezing or shortness of breath.    . budesonide-formoterol (SYMBICORT) 160-4.5 MCG/ACT inhaler Inhale 2 puffs into the lungs 2 (two) times daily.    . carbidopa-levodopa (SINEMET IR) 25-100 MG tablet Take 1 tablet by mouth 3 (three) times daily. 90 tablet 11  . citalopram (CELEXA) 10 MG tablet Take 1 tablet (10 mg total) by mouth daily. 30 tablet 2  . fluticasone (FLONASE) 50 MCG/ACT nasal spray Place 2 sprays into the nose daily.    . montelukast (SINGULAIR) 10 MG tablet Take 10 mg by mouth at bedtime.    Marland Kitchen omeprazole (PRILOSEC) 20 MG capsule Take 1 capsule by mouth daily.    Marland Kitchen spironolactone (ALDACTONE) 25 MG tablet 25 mg daily.    . tamsulosin (FLOMAX) 0.4 MG CAPS capsule Take 0.4 mg by mouth.     No current facility-administered medications on file prior to visit.    No Known Allergies  Past Medical History:  Diagnosis Date  . Asthma   . Edema      MILD OF FEET  . GERD (gastroesophageal reflux disease)   . Hypertension   . Mood disorder ([place])    chronic anxiety    Past Surgical History:  Procedure Laterality Date  . CATARACT EXTRACTION W/PHACO Left 02/16/2016   Procedure: CATARACT EXTRACTION PHACO AND INTRAOCULAR LENS PLACEMENT (IOC);  Surgeon: Eulogio Bear, MD;  Location: [facility] ORS;  Service: Ophthalmology;  Laterality: Left;  [place] 1.10 AP% 10.6 CDE 7.41 Fluid Pack Lot # [id number]  . CATARACT EXTRACTION W/PHACO Right 03/08/2016   Procedure: CATARACT EXTRACTION PHACO AND INTRAOCULAR LENS PLACEMENT (IOC);  Surgeon: Eulogio Bear, MD;  Location: [facility] ORS;  Service: Ophthalmology;  Laterality: Right;  [place] 1.14 AP% 11.6 CDE 11.80 Fluid Pack Lot # [id number]  . HERNIA REPAIR    . HIP ARTHROPLASTY Left 07/06/2016   Procedure: ARTHROPLASTY BIPOLAR HIP (HEMIARTHROPLASTY);  Surgeon: Hessie Knows, MD;  Location: [facility] ORS;  Service: Orthopedics;  Laterality: Left;    Family History  Problem Relation Age of Onset  . COPD Brother   . Hypertension Neg Hx   . Diabetes Neg Hx   . Heart disease Neg Hx     Social History   Socioeconomic History  . Marital status: Widowed    Spouse name: Not on file  . Number of children: Not on file  . Years of education: Not on file  . Highest education level: Not on  file  Occupational History  . 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 Smoking status: Never Smoker  . Smokeless tobacco: Never Used  Substance and Sexual Activity  . Alcohol use: No  . Drug use: No  . Sexual activity: Not on file  Other Topics Concern  . Not on file  Social History Narrative   Widowed 2003. 1 daughter      Has living will   Daughter Darl Pikes is health care POA   Would accept resuscitation attempts   No tube feeds if cognitively unaware   Social Determinants of Health   Financial Resource Strain:   . Difficulty of Paying Living Expenses: Not on file  Food  Insecurity:   . Worried About Programme researcher, broadcasting/film/video in the Last Year: Not on file  . Ran Out of Food in the Last Year: Not on file  Transportation Needs:   . Lack of Transportation (Medical): Not on file  . Lack of Transportation (Non-Medical): Not on file  Physical Activity:   . Days of Exercise per Week: Not on file  . Minutes of Exercise per Session: Not on file  Stress:   . Feeling of Stress : Not on file  Social Connections:   . Frequency of Communication with Friends and Family: Not on file  . Frequency of Social Gatherings with Friends and Family: Not on file  . Attends Religious Services: Not on file  . Active Member of Clubs or Organizations: Not on file  . Attends Banker Meetings: Not on file  . Marital Status: Not on file  Intimate Partner Violence:   . Fear of Current or Ex-Partner: Not on file  . Emotionally Abused: Not on file  . Physically Abused: Not on file  . Sexually Abused: Not on file   Review of Systems Did get second COVID vaccine recently--no clear fever/illness with this Sleeps well despite the nocturia Appetite is good Weight is stable Bowels move regularly    Objective:   Physical Exam  Constitutional: He appears well-developed. No distress.  Neck: No thyromegaly present.  Cardiovascular: Normal rate, regular rhythm and normal heart sounds. Exam reveals no gallop.  No murmur heard. Respiratory: Effort normal and breath sounds normal. No respiratory distress. He has no wheezes. He has no rales.  GI: Soft. There is no abdominal tenderness.  Musculoskeletal:     Comments: No sig edema with hose on  Lymphadenopathy:    He has no cervical adenopathy.  Neurological:  No sig tremor Mild bradykinesia Gait slightly more shuffling           Assessment & Plan:

## 2019-07-30 NOTE — Assessment & Plan Note (Signed)
Voiding okay 

## 2019-10-21 ENCOUNTER — Ambulatory Visit: Payer: Medicare Other | Admitting: Internal Medicine

## 2019-10-21 ENCOUNTER — Other Ambulatory Visit: Payer: Self-pay

## 2019-10-21 ENCOUNTER — Encounter: Payer: Self-pay | Admitting: Internal Medicine

## 2019-10-21 DIAGNOSIS — K219 Gastro-esophageal reflux disease without esophagitis: Secondary | ICD-10-CM

## 2019-10-21 DIAGNOSIS — G20 Parkinson's disease: Secondary | ICD-10-CM | POA: Diagnosis not present

## 2019-10-21 DIAGNOSIS — J4520 Mild intermittent asthma, uncomplicated: Secondary | ICD-10-CM | POA: Diagnosis not present

## 2019-10-21 DIAGNOSIS — F39 Unspecified mood [affective] disorder: Secondary | ICD-10-CM

## 2019-10-21 DIAGNOSIS — N401 Enlarged prostate with lower urinary tract symptoms: Secondary | ICD-10-CM

## 2019-10-21 DIAGNOSIS — I872 Venous insufficiency (chronic) (peripheral): Secondary | ICD-10-CM

## 2019-10-21 DIAGNOSIS — N138 Other obstructive and reflux uropathy: Secondary | ICD-10-CM

## 2019-10-21 NOTE — Assessment & Plan Note (Signed)
-   Continue Symbicort  

## 2019-10-21 NOTE — Assessment & Plan Note (Signed)
Continue Omeprazole ?

## 2019-10-21 NOTE — Assessment & Plan Note (Signed)
Ongoing issues Consider adding Finasteride to regimen- will discuss with Dr. Alphonsus Sias Encouraged him to utilize a urinal at bedtime- he does not think he would be about to use this and his daughter would prefer him to get up to use the restroom

## 2019-10-21 NOTE — Patient Instructions (Signed)

## 2019-10-21 NOTE — Assessment & Plan Note (Signed)
Discussed increasing Citalopram to 15 mg, but he would like to hold off for now Will monitor.

## 2019-10-21 NOTE — Assessment & Plan Note (Signed)
Continue Spironolactone Encouraged TED's and elevation

## 2019-10-21 NOTE — Progress Notes (Signed)
Subjective:    Patient ID: Brandon Calhoun, male    DOB: [birth year], 86 y.o.   MRN: [id number]  HPI  Resident see in [address] for routine follow up. RN reports resident had a fall yesterday. He reports he was walking from the bedroom to the living room, tripped and landed on his right side. He sustained skin tears to his right hand and right elbow. Resident reports his legs feel heavy, and he thinks that is why he tripped. Brandon Calhoun, RP, reports she has noticed that he is dragging his shoes-  She can see the scuff marks on them. He does have some persistent swelling but this is not nearly as bad as it used to be.   Otherwise, he sleeps well for the most part. He has had trouble sleeping for the last 2-3 due to nocturia. He is independent with ADL's. He walks with a walker. His appetite is good, weight down 8 lbs. He has urinary frequency during the day and nocturia at night. His bowels are moving normally. He denies joint pain, reflux or SOB.  Anxiety: He reports slightly worsening anxiety after the fall. He has felt more nervous and on edge. He is taking Citalopram as prescribed.  Parkinson's: + Tremor, + Bradykinesia. He is taking Sinemet as prescribed. He stopped going to boxing class secondary to Covid.  GERD: He denies issues on Omeprazole. He denies swallowing difficulty.  Asthma: He denies chronic cough or SOB. He is taking Symbicort as prescribed. He has not had to use Albuterol lately.  BPH: Persistent urinary frequency and nocturia.  CVI: Persistent swelling, which concerns him. He is taking Spironolactone as prescribed.   Review of Systems      Past Medical History:  Diagnosis Date  . Asthma   . Edema    MILD OF FEET  . GERD (gastroesophageal reflux disease)   . Hypertension   . Mood disorder (HCC)    chronic anxiety    Current Outpatient Medications  Medication Sig Dispense Refill  . budesonide-formoterol (SYMBICORT) 160-4.5 MCG/ACT inhaler Inhale 2 puffs into the  lungs 2 (two) times daily.    . carbidopa-levodopa (SINEMET IR) 25-100 MG tablet Take 1 tablet by mouth 3 (three) times daily. 90 tablet 11  . citalopram (CELEXA) 10 MG tablet Take 1 tablet (10 mg total) by mouth daily. 30 tablet 2  . fluticasone (FLONASE) 50 MCG/ACT nasal spray Place 2 sprays into the nose daily.    . montelukast (SINGULAIR) 10 MG tablet Take 10 mg by mouth at bedtime.    Marland Kitchen omeprazole (PRILOSEC) 20 MG capsule Take 1 capsule by mouth daily.    Marland Kitchen spironolactone (ALDACTONE) 25 MG tablet 25 mg daily.    . tamsulosin (FLOMAX) 0.4 MG CAPS capsule Take 0.4 mg by mouth.    Marland Kitchen albuterol (PROVENTIL HFA;VENTOLIN HFA) 108 (90 Base) MCG/ACT inhaler Inhale 2 puffs into the lungs 3 (three) times daily as needed for wheezing or shortness of breath.     No current facility-administered medications for this visit.    No Known Allergies  Family History  Problem Relation Age of Onset  . COPD Brother   . Hypertension Neg Hx   . Diabetes Neg Hx   . Heart disease Neg Hx     Social History   Socioeconomic History  . Marital status: Widowed    Spouse name: Not on file  . Number of children: Not on file  . Years of education: Not on file  . Highest  education level: Not on file  Occupational History  . 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 Smoking status: Never Smoker  . Smokeless tobacco: Never Used  Substance and Sexual Activity  . Alcohol use: No  . Drug use: No  . Sexual activity: Not on file  Other Topics Concern  . Not on file  Social History Narrative   Widowed 2003. 1 daughter      Has living will   Daughter Manuela Schwartz is health care POA   Would accept resuscitation attempts   No tube feeds if cognitively unaware   Social Determinants of Health   Financial Resource Strain:   . Difficulty of Paying Living Expenses:   Food Insecurity:   . Worried About Charity fundraiser in the Last Year:   . Arboriculturist in the Last  Year:   Transportation Needs:   . Film/video editor (Medical):   Marland Kitchen Lack of Transportation (Non-Medical):   Physical Activity:   . Days of Exercise per Week:   . Minutes of Exercise per Session:   Stress:   . Feeling of Stress :   Social Connections:   . Frequency of Communication with Friends and Family:   . Frequency of Social Gatherings with Friends and Family:   . Attends Religious Services:   . Active Member of Clubs or Organizations:   . Attends Archivist Meetings:   Marland Kitchen Marital Status:   Intimate Partner Violence:   . Fear of Current or Ex-Partner:   . Emotionally Abused:   Marland Kitchen Physically Abused:   . Sexually Abused:      Constitutional: Pt has had some weight loss. Denies fever, malaise, fatigue, headache.  HEENT: Denies eye pain, eye redness, ear pain, ringing in the ears, wax buildup, runny nose, nasal congestion, bloody nose, or sore throat. Respiratory: Denies difficulty breathing, shortness of breath, cough or sputum production.   Cardiovascular: Pt reports swelling in legs. Denies chest pain, chest tightness, palpitations or swelling in the hands.  Gastrointestinal: Denies abdominal pain, bloating, constipation, diarrhea or blood in the stool.  GU: Pt reports urinary frequency, nocturia and incontinence. Denies urgency, pain with urination, burning sensation, blood in urine, odor or discharge. Musculoskeletal: Pt reports difficulty with gait. Denies muscle pain or joint pain and swelling.  Skin: Pt reports skin tear to right hand, right elbow. Denies redness, rashes, lesions or ulcercations.  Neurological: Pt reports difficulty with balance and coordination. Denies dizziness, difficulty with memory, difficulty with speech.  Psych: Pt reports anxiety. Denies depression, SI/HI.  No other specific complaints in a complete review of systems (except as listed in HPI above).   Objective:   Physical Exam  BP 118/63   Pulse 63   Temp 98.2 F (36.8 C)    Resp 18   Wt 157 lb (71.2 kg)   BMI 23.18 kg/m  Wt Readings from Last 3 Encounters:  10/21/19 157 lb (71.2 kg)  07/30/19 165 lb 12.8 oz (75.2 kg)  05/06/19 166 lb 6.4 oz (75.5 kg)    General: Appears his stated age, well developed, well nourished in NAD. Skin: Bandages to right hand, right elbow. HEENT: Head: normal shape and size; Eyes: sclera white, no icterus, conjunctiva pink, PERRLA and EOMs intact;  Neck:  Neck supple, trachea midline. No masses, lumps or thyromegaly present.  Cardiovascular: Normal rate and rhythm. S1,S2 noted.  No murmur, rubs or gallops noted. 1 + pitting BLE edema.  Pulmonary/Chest:  Normal effort and positive vesicular breath sounds. No respiratory distress. No wheezes, rales or ronchi noted.  Abdomen: Soft and nontender. Normal bowel sounds.  Musculoskeletal: Gait not visualized. Neurological: Alert and oriented.  Psychiatric: Mildly anxious appearing.  BMET    Component Value Date/Time   NA 137 08/20/2016 1145   K 3.7 08/20/2016 1145   CL 106 08/20/2016 1145   CO2 24 08/20/2016 1145   GLUCOSE 113 (H) 08/20/2016 1145   BUN 20 08/20/2016 1145   CREATININE 0.72 08/20/2016 1145   CALCIUM 8.6 (L) 08/20/2016 1145   GFRNONAA >60 08/20/2016 1145   GFRAA >60 08/20/2016 1145    Lipid Panel  No results found for: CHOL, TRIG, HDL, CHOLHDL, VLDL, LDLCALC  CBC    Component Value Date/Time   WBC 10.9 (H) 08/20/2016 1145   RBC 3.97 (L) 08/20/2016 1145   HGB 12.4 (L) 08/20/2016 1145   HCT 35.9 (L) 08/20/2016 1145   PLT 346 08/20/2016 1145   MCV 90.4 08/20/2016 1145   MCH 31.2 08/20/2016 1145   MCHC 34.5 08/20/2016 1145   RDW 14.9 (H) 08/20/2016 1145   LYMPHSABS 1.8 08/20/2016 1145   MONOABS 0.7 08/20/2016 1145   EOSABS 0.1 08/20/2016 1145   BASOSABS 0.0 08/20/2016 1145    Hgb A1C No results found for: HGBA1C          Assessment & Plan:

## 2019-10-21 NOTE — Assessment & Plan Note (Signed)
He feels like he is having more issues with gait- will order PT to eval and treat Continue Sinamet

## 2019-10-26 DIAGNOSIS — R262 Difficulty in walking, not elsewhere classified: Secondary | ICD-10-CM | POA: Diagnosis not present

## 2019-10-26 DIAGNOSIS — R2681 Unsteadiness on feet: Secondary | ICD-10-CM | POA: Diagnosis not present

## 2019-10-26 DIAGNOSIS — G20 Parkinson's disease: Secondary | ICD-10-CM | POA: Diagnosis not present

## 2019-10-28 ENCOUNTER — Inpatient Hospital Stay
Admission: EM | Admit: 2019-10-28 | Discharge: 2019-11-02 | DRG: 641 | Disposition: A | Discharge status: Nursing Home (Includes ICF) | Payer: Medicare Other | Source: Skilled Nursing Facility | Attending: Internal Medicine | Admitting: Internal Medicine

## 2019-10-28 ENCOUNTER — Other Ambulatory Visit: Payer: Self-pay

## 2019-10-28 ENCOUNTER — Emergency Department: Payer: Medicare Other

## 2019-10-28 DIAGNOSIS — M25511 Pain in right shoulder: Secondary | ICD-10-CM | POA: Diagnosis not present

## 2019-10-28 DIAGNOSIS — G20B2 Parkinson's disease with dyskinesia, with fluctuations: Secondary | ICD-10-CM | POA: Diagnosis present

## 2019-10-28 DIAGNOSIS — S81812A Laceration without foreign body, left lower leg, initial encounter: Secondary | ICD-10-CM | POA: Diagnosis present

## 2019-10-28 DIAGNOSIS — R0902 Hypoxemia: Secondary | ICD-10-CM | POA: Diagnosis not present

## 2019-10-28 DIAGNOSIS — E861 Hypovolemia: Secondary | ICD-10-CM | POA: Diagnosis present

## 2019-10-28 DIAGNOSIS — S2231XA Fracture of one rib, right side, initial encounter for closed fracture: Secondary | ICD-10-CM

## 2019-10-28 DIAGNOSIS — S4991XA Unspecified injury of right shoulder and upper arm, initial encounter: Secondary | ICD-10-CM | POA: Diagnosis not present

## 2019-10-28 DIAGNOSIS — Z20822 Contact with and (suspected) exposure to covid-19: Secondary | ICD-10-CM | POA: Diagnosis not present

## 2019-10-28 DIAGNOSIS — Y92092 Bedroom in other non-institutional residence as the place of occurrence of the external cause: Secondary | ICD-10-CM | POA: Diagnosis not present

## 2019-10-28 DIAGNOSIS — J45901 Unspecified asthma with (acute) exacerbation: Secondary | ICD-10-CM | POA: Diagnosis present

## 2019-10-28 DIAGNOSIS — E860 Dehydration: Secondary | ICD-10-CM | POA: Diagnosis present

## 2019-10-28 DIAGNOSIS — R531 Weakness: Secondary | ICD-10-CM

## 2019-10-28 DIAGNOSIS — W19XXXA Unspecified fall, initial encounter: Secondary | ICD-10-CM

## 2019-10-28 DIAGNOSIS — W010XXA Fall on same level from slipping, tripping and stumbling without subsequent striking against object, initial encounter: Secondary | ICD-10-CM | POA: Diagnosis present

## 2019-10-28 DIAGNOSIS — R52 Pain, unspecified: Secondary | ICD-10-CM | POA: Diagnosis not present

## 2019-10-28 DIAGNOSIS — I959 Hypotension, unspecified: Secondary | ICD-10-CM | POA: Diagnosis present

## 2019-10-28 DIAGNOSIS — Z7951 Long term (current) use of inhaled steroids: Secondary | ICD-10-CM

## 2019-10-28 DIAGNOSIS — E871 Hypo-osmolality and hyponatremia: Principal | ICD-10-CM

## 2019-10-28 DIAGNOSIS — S2241XA Multiple fractures of ribs, right side, initial encounter for closed fracture: Secondary | ICD-10-CM

## 2019-10-28 DIAGNOSIS — Z79899 Other long term (current) drug therapy: Secondary | ICD-10-CM

## 2019-10-28 DIAGNOSIS — S2241XD Multiple fractures of ribs, right side, subsequent encounter for fracture with routine healing: Secondary | ICD-10-CM | POA: Diagnosis not present

## 2019-10-28 DIAGNOSIS — I10 Essential (primary) hypertension: Secondary | ICD-10-CM | POA: Diagnosis not present

## 2019-10-28 DIAGNOSIS — Z03818 Encounter for observation for suspected exposure to other biological agents ruled out: Secondary | ICD-10-CM | POA: Diagnosis not present

## 2019-10-28 DIAGNOSIS — R4189 Other symptoms and signs involving cognitive functions and awareness: Secondary | ICD-10-CM | POA: Diagnosis not present

## 2019-10-28 DIAGNOSIS — Z66 Do not resuscitate: Secondary | ICD-10-CM | POA: Diagnosis not present

## 2019-10-28 DIAGNOSIS — F39 Unspecified mood [affective] disorder: Secondary | ICD-10-CM | POA: Diagnosis not present

## 2019-10-28 DIAGNOSIS — N400 Enlarged prostate without lower urinary tract symptoms: Secondary | ICD-10-CM | POA: Diagnosis present

## 2019-10-28 DIAGNOSIS — Z7401 Bed confinement status: Secondary | ICD-10-CM | POA: Diagnosis not present

## 2019-10-28 DIAGNOSIS — M25519 Pain in unspecified shoulder: Secondary | ICD-10-CM | POA: Diagnosis not present

## 2019-10-28 DIAGNOSIS — R2681 Unsteadiness on feet: Secondary | ICD-10-CM | POA: Diagnosis not present

## 2019-10-28 DIAGNOSIS — F419 Anxiety disorder, unspecified: Secondary | ICD-10-CM | POA: Diagnosis not present

## 2019-10-28 DIAGNOSIS — R278 Other lack of coordination: Secondary | ICD-10-CM | POA: Diagnosis not present

## 2019-10-28 DIAGNOSIS — K219 Gastro-esophageal reflux disease without esophagitis: Secondary | ICD-10-CM | POA: Diagnosis present

## 2019-10-28 DIAGNOSIS — G20 Parkinson's disease: Secondary | ICD-10-CM | POA: Diagnosis present

## 2019-10-28 DIAGNOSIS — D72829 Elevated white blood cell count, unspecified: Secondary | ICD-10-CM

## 2019-10-28 DIAGNOSIS — W19XXXD Unspecified fall, subsequent encounter: Secondary | ICD-10-CM | POA: Diagnosis not present

## 2019-10-28 DIAGNOSIS — J45909 Unspecified asthma, uncomplicated: Secondary | ICD-10-CM | POA: Diagnosis present

## 2019-10-28 DIAGNOSIS — Z741 Need for assistance with personal care: Secondary | ICD-10-CM | POA: Diagnosis not present

## 2019-10-28 DIAGNOSIS — R262 Difficulty in walking, not elsewhere classified: Secondary | ICD-10-CM | POA: Diagnosis not present

## 2019-10-28 DIAGNOSIS — M6281 Muscle weakness (generalized): Secondary | ICD-10-CM | POA: Diagnosis not present

## 2019-10-28 DIAGNOSIS — M2550 Pain in unspecified joint: Secondary | ICD-10-CM | POA: Diagnosis not present

## 2019-10-28 MED ORDER — ACETAMINOPHEN 500 MG PO TABS
1000.0000 mg | ORAL_TABLET | Freq: Once | ORAL | Status: AC
  Administered 2019-10-29: 1000 mg via ORAL
  Filled 2019-10-28: qty 2

## 2019-10-28 MED ORDER — BACITRACIN ZINC 500 UNIT/GM EX OINT
TOPICAL_OINTMENT | Freq: Two times a day (BID) | CUTANEOUS | Status: DC
  Administered 2019-10-29 (×2): 1 via TOPICAL
  Administered 2019-10-29: 2 via TOPICAL
  Administered 2019-10-30 – 2019-11-02 (×4): 1 via TOPICAL
  Filled 2019-10-28 (×2): qty 0.9
  Filled 2019-10-28: qty 1.8
  Filled 2019-10-28 (×2): qty 0.9
  Filled 2019-10-28: qty 1.8

## 2019-10-28 NOTE — ED Triage Notes (Addendum)
Pt to ED via EMS from [facility] assisted living. Pt statesx he tripped and fell earlier tonight cutting left leg on metal bed frame. Pt c/o right shoulder pain. Pts laceration bandaged by [facility] upon arrival. Per facility the redness to pts bilateral legs is his baseline. Not on blood thinners. Did not hit head  VSS. NAD.

## 2019-10-28 NOTE — ED Notes (Signed)
Pt's daughter at bedisde.

## 2019-10-29 ENCOUNTER — Emergency Department: Payer: Medicare Other

## 2019-10-29 DIAGNOSIS — S2241XA Multiple fractures of ribs, right side, initial encounter for closed fracture: Secondary | ICD-10-CM | POA: Diagnosis not present

## 2019-10-29 DIAGNOSIS — E871 Hypo-osmolality and hyponatremia: Secondary | ICD-10-CM

## 2019-10-29 DIAGNOSIS — R531 Weakness: Secondary | ICD-10-CM

## 2019-10-29 DIAGNOSIS — S2231XA Fracture of one rib, right side, initial encounter for closed fracture: Secondary | ICD-10-CM

## 2019-10-29 DIAGNOSIS — D72829 Elevated white blood cell count, unspecified: Secondary | ICD-10-CM

## 2019-10-29 DIAGNOSIS — W19XXXA Unspecified fall, initial encounter: Secondary | ICD-10-CM

## 2019-10-29 LAB — CBC WITH DIFFERENTIAL/PLATELET
Abs Immature Granulocytes: 0.05 10*3/uL (ref 0.00–0.07)
Basophils Absolute: 0 10*3/uL (ref 0.0–0.1)
Basophils Relative: 0 %
Eosinophils Absolute: 0.1 10*3/uL (ref 0.0–0.5)
Eosinophils Relative: 1 %
HCT: 34.9 % — ABNORMAL LOW (ref 39.0–52.0)
Hemoglobin: 12.2 g/dL — ABNORMAL LOW (ref 13.0–17.0)
Immature Granulocytes: 0 %
Lymphocytes Relative: 12 %
Lymphs Abs: 1.4 10*3/uL (ref 0.7–4.0)
MCH: 30.7 pg (ref 26.0–34.0)
MCHC: 35 g/dL (ref 30.0–36.0)
MCV: 87.9 fL (ref 80.0–100.0)
Monocytes Absolute: 0.9 10*3/uL (ref 0.1–1.0)
Monocytes Relative: 7 %
Neutro Abs: 9.2 10*3/uL — ABNORMAL HIGH (ref 1.7–7.7)
Neutrophils Relative %: 80 %
Platelets: 281 10*3/uL (ref 150–400)
RBC: 3.97 MIL/uL — ABNORMAL LOW (ref 4.22–5.81)
RDW: 13.3 % (ref 11.5–15.5)
WBC: 11.6 10*3/uL — ABNORMAL HIGH (ref 4.0–10.5)
nRBC: 0 % (ref 0.0–0.2)

## 2019-10-29 LAB — BASIC METABOLIC PANEL
Anion gap: 9 (ref 5–15)
Anion gap: 6 (ref 5–15)
BUN: 22 mg/dL (ref 8–23)
BUN: 16 mg/dL (ref 8–23)
CO2: 22 mmol/L (ref 22–32)
CO2: 26 mmol/L (ref 22–32)
Calcium: 8.2 mg/dL — ABNORMAL LOW (ref 8.9–10.3)
Calcium: 8 mg/dL — ABNORMAL LOW (ref 8.9–10.3)
Chloride: 97 mmol/L — ABNORMAL LOW (ref 98–111)
Chloride: 99 mmol/L (ref 98–111)
Creatinine, Ser: 0.55 mg/dL — ABNORMAL LOW (ref 0.61–1.24)
Creatinine, Ser: 0.76 mg/dL (ref 0.61–1.24)
GFR calc Af Amer: >60 mL/min (ref >60)
GFR calc Af Amer: >60 mL/min (ref >60)
GFR calc non Af Amer: >60 mL/min (ref >60)
GFR calc non Af Amer: >60 mL/min (ref >60)
Glucose, Bld: 109 mg/dL — ABNORMAL HIGH (ref 70–99)
Glucose, Bld: 113 mg/dL — ABNORMAL HIGH (ref 70–99)
Potassium: 3.7 mmol/L (ref 3.5–5.1)
Potassium: 4 mmol/L (ref 3.5–5.1)
Sodium: 128 mmol/L — ABNORMAL LOW (ref 135–145)
Sodium: 131 mmol/L — ABNORMAL LOW (ref 135–145)

## 2019-10-29 LAB — URINALYSIS, COMPLETE (UACMP) WITH MICROSCOPIC
Bacteria, UA: NONE SEEN
Bilirubin Urine: NEGATIVE
Glucose, UA: NEGATIVE mg/dL
Hgb urine dipstick: NEGATIVE
Ketones, ur: 5 mg/dL — AB
Leukocytes,Ua: NEGATIVE
Nitrite: NEGATIVE
Protein, ur: NEGATIVE mg/dL
Specific Gravity, Urine: 1.02 (ref 1.005–1.030)
pH: 5 (ref 5.0–8.0)

## 2019-10-29 LAB — SARS CORONAVIRUS 2 BY RT PCR (HOSPITAL ORDER, PERFORMED IN ~~LOC~~ HOSPITAL LAB): SARS Coronavirus 2: NEGATIVE

## 2019-10-29 MED ORDER — LORATADINE 10 MG PO TABS
10.0000 mg | ORAL_TABLET | Freq: Every day | ORAL | Status: DC
  Administered 2019-10-29 – 2019-11-02 (×5): 10 mg via ORAL
  Filled 2019-10-29 (×5): qty 1

## 2019-10-29 MED ORDER — ONDANSETRON HCL 4 MG PO TABS: 4.0000 mg | ORAL_TABLET | ORAL | Status: DC | PRN

## 2019-10-29 MED ORDER — ONDANSETRON HCL 4 MG/2ML IJ SOLN: 4.0000 mg | Freq: Four times a day (QID) | INTRAMUSCULAR | Status: DC | PRN

## 2019-10-29 MED ORDER — PANTOPRAZOLE SODIUM 40 MG PO TBEC
40.0000 mg | DELAYED_RELEASE_TABLET | Freq: Every day | ORAL | Status: DC
  Administered 2019-10-29 – 2019-11-02 (×5): 40 mg via ORAL
  Filled 2019-10-29 (×5): qty 1

## 2019-10-29 MED ORDER — FINASTERIDE 5 MG PO TABS
5.0000 mg | ORAL_TABLET | Freq: Every day | ORAL | Status: DC
  Administered 2019-10-30 – 2019-10-31 (×2): 5 mg via ORAL
  Filled 2019-10-29 (×5): qty 1

## 2019-10-29 MED ORDER — ACETAMINOPHEN 325 MG PO TABS
650.0000 mg | ORAL_TABLET | Freq: Four times a day (QID) | ORAL | Status: DC | PRN
  Filled 2019-10-29: qty 2

## 2019-10-29 MED ORDER — ONDANSETRON HCL 4 MG PO TABS: 4.0000 mg | ORAL_TABLET | Freq: Four times a day (QID) | ORAL | Status: DC | PRN

## 2019-10-29 MED ORDER — OXYCODONE HCL 5 MG PO TABS: 5.0000 mg | ORAL_TABLET | Freq: Once | ORAL | Status: DC

## 2019-10-29 MED ORDER — SODIUM CHLORIDE 1 G PO TABS
1.0000 g | ORAL_TABLET | Freq: Once | ORAL | Status: AC
  Administered 2019-10-29: 1 g via ORAL
  Filled 2019-10-29: qty 1

## 2019-10-29 MED ORDER — ALBUTEROL SULFATE HFA 108 (90 BASE) MCG/ACT IN AERS
2.0000 | INHALATION_SPRAY | Freq: Three times a day (TID) | RESPIRATORY_TRACT | Status: DC | PRN
  Filled 2019-10-29: qty 6.7

## 2019-10-29 MED ORDER — BISMUTH SUBSALICYLATE 262 MG/15ML PO SUSP
10.0000 mL | Freq: Four times a day (QID) | ORAL | Status: DC | PRN
  Filled 2019-10-29: qty 118

## 2019-10-29 MED ORDER — ACETAMINOPHEN 650 MG RE SUPP: 650.0000 mg | Freq: Four times a day (QID) | RECTAL | Status: DC | PRN

## 2019-10-29 MED ORDER — FLUTICASONE PROPIONATE 50 MCG/ACT NA SUSP
2.0000 | Freq: Every day | NASAL | Status: DC
  Administered 2019-10-29 – 2019-11-02 (×5): 2 via NASAL
  Filled 2019-10-29: qty 16

## 2019-10-29 MED ORDER — CITALOPRAM HYDROBROMIDE 20 MG PO TABS
10.0000 mg | ORAL_TABLET | Freq: Every day | ORAL | Status: DC
  Administered 2019-10-30 – 2019-11-01 (×2): 10 mg via ORAL
  Filled 2019-10-29 (×5): qty 1

## 2019-10-29 MED ORDER — SODIUM CHLORIDE 0.9 % IV SOLN: Freq: Once | INTRAVENOUS | Status: AC

## 2019-10-29 MED ORDER — CARBIDOPA-LEVODOPA 25-100 MG PO TABS
1.0000 | ORAL_TABLET | Freq: Three times a day (TID) | ORAL | Status: DC
  Administered 2019-10-29 – 2019-11-02 (×13): 1 via ORAL
  Filled 2019-10-29 (×16): qty 1

## 2019-10-29 MED ORDER — MOMETASONE FURO-FORMOTEROL FUM 200-5 MCG/ACT IN AERO
2.0000 | INHALATION_SPRAY | Freq: Two times a day (BID) | RESPIRATORY_TRACT | Status: DC
  Administered 2019-10-29 – 2019-11-02 (×8): 2 via RESPIRATORY_TRACT
  Filled 2019-10-29: qty 8.8

## 2019-10-29 MED ORDER — MONTELUKAST SODIUM 10 MG PO TABS
10.0000 mg | ORAL_TABLET | Freq: Every day | ORAL | Status: DC
  Administered 2019-10-29 – 2019-11-01 (×4): 10 mg via ORAL
  Filled 2019-10-29 (×5): qty 1

## 2019-10-29 MED ORDER — DEXTROMETHORPHAN-GUAIFENESIN 10-100 MG/5ML PO LIQD
10.0000 mL | ORAL | Status: DC | PRN
  Filled 2019-10-29: qty 10

## 2019-10-29 MED ORDER — TAMSULOSIN HCL 0.4 MG PO CAPS
0.4000 mg | ORAL_CAPSULE | Freq: Every day | ORAL | Status: DC
  Administered 2019-10-29 – 2019-11-01 (×4): 0.4 mg via ORAL
  Filled 2019-10-29 (×5): qty 1

## 2019-10-29 MED ORDER — SPIRONOLACTONE 25 MG PO TABS
25.0000 mg | ORAL_TABLET | Freq: Every day | ORAL | Status: DC
  Administered 2019-10-29 – 2019-10-30 (×2): 25 mg via ORAL
  Filled 2019-10-29 (×3): qty 1

## 2019-10-29 MED ORDER — ENOXAPARIN SODIUM 40 MG/0.4ML ~~LOC~~ SOLN
40.0000 mg | SUBCUTANEOUS | Status: DC
  Administered 2019-10-29 – 2019-11-02 (×5): 40 mg via SUBCUTANEOUS
  Filled 2019-10-29 (×5): qty 0.4

## 2019-10-29 MED ORDER — ACETAMINOPHEN 325 MG PO TABS
650.0000 mg | ORAL_TABLET | Freq: Four times a day (QID) | ORAL | Status: DC | PRN
  Administered 2019-10-29: 650 mg via ORAL

## 2019-10-29 MED ORDER — LIDOCAINE 5 % EX PTCH
1.0000 | MEDICATED_PATCH | CUTANEOUS | Status: DC
  Administered 2019-10-31: 1 via TRANSDERMAL
  Filled 2019-10-29 (×4): qty 1

## 2019-10-29 MED ORDER — HYDROCODONE-ACETAMINOPHEN 5-325 MG PO TABS: 1.0000 | ORAL_TABLET | ORAL | Status: DC | PRN

## 2019-10-29 NOTE — H&P (Signed)
History and Physical    Brandon Calhoun TKW:[id number] DOB: 1929/08/30 DOA: 10/28/2019  PCP: [name], MD   Patient coming from: Assisted living facility I have personally briefly reviewed patient's old medical records in [facility]  Chief Complaint: Fall, weakness  HPI: Brandon Calhoun is a 86 y.o. male with medical history significant for Parkinson's disease on Sinemet, chronic lower extremity venous stasis with balance issues, anxiety, asthma who presents to the emergency room for evaluation of a fall.  Patient states he tripped when he caught his foot on a metal frame of the bed falling with his right shoulder into the closet.  He denied hitting his head.  Had no loss of consciousness.  His main complaint was pain in the right shoulder.  He also complained of generalized weakness ongoing for several weeks but denies one-sided weakness numbness or tingling.  He denied preceding lightheadedness, visual disturbance, chest pain, palpitations or headache.  He stated that he was fine and in his usual state of health until this happened  ED Course: Vitals in the ER were unremarkable except for slightly elevated blood pressure of 177/70.  X-rays revealed posterolateral right fifth and sixth rib fractures.  Patient was about to be discharged from the emergency room however due to complaints of chest pain blood work was done, revealing sodium of 128 and WBC of 11,000 though without source.  Chest x-ray showed no acute intrathoracic process.  Urinalysis and Covid test pending.  Review of Systems: As per HPI otherwise 10 point review of systems negative.    Past Medical History:  Diagnosis Date  . Asthma   . Edema    MILD OF FEET  . GERD (gastroesophageal reflux disease)   . Hypertension   . Mood disorder (HCC)    chronic anxiety    Past Surgical History:  Procedure Laterality Date  . CATARACT EXTRACTION W/PHACO Left 02/16/2016   Procedure: CATARACT EXTRACTION PHACO AND  INTRAOCULAR LENS PLACEMENT (IOC);  Surgeon: Nevada Crane, MD;  Location: [facility] ORS;  Service: Ophthalmology;  Laterality: Left;  [place] 1.10 AP% 10.6 CDE 7.41 Fluid Pack Lot # [id number]  . CATARACT EXTRACTION W/PHACO Right 03/08/2016   Procedure: CATARACT EXTRACTION PHACO AND INTRAOCULAR LENS PLACEMENT (IOC);  Surgeon: Nevada Crane, MD;  Location: [facility] ORS;  Service: Ophthalmology;  Laterality: Right;  [place] 1.14 AP% 11.6 CDE 11.80 Fluid Pack Lot # [id number]  . HERNIA REPAIR    . HIP ARTHROPLASTY Left 07/06/2016   Procedure: ARTHROPLASTY BIPOLAR HIP (HEMIARTHROPLASTY);  Surgeon: Kennedy Bucker, MD;  Location: [facility] ORS;  Service: Orthopedics;  Laterality: Left;     reports that he has never smoked. He has never used smokeless tobacco. He reports that he does not drink alcohol or use drugs.  No Known Allergies  Family History  Problem Relation Age of Onset  . COPD Brother   . Hypertension Neg Hx   . Diabetes Neg Hx   . Heart disease Neg Hx      Prior to Admission medications   Medication Sig Start Date End Date Taking? Authorizing Provider  albuterol (PROVENTIL HFA;VENTOLIN HFA) 108 (90 Base) MCG/ACT inhaler Inhale 2 puffs into the lungs 3 (three) times daily as needed for wheezing or shortness of breath.    [provider]  budesonide-formoterol (SYMBICORT) 160-4.5 MCG/ACT inhaler Inhale 2 puffs into the lungs 2 (two) times daily.    [provider]  carbidopa-levodopa (SINEMET IR) 25-100 MG tablet Take 1 tablet by mouth 3 (  three) times daily. 07/18/17   Venia Carbon, MD  citalopram (CELEXA) 10 MG tablet Take 1 tablet (10 mg total) by mouth daily. 05/06/19   Jearld Fenton, NP  fluticasone (FLONASE) 50 MCG/ACT nasal spray Place 2 sprays into the nose daily.    [provider]  montelukast (SINGULAIR) 10 MG tablet Take 10 mg by mouth at bedtime.    [provider]  omeprazole (PRILOSEC) 20 MG capsule Take 1 capsule by mouth daily.    [provider]  spironolactone (ALDACTONE) 25 MG tablet 25 mg daily. 08/17/18   [provider]  tamsulosin (FLOMAX) 0.4 MG CAPS capsule Take 0.4 mg by mouth.    [provider]    Physical Exam: Vitals:   10/28/19 2241 10/29/19 0200  BP: (!) 177/70 136/70  Pulse: 69 61  Resp: 16   Temp: 98.6 F (37 C)   TempSrc: Oral   SpO2: 97% 96%  Weight: 71.2 kg   Height: 5\' 10"  (1.778 m)      Vitals:   10/28/19 2241 10/29/19 0200  BP: (!) 177/70 136/70  Pulse: 69 61  Resp: 16   Temp: 98.6 F (37 C)   TempSrc: Oral   SpO2: 97% 96%  Weight: 71.2 kg   Height: 5\' 10"  (1.778 m)     Constitutional: Alert and awake, oriented x3, not in any acute distress. Eyes: PERLA, EOMI, irises appear normal, anicteric sclera,  ENMT: external ears and nose appear normal, normal hearing             Lips appears normal, oropharynx mucosa, tongue, posterior pharynx appear normal  Neck: neck appears normal, no masses, normal ROM, no thyromegaly, no JVD  CVS: S1-S2 clear, no murmur rubs or gallops,  , no carotid bruits, pedal pulses palpable, No LE edema Respiratory:  clear to auscultation bilaterally, no wheezing, rales or rhonchi. Respiratory effort normal. No accessory muscle use.  Abdomen: soft nontender, nondistended, normal bowel sounds, no hepatosplenomegaly, no hernias Musculoskeletal: : no cyanosis, clubbing , no contractures or atrophy Neuro: Cranial nerves II-XII intact, sensation, reflexes normal, strength Psych: judgement and insight appear normal, stable mood and affect,  Skin: no rashes or lesions or ulcers, no induration or nodules.   Labs on Admission: I have personally reviewed following labs and imaging studies  CBC: Recent Labs  Lab 10/29/19 0145  WBC 11.6*  NEUTROABS 9.2*  HGB 12.2*  HCT 34.9*  MCV 87.9  PLT 761   Basic Metabolic Panel: Recent Labs  Lab 10/29/19 0145  NA 128*  K 3.7  CL 97*  CO2 22  GLUCOSE 109*  BUN 22  CREATININE 0.55*  CALCIUM  8.2*   GFR: Estimated Creatinine Clearance: 64.3 mL/min (A) (by C-G formula based on SCr of 0.55 mg/dL (L)). Liver Function Tests: No results for input(s): AST, ALT, ALKPHOS, BILITOT, PROT, ALBUMIN in the last 168 hours. No results for input(s): LIPASE, AMYLASE in the last 168 hours. No results for input(s): AMMONIA in the last 168 hours. Coagulation Profile: No results for input(s): INR, PROTIME in the last 168 hours. Cardiac Enzymes: No results for input(s): CKTOTAL, CKMB, CKMBINDEX, TROPONINI in the last 168 hours. BNP (last 3 results) No results for input(s): PROBNP in the last 8760 hours. HbA1C: No results for input(s): HGBA1C in the last 72 hours. CBG: No results for input(s): GLUCAP in the last 168 hours. Lipid Profile: No results for input(s): CHOL, HDL, LDLCALC, TRIG, CHOLHDL, LDLDIRECT in the last 72 hours. Thyroid  Function Tests: No results for input(s): TSH, T4TOTAL, FREET4, T3FREE, THYROIDAB in the last 72 hours. Anemia Panel: No results for input(s): VITAMINB12, FOLATE, FERRITIN, TIBC, IRON, RETICCTPCT in the last 72 hours. Urine analysis:    Component Value Date/Time   COLORURINE YELLOW (A) 08/20/2016 1145   APPEARANCEUR CLEAR (A) 08/20/2016 1145   LABSPEC 1.017 08/20/2016 1145   PHURINE 7.0 08/20/2016 1145   GLUCOSEU NEGATIVE 08/20/2016 1145   HGBUR NEGATIVE 08/20/2016 1145   BILIRUBINUR NEGATIVE 08/20/2016 1145   KETONESUR NEGATIVE 08/20/2016 1145   PROTEINUR NEGATIVE 08/20/2016 1145   NITRITE NEGATIVE 08/20/2016 1145   LEUKOCYTESUR NEGATIVE 08/20/2016 1145    Radiological Exams on Admission: DG Chest 2 View  Result Date: 10/29/2019 CLINICAL DATA:  Tripped and fell, right rib fracture, right shoulder pain EXAM: CHEST - 2 VIEW COMPARISON:  07/05/2016 FINDINGS: Frontal and lateral views of the chest demonstrate an unremarkable cardiac silhouette. No airspace disease, effusion, or pneumothorax. There are right posterolateral fifth and sixth rib fractures.  IMPRESSION: 1. Right posterolateral fifth and sixth rib fractures. 2. Otherwise no acute intrathoracic process. Electronically Signed   By: Sharlet Salina M.D.   On: 10/29/2019 00:48   DG Shoulder Right  Result Date: 10/29/2019 CLINICAL DATA:  Fall and pain EXAM: RIGHT SHOULDER - 2+ VIEW COMPARISON:  None. FINDINGS: There is diffuse osteopenia. A high-riding humeral head is noted advanced glenohumeral joint osteoarthritis seen with joint space loss. There is a probable nondisplaced fracture seen of the lateral fourth right rib. AC joint arthrosis is noted. No focal soft tissue swelling is seen. IMPRESSION: Nondisplaced lateral fourth right rib fracture. Advanced glenohumeral joint osteoarthritis and a high-riding humeral head. Electronically Signed   By: Jonna Clark M.D.   On: 10/29/2019 00:14   DG Tibia/Fibula Left  Result Date: 10/29/2019 CLINICAL DATA:  Tripped and fell, lacerations EXAM: LEFT TIBIA AND FIBULA - 2 VIEW COMPARISON:  08/20/2016 FINDINGS: Frontal and lateral views of the left tibia and fibula are obtained. No acute displaced fracture. Alignment of the left knee and ankle is anatomic. Mild diffuse subcutaneous edema. No radiopaque foreign bodies. IMPRESSION: 1. Mild subcutaneous edema.  No acute bony abnormality. Electronically Signed   By: Sharlet Salina M.D.   On: 10/29/2019 00:11    EKG: Independently reviewed.   Assessment/Plan Principal Problem:  Multiple fractures of ribs, right side, initial encounter for closed fracture Accidental fall -Patient presents with what appears to be a mechanical fall when he tripped on the bedpost falling with his right side into the closet -X-ray showing fifth and sixth posterolateral right rib fractures -Physical therapy evaluation -Pain control  Generalized weakness -Suspect related to general deconditioning as well as hyponatremia -Correct hyponatremia, outlined below -PT evaluation    Hyponatremia  -Mild hyponatremia of 128,  suspect SIADH related to citalopram -Salt tablets x1 and monitor sodium    Leukocytosis -No obvious source of infection and no stigmata of infection -Follow UA and Covid test.  Chest x-ray was negative  Anxiety -Continue Celexa    Asthma -DuoNebs as needed    Parkinson's disease (HCC) -Continue Sinemet pending med rec    DVT prophylaxis: Lovenox  Code Status: full code  Family Communication:  none  Disposition Plan: Back to previous home environment Consults called: none  Status:obs    Andris Baumann MD [facility]     10/29/2019, 2:43 AM

## 2019-10-29 NOTE — Discharge Instructions (Signed)
Use the incentive spirometer once every hour while awake.  Take 1000 mg of Tylenol 3 times a day for the next 2 to 3 days for pain control.  Keep the wound in the leg dry and clean.  Is okay to wash with warm water and soap and apply topical bacitracin.  Return to the emergency room for chest pain, shortness of breath, cough, fever, redness or warmth surrounding the skin abrasion site or purulent discharge.

## 2019-10-29 NOTE — Progress Notes (Signed)
Patient ID: Brandon Calhoun, male   DOB: September 12, 1929, 86 y.o.   MRN: [id number]   Patient seen and examined in the ER. Daughter in the room. Patient came in from [facility] assisted living. He had an accidental fall where he landed in his closet and injuring his ribs with a walker which was in front of him before he fell.  Workup in the ER showed couple rib fractures. No acute head injury. Patient did not lose consciousness.  Overall hemodynamically stable. He was noted to have low sodium and mild low chloride. I did give IV fluids and labs look better.  Physical therapy will see patient. Plan is to send patient to [facility] rehab once insurance authorization is obtained.  This was discussed with ER social worker, patient's daughter.  Patient is DNR prior to admission.  No charge

## 2019-10-29 NOTE — ED Notes (Signed)
Message sent to pharmacy for missing meds.  Daughter at bedside, pt resting. Pt ate 90% of breakfast.

## 2019-10-29 NOTE — TOC Progression Note (Signed)
Transition of Care [facility]) - Progression Note    Patient Details  Name: Brandon Calhoun MRN: [id number] Date of Birth: [birth year]  Transition of Care [facility]) CM/SW Contact  Marina Goodell Phone Number: [phone] 10/29/2019, 3:29 PM  Clinical Narrative:     This CSW spoke w/ Dr. Allena Katz, attending about patient disposition.  Patient will be seen by PT for consult, and this CSW will begin insurance authorization once PT note is entered.  [employer] will require PT note for verification of need.  Patient will return to [facility] once authorization is finalized, possible 24-48 hour wait.  This CSW spoke w/ patient's daughter Darl Pikes and updated her on insurance authorization process and gave her estimated disposition time line.  Darl Pikes verbalized understanding.  This CSW informed Darl Pikes, I would keep her updated.  This CSW informed Dr. Allena Katz of authorization time line.        Expected Discharge Plan and Services                                                 Social Determinants of Health (SDOH) Interventions    Readmission Risk Interventions No flowsheet data found.

## 2019-10-29 NOTE — ED Notes (Signed)
Pt with pt

## 2019-10-29 NOTE — ED Notes (Signed)
Pt resting comfortably at this time, call bell within reach, stretcher locked in lowest position. Denies any needs at this time.

## 2019-10-29 NOTE — TOC Initial Note (Signed)
Transition of Care [facility]) - Initial/Assessment Note    Patient Details  Name: Brandon Calhoun MRN: [id number] Date of Birth: 1930/04/19  [facility]) CM/SW Contact:    Marina Goodell Phone Number: [phone] 10/29/2019, 10:26 AM  Clinical Narrative:                  This CSW received a call from [facility] RN/Ed stating the patient's daughter Darl Pikes [phone], wanted to take the patient back to [facility] for recovery.  The patient's current disposition is for admission.  This CSW spoke w/ Sue Lush at [facility] and she stated they support whatever the recommendations are from the MD. This CSw spoke w/ Darl Pikes and she stated the recommendation for inpatient care was from last night and the patient is doing much better today and that they are waiting for a PT consult to determine if the patient will need SNF placement.  While speaking w/ Darl Pikes the psychian came to see the patient. This CSW will continue to follow this patient for a disposition update.        Patient Goals and CMS Choice        Expected Discharge Plan and Services                                                Prior Living Arrangements/Services                       Activities of Daily Living      Permission Sought/Granted                  Emotional Assessment              Admission diagnosis:  Generalized weakness [R53.1] Patient Active Problem List   Diagnosis Date Noted  . Hyponatremia 10/29/2019  . Leukocytosis 10/29/2019  . Accidental fall 10/29/2019  . Multiple fractures of ribs, right side, initial encounter for closed fracture 10/29/2019  . Generalized weakness 10/29/2019  . Venous insufficiency of both lower extremities 07/03/2018  . Parkinson's disease (HCC) 01/03/2017  . 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 GERD (gastroesophageal reflux disease)   . Asthma   . Mood disorder (HCC)    PCP:  [name],  MD Pharmacy:   [facility], [place] - [address] [place] [address] Phone: [phone] Fax: [phone]     Social Determinants of Health (SDOH) Interventions    Readmission Risk Interventions No flowsheet data found.

## 2019-10-29 NOTE — Evaluation (Signed)
Physical Therapy Evaluation Patient Details Name: Brandon Calhoun MRN: [id number] DOB: [birth year] Today's Date: 10/29/2019   History of Present Illness  Pt is an 86 y.o. male presenting to hospital 5/12 s/p fall (tripped after catching leg on metal frame of bed); c/o R shoulder pain and sustained skin tear L LE.  Imaging showing nondisplaced lateral 4th rib fx and R posterolateral 5th and 6th rib fx's.  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  Clinical Impression  Prior to hospital admission, pt was modified independent ambulating with RW (except used rollator outdoors); lives at [facility].  Currently pt is mod to max assist semi-supine to sitting edge of bed; CGA to min assist x2 for transfers; and CGA to min assist x2 to ambulate 20 feet with RW.  Limited distance ambulating d/t pt appearing shaky/unsteady and safety concerns d/t pt intermittently closing his eyes while walking.  Pt reporting mild lightheadedness in standing (BP 151/65; HR 66 bpm; O2 95% on room air).  Pt very limited with AROM R UE which impaired pt's ability to assist with functional mobility (able to get to 70-80 degrees R shoulder flexion PROM but overall ROM limited d/t R shoulder pain).  Pt would benefit from skilled PT to address noted impairments and functional limitations (see below for any additional details).  Upon hospital discharge, pt would benefit from STR.    Follow Up Recommendations SNF    Equipment Recommendations  Rolling walker with 5" wheels;3in1 (PT)    Recommendations for Other Services OT consult     Precautions / Restrictions Precautions Precautions: Fall Precaution Comments: rib fx's Restrictions Weight Bearing Restrictions: No      Mobility  Bed Mobility Overal bed mobility: Needs Assistance Bed Mobility: Supine to Sit;Sit to Supine     Supine to sit: Mod assist;Max assist;HOB elevated Sit to supine: +2 for physical  assistance   General bed mobility comments: assist for trunk and B LE's semi-supine to sitting edge of bed; 2 assist sit to semi-supine for repositioning and comfort  Transfers Overall transfer level: Needs assistance Equipment used: Rolling walker (2 wheeled) Transfers: Sit to/from Stand Sit to Stand: Min assist;Min guard;+2 physical assistance         General transfer comment: assist to initiate stand up to walker and for initial standing balance  Ambulation/Gait Ambulation/Gait assistance: Min guard;Min assist;+2 physical assistance Gait Distance (Feet): 20 Feet Assistive device: Rolling walker (2 wheeled)   Gait velocity: decreased   General Gait Details: decreased B LE step length/foot clearance/heelstrike; appearing shaky/unsteady requiring assist for safety and balance  Stairs            Wheelchair Mobility    Modified Rankin (Stroke Patients Only)       Balance Overall balance assessment: Needs assistance Sitting-balance support: No upper extremity supported;Feet supported Sitting balance-Leahy Scale: Good Sitting balance - Comments: steady sitting reaching within BOS   Standing balance support: No upper extremity supported Standing balance-Leahy Scale: Fair Standing balance comment: pt shaky standing in place (no UE support) requiring CGA for safety                             Pertinent Vitals/Pain Pain Assessment: No/denies pain  HR and O2 on room air stable and WFL throughout treatment session.    Home Living Family/patient expects to be discharged to:: Assisted living  Home Equipment: Walker - 2 wheels;Walker - 4 wheels Additional Comments: [facility] (2nd floor apt; takes elevator to get to dining room)    Prior Function Level of Independence: Needs assistance   Gait / Transfers Assistance Needed: Ambulates with RW most of the time but uses rollator outside.  1 other fall (stumbled over feet) in last 6  months.  ADL's / Homemaking Assistance Needed: Assist for dressing, laundry, medications, and showers.        Hand Dominance        Extremity/Trunk Assessment   Upper Extremity Assessment Upper Extremity Assessment: RUE deficits/detail;LUE deficits/detail RUE Deficits / Details: good hand grip strength; at least 3/5 AROM elbow flexion/extension; shoulder flexion AROM to grossly 20 degrees (limited d/t R shoulder pain) but PROM to grossly 70-80 degrees (limited d/t R shoulder pain) RUE: Unable to fully assess due to pain LUE Deficits / Details: good hand grip strength; at least 3/5 AROM elbow flexion/extension and shoulder flexion to grossly 130 degrees    Lower Extremity Assessment Lower Extremity Assessment: RLE deficits/detail;LLE deficits/detail RLE Deficits / Details: at least 3/5 AROM hip flexion, knee flexion/extension, and DF/PF LLE Deficits / Details: at least 3/5 AROM hip flexion, knee flexion/extension, and DF/PF    Cervical / Trunk Assessment Cervical / Trunk Assessment: (forward shoulders/head)  Communication   Communication: No difficulties  Cognition Arousal/Alertness: Awake/alert Behavior During Therapy: WFL for tasks assessed/performed Overall Cognitive Status: Within Functional Limits for tasks assessed                                 General Comments: mild increased time to respond and initiate movement at times      General Comments General comments (skin integrity, edema, etc.): bandage noted L LE.  Nursing cleared pt for participation in physical therapy and pre-medicated pt with Tylenol for PT session.  Therapist attempted to see pt earlier this afternoon but pt needing toileting assist with nursing staff so therapist returned later.  Pt agreeable to PT session.  Pt's daughter present during PT session.    Exercises     Assessment/Plan    PT Assessment Patient needs continued PT services  PT Problem List Decreased strength;Decreased  range of motion;Decreased activity tolerance;Decreased balance;Decreased mobility;Decreased knowledge of use of DME;Decreased knowledge of precautions;Pain;Decreased skin integrity       PT Treatment Interventions DME instruction;Gait training;Functional mobility training;Therapeutic activities;Therapeutic exercise;Balance training;Patient/family education    PT Goals (Current goals can be found in the Care Plan section)  Acute Rehab PT Goals Patient Stated Goal: to improve mobility PT Goal Formulation: With patient/family Time For Goal Achievement: 11/12/19 Potential to Achieve Goals: Good    Frequency Min 2X/week   Barriers to discharge Decreased caregiver support      Co-evaluation               AM-PAC PT "6 Clicks" Mobility  Outcome Measure Help needed turning from your back to your side while in a flat bed without using bedrails?: A Little Help needed moving from lying on your back to sitting on the side of a flat bed without using bedrails?: A Lot Help needed moving to and from a bed to a chair (including a wheelchair)?: A Little Help needed standing up from a chair using your arms (e.g., wheelchair or bedside chair)?: A Little Help needed to walk in hospital room?: A Little Help needed climbing 3-5 steps with a railing? :  A Lot 6 Click Score: 16    End of Session Equipment Utilized During Treatment: Gait belt Activity Tolerance: Patient tolerated treatment well Patient left: in bed;with call bell/phone within reach;with family/visitor present Nurse Communication: Mobility status;Precautions PT Visit Diagnosis: Unsteadiness on feet (R26.81);Other abnormalities of gait and mobility (R26.89);Muscle weakness (generalized) (M62.81);History of falling (Z91.81);Difficulty in walking, not elsewhere classified (R26.2);Pain Pain - Right/Left: Right Pain - part of body: Shoulder    Time: 1610-9604 PT Time Calculation (min) (ACUTE ONLY): 36 min   Charges:   PT  Evaluation $PT Eval Low Complexity: 1 Low PT Treatments $Therapeutic Activity: 8-22 mins        Hendricks Limes, PT 10/29/19, 4:28 PM

## 2019-10-29 NOTE — ED Notes (Signed)
RN attempted to assist pat out of bed to ambulate with walker. Pt lives in apartment and has assistance during the day but not at night. Pt was unable to sit up in bed and unable to pull self up with right arm.

## 2019-10-29 NOTE — ED Notes (Signed)
Gave lunch tray with juice. 

## 2019-10-29 NOTE — ED Notes (Signed)
Tech assisted pt with urinal.

## 2019-10-29 NOTE — ED Notes (Signed)
Pt had to use urinal with assistance. Adjusted pt and told family they could come back in room.

## 2019-10-29 NOTE — ED Notes (Signed)
Pt had to use bedpan. Pt had a bowel movement we cleaned pt up, and put new bedding and a gown on.

## 2019-10-29 NOTE — ED Notes (Signed)
Assisted pt with using the urinal.

## 2019-10-29 NOTE — ED Provider Notes (Signed)
[facility] Emergency Department Provider Note  ____________________________________________  Time seen: Approximately 12:02 AM  I have reviewed the triage vital signs and the nursing notes.   HISTORY  Chief Complaint Fall and Laceration   HPI Brandon Calhoun is a 86 y.o. male with a history of Parkinson's disease, asthma, hypertension, venous insufficiency, BPH who presents for evaluation of a fall.  Patient lives at [facility] assisted living.  He tripped earlier today and caught his leg on a metal frame of the bed.  He fell into his closet.  He denies head trauma or LOC.  Is not on blood thinners.  He is not a diabetic.  Tetanus shot was last given in 2015.  Is complaining of right shoulder pain.  He has history of chronic stiffness of the shoulder but the pain is new.  His pain is moderate in intensity and worse with movement of the shoulder.  He denies any leg pain but does have a skin tear on the left lower extremity.  No back pain or neck pain, no hip pain, no knee pain, no abdominal pain or chest pain.  The fall was mechanical in nature.  Past Medical History:  Diagnosis Date  . Asthma   . Edema    MILD OF FEET  . GERD (gastroesophageal reflux disease)   . Hypertension   . Mood disorder (HCC)    chronic anxiety    Patient Active Problem List   Diagnosis Date Noted  . Hyponatremia 10/29/2019  . Leukocytosis 10/29/2019  . Accidental fall 10/29/2019  . Multiple fractures of ribs, right side, initial encounter for closed fracture 10/29/2019  . Generalized weakness 10/29/2019  . Venous insufficiency of both lower extremities 07/03/2018  . Parkinson's disease (HCC) 01/03/2017  . 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 GERD (gastroesophageal reflux disease)   . Asthma   . Mood disorder [facility])     Past Surgical History:  Procedure Laterality Date  . CATARACT EXTRACTION W/PHACO Left 02/16/2016   Procedure: CATARACT  EXTRACTION PHACO AND INTRAOCULAR LENS PLACEMENT (IOC);  Surgeon: Nevada Crane, MD;  Location: [facility] ORS;  Service: Ophthalmology;  Laterality: Left;  [place] 1.10 AP% 10.6 CDE 7.41 Fluid Pack Lot # [id number]  . CATARACT EXTRACTION W/PHACO Right 03/08/2016   Procedure: CATARACT EXTRACTION PHACO AND INTRAOCULAR LENS PLACEMENT (IOC);  Surgeon: Nevada Crane, MD;  Location: [facility] ORS;  Service: Ophthalmology;  Laterality: Right;  [place] 1.14 AP% 11.6 CDE 11.80 Fluid Pack Lot # [id number]  . HERNIA REPAIR    . HIP ARTHROPLASTY Left 07/06/2016   Procedure: ARTHROPLASTY BIPOLAR HIP (HEMIARTHROPLASTY);  Surgeon: Kennedy Bucker, MD;  Location: [facility] ORS;  Service: Orthopedics;  Laterality: Left;    Prior to Admission medications   Medication Sig Start Date End Date Taking? Authorizing Provider  albuterol (PROVENTIL HFA;VENTOLIN HFA) 108 (90 Base) MCG/ACT inhaler Inhale 2 puffs into the lungs 3 (three) times daily as needed for wheezing or shortness of breath.    [provider]  budesonide-formoterol (SYMBICORT) 160-4.5 MCG/ACT inhaler Inhale 2 puffs into the lungs 2 (two) times daily.    [provider]  carbidopa-levodopa (SINEMET IR) 25-100 MG tablet Take 1 tablet by mouth 3 (three) times daily. 07/18/17   [name], MD  citalopram (CELEXA) 10 MG tablet Take 1 tablet (10 mg total) by mouth daily. 05/06/19   Lorre Munroe, NP  fluticasone (FLONASE) 50 MCG/ACT nasal spray Place 2 sprays into the nose daily.  [provider]  montelukast (SINGULAIR) 10 MG tablet Take 10 mg by mouth at bedtime.    [provider]  omeprazole (PRILOSEC) 20 MG capsule Take 1 capsule by mouth daily.    [provider]  spironolactone (ALDACTONE) 25 MG tablet 25 mg daily. 08/17/18   [provider]  tamsulosin (FLOMAX) 0.4 MG CAPS capsule Take 0.4 mg by mouth.    [provider]    Allergies Patient has no known allergies.  Family History  Problem  Relation Age of Onset  . COPD Brother   . Hypertension Neg Hx   . Diabetes Neg Hx   . Heart disease Neg Hx     Social History Social History   Tobacco Use  . Smoking status: Never Smoker  . Smokeless tobacco: Never Used  Substance Use Topics  . Alcohol use: No  . Drug use: No    Review of Systems  Constitutional: Negative for fever. Eyes: Negative for visual changes. ENT: Negative for facial injury or neck injury Cardiovascular: Negative for chest injury. Respiratory: Negative for shortness of breath. Negative for chest wall injury. Gastrointestinal: Negative for abdominal pain or injury. Genitourinary: Negative for dysuria. Musculoskeletal: Negative for back injury, + R shoulder pain Skin: + LLE skin tear Neurological: Negative for head injury.   ____________________________________________   PHYSICAL EXAM:  VITAL SIGNS: ED Triage Vitals [10/28/19 2241]  Enc Vitals Group     BP (!) 177/70     Pulse Rate 69     Resp 16     Temp 98.6 F (37 C)     Temp Source Oral     SpO2 97 %     Weight 157 lb (71.2 kg)     Height 5\' 10"  (1.778 m)     Head Circumference      Peak Flow      Pain Score 0     Pain Loc      Pain Edu?      Excl. in GC?     Full spinal precautions maintained throughout the trauma exam. Constitutional: Alert and oriented. No acute distress. Does not appear intoxicated. HEENT Head: Normocephalic and atraumatic. Face: No facial bony tenderness. Stable midface Ears: No hemotympanum bilaterally. No Battle sign Eyes: No eye injury. PERRL. No raccoon eyes Nose: Nontender. No epistaxis. No rhinorrhea Mouth/Throat: Mucous membranes are moist. No oropharyngeal blood. No dental injury. Airway patent without stridor. Normal voice. Neck: no C-collar. No midline c-spine tenderness.  Cardiovascular: Normal rate, regular rhythm. Normal and symmetric distal pulses are present in all extremities. Pulmonary/Chest: Chest wall is stable and nontender to  palpation/compression. Normal respiratory effort. Breath sounds are normal. No crepitus.  Abdominal: Soft, nontender, non distended. Musculoskeletal: Tenderness to palpation in the posterior aspect of the right shoulder with no obvious deformities.  Nontender with normal full range of motion in all extremities. No deformities. No thoracic or lumbar midline spinal tenderness. Pelvis is stable. Skin: Skin is warm, dry and intact.  Skin tear on the left lower extremity Psychiatric: Speech and behavior are appropriate. Neurological: Normal speech and language. Moves all extremities to command. No gross focal neurologic deficits are appreciated.  Glascow Coma Score: 4 - Opens eyes on own 6 - Follows simple motor commands 5 - Alert and oriented GCS: 15   ____________________________________________   LABS (all labs ordered are listed, but only abnormal results are displayed)  Labs Reviewed  CBC WITH DIFFERENTIAL/PLATELET - Abnormal; Notable for the following components:  Result Value   WBC 11.6 (*)    RBC 3.97 (*)    Hemoglobin 12.2 (*)    HCT 34.9 (*)    Neutro Abs 9.2 (*)    All other components within normal limits  BASIC METABOLIC PANEL - Abnormal; Notable for the following components:   Sodium 128 (*)    Chloride 97 (*)    Glucose, Bld 109 (*)    Creatinine, Ser 0.55 (*)    Calcium 8.2 (*)    All other components within normal limits  SARS CORONAVIRUS 2 BY RT PCR (HOSPITAL ORDER, [place] [facility])  URINALYSIS, COMPLETE (UACMP) WITH MICROSCOPIC   ____________________________________________  EKG  ED ECG REPORT I, Rudene Re, the attending physician, personally viewed and interpreted this ECG.  Sinus bradycardia, rate of 59, first-degree AV block, normal QTC, left axis deviation, right bundle branch block, LAFB, LVH, no ST elevations or depressions.  No significant changes when compared to  prior.  ____________________________________________  RADIOLOGY  I have personally reviewed the images performed during this visit and I agree with the Radiologist's read.   Interpretation by Radiologist:  DG Chest 2 View  Result Date: 10/29/2019 CLINICAL DATA:  Tripped and fell, right rib fracture, right shoulder pain EXAM: CHEST - 2 VIEW COMPARISON:  07/05/2016 FINDINGS: Frontal and lateral views of the chest demonstrate an unremarkable cardiac silhouette. No airspace disease, effusion, or pneumothorax. There are right posterolateral fifth and sixth rib fractures. IMPRESSION: 1. Right posterolateral fifth and sixth rib fractures. 2. Otherwise no acute intrathoracic process. Electronically Signed   By: Randa Ngo M.D.   On: 10/29/2019 00:48   DG Shoulder Right  Result Date: 10/29/2019 CLINICAL DATA:  Fall and pain EXAM: RIGHT SHOULDER - 2+ VIEW COMPARISON:  None. FINDINGS: There is diffuse osteopenia. A high-riding humeral head is noted advanced glenohumeral joint osteoarthritis seen with joint space loss. There is a probable nondisplaced fracture seen of the lateral fourth right rib. AC joint arthrosis is noted. No focal soft tissue swelling is seen. IMPRESSION: Nondisplaced lateral fourth right rib fracture. Advanced glenohumeral joint osteoarthritis and a high-riding humeral head. Electronically Signed   By: Prudencio Pair M.D.   On: 10/29/2019 00:14   DG Tibia/Fibula Left  Result Date: 10/29/2019 CLINICAL DATA:  Tripped and fell, lacerations EXAM: LEFT TIBIA AND FIBULA - 2 VIEW COMPARISON:  08/20/2016 FINDINGS: Frontal and lateral views of the left tibia and fibula are obtained. No acute displaced fracture. Alignment of the left knee and ankle is anatomic. Mild diffuse subcutaneous edema. No radiopaque foreign bodies. IMPRESSION: 1. Mild subcutaneous edema.  No acute bony abnormality. Electronically Signed   By: Randa Ngo M.D.   On: 10/29/2019 00:11       ____________________________________________   PROCEDURES  Procedure(s) performed: None Procedures Critical Care performed:  None ____________________________________________   INITIAL IMPRESSION / ASSESSMENT AND PLAN / ED COURSE  86 y.o. male with a history of Parkinson's disease, asthma, hypertension, venous insufficiency, BPH who presents for evaluation of a fall.  Patient sustained a skin tear of his left lower extremity which was washed thoroughly and dressed with bacitracin and Xeroform.  Discussed wound care and follow-up for any signs of infection with patient and his daughter who was at bedside.  Also complaining of right shoulder pain.  X-rays of the leg visualized by me with no acute fractures which is confirmed by radiology.  Shoulder x-ray showing arthritic changes but no shoulder dislocation or fracture, there is a nondisplaced  rib fracture seen on that x-ray which is confirmed by radiology.  Chest x-ray was then obtained which showed 2 nondisplaced rib fractures.  Tylenol and Lidoderm patch given for pain.  Pain is well controlled however patient is extremely weak and unable to stand and ambulate even with assistance. He currently lives in an independent facility and cannot be sent back tonight. Will consult Hospitalist for admission.  _________________________ 2:59 AM on 10/29/2019 -----------------------------------------  EKG with no dysrhythmia or ischemia.  Labs showing mild hyponatremia for which she will be given sodium tabs.  No UA, no anemia, no other electrolyte derangements.  Patient admitted to the hospitalist service.    ____________________________________________  Please note:  Patient was evaluated in Emergency Department today for the symptoms described in the history of present illness. Patient was evaluated in the context of the global COVID-19 pandemic, which necessitated consideration that the patient might be at risk for infection with the SARS-CoV-2  virus that causes COVID-19. Institutional protocols and algorithms that pertain to the evaluation of patients at risk for COVID-19 are in a state of rapid change based on information released by regulatory bodies including the CDC and federal and state organizations. These policies and algorithms were followed during the patient's care in the ED.  Some ED evaluations and interventions may be delayed as a result of limited staffing during the pandemic.   ____________________________________________   FINAL CLINICAL IMPRESSION(S) / ED DIAGNOSES   Final diagnoses:  Fall, initial encounter  Skin tear of left lower leg without complication, initial encounter  Closed fracture of multiple ribs of right side, initial encounter  Hyponatremia      NEW MEDICATIONS STARTED DURING THIS VISIT:  ED Discharge Orders    None       Note:  This document was prepared using Dragon voice recognition software and may include unintentional dictation errors.    Don Perking, [place], MD 10/29/19 [phone]

## 2019-10-29 NOTE — ED Notes (Signed)
Spoke with PT about PT consult. Pt will notify prior to coming so pt can have pain meds. Pt requests Tylenol for pain medication prior to PT eval.

## 2019-10-30 ENCOUNTER — Other Ambulatory Visit: Payer: Self-pay

## 2019-10-30 DIAGNOSIS — S2241XA Multiple fractures of ribs, right side, initial encounter for closed fracture: Secondary | ICD-10-CM | POA: Diagnosis not present

## 2019-10-30 DIAGNOSIS — R531 Weakness: Secondary | ICD-10-CM

## 2019-10-30 DIAGNOSIS — E871 Hypo-osmolality and hyponatremia: Secondary | ICD-10-CM | POA: Diagnosis not present

## 2019-10-30 DIAGNOSIS — G20 Parkinson's disease: Secondary | ICD-10-CM

## 2019-10-30 DIAGNOSIS — W19XXXD Unspecified fall, subsequent encounter: Secondary | ICD-10-CM | POA: Diagnosis not present

## 2019-10-30 LAB — CBC
HCT: 37.4 % — ABNORMAL LOW (ref 39.0–52.0)
Hemoglobin: 13.5 g/dL (ref 13.0–17.0)
MCH: 31.1 pg (ref 26.0–34.0)
MCHC: 36.1 g/dL — ABNORMAL HIGH (ref 30.0–36.0)
MCV: 86.2 fL (ref 80.0–100.0)
Platelets: 298 10*3/uL (ref 150–400)
RBC: 4.34 MIL/uL (ref 4.22–5.81)
RDW: 13.8 % (ref 11.5–15.5)
WBC: 10 10*3/uL (ref 4.0–10.5)
nRBC: 0 % (ref 0.0–0.2)

## 2019-10-30 LAB — BASIC METABOLIC PANEL
Anion gap: 7 (ref 5–15)
BUN: 14 mg/dL (ref 8–23)
CO2: 23 mmol/L (ref 22–32)
Calcium: 8.2 mg/dL — ABNORMAL LOW (ref 8.9–10.3)
Chloride: 98 mmol/L (ref 98–111)
Creatinine, Ser: 0.52 mg/dL — ABNORMAL LOW (ref 0.61–1.24)
GFR calc Af Amer: >60 mL/min (ref >60)
GFR calc non Af Amer: >60 mL/min (ref >60)
Glucose, Bld: 123 mg/dL — ABNORMAL HIGH (ref 70–99)
Potassium: 3.7 mmol/L (ref 3.5–5.1)
Sodium: 128 mmol/L — ABNORMAL LOW (ref 135–145)

## 2019-10-30 LAB — MRSA PCR SCREENING: MRSA by PCR: NEGATIVE

## 2019-10-30 MED ORDER — SODIUM CHLORIDE 1 G PO TABS
1.0000 g | ORAL_TABLET | Freq: Two times a day (BID) | ORAL | Status: DC
  Administered 2019-10-30 – 2019-11-01 (×5): 1 g via ORAL
  Filled 2019-10-30 (×7): qty 1

## 2019-10-30 MED ORDER — SODIUM CHLORIDE 0.9 % IV SOLN: INTRAVENOUS | Status: DC

## 2019-10-30 NOTE — TOC Progression Note (Signed)
Transition of Care [facility]) - Progression Note    Patient Details  Name: Brandon Calhoun MRN: [id number] Date of Birth: 12-09-1929  Transition of Care [facility]) CM/SW Contact  Brandon Calhoun Phone Number: [phone] 10/30/2019, 11:16 AM  Clinical Narrative:     Patient pending placement to [facility], waiting for [employer] authorization.  This CSW spoke w/ patient's daughter Brandon Calhoun [phone].  She was concerned that the patient has not received breakfast and had not eaten since 5:30PM on 10/29/2019.  Brandon Calhoun was also upset because she called the insurance company and was told the authorization had not been sent yet.  This CSW assured Brandon Calhoun the authorization has been sent and I gave her the name of the representative who I sent the information to in case she wanted to touch base w/ her.  I also reminded Brandon Calhoun that I informed her yesterday the [employer] process could take 24-48 hours.  Brandon Calhoun stated her verbal understanding.  Brandon Calhoun stated she was concerned about the patient's lack of movement, and lack of change of sheets.  This CSW informed Brandon Calhoun the patient was in the ED and that typically physical therapy is not done in the ED.  Brandon Calhoun verbalized her understanding but she would like for her father be assisted in getting up and moved.  This CSw stated that I would reach out to the ED/RN about her concerns.  This CSW updated EDP and ED Nurse about Brandon Calhoun's concerns.  ED/RN stated she took cereal, jiuce and milk to patient's room. This CSw reach out to EDP about Brandon Calhoun's concerns on patient movement.   Expected Discharge Plan: Skilled Nursing Facility Barriers to Discharge: ED SNF auth  Expected Discharge Plan and Services Expected Discharge Plan: Skilled Nursing Facility In-house Referral: Clinical Social Work   Post Acute Care Choice: Skilled Nursing Facility Living arrangements for the past 2 months: Assisted Living Facility             DME Arranged: 3-N-1, Environmental consultant rolling(rolling walker w/ 5" wheels) DME Agency: Other - Comment([employer] - Brandon Calhoun [phone]) Date DME Agency Contacted: 10/30/19 Time DME Agency Contacted: [phone] Representative spoke with at DME Agency: Brandon Calhoun             Social Determinants of Health (SDOH) Interventions    Readmission Risk Interventions No flowsheet data found.

## 2019-10-30 NOTE — TOC Progression Note (Addendum)
Transition of Care [facility]) - Progression Note    Patient Details  Name: Brandon Calhoun MRN: [id number] Date of Birth: 07-Jan-1930  Transition of Care [facility]) CM/SW Contact  Marina Goodell Phone Number: [phone] 10/30/2019, 10:32 AM  Clinical Narrative:    Placement to [facility] SNF pending insurance auth. Insurance authorization initiated REF# [id number], rep. Kayla [phone].  This CSW faxed Facesheet, H&P, FL2, PT Notes and progress notes to [phone].  DME request for walker w/ 5" wheel and 3in1,  [employer] Jermaine Jenkins [phone].  Will deliver equipment today 5/14 or tomorrow 5/15.    Expected Discharge Plan: Skilled Nursing Facility Barriers to Discharge: ED SNF auth  Expected Discharge Plan and Services Expected Discharge Plan: Skilled Nursing Facility In-house Referral: Clinical Social Work   Post Acute Care Choice: Skilled Nursing Facility Living arrangements for the past 2 months: Assisted Living Facility                 DME Arranged: 3-N-1, Environmental consultant rolling(rolling walker w/ 5" wheels) DME Agency: Other - Comment([employer] - Shaune Leeks [phone]) Date DME Agency Contacted: 10/30/19 Time DME Agency Contacted: [phone] Representative spoke with at DME Agency: Shaune Leeks             Social Determinants of Health (SDOH) Interventions    Readmission Risk Interventions No flowsheet data found.

## 2019-10-30 NOTE — Progress Notes (Signed)
Physical Therapy Treatment Patient Details Name: Brandon Calhoun MRN: [id number] DOB: 09/05/1929 Today's Date: 10/30/2019    History of Present Illness Pt is an 86 y.o. male presenting to hospital 5/12 s/p fall (tripped after catching leg on metal frame of bed); c/o R shoulder pain and sustained skin tear L LE.  Imaging showing nondisplaced lateral 4th rib fx and R posterolateral 5th and 6th rib fx's.  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    PT Comments    Pt just arrived from ED up to new room on medical floor.  During session pt requiring min to mod assist with bed mobility; min to mod assist with multiple transfers (pt with difficulty scooting forward and requiring assist to shift weight forward to stand; assist required to control descent sitting); CGA to min assist to ambulate 140 feet with RW (pt appearing shaky requiring assist for balance/safety intermittently).  Pt appearing to fatigue with activities; generalized weakness and decreased activity tolerance noted.  Will continue to focus on strengthening and progressive functional mobility during hospitalization.  Continue to recommend STR d/t current assist levels and increased risk for falls.    Follow Up Recommendations  SNF     Equipment Recommendations  Rolling walker with 5" wheels;3in1 (PT)    Recommendations for Other Services       Precautions / Restrictions Precautions Precautions: Fall Precaution Comments: rib fx's Restrictions Weight Bearing Restrictions: No    Mobility  Bed Mobility Overal bed mobility: Needs Assistance Bed Mobility: Supine to Sit;Sit to Supine     Supine to sit: Min assist(assist for trunk) Sit to supine: Mod assist(assist for LE's)   General bed mobility comments: increased time for pt to perform (pt attempting to do as much as he could on his own); vc's for technique  Transfers Overall transfer level: Needs  assistance Equipment used: Rolling walker (2 wheeled) Transfers: Sit to/from Stand Sit to Stand: Min assist;Mod assist         General transfer comment: x2 trials standing from recliner and x1 trial standing from bed; pt with significant difficulty scooting forward towards edge of sitting surface; assist to shift weight forward to stand; vc's for overall technique  Ambulation/Gait Ambulation/Gait assistance: Min guard;Min assist Gait Distance (Feet): 140 Feet Assistive device: Rolling walker (2 wheeled)   Gait velocity: decreased   General Gait Details: decreased B LE step length/foot clearance/heelstrike; appearing shaky/unsteady requiring assist for safety and balance intermittently   Stairs             Wheelchair Mobility    Modified Rankin (Stroke Patients Only)       Balance Overall balance assessment: Needs assistance Sitting-balance support: No upper extremity supported;Feet supported Sitting balance-Leahy Scale: Good Sitting balance - Comments: steady sitting reaching within BOS   Standing balance support: No upper extremity supported Standing balance-Leahy Scale: Poor Standing balance comment: pt requiring at least single UE support for static standing balance                            Cognition Arousal/Alertness: Awake/alert Behavior During Therapy: WFL for tasks assessed/performed Overall Cognitive Status: Within Functional Limits for tasks assessed                                 General Comments: mild increased time to respond and initiate movement at times  Exercises      General Comments General comments (skin integrity, edema, etc.): bandage noted L LE.  Nursing cleared pt for participation in physical therapy.  Pt agreeable to PT session.  Pt's daughter present during session.      Pertinent Vitals/Pain Pain Assessment: Faces Faces Pain Scale: No hurt Pain Intervention(s): Limited activity within patient's  tolerance;Monitored during session;Repositioned  Vitals (HR and O2 on room air) stable and WFL throughout treatment session.    Home Living                      Prior Function            PT Goals (current goals can now be found in the care plan section) Acute Rehab PT Goals Patient Stated Goal: to improve mobility PT Goal Formulation: With patient/family Time For Goal Achievement: 11/12/19 Potential to Achieve Goals: Good Progress towards PT goals: Progressing toward goals    Frequency    Min 2X/week      PT Plan Current plan remains appropriate    Co-evaluation              AM-PAC PT "6 Clicks" Mobility   Outcome Measure  Help needed turning from your back to your side while in a flat bed without using bedrails?: A Little Help needed moving from lying on your back to sitting on the side of a flat bed without using bedrails?: A Lot Help needed moving to and from a bed to a chair (including a wheelchair)?: A Little Help needed standing up from a chair using your arms (e.g., wheelchair or bedside chair)?: A Lot Help needed to walk in hospital room?: A Little Help needed climbing 3-5 steps with a railing? : A Lot 6 Click Score: 15    End of Session Equipment Utilized During Treatment: Gait belt Activity Tolerance: Patient tolerated treatment well Patient left: in chair;with call bell/phone within reach;with chair alarm set;with nursing/sitter in room;with family/visitor present;Other (comment)(B heels floating via pillow) Nurse Communication: Mobility status;Precautions PT Visit Diagnosis: Unsteadiness on feet (R26.81);Other abnormalities of gait and mobility (R26.89);Muscle weakness (generalized) (M62.81);History of falling (Z91.81);Difficulty in walking, not elsewhere classified (R26.2);Pain Pain - Right/Left: Right Pain - part of body: Shoulder     Time: 1540-1609 PT Time Calculation (min) (ACUTE ONLY): 29 min  Charges:  $Gait Training: 8-22  mins $Therapeutic Activity: 8-22 mins                     Hendricks Limes, PT 10/30/19, 4:41 PM

## 2019-10-30 NOTE — Progress Notes (Signed)
Progress Note    Brandon Calhoun  HEN:[id number] DOB: 02/26/1930  DOA: 10/28/2019 PCP: [name], MD      Brief Narrative:    Medical records reviewed and are as summarized below:  Brandon Calhoun is an 86 y.o. male  with medical history significant for Parkinson's disease on Sinemet, chronic lower extremity venous stasis with balance issues, anxiety, asthma who presented to the emergency room for evaluation of a fall.       Assessment/Plan:   Principal Problem:   Multiple fractures of ribs, right side, initial encounter for closed fracture Active Problems:   Mood disorder (HCC)   Asthma   Parkinson's disease (HCC)   Hyponatremia   Leukocytosis   Accidental fall   Generalized weakness  Multiple fractures of ribs (right 4th to 6th ribs), right side, Accidental fall Analgesics as needed for pain.  PT recommended rehabilitation at SNF.  Consult OT.  Generalized weakness Exact etiology is not clear.    Hyponatremia  Sodium level initially improved from 128-131 but is gone down again to 128.  I had recommended IV fluids to treat hyponatremia.  However, his daughter said that patient has had issues with low sodium.  His daughter said that his sodium has mostly been in the low 130s.  Later, she said that she was able to find lab results from May 2020 that showed a sodium level of 135.  Continue to monitor sodium level.  IV fluids will be withheld for now.    Leukocytosis Improved.  No obvious source of infection and no stigmata of infection  Anxiety -Continue Celexa for now.  Consider stopping Celexa if hyponatremia persists or worsens.    Asthma -DuoNebs as needed    Parkinson's disease (HCC) -Continue Sinemet      Hypertension His daughter said Aldactone was prescribed solely for hypertension.  She also mentioned that patient has chronic leg swelling and she was told that Aldactone will help with that.  She said patient has been on Aldactone  for about 5 years.  She also mentioned that patient does not have a diagnosis of CHF.  Trial of salt tablets for now.   Body mass index is 22.53 kg/m.   Daughter's concerns/complaints were conveyed to Erskine Squibb, Charity fundraiser. His daughter said she prefer that patient be discharged to SNF today if the insurance company authorizes discharge to SNF.  Family Communication/Anticipated D/C date and plan/Code Status   DVT prophylaxis:  Code Status:  Family Communication: Plan discussed with his daughter, Darl Pikes, at the bedside Disposition Plan:    Status is: Observation  The patient remains OBS appropriate and will d/c before 2 midnights.  Dispo: The patient is from: ALF              Anticipated d/c is to: SNF              Anticipated d/c date is: 1 day              Patient currently is not medically stable to d/c.           Subjective:   He complains of pain on the right side of his chest.  No shortness of breath, dizziness or headache.  He feels better today.  His daughter had multiple complaints this morning.  She complained about hygiene (that her dad did not have a toothbrush).  She also complained of late breakfast and patient's positioning in bed.  She said that patient had not  moved around much since his admission except when he worked with PT yesterday.  Objective:    Vitals:   10/30/19 0215 10/30/19 0230 10/30/19 0748 10/30/19 1638  BP:  139/61 140/63 132/61  Pulse: (!) 56 (!) 57 62 70  Resp:  16 15   Temp:   98.6 F (37 C) 97.7 F (36.5 C)  TempSrc:   Oral Oral  SpO2: 94% 93% 93% 97%  Weight:      Height:       No data found.   Intake/Output Summary (Last 24 hours) at 10/30/2019 1748 Last data filed at 10/30/2019 1652 Gross per 24 hour  Intake 1000 ml  Output 1075 ml  Net -75 ml   Filed Weights   10/28/19 2241  Weight: 71.2 kg    Exam:  GEN: NAD SKIN: wound on left leg, abrasion on left knee EYES: EOMI ENT: MMM CV: RRR PULM:  Mild wheezing, no  rales ABD: soft, ND, NT, +BS CNS: AAO x 3, non focal EXT: right leg edema, no tenderness   Data Reviewed:   I have personally reviewed following labs and imaging studies:  Labs: Labs show the following:   Basic Metabolic Panel: Recent Labs  Lab 10/29/19 0145 10/29/19 0145 10/29/19 1353 10/30/19 1149  NA 128*  --  131* 128*  K 3.7   < > 4.0 3.7  CL 97*  --  99 98  CO2 22  --  26 23  GLUCOSE 109*  --  113* 123*  BUN 22  --  16 14  CREATININE 0.55*  --  0.76 0.52*  CALCIUM 8.2*  --  8.0* 8.2*   < > = values in this interval not displayed.   GFR Estimated Creatinine Clearance: 64.3 mL/min (A) (by C-G formula based on SCr of 0.52 mg/dL (L)). Liver Function Tests: No results for input(s): AST, ALT, ALKPHOS, BILITOT, PROT, ALBUMIN in the last 168 hours. No results for input(s): LIPASE, AMYLASE in the last 168 hours. No results for input(s): AMMONIA in the last 168 hours. Coagulation profile No results for input(s): INR, PROTIME in the last 168 hours.  CBC: Recent Labs  Lab 10/29/19 0145 10/30/19 1149  WBC 11.6* 10.0  NEUTROABS 9.2*  --   HGB 12.2* 13.5  HCT 34.9* 37.4*  MCV 87.9 86.2  PLT 281 298   Cardiac Enzymes: No results for input(s): CKTOTAL, CKMB, CKMBINDEX, TROPONINI in the last 168 hours. BNP (last 3 results) No results for input(s): PROBNP in the last 8760 hours. CBG: No results for input(s): GLUCAP in the last 168 hours. D-Dimer: No results for input(s): DDIMER in the last 72 hours. Hgb A1c: No results for input(s): HGBA1C in the last 72 hours. Lipid Profile: No results for input(s): CHOL, HDL, LDLCALC, TRIG, CHOLHDL, LDLDIRECT in the last 72 hours. Thyroid function studies: No results for input(s): TSH, T4TOTAL, T3FREE, THYROIDAB in the last 72 hours.  Invalid input(s): FREET3 Anemia work up: No results for input(s): VITAMINB12, FOLATE, FERRITIN, TIBC, IRON, RETICCTPCT in the last 72 hours. Sepsis Labs: Recent Labs  Lab 10/29/19 0145  10/30/19 1149  WBC 11.6* 10.0    Microbiology Recent Results (from the past 240 hour(s))  SARS Coronavirus 2 by RT PCR (hospital order, performed in [facility] hospital lab) Nasopharyngeal Nasopharyngeal Swab     Status: None   Collection Time: 10/29/19  1:33 AM   Specimen: Nasopharyngeal Swab  Result Value Ref Range Status   SARS Coronavirus 2 NEGATIVE NEGATIVE Final  Comment: (NOTE) SARS-CoV-2 target nucleic acids are NOT DETECTED. The SARS-CoV-2 RNA is generally detectable in upper and lower respiratory specimens during the acute phase of infection. The lowest concentration of SARS-CoV-2 viral copies this assay can detect is 250 copies / mL. A negative result does not preclude SARS-CoV-2 infection and should not be used as the sole basis for treatment or other patient management decisions.  A negative result may occur with improper specimen collection / handling, submission of specimen other than nasopharyngeal swab, presence of viral mutation(s) within the areas targeted by this assay, and inadequate number of viral copies (<250 copies / mL). A negative result must be combined with clinical observations, patient history, and epidemiological information. Fact Sheet for Patients:   BoilerBrush.com.cy Fact Sheet for Healthcare Providers: https://pope.com/ This test is not yet approved or cleared  by the [place] FDA and has been authorized for detection and/or diagnosis of SARS-CoV-2 by FDA under an Emergency Use Authorization (EUA).  This EUA will remain in effect (meaning this test can be used) for the duration of the COVID-19 declaration under Section 564(b)(1) of the Act, 21 U.S.C. section 360bbb-3(b)(1), unless the authorization is terminated or revoked sooner. Performed at [facility], [address] [place], [place] [address]     Procedures and diagnostic studies:  DG Chest 2 View  Result Date:  10/29/2019 CLINICAL DATA:  Tripped and fell, right rib fracture, right shoulder pain EXAM: CHEST - 2 VIEW COMPARISON:  07/05/2016 FINDINGS: Frontal and lateral views of the chest demonstrate an unremarkable cardiac silhouette. No airspace disease, effusion, or pneumothorax. There are right posterolateral fifth and sixth rib fractures. IMPRESSION: 1. Right posterolateral fifth and sixth rib fractures. 2. Otherwise no acute intrathoracic process. Electronically Signed   By: Sharlet Salina M.D.   On: 10/29/2019 00:48   DG Shoulder Right  Result Date: 10/29/2019 CLINICAL DATA:  Fall and pain EXAM: RIGHT SHOULDER - 2+ VIEW COMPARISON:  None. FINDINGS: There is diffuse osteopenia. A high-riding humeral head is noted advanced glenohumeral joint osteoarthritis seen with joint space loss. There is a probable nondisplaced fracture seen of the lateral fourth right rib. AC joint arthrosis is noted. No focal soft tissue swelling is seen. IMPRESSION: Nondisplaced lateral fourth right rib fracture. Advanced glenohumeral joint osteoarthritis and a high-riding humeral head. Electronically Signed   By: Jonna Clark M.D.   On: 10/29/2019 00:14   DG Tibia/Fibula Left  Result Date: 10/29/2019 CLINICAL DATA:  Tripped and fell, lacerations EXAM: LEFT TIBIA AND FIBULA - 2 VIEW COMPARISON:  08/20/2016 FINDINGS: Frontal and lateral views of the left tibia and fibula are obtained. No acute displaced fracture. Alignment of the left knee and ankle is anatomic. Mild diffuse subcutaneous edema. No radiopaque foreign bodies. IMPRESSION: 1. Mild subcutaneous edema.  No acute bony abnormality. Electronically Signed   By: Sharlet Salina M.D.   On: 10/29/2019 00:11    Medications:   . bacitracin   Topical BID  . carbidopa-levodopa  1 tablet Oral TID  . citalopram  10 mg Oral Daily  . enoxaparin (LOVENOX) injection  40 mg Subcutaneous Q24H  . finasteride  5 mg Oral Daily  . fluticasone  2 spray Each Nare Daily  . lidocaine  1 patch  Transdermal Q24H  . loratadine  10 mg Oral Daily  . mometasone-formoterol  2 puff Inhalation BID  . montelukast  10 mg Oral QHS  . pantoprazole  40 mg Oral Daily  . tamsulosin  0.4 mg Oral Daily  Continuous Infusions:   LOS: 0 days   Aide Wojnar  [facility]     10/30/2019, 5:48 PM

## 2019-10-30 NOTE — [place] FL2 (Signed)
[place] MEDICAID FL2 LEVEL OF CARE SCREENING TOOL     IDENTIFICATION  Patient Name: Brandon Calhoun Birthdate: May 17, 1930 Sex: male Admission Date (Current Location): 10/28/2019  [place] and [place] Number:  Chiropodist and Address:  [facility], [address] [place], [place] [address]      Provider Number: [id number]  Attending Physician Name and Address:  Lurene Shadow, MD  Relative Name and Phone Number:  Angelique Holm [phone] - daughter    Current Level of Care: Hospital Recommended Level of Care: Skilled Nursing Facility Prior Approval Number:    Date Approved/Denied:   PASRR Number: [id number]  Discharge Plan: SNF    Current Diagnoses: Patient Active Problem List   Diagnosis Date Noted  . Hyponatremia 10/29/2019  . Leukocytosis 10/29/2019  . Accidental fall 10/29/2019  . Multiple fractures of ribs, right side, initial encounter for closed fracture 10/29/2019  . Generalized weakness 10/29/2019  . Venous insufficiency of both lower extremities 07/03/2018  . Parkinson's disease (HCC) 01/03/2017  . 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 GERD (gastroesophageal reflux disease)   . Asthma   . Mood disorder (HCC)     Orientation RESPIRATION BLADDER Height & Weight     Self, Situation, Place  Normal Continent Weight: 157 lb (71.2 kg) Height:  5\' 10"  (177.8 cm)  BEHAVIORAL SYMPTOMS/MOOD NEUROLOGICAL BOWEL NUTRITION STATUS      Continent Diet  AMBULATORY STATUS COMMUNICATION OF NEEDS Skin   Limited Assist Verbally Normal                       Personal Care Assistance Level of Assistance  Dressing, Bathing Bathing Assistance: Limited assistance   Dressing Assistance: Limited assistance     Functional Limitations Info             SPECIAL CARE FACTORS FREQUENCY                       Contractures Contractures Info: Not present    Additional Factors Info                   Current Medications (10/30/2019):  This is the current hospital active medication list Current Facility-Administered Medications  Medication Dose Route Frequency Provider Last Rate Last Admin  . acetaminophen (TYLENOL) tablet 650 mg  650 mg Oral Q6H PRN 11/01/2019, MD       Or  . acetaminophen (TYLENOL) suppository 650 mg  650 mg Rectal Q6H PRN Andris Baumann, MD      . acetaminophen (TYLENOL) tablet 650 mg  650 mg Oral Q6H PRN Andris Baumann, MD   650 mg at 10/29/19 1305  . albuterol (VENTOLIN HFA) 108 (90 Base) MCG/ACT inhaler 2 puff  2 puff Inhalation TID PRN 10/31/19, MD      . bacitracin ointment   Topical BID Enedina Finner, MD   1 application at 10/30/19 0911  . bismuth subsalicylate (PEPTO BISMOL) 262 MG/15ML suspension 10 mL  10 mL Oral Q6H PRN 11/01/19, MD      . carbidopa-levodopa (SINEMET IR) 25-100 MG per tablet immediate release 1 tablet  1 tablet Oral TID Enedina Finner, MD   1 tablet at 10/30/19 0902  . citalopram (CELEXA) tablet 10 mg  10 mg Oral Daily 11/01/19, MD   10 mg at 10/30/19 0902  . dextromethorphan-guaiFENesin (ROBITUSSIN-DM) 10-100 MG/5ML liquid 10 mL  10 mL Oral PRN 11/01/19,  MD      . enoxaparin (LOVENOX) injection 40 mg  40 mg Subcutaneous Q24H Judd Gaudier V, MD   40 mg at 10/30/19 0901  . finasteride (PROSCAR) tablet 5 mg  5 mg Oral Daily Fritzi Mandes, MD      . fluticasone [facility]) 50 MCG/ACT nasal spray 2 spray  2 spray Each Nare Daily Fritzi Mandes, MD   2 spray at 10/30/19 0910  . lidocaine (LIDODERM) 5 % 1 patch  1 patch Transdermal Q24H Alfred Levins, [place], MD      . loratadine (CLARITIN) tablet 10 mg  10 mg Oral Daily Fritzi Mandes, MD   10 mg at 10/30/19 0904  . mometasone-formoterol (DULERA) 200-5 MCG/ACT inhaler 2 puff  2 puff Inhalation BID Fritzi Mandes, MD   2 puff at 10/30/19 0908  . montelukast (SINGULAIR) tablet 10 mg  10 mg Oral QHS Fritzi Mandes, MD   10 mg at 10/29/19 2330  . ondansetron (ZOFRAN) tablet 4 mg  4 mg Oral Q6H  PRN Athena Masse, MD       Or  . ondansetron [facility]) injection 4 mg  4 mg Intravenous Q6H PRN Athena Masse, MD      . ondansetron [facility]) tablet 4 mg  4 mg Oral PRN Fritzi Mandes, MD      . pantoprazole (PROTONIX) EC tablet 40 mg  40 mg Oral Daily Fritzi Mandes, MD   40 mg at 10/30/19 0904  . spironolactone (ALDACTONE) tablet 25 mg  25 mg Oral Daily Fritzi Mandes, MD   25 mg at 10/29/19 1307  . tamsulosin (FLOMAX) capsule 0.4 mg  0.4 mg Oral Daily Fritzi Mandes, MD   0.4 mg at 10/30/19 3500   Current Outpatient Medications  Medication Sig Dispense Refill  . acetaminophen (TYLENOL) 325 MG tablet Take 650 mg by mouth every 6 (six) hours as needed.    Marland Kitchen alum & mag hydroxide-simeth (MAALOX/MYLANTA) 200-200-20 MG/5ML suspension Take 30 mLs by mouth every 6 (six) hours as needed for indigestion or heartburn.    . bismuth subsalicylate (PEPTO BISMOL) 262 MG/15ML suspension Take 10 mLs by mouth every 6 (six) hours as needed.    . budesonide-formoterol (SYMBICORT) 160-4.5 MCG/ACT inhaler Inhale 2 puffs into the lungs 2 (two) times daily.    . carbamide peroxide (DEBROX) 6.5 % OTIC solution Place 5 drops into both ears as needed.    . carbidopa-levodopa (SINEMET IR) 25-100 MG tablet Take 1 tablet by mouth 3 (three) times daily. 90 tablet 11  . cetirizine (ZYRTEC) 10 MG tablet Take 10 mg by mouth daily as needed for allergies.    . citalopram (CELEXA) 10 MG tablet Take 1 tablet (10 mg total) by mouth daily. 30 tablet 2  . Dextromethorphan-guaiFENesin 10-100 MG/5ML SOLN Take 10 mLs by mouth as needed.    . diphenhydrAMINE (BENADRYL) 25 mg capsule Take 25 mg by mouth every 6 (six) hours as needed. As needed for allergic reactions    . finasteride (PROSCAR) 5 MG tablet Take 5 mg by mouth daily.    . fluticasone (FLONASE) 50 MCG/ACT nasal spray Place 2 sprays into the nose daily.    . Glucose 15 GM/32ML GEL as needed. Give 1 packet by mouth as needed for low blood sugar    . loperamide (IMODIUM) 2 MG capsule  Take 4 mg by mouth as needed for diarrhea or loose stools.    . magnesium hydroxide (MILK OF MAGNESIA) 400 MG/5ML suspension Take 30 mLs by mouth daily as  needed for mild constipation.    . montelukast (SINGULAIR) 10 MG tablet Take 10 mg by mouth at bedtime.    Marland Kitchen nystatin (MYCOSTATIN/NYSTOP) powder Apply 1 application topically as needed.    Marland Kitchen omeprazole (PRILOSEC) 20 MG capsule Take 1 capsule by mouth daily.    . ondansetron (ZOFRAN) 4 MG tablet Take 4 mg by mouth as needed for nausea or vomiting.    Marland Kitchen spironolactone (ALDACTONE) 25 MG tablet Take 25 mg by mouth daily.     . tamsulosin (FLOMAX) 0.4 MG CAPS capsule Take 0.4 mg by mouth daily.     Marland Kitchen albuterol (PROVENTIL HFA;VENTOLIN HFA) 108 (90 Base) MCG/ACT inhaler Inhale 2 puffs into the lungs 3 (three) times daily as needed for wheezing or shortness of breath.       Discharge Medications: Please see discharge summary for a list of discharge medications.  Relevant Imaging Results:  Relevant Lab Results:   Additional Information SS# [id number] - PT 5X per week.  Maicy Filip, LCSWA

## 2019-10-31 DIAGNOSIS — E871 Hypo-osmolality and hyponatremia: Secondary | ICD-10-CM | POA: Diagnosis present

## 2019-10-31 DIAGNOSIS — D72829 Elevated white blood cell count, unspecified: Secondary | ICD-10-CM | POA: Diagnosis present

## 2019-10-31 DIAGNOSIS — S81812A Laceration without foreign body, left lower leg, initial encounter: Secondary | ICD-10-CM | POA: Diagnosis present

## 2019-10-31 DIAGNOSIS — Z79899 Other long term (current) drug therapy: Secondary | ICD-10-CM | POA: Diagnosis not present

## 2019-10-31 DIAGNOSIS — S2241XA Multiple fractures of ribs, right side, initial encounter for closed fracture: Secondary | ICD-10-CM | POA: Diagnosis present

## 2019-10-31 DIAGNOSIS — J45909 Unspecified asthma, uncomplicated: Secondary | ICD-10-CM | POA: Diagnosis present

## 2019-10-31 DIAGNOSIS — E860 Dehydration: Secondary | ICD-10-CM | POA: Diagnosis present

## 2019-10-31 DIAGNOSIS — W19XXXD Unspecified fall, subsequent encounter: Secondary | ICD-10-CM | POA: Diagnosis not present

## 2019-10-31 DIAGNOSIS — F39 Unspecified mood [affective] disorder: Secondary | ICD-10-CM | POA: Diagnosis present

## 2019-10-31 DIAGNOSIS — Z66 Do not resuscitate: Secondary | ICD-10-CM | POA: Diagnosis present

## 2019-10-31 DIAGNOSIS — N400 Enlarged prostate without lower urinary tract symptoms: Secondary | ICD-10-CM | POA: Diagnosis present

## 2019-10-31 DIAGNOSIS — R531 Weakness: Secondary | ICD-10-CM | POA: Diagnosis not present

## 2019-10-31 DIAGNOSIS — G20 Parkinson's disease: Secondary | ICD-10-CM | POA: Diagnosis present

## 2019-10-31 DIAGNOSIS — I959 Hypotension, unspecified: Secondary | ICD-10-CM | POA: Diagnosis present

## 2019-10-31 DIAGNOSIS — Y92092 Bedroom in other non-institutional residence as the place of occurrence of the external cause: Secondary | ICD-10-CM | POA: Diagnosis not present

## 2019-10-31 DIAGNOSIS — Z7951 Long term (current) use of inhaled steroids: Secondary | ICD-10-CM | POA: Diagnosis not present

## 2019-10-31 DIAGNOSIS — Z20822 Contact with and (suspected) exposure to covid-19: Secondary | ICD-10-CM | POA: Diagnosis present

## 2019-10-31 DIAGNOSIS — W010XXA Fall on same level from slipping, tripping and stumbling without subsequent striking against object, initial encounter: Secondary | ICD-10-CM | POA: Diagnosis present

## 2019-10-31 DIAGNOSIS — I10 Essential (primary) hypertension: Secondary | ICD-10-CM | POA: Diagnosis present

## 2019-10-31 DIAGNOSIS — E861 Hypovolemia: Secondary | ICD-10-CM | POA: Diagnosis present

## 2019-10-31 DIAGNOSIS — K219 Gastro-esophageal reflux disease without esophagitis: Secondary | ICD-10-CM | POA: Diagnosis present

## 2019-10-31 DIAGNOSIS — F419 Anxiety disorder, unspecified: Secondary | ICD-10-CM | POA: Diagnosis present

## 2019-10-31 LAB — BASIC METABOLIC PANEL
Anion gap: 8 (ref 5–15)
BUN: 22 mg/dL (ref 8–23)
CO2: 21 mmol/L — ABNORMAL LOW (ref 22–32)
Calcium: 8.1 mg/dL — ABNORMAL LOW (ref 8.9–10.3)
Chloride: 98 mmol/L (ref 98–111)
Creatinine, Ser: 0.6 mg/dL — ABNORMAL LOW (ref 0.61–1.24)
GFR calc Af Amer: >60 mL/min (ref >60)
GFR calc non Af Amer: >60 mL/min (ref >60)
Glucose, Bld: 151 mg/dL — ABNORMAL HIGH (ref 70–99)
Potassium: 3.8 mmol/L (ref 3.5–5.1)
Sodium: 127 mmol/L — ABNORMAL LOW (ref 135–145)

## 2019-10-31 LAB — LIPID PANEL
Cholesterol: 165 mg/dL (ref 0–200)
HDL: 46 mg/dL (ref >40)
LDL Cholesterol: 111 mg/dL — ABNORMAL HIGH (ref 0–99)
Total CHOL/HDL Ratio: 3.6 RATIO
Triglycerides: 40 mg/dL (ref <150)
VLDL: 8 mg/dL (ref 0–40)

## 2019-10-31 LAB — OSMOLALITY, URINE: Osmolality, Ur: 679 mOsm/kg (ref 300–900)

## 2019-10-31 LAB — CORTISOL: Cortisol, Plasma: 53 ug/dL

## 2019-10-31 LAB — SODIUM, URINE, RANDOM: Sodium, Ur: 15 mmol/L

## 2019-10-31 LAB — TSH: TSH: 1.095 u[IU]/mL (ref 0.350–4.500)

## 2019-10-31 MED ORDER — SODIUM CHLORIDE 0.9 % IV SOLN: INTRAVENOUS | Status: DC

## 2019-10-31 NOTE — Evaluation (Signed)
Occupational Therapy Evaluation Patient Details Name: Brandon Calhoun MRN: [id number] DOB: [birth year] Today's Date: 10/31/2019    History of Present Illness Pt is an 86 y.o. male presenting to hospital 5/12 s/p fall (tripped after catching leg on metal frame of bed); c/o R shoulder pain and sustained skin tear L LE.  Imaging showing nondisplaced lateral 4th rib fx and R posterolateral 5th and 6th rib fx's.  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   Clinical Impression   Brandon Calhoun was seen for OT evaluation this date. Prior to hospital admission, pt was MOD I for mobility c RW, living at [facility]. Pt presents to acute OT demonstrating impaired ADL performance and functional mobility 2/2 functional strength/ROM/balance deficits, decreased activity tolerance, and decreased functional use of dominant RUE. Pt currently requires SETUP + increased time for self-feeding seated in chair. MOD A + RW simulated BSC t/f c TOTAL A for perihygiene in standing. MAX A for LBD at bed level. Pt would benefit from skilled OT to address noted impairments and functional limitations (see below for any additional details) in order to maximize safety and independence while minimizing falls risk and caregiver burden. Upon hospital discharge, recommend STR to maximize pt safety and return to PLOF.     Follow Up Recommendations  SNF    Equipment Recommendations       Recommendations for Other Services       Precautions / Restrictions Precautions Precautions: Fall Precaution Comments: rib fx's Restrictions Weight Bearing Restrictions: No      Mobility Bed Mobility Overal bed mobility: Needs Assistance Bed Mobility: Supine to Sit     Supine to sit: HOB elevated;Max assist     General bed mobility comments: MAX A for BLE and trunk management  Transfers Overall transfer level: Needs assistance Equipment used: Rolling walker (2  wheeled) Transfers: Sit to/from [employer] Sit to Stand: Mod assist;From elevated surface Stand pivot transfers: Mod assist       General transfer comment: MOD A SPT bed>chair    Balance Overall balance assessment: Needs assistance Sitting-balance support: No upper extremity supported;Feet supported Sitting balance-Leahy Scale: Good     Standing balance support: Bilateral upper extremity supported Standing balance-Leahy Scale: Poor Standing balance comment: BUE support on RW + CGA static standing during perihygiene                           ADL either performed or assessed with clinical judgement   ADL Overall ADL's : Needs assistance/impaired                                       General ADL Comments: SETUP + increased time self feeding seated in chair. MOD A + RW simulated BSC t/f c TOTAL A for perihygiene in standing. MAX A LBD at bed level.      Vision         Perception     Praxis      Pertinent Vitals/Pain Pain Assessment: 0-10 Pain Score: 5  Pain Location: R shoulder/chest Pain Descriptors / Indicators: Aching;Discomfort;Grimacing Pain Intervention(s): Limited activity within patient's tolerance;Repositioned     Hand Dominance Right   Extremity/Trunk Assessment Upper Extremity Assessment Upper Extremity Assessment: RUE deficits/detail;LUE deficits/detail RUE Deficits / Details: AROM: shoulder flexion ~75* limited 2/2 shoulder pain, elbow felxion WFL RUE: Unable  to fully assess due to pain LUE Deficits / Details: AROM WFL grossly    Lower Extremity Assessment Lower Extremity Assessment: Generalized weakness       Communication Communication Communication: No difficulties   Cognition Arousal/Alertness: Awake/alert Behavior During Therapy: WFL for tasks assessed/performed Overall Cognitive Status: Within Functional Limits for tasks assessed                                 General Comments:  Increased time for mobility   General Comments       Exercises Exercises: Other exercises Other Exercises Other Exercises: Pt educated re: OT role, d/c recs, falls prevention, RW technique, energy conservation, pain management  Other Exercises: Self-feeding, simulated toilet t/f, perihygiene in standing, sitting/standing balance/tolerance, sit<>stand, bed mobility, sup>sit, SPT   Shoulder Instructions      Home Living Family/patient expects to be discharged to:: Assisted living                             Home Equipment: Walker - 2 wheels;Walker - 4 wheels   Additional Comments: [facility] (2nd floor apt; takes elevator to get to dining room)      Prior Functioning/Environment Level of Independence: Needs assistance  Gait / Transfers Assistance Needed: Ambulates with RW most of the time but uses rollator outside.  1 other fall (stumbled over feet) in last 6 months. ADL's / Homemaking Assistance Needed: Assist for dressing, laundry, medications, and showers.            OT Problem List: Decreased strength;Decreased range of motion;Decreased activity tolerance;Impaired balance (sitting and/or standing);Decreased knowledge of use of DME or AE      OT Treatment/Interventions: Self-care/ADL training;Therapeutic exercise;Energy conservation;DME and/or AE instruction;Therapeutic activities;Patient/family education;Balance training    OT Goals(Current goals can be found in the care plan section) Acute Rehab OT Goals Patient Stated Goal: to improve mobility OT Goal Formulation: With patient Time For Goal Achievement: 11/14/19 Potential to Achieve Goals: Good ADL Goals Pt Will Perform Grooming: with set-up;with supervision;sitting Pt Will Perform Lower Body Dressing: with min assist;sitting/lateral leans(c LRAD & AE PRN) Pt Will Transfer to Toilet: with min guard assist;stand pivot transfer;bedside commode(c LRAD PRN)  OT Frequency: Min 2X/week   Barriers to D/C:             Co-evaluation              AM-PAC OT "6 Clicks" Daily Activity     Outcome Measure Help from another person eating meals?: None Help from another person taking care of personal grooming?: A Little Help from another person toileting, which includes using toliet, bedpan, or urinal?: A Lot Help from another person bathing (including washing, rinsing, drying)?: A Lot Help from another person to put on and taking off regular upper body clothing?: A Little Help from another person to put on and taking off regular lower body clothing?: A Lot 6 Click Score: 16   End of Session Equipment Utilized During Treatment: Surveyor, mining Communication: Mobility status  Activity Tolerance: Patient tolerated treatment well Patient left: in chair;with call bell/phone within reach;with chair alarm set  OT Visit Diagnosis: Unsteadiness on feet (R26.81);Other abnormalities of gait and mobility (R26.89)                Time: 1950-9326 OT Time Calculation (min): 15 min Charges:  OT General Charges $OT Visit: 1  Visit OT Evaluation $OT Eval Low Complexity: 1 Low OT Treatments $Self Care/Home Management : 8-22 mins  Kathie Dike, M.S. OTR/L  10/31/19, 9:14 AM

## 2019-10-31 NOTE — Progress Notes (Addendum)
Progress Note    Brandon Calhoun  FTD:[id number] DOB: [birth year]  DOA: 10/28/2019 PCP: [name], MD      Brief Narrative:    Medical records reviewed and are as summarized below:  Brandon Calhoun is an 86 y.o. male  with medical history significant for Parkinson's disease on Sinemet, chronic lower extremity venous stasis with balance issues, anxiety, asthma who presented to the emergency room for evaluation of a fall.       Assessment/Plan:   Principal Problem:   Multiple fractures of ribs, right side, initial encounter for closed fracture Active Problems:   Mood disorder (HCC)   Asthma   Parkinson's disease (HCC)   Hyponatremia   Leukocytosis   Accidental fall   Generalized weakness  Multiple fractures of ribs (right 4th to 6th ribs), right side, Accidental fall Analgesics as needed for pain.  PT recommended rehabilitation at SNF.  Consult OT.  Generalized weakness Continue PT and OT for increased strength, gait training and endurance.    Hyponatremia  Sodium has slightly worsened today despite starting salt tablets yesterday.  Aldactone has been held.  Trial of IV fluids (normal saline infusion).  Continue salt tablet.  Check TSH, cortisol, lipid panel, urine sodium and osmolality.    Leukocytosis Improved.  No obvious source of infection and no stigmata of infection  Anxiety -Continue Celexa for now.  Consider stopping Celexa if hyponatremia persists or worsens.    Asthma -DuoNebs as needed    Parkinson's disease (HCC) -Continue Sinemet      Hypotension with history of hypertension IV fluids has been ordered because BP is low.  Aldactone has been held.   Body mass index is 22.53 kg/m.     Family Communication/Anticipated D/C date and plan/Code Status   DVT prophylaxis:  Code Status:  Family Communication: Plan discussed with his daughter, Darl Pikes, at the bedside Disposition Plan:    Status is: Observation  The patient  remains OBS appropriate and will d/c before 2 midnights.  Dispo: The patient is from: ALF              Anticipated d/c is to: SNF              Anticipated d/c date is: 1 day              Patient currently is not medically stable to d/c.           Subjective:   No complaints.  He feels a little better but not back to baseline.  No dizziness, nausea, vomiting, diarrhea, chest pain, shortness of breath, cough or wheezing.  He worked with OT today and he was thought to have impaired mobility and functional limitations.  Objective:    Vitals:   10/31/19 0442 10/31/19 1247 10/31/19 1250 10/31/19 1338  BP: 127/68 (!) 86/50 (!) 88/49 (!) 107/57  Pulse: 82 93 92 88  Resp: 20 15    Temp: 97.6 F (36.4 C) 98.5 F (36.9 C)    TempSrc: Oral Oral    SpO2: 93% 91% 91%   Weight:      Height:       No data found.   Intake/Output Summary (Last 24 hours) at 10/31/2019 1550 Last data filed at 10/30/2019 2212 Gross per 24 hour  Intake 120 ml  Output 350 ml  Net -230 ml   Filed Weights   10/28/19 2241  Weight: 71.2 kg    Exam:  GEN: NAD, sitting up  in the chair and eating breakfast SKIN: wound on left leg, abrasion on left knee EYES: No pallor or icterus ENT: MMM CV: RRR PULM: No rales or wheezing ABD: soft, ND, NT, +BS CNS: AAO x 3, non focal EXT: trace right leg edema, no tenderness   Data Reviewed:   I have personally reviewed following labs and imaging studies:  Labs: Labs show the following:   Basic Metabolic Panel: Recent Labs  Lab 10/29/19 0145 10/29/19 0145 10/29/19 1353 10/29/19 1353 10/30/19 1149 10/31/19 0900  NA 128*  --  131*  --  128* 127*  K 3.7   < > 4.0   < > 3.7 3.8  CL 97*  --  99  --  98 98  CO2 22  --  26  --  23 21*  GLUCOSE 109*  --  113*  --  123* 151*  BUN 22  --  16  --  14 22  CREATININE 0.55*  --  0.76  --  0.52* 0.60*  CALCIUM 8.2*  --  8.0*  --  8.2* 8.1*   < > = values in this interval not displayed.   GFR Estimated  Creatinine Clearance: 64.3 mL/min (A) (by C-G formula based on SCr of 0.6 mg/dL (L)). Liver Function Tests: No results for input(s): AST, ALT, ALKPHOS, BILITOT, PROT, ALBUMIN in the last 168 hours. No results for input(s): LIPASE, AMYLASE in the last 168 hours. No results for input(s): AMMONIA in the last 168 hours. Coagulation profile No results for input(s): INR, PROTIME in the last 168 hours.  CBC: Recent Labs  Lab 10/29/19 0145 10/30/19 1149  WBC 11.6* 10.0  NEUTROABS 9.2*  --   HGB 12.2* 13.5  HCT 34.9* 37.4*  MCV 87.9 86.2  PLT 281 298   Cardiac Enzymes: No results for input(s): CKTOTAL, CKMB, CKMBINDEX, TROPONINI in the last 168 hours. BNP (last 3 results) No results for input(s): PROBNP in the last 8760 hours. CBG: No results for input(s): GLUCAP in the last 168 hours. D-Dimer: No results for input(s): DDIMER in the last 72 hours. Hgb A1c: No results for input(s): HGBA1C in the last 72 hours. Lipid Profile: Recent Labs    10/31/19 0900  CHOL 165  HDL 46  LDLCALC 111*  TRIG 40  CHOLHDL 3.6   Thyroid function studies: Recent Labs    10/31/19 0900  TSH 1.095   Anemia work up: No results for input(s): VITAMINB12, FOLATE, FERRITIN, TIBC, IRON, RETICCTPCT in the last 72 hours. Sepsis Labs: Recent Labs  Lab 10/29/19 0145 10/30/19 1149  WBC 11.6* 10.0    Microbiology Recent Results (from the past 240 hour(s))  SARS Coronavirus 2 by RT PCR (hospital order, performed in [facility] hospital lab) Nasopharyngeal Nasopharyngeal Swab     Status: None   Collection Time: 10/29/19  1:33 AM   Specimen: Nasopharyngeal Swab  Result Value Ref Range Status   SARS Coronavirus 2 NEGATIVE NEGATIVE Final    Comment: (NOTE) SARS-CoV-2 target nucleic acids are NOT DETECTED. The SARS-CoV-2 RNA is generally detectable in upper and lower respiratory specimens during the acute phase of infection. The lowest concentration of SARS-CoV-2 viral copies this assay can detect is  250 copies / mL. A negative result does not preclude SARS-CoV-2 infection and should not be used as the sole basis for treatment or other patient management decisions.  A negative result may occur with improper specimen collection / handling, submission of specimen other than nasopharyngeal swab, presence of viral mutation(s)  within the areas targeted by this assay, and inadequate number of viral copies (<250 copies / mL). A negative result must be combined with clinical observations, patient history, and epidemiological information. Fact Sheet for Patients:   BoilerBrush.com.cy Fact Sheet for Healthcare Providers: https://pope.com/ This test is not yet approved or cleared  by the [place] FDA and has been authorized for detection and/or diagnosis of SARS-CoV-2 by FDA under an Emergency Use Authorization (EUA).  This EUA will remain in effect (meaning this test can be used) for the duration of the COVID-19 declaration under Section 564(b)(1) of the Act, 21 U.S.C. section 360bbb-3(b)(1), unless the authorization is terminated or revoked sooner. Performed at [facility], [address] [place], [place] [address]   MRSA PCR Screening     Status: None   Collection Time: 10/30/19  7:49 PM   Specimen: Nasopharyngeal  Result Value Ref Range Status   MRSA by PCR NEGATIVE NEGATIVE Final    Comment:        The GeneXpert MRSA Assay (FDA approved for NASAL specimens only), is one component of a comprehensive MRSA colonization surveillance program. It is not intended to diagnose MRSA infection nor to guide or monitor treatment for MRSA infections. Performed at [facility], [address] [place], [place] [address]     Procedures and diagnostic studies:  No results found.  Medications:   . bacitracin   Topical BID  . carbidopa-levodopa  1 tablet Oral TID  . citalopram  10 mg Oral Daily  . enoxaparin  (LOVENOX) injection  40 mg Subcutaneous Q24H  . finasteride  5 mg Oral Daily  . fluticasone  2 spray Each Nare Daily  . lidocaine  1 patch Transdermal Q24H  . loratadine  10 mg Oral Daily  . mometasone-formoterol  2 puff Inhalation BID  . montelukast  10 mg Oral QHS  . pantoprazole  40 mg Oral Daily  . sodium chloride  1 g Oral BID WC  . tamsulosin  0.4 mg Oral Daily   Continuous Infusions: . sodium chloride       LOS: 0 days   Kia Stavros  [facility]     10/31/2019, 3:50 PM

## 2019-11-01 DIAGNOSIS — S2241XD Multiple fractures of ribs, right side, subsequent encounter for fracture with routine healing: Secondary | ICD-10-CM

## 2019-11-01 LAB — BASIC METABOLIC PANEL
Anion gap: 6 (ref 5–15)
BUN: 20 mg/dL (ref 8–23)
CO2: 24 mmol/L (ref 22–32)
Calcium: 7.5 mg/dL — ABNORMAL LOW (ref 8.9–10.3)
Chloride: 99 mmol/L (ref 98–111)
Creatinine, Ser: 0.6 mg/dL — ABNORMAL LOW (ref 0.61–1.24)
GFR calc Af Amer: >60 mL/min (ref >60)
GFR calc non Af Amer: >60 mL/min (ref >60)
Glucose, Bld: 103 mg/dL — ABNORMAL HIGH (ref 70–99)
Potassium: 3.5 mmol/L (ref 3.5–5.1)
Sodium: 129 mmol/L — ABNORMAL LOW (ref 135–145)

## 2019-11-01 MED ORDER — POTASSIUM CHLORIDE CRYS ER 20 MEQ PO TBCR
40.0000 meq | EXTENDED_RELEASE_TABLET | Freq: Once | ORAL | Status: AC
  Administered 2019-11-01: 40 meq via ORAL
  Filled 2019-11-01: qty 2

## 2019-11-01 NOTE — Progress Notes (Signed)
Progress Note    Brandon Calhoun  KGU:[id number] DOB: May 23, 1930  DOA: 10/28/2019 PCP: Venia Carbon, MD      Brief Narrative:    Medical records reviewed and are as summarized below:  Brandon Calhoun is an 87 y.o. male  with medical history significant for Parkinson's disease on Sinemet, chronic lower extremity venous stasis with balance issues, anxiety, asthma who presented to the emergency room for evaluation of a fall.       Assessment/Plan:   Principal Problem:   Multiple fractures of ribs, right side, initial encounter for closed fracture Active Problems:   Mood disorder (HCC)   Asthma   Parkinson's disease (HCC)   Hyponatremia   Leukocytosis   Accidental fall   Generalized weakness  Multiple fractures of ribs (right 4th to 6th ribs), right side, Accidental fall Analgesics as needed for pain.  PT and OT.  Generalized weakness Continue PT and OT for increased strength, gait training and endurance.    Hyponatremia  Sodium is slowly trending up.  TSH, cortisol and lipid panel were unremarkable.  Urine sodium is low (15) and urine osmolality is high (679).  Hyponatremia is probably from dehydration/hypovolemic hyponatremia.  Continue IV fluids.    Leukocytosis   Anxiety -Continue Celexa for now.  Consider stopping Celexa if hyponatremia persists or worsens.    Asthma -DuoNebs as needed    Parkinson's disease ([place]) -Continue Sinemet      Hypotension with history of hypertension Continue IV fluids.  Aldactone has been held.   Body mass index is 22.53 kg/m.     Family Communication/Anticipated D/C date and plan/Code Status   DVT prophylaxis:  Code Status:  Family Communication: Plan discussed with his daughter, Manuela Schwartz, at the bedside Disposition Plan:    Status is: Inpatient  Remains inpatient appropriate because:IV treatments appropriate due to intensity of illness or inability to take PO   Dispo: The patient is from:  Home              Anticipated d/c is to: SNF              Anticipated d/c date is: 1 day              Patient currently is not medically stable to d/c.                  Subjective:   Patient has no complaints.  No dizziness, nausea or vomiting.  Objective:    Vitals:   10/31/19 1338 10/31/19 2017 11/01/19 0445 11/01/19 0843  BP: (!) 107/57 (!) 110/52 115/65 (!) 115/53  Pulse: 88 75 72 65  Resp:  20 20   Temp:  98.3 F (36.8 C) 97.6 F (36.4 C)   TempSrc:  Oral Oral   SpO2:  93% 93%   Weight:      Height:       No data found.   Intake/Output Summary (Last 24 hours) at 11/01/2019 1121 Last data filed at 11/01/2019 0605 Gross per 24 hour  Intake 571.9 ml  Output 600 ml  Net -28.1 ml   Filed Weights   10/28/19 2241  Weight: 71.2 kg    Exam:  GEN: No acute distress SKIN: wound on left leg, abrasion on left knee EYES: No pallor or icterus ENT: MMM CV: RRR PULM: No rales or wheezing ABD: soft, ND, NT, +BS CNS: AAO x 3, non focal EXT: No edema or tenderness   Data Reviewed:  I have personally reviewed following labs and imaging studies:  Labs: Labs show the following:   Basic Metabolic Panel: Recent Labs  Lab 10/29/19 0145 10/29/19 0145 10/29/19 1353 10/29/19 1353 10/30/19 1149 10/30/19 1149 10/31/19 0900 11/01/19 0523  NA 128*  --  131*  --  128*  --  127* 129*  K 3.7   < > 4.0   < > 3.7   < > 3.8 3.5  CL 97*  --  99  --  98  --  98 99  CO2 22  --  26  --  23  --  21* 24  GLUCOSE 109*  --  113*  --  123*  --  151* 103*  BUN 22  --  16  --  14  --  22 20  CREATININE 0.55*  --  0.76  --  0.52*  --  0.60* 0.60*  CALCIUM 8.2*  --  8.0*  --  8.2*  --  8.1* 7.5*   < > = values in this interval not displayed.   GFR Estimated Creatinine Clearance: 64.3 mL/min (A) (by C-G formula based on SCr of 0.6 mg/dL (L)). Liver Function Tests: No results for input(s): AST, ALT, ALKPHOS, BILITOT, PROT, ALBUMIN in the last 168 hours. No results  for input(s): LIPASE, AMYLASE in the last 168 hours. No results for input(s): AMMONIA in the last 168 hours. Coagulation profile No results for input(s): INR, PROTIME in the last 168 hours.  CBC: Recent Labs  Lab 10/29/19 0145 10/30/19 1149  WBC 11.6* 10.0  NEUTROABS 9.2*  --   HGB 12.2* 13.5  HCT 34.9* 37.4*  MCV 87.9 86.2  PLT 281 298   Cardiac Enzymes: No results for input(s): CKTOTAL, CKMB, CKMBINDEX, TROPONINI in the last 168 hours. BNP (last 3 results) No results for input(s): PROBNP in the last 8760 hours. CBG: No results for input(s): GLUCAP in the last 168 hours. D-Dimer: No results for input(s): DDIMER in the last 72 hours. Hgb A1c: No results for input(s): HGBA1C in the last 72 hours. Lipid Profile: Recent Labs    10/31/19 0900  CHOL 165  HDL 46  LDLCALC 111*  TRIG 40  CHOLHDL 3.6   Thyroid function studies: Recent Labs    10/31/19 0900  TSH 1.095   Anemia work up: No results for input(s): VITAMINB12, FOLATE, FERRITIN, TIBC, IRON, RETICCTPCT in the last 72 hours. Sepsis Labs: Recent Labs  Lab 10/29/19 0145 10/30/19 1149  WBC 11.6* 10.0    Microbiology Recent Results (from the past 240 hour(s))  SARS Coronavirus 2 by RT PCR (hospital order, performed in [facility] hospital lab) Nasopharyngeal Nasopharyngeal Swab     Status: None   Collection Time: 10/29/19  1:33 AM   Specimen: Nasopharyngeal Swab  Result Value Ref Range Status   SARS Coronavirus 2 NEGATIVE NEGATIVE Final    Comment: (NOTE) SARS-CoV-2 target nucleic acids are NOT DETECTED. The SARS-CoV-2 RNA is generally detectable in upper and lower respiratory specimens during the acute phase of infection. The lowest concentration of SARS-CoV-2 viral copies this assay can detect is 250 copies / mL. A negative result does not preclude SARS-CoV-2 infection and should not be used as the sole basis for treatment or other patient management decisions.  A negative result may occur  with improper specimen collection / handling, submission of specimen other than nasopharyngeal swab, presence of viral mutation(s) within the areas targeted by this assay, and inadequate number of viral copies (<250 copies /  mL). A negative result must be combined with clinical observations, patient history, and epidemiological information. Fact Sheet for Patients:   BoilerBrush.com.cy Fact Sheet for Healthcare Providers: https://pope.com/ This test is not yet approved or cleared  by the [place] FDA and has been authorized for detection and/or diagnosis of SARS-CoV-2 by FDA under an Emergency Use Authorization (EUA).  This EUA will remain in effect (meaning this test can be used) for the duration of the COVID-19 declaration under Section 564(b)(1) of the Act, 21 U.S.C. section 360bbb-3(b)(1), unless the authorization is terminated or revoked sooner. Performed at [facility], [address] [place], [place] [address]   MRSA PCR Screening     Status: None   Collection Time: 10/30/19  7:49 PM   Specimen: Nasopharyngeal  Result Value Ref Range Status   MRSA by PCR NEGATIVE NEGATIVE Final    Comment:        The GeneXpert MRSA Assay (FDA approved for NASAL specimens only), is one component of a comprehensive MRSA colonization surveillance program. It is not intended to diagnose MRSA infection nor to guide or monitor treatment for MRSA infections. Performed at [facility], [address] [place], [place] [address]     Procedures and diagnostic studies:  No results found.  Medications:   . bacitracin   Topical BID  . carbidopa-levodopa  1 tablet Oral TID  . citalopram  10 mg Oral Daily  . enoxaparin (LOVENOX) injection  40 mg Subcutaneous Q24H  . fluticasone  2 spray Each Nare Daily  . lidocaine  1 patch Transdermal Q24H  . loratadine  10 mg Oral Daily  . mometasone-formoterol  2 puff Inhalation  BID  . montelukast  10 mg Oral QHS  . pantoprazole  40 mg Oral Daily  . sodium chloride  1 g Oral BID WC  . tamsulosin  0.4 mg Oral Daily   Continuous Infusions: . sodium chloride 75 mL/hr at 11/01/19 0003     LOS: 1 day   Tricha Ruggirello  [facility]     11/01/2019, 11:21 AM

## 2019-11-02 DIAGNOSIS — W19XXXA Unspecified fall, initial encounter: Secondary | ICD-10-CM | POA: Diagnosis not present

## 2019-11-02 DIAGNOSIS — J45909 Unspecified asthma, uncomplicated: Secondary | ICD-10-CM | POA: Diagnosis not present

## 2019-11-02 DIAGNOSIS — R0902 Hypoxemia: Secondary | ICD-10-CM | POA: Diagnosis not present

## 2019-11-02 DIAGNOSIS — I872 Venous insufficiency (chronic) (peripheral): Secondary | ICD-10-CM | POA: Diagnosis not present

## 2019-11-02 DIAGNOSIS — R262 Difficulty in walking, not elsewhere classified: Secondary | ICD-10-CM | POA: Diagnosis not present

## 2019-11-02 DIAGNOSIS — S2241XD Multiple fractures of ribs, right side, subsequent encounter for fracture with routine healing: Secondary | ICD-10-CM | POA: Diagnosis not present

## 2019-11-02 DIAGNOSIS — B351 Tinea unguium: Secondary | ICD-10-CM | POA: Diagnosis not present

## 2019-11-02 DIAGNOSIS — M25512 Pain in left shoulder: Secondary | ICD-10-CM | POA: Diagnosis not present

## 2019-11-02 DIAGNOSIS — R4189 Other symptoms and signs involving cognitive functions and awareness: Secondary | ICD-10-CM | POA: Diagnosis not present

## 2019-11-02 DIAGNOSIS — G20 Parkinson's disease: Secondary | ICD-10-CM | POA: Diagnosis not present

## 2019-11-02 DIAGNOSIS — R278 Other lack of coordination: Secondary | ICD-10-CM | POA: Diagnosis not present

## 2019-11-02 DIAGNOSIS — N401 Enlarged prostate with lower urinary tract symptoms: Secondary | ICD-10-CM | POA: Diagnosis not present

## 2019-11-02 DIAGNOSIS — R2681 Unsteadiness on feet: Secondary | ICD-10-CM | POA: Diagnosis not present

## 2019-11-02 DIAGNOSIS — S2241XA Multiple fractures of ribs, right side, initial encounter for closed fracture: Secondary | ICD-10-CM | POA: Diagnosis not present

## 2019-11-02 DIAGNOSIS — N400 Enlarged prostate without lower urinary tract symptoms: Secondary | ICD-10-CM | POA: Diagnosis not present

## 2019-11-02 DIAGNOSIS — R531 Weakness: Secondary | ICD-10-CM | POA: Diagnosis not present

## 2019-11-02 DIAGNOSIS — W19XXXD Unspecified fall, subsequent encounter: Secondary | ICD-10-CM | POA: Diagnosis not present

## 2019-11-02 DIAGNOSIS — E871 Hypo-osmolality and hyponatremia: Secondary | ICD-10-CM | POA: Diagnosis not present

## 2019-11-02 DIAGNOSIS — M2550 Pain in unspecified joint: Secondary | ICD-10-CM | POA: Diagnosis not present

## 2019-11-02 DIAGNOSIS — M6281 Muscle weakness (generalized): Secondary | ICD-10-CM | POA: Diagnosis not present

## 2019-11-02 DIAGNOSIS — R52 Pain, unspecified: Secondary | ICD-10-CM | POA: Diagnosis not present

## 2019-11-02 DIAGNOSIS — F419 Anxiety disorder, unspecified: Secondary | ICD-10-CM | POA: Diagnosis not present

## 2019-11-02 DIAGNOSIS — Z7401 Bed confinement status: Secondary | ICD-10-CM | POA: Diagnosis not present

## 2019-11-02 DIAGNOSIS — Z741 Need for assistance with personal care: Secondary | ICD-10-CM | POA: Diagnosis not present

## 2019-11-02 DIAGNOSIS — F39 Unspecified mood [affective] disorder: Secondary | ICD-10-CM | POA: Diagnosis not present

## 2019-11-02 LAB — BASIC METABOLIC PANEL
Anion gap: 8 (ref 5–15)
BUN: 17 mg/dL (ref 8–23)
CO2: 22 mmol/L (ref 22–32)
Calcium: 7.7 mg/dL — ABNORMAL LOW (ref 8.9–10.3)
Chloride: 102 mmol/L (ref 98–111)
Creatinine, Ser: 0.61 mg/dL (ref 0.61–1.24)
GFR calc Af Amer: >60 mL/min (ref >60)
GFR calc non Af Amer: >60 mL/min (ref >60)
Glucose, Bld: 100 mg/dL — ABNORMAL HIGH (ref 70–99)
Potassium: 3.9 mmol/L (ref 3.5–5.1)
Sodium: 132 mmol/L — ABNORMAL LOW (ref 135–145)

## 2019-11-02 NOTE — TOC Transition Note (Signed)
Transition of Care [facility]) - CM/SW Discharge Note   Patient Details  Name: NAZEER ROMNEY MRN: [id number] Date of Birth: 05-20-1930  Transition of Care [facility]) CM/SW Contact:  Chapman Fitch, RN Phone Number: 11/02/2019, 10:32 AM   Clinical Narrative:     RNCM spoke with insurance.  She confirms that Berkley Harvey is still pending.  She states that it is under review and assigned to a case Production designer, theatre/television/film.     Barriers to Discharge: ED SNF auth   Patient Goals and CMS Choice Patient states their goals for this hospitalization and ongoing recovery are:: Return to [facility] and rehab there CMS Medicare.gov Compare Post Acute Care list provided to:: Patient Represenative (must comment) Choice offered to / list presented to : Adult Children  Discharge Placement                       Discharge Plan and Services In-house Referral: Clinical Social Work   Post Acute Care Choice: Skilled Nursing Facility          DME Arranged: 3-N-1, Environmental consultant rolling(rolling walker w/ 5" wheels) DME Agency: Other - Comment([employer] - Shaune Leeks [phone]) Date DME Agency Contacted: 10/30/19 Time DME Agency Contacted: [phone] Representative spoke with at DME Agency: Shaune Leeks            Social Determinants of Health (SDOH) Interventions     Readmission Risk Interventions No flowsheet data found.

## 2019-11-02 NOTE — Discharge Summary (Signed)
Physician Discharge Summary  Brandon Calhoun DZH:[id number] DOB: Nov 09, 1929 DOA: 10/28/2019  PCP: [name], MD  Admit date: 10/28/2019 Discharge date: 11/02/2019  Discharge disposition: Skilled nursing facility   Recommendations for Outpatient Follow-Up:   Follow-up with physician at the nursing home within 3 days of discharge   Discharge Diagnosis:   Principal Problem:   Multiple fractures of ribs, right side, initial encounter for closed fracture Active Problems:   Mood disorder (HCC)   Asthma   Parkinson's disease (HCC)   Hyponatremia   Leukocytosis   Accidental fall   Generalized weakness    Discharge Condition: Stable.  Diet recommendation: Heart healthy diet  Code status: Full code.    Hospital Course:   [name] is an 86 y.o. male with medical history significant forParkinson's disease on Sinemet, chronic lower extremity venous stasis with balance issues, anxiety, asthmawho presented to the emergency room for evaluation of a fall.  Work-up revealed right fourth, fifth and sixth rib fractures and hyponatremia.  He was treated with analgesics.  He was also treated with IV fluids and salt tablets.  His blood pressure was low so Aldactone was held temporarily.  Hyponatremia was thought to be due to hypovolemic hyponatremia based on urine sodium and osmolality levels.  He was evaluated by PT and OT who recommended further rehabilitation at a skilled nursing facility.  His condition has improved and he is deemed stable for discharge today.  Of note, patient's daughter said that he no longer takes finasteride so this has been discontinued.  Plan was discussed with the patient and his daughter at the bedside.    Medical Consultants:      Discharge Exam:   Vitals:   11/02/19 0414 11/02/19 1429  BP: (!) 152/74 133/61  Pulse: 64 73  Resp: 16   Temp: 97.6 F (36.4 C) 98 F (36.7 C)  SpO2: 95% 94%   Vitals:   11/01/19 1448 11/01/19  2002 11/02/19 0414 11/02/19 1429  BP: 139/67 125/70 (!) 152/74 133/61  Pulse: 79 71 64 73  Resp: 20 16 16    Temp: 97.9 F (36.6 C) 98.2 F (36.8 C) 97.6 F (36.4 C) 98 F (36.7 C)  TempSrc: Oral Oral Oral Oral  SpO2: 96% 95% 95% 94%  Weight:      Height:         GEN: NAD SKIN: wound/skin tear on left lower leg, abrasion on left knee EYES: EOMI ENT: MMM CV: RRR PULM: CTA B ABD: soft, ND, NT, +BS CNS: AAO x 3, non focal EXT: No edema or tenderness MSK: Mild right sided chest wall tenderness   The results of significant diagnostics from this hospitalization (including imaging, microbiology, ancillary and laboratory) are listed below for reference.     Procedures and Diagnostic Studies:   DG Chest 2 View  Result Date: 10/29/2019 CLINICAL DATA:  Tripped and fell, right rib fracture, right shoulder pain EXAM: CHEST - 2 VIEW COMPARISON:  07/05/2016 FINDINGS: Frontal and lateral views of the chest demonstrate an unremarkable cardiac silhouette. No airspace disease, effusion, or pneumothorax. There are right posterolateral fifth and sixth rib fractures. IMPRESSION: 1. Right posterolateral fifth and sixth rib fractures. 2. Otherwise no acute intrathoracic process. Electronically Signed   By: 07/07/2016 M.D.   On: 10/29/2019 00:48   DG Shoulder Right  Result Date: 10/29/2019 CLINICAL DATA:  Fall and pain EXAM: RIGHT SHOULDER - 2+ VIEW COMPARISON:  None. FINDINGS: There is diffuse osteopenia. A high-riding humeral  head is noted advanced glenohumeral joint osteoarthritis seen with joint space loss. There is a probable nondisplaced fracture seen of the lateral fourth right rib. AC joint arthrosis is noted. No focal soft tissue swelling is seen. IMPRESSION: Nondisplaced lateral fourth right rib fracture. Advanced glenohumeral joint osteoarthritis and a high-riding humeral head. Electronically Signed   By: Jonna Clark M.D.   On: 10/29/2019 00:14   DG Tibia/Fibula Left  Result Date:  10/29/2019 CLINICAL DATA:  Tripped and fell, lacerations EXAM: LEFT TIBIA AND FIBULA - 2 VIEW COMPARISON:  08/20/2016 FINDINGS: Frontal and lateral views of the left tibia and fibula are obtained. No acute displaced fracture. Alignment of the left knee and ankle is anatomic. Mild diffuse subcutaneous edema. No radiopaque foreign bodies. IMPRESSION: 1. Mild subcutaneous edema.  No acute bony abnormality. Electronically Signed   By: Sharlet Salina M.D.   On: 10/29/2019 00:11     Labs:   Basic Metabolic Panel: Recent Labs  Lab 10/29/19 1353 10/29/19 1353 10/30/19 1149 10/30/19 1149 10/31/19 0900 10/31/19 0900 11/01/19 0523 11/02/19 0502  NA 131*  --  128*  --  127*  --  129* 132*  K 4.0   < > 3.7   < > 3.8   < > 3.5 3.9  CL 99  --  98  --  98  --  99 102  CO2 26  --  23  --  21*  --  24 22  GLUCOSE 113*  --  123*  --  151*  --  103* 100*  BUN 16  --  14  --  22  --  20 17  CREATININE 0.76  --  0.52*  --  0.60*  --  0.60* 0.61  CALCIUM 8.0*  --  8.2*  --  8.1*  --  7.5* 7.7*   < > = values in this interval not displayed.   GFR Estimated Creatinine Clearance: 64.3 mL/min (by C-G formula based on SCr of 0.61 mg/dL). Liver Function Tests: No results for input(s): AST, ALT, ALKPHOS, BILITOT, PROT, ALBUMIN in the last 168 hours. No results for input(s): LIPASE, AMYLASE in the last 168 hours. No results for input(s): AMMONIA in the last 168 hours. Coagulation profile No results for input(s): INR, PROTIME in the last 168 hours.  CBC: Recent Labs  Lab 10/29/19 0145 10/30/19 1149  WBC 11.6* 10.0  NEUTROABS 9.2*  --   HGB 12.2* 13.5  HCT 34.9* 37.4*  MCV 87.9 86.2  PLT 281 298   Cardiac Enzymes: No results for input(s): CKTOTAL, CKMB, CKMBINDEX, TROPONINI in the last 168 hours. BNP: Invalid input(s): POCBNP CBG: No results for input(s): GLUCAP in the last 168 hours. D-Dimer No results for input(s): DDIMER in the last 72 hours. Hgb A1c No results for input(s): HGBA1C in the  last 72 hours. Lipid Profile Recent Labs    10/31/19 0900  CHOL 165  HDL 46  LDLCALC 111*  TRIG 40  CHOLHDL 3.6   Thyroid function studies Recent Labs    10/31/19 0900  TSH 1.095   Anemia work up No results for input(s): VITAMINB12, FOLATE, FERRITIN, TIBC, IRON, RETICCTPCT in the last 72 hours. Microbiology Recent Results (from the past 240 hour(s))  SARS Coronavirus 2 by RT PCR (hospital order, performed in [facility] hospital lab) Nasopharyngeal Nasopharyngeal Swab     Status: None   Collection Time: 10/29/19  1:33 AM   Specimen: Nasopharyngeal Swab  Result Value Ref Range Status   SARS Coronavirus  2 NEGATIVE NEGATIVE Final    Comment: (NOTE) SARS-CoV-2 target nucleic acids are NOT DETECTED. The SARS-CoV-2 RNA is generally detectable in upper and lower respiratory specimens during the acute phase of infection. The lowest concentration of SARS-CoV-2 viral copies this assay can detect is 250 copies / mL. A negative result does not preclude SARS-CoV-2 infection and should not be used as the sole basis for treatment or other patient management decisions.  A negative result may occur with improper specimen collection / handling, submission of specimen other than nasopharyngeal swab, presence of viral mutation(s) within the areas targeted by this assay, and inadequate number of viral copies (<250 copies / mL). A negative result must be combined with clinical observations, patient history, and epidemiological information. Fact Sheet for Patients:   BoilerBrush.com.cy Fact Sheet for Healthcare Providers: https://pope.com/ This test is not yet approved or cleared  by the [place] FDA and has been authorized for detection and/or diagnosis of SARS-CoV-2 by FDA under an Emergency Use Authorization (EUA).  This EUA will remain in effect (meaning this test can be used) for the duration of the COVID-19 declaration under Section  564(b)(1) of the Act, 21 U.S.C. section 360bbb-3(b)(1), unless the authorization is terminated or revoked sooner. Performed at [facility], [address] [place], [place] [address]   MRSA PCR Screening     Status: None   Collection Time: 10/30/19  7:49 PM   Specimen: Nasopharyngeal  Result Value Ref Range Status   MRSA by PCR NEGATIVE NEGATIVE Final    Comment:        The GeneXpert MRSA Assay (FDA approved for NASAL specimens only), is one component of a comprehensive MRSA colonization surveillance program. It is not intended to diagnose MRSA infection nor to guide or monitor treatment for MRSA infections. Performed at [facility], [address] [place], [place] [address]      Discharge Instructions:   Discharge Instructions    Diet - low sodium heart healthy   Complete by: As directed    Discharge instructions   Complete by: As directed    Follow up with physician at the nursing home within 3 days of discharge. Repeat BMP/ sodium level within 3 to 4 days of discharge   Increase activity slowly   Complete by: As directed      Allergies as of 11/02/2019   No Known Allergies     Medication List    STOP taking these medications   bismuth subsalicylate 262 MG/15ML suspension Commonly known as: PEPTO BISMOL   Dextromethorphan-guaiFENesin 10-100 MG/5ML Soln   diphenhydrAMINE 25 mg capsule Commonly known as: BENADRYL   finasteride 5 MG tablet Commonly known as: PROSCAR   loperamide 2 MG capsule Commonly known as: IMODIUM   nystatin powder Commonly known as: MYCOSTATIN/NYSTOP   ondansetron 4 MG tablet Commonly known as: ZOFRAN     TAKE these medications   acetaminophen 325 MG tablet Commonly known as: TYLENOL Take 650 mg by mouth every 6 (six) hours as needed.   albuterol 108 (90 Base) MCG/ACT inhaler Commonly known as: VENTOLIN HFA Inhale 2 puffs into the lungs 3 (three) times daily as needed for wheezing or shortness of  breath.   alum & mag hydroxide-simeth 200-200-20 MG/5ML suspension Commonly known as: MAALOX/MYLANTA Take 30 mLs by mouth every 6 (six) hours as needed for indigestion or heartburn.   budesonide-formoterol 160-4.5 MCG/ACT inhaler Commonly known as: SYMBICORT Inhale 2 puffs into the lungs 2 (two) times daily.   carbamide  peroxide 6.5 % OTIC solution Commonly known as: DEBROX Place 5 drops into both ears as needed.   carbidopa-levodopa 25-100 MG tablet Commonly known as: SINEMET IR Take 1 tablet by mouth 3 (three) times daily.   cetirizine 10 MG tablet Commonly known as: ZYRTEC Take 10 mg by mouth daily as needed for allergies.   citalopram 10 MG tablet Commonly known as: CeleXA Take 1 tablet (10 mg total) by mouth daily.   fluticasone 50 MCG/ACT nasal spray Commonly known as: FLONASE Place 2 sprays into the nose daily.   Glucose 15 GM/32ML Gel as needed. Give 1 packet by mouth as needed for low blood sugar   Milk of Magnesia 400 MG/5ML suspension Generic drug: magnesium hydroxide Take 30 mLs by mouth daily as needed for mild constipation.   montelukast 10 MG tablet Commonly known as: SINGULAIR Take 10 mg by mouth at bedtime.   omeprazole 20 MG capsule Commonly known as: PRILOSEC Take 1 capsule by mouth daily.   spironolactone 25 MG tablet Commonly known as: ALDACTONE Take 25 mg by mouth daily.   tamsulosin 0.4 MG Caps capsule Commonly known as: FLOMAX Take 0.4 mg by mouth daily.            Durable Medical Equipment  (From admission, onward)         Start     Ordered   10/30/19 1042  For home use only DME 4 wheeled rolling walker with seat  Once    Question:  Patient needs a walker to treat with the following condition  Answer:  Difficulty walking   10/30/19 1041   10/30/19 1041  For home use only DME Walker  Once    Question:  Patient needs a walker to treat with the following condition  Answer:  Difficulty walking   10/30/19 1041          Follow-up Information    Venia Carbon, MD. Schedule an appointment as soon as possible for a visit in 2 days.   Specialties: Internal Medicine, Pediatrics Contact information: [place] [phone]            Time coordinating discharge: 28 minutes  Signed:  Jennye Boroughs  [facility] 11/02/2019, 2:50 PM

## 2019-11-04 DIAGNOSIS — F39 Unspecified mood [affective] disorder: Secondary | ICD-10-CM | POA: Diagnosis not present

## 2019-11-04 DIAGNOSIS — J45909 Unspecified asthma, uncomplicated: Secondary | ICD-10-CM

## 2019-11-04 DIAGNOSIS — N401 Enlarged prostate with lower urinary tract symptoms: Secondary | ICD-10-CM | POA: Diagnosis not present

## 2019-11-04 DIAGNOSIS — I872 Venous insufficiency (chronic) (peripheral): Secondary | ICD-10-CM | POA: Diagnosis not present

## 2019-11-04 DIAGNOSIS — G20 Parkinson's disease: Secondary | ICD-10-CM | POA: Diagnosis not present

## 2019-11-04 DIAGNOSIS — S2241XS Multiple fractures of ribs, right side, sequela: Secondary | ICD-10-CM

## 2019-11-04 DIAGNOSIS — Y92129 Unspecified place in nursing home as the place of occurrence of the external cause: Secondary | ICD-10-CM

## 2019-11-11 DIAGNOSIS — S2241XS Multiple fractures of ribs, right side, sequela: Secondary | ICD-10-CM

## 2019-11-11 DIAGNOSIS — N400 Enlarged prostate without lower urinary tract symptoms: Secondary | ICD-10-CM

## 2019-11-11 DIAGNOSIS — E441 Mild protein-calorie malnutrition: Secondary | ICD-10-CM

## 2019-11-11 DIAGNOSIS — F39 Unspecified mood [affective] disorder: Secondary | ICD-10-CM

## 2019-11-11 DIAGNOSIS — E871 Hypo-osmolality and hyponatremia: Secondary | ICD-10-CM

## 2019-11-11 DIAGNOSIS — G20 Parkinson's disease: Secondary | ICD-10-CM

## 2019-11-20 DIAGNOSIS — B351 Tinea unguium: Secondary | ICD-10-CM

## 2019-11-24 DIAGNOSIS — S2241XD Multiple fractures of ribs, right side, subsequent encounter for fracture with routine healing: Secondary | ICD-10-CM | POA: Diagnosis not present

## 2019-11-24 DIAGNOSIS — F39 Unspecified mood [affective] disorder: Secondary | ICD-10-CM | POA: Diagnosis not present

## 2019-11-24 DIAGNOSIS — F419 Anxiety disorder, unspecified: Secondary | ICD-10-CM | POA: Diagnosis not present

## 2019-11-24 DIAGNOSIS — R2681 Unsteadiness on feet: Secondary | ICD-10-CM | POA: Diagnosis not present

## 2019-11-24 DIAGNOSIS — Z741 Need for assistance with personal care: Secondary | ICD-10-CM | POA: Diagnosis not present

## 2019-11-24 DIAGNOSIS — G20 Parkinson's disease: Secondary | ICD-10-CM | POA: Diagnosis not present

## 2019-11-24 DIAGNOSIS — R262 Difficulty in walking, not elsewhere classified: Secondary | ICD-10-CM | POA: Diagnosis not present

## 2019-11-24 DIAGNOSIS — R4189 Other symptoms and signs involving cognitive functions and awareness: Secondary | ICD-10-CM | POA: Diagnosis not present

## 2019-11-24 DIAGNOSIS — M6281 Muscle weakness (generalized): Secondary | ICD-10-CM | POA: Diagnosis not present

## 2019-11-24 DIAGNOSIS — W19XXXD Unspecified fall, subsequent encounter: Secondary | ICD-10-CM | POA: Diagnosis not present

## 2019-11-24 DIAGNOSIS — R278 Other lack of coordination: Secondary | ICD-10-CM | POA: Diagnosis not present

## 2019-11-25 DIAGNOSIS — R262 Difficulty in walking, not elsewhere classified: Secondary | ICD-10-CM | POA: Diagnosis not present

## 2019-11-25 DIAGNOSIS — R4189 Other symptoms and signs involving cognitive functions and awareness: Secondary | ICD-10-CM | POA: Diagnosis not present

## 2019-11-25 DIAGNOSIS — G20 Parkinson's disease: Secondary | ICD-10-CM | POA: Diagnosis not present

## 2019-11-25 DIAGNOSIS — F39 Unspecified mood [affective] disorder: Secondary | ICD-10-CM | POA: Diagnosis not present

## 2019-11-25 DIAGNOSIS — F419 Anxiety disorder, unspecified: Secondary | ICD-10-CM | POA: Diagnosis not present

## 2019-11-25 DIAGNOSIS — R278 Other lack of coordination: Secondary | ICD-10-CM | POA: Diagnosis not present

## 2019-11-25 DIAGNOSIS — S2241XD Multiple fractures of ribs, right side, subsequent encounter for fracture with routine healing: Secondary | ICD-10-CM | POA: Diagnosis not present

## 2019-11-25 DIAGNOSIS — R2681 Unsteadiness on feet: Secondary | ICD-10-CM | POA: Diagnosis not present

## 2019-11-25 DIAGNOSIS — Z741 Need for assistance with personal care: Secondary | ICD-10-CM | POA: Diagnosis not present

## 2019-11-25 DIAGNOSIS — M6281 Muscle weakness (generalized): Secondary | ICD-10-CM | POA: Diagnosis not present

## 2019-11-25 DIAGNOSIS — W19XXXD Unspecified fall, subsequent encounter: Secondary | ICD-10-CM | POA: Diagnosis not present

## 2019-11-30 DIAGNOSIS — R4189 Other symptoms and signs involving cognitive functions and awareness: Secondary | ICD-10-CM | POA: Diagnosis not present

## 2019-11-30 DIAGNOSIS — Z741 Need for assistance with personal care: Secondary | ICD-10-CM | POA: Diagnosis not present

## 2019-11-30 DIAGNOSIS — G20 Parkinson's disease: Secondary | ICD-10-CM | POA: Diagnosis not present

## 2019-11-30 DIAGNOSIS — R2681 Unsteadiness on feet: Secondary | ICD-10-CM | POA: Diagnosis not present

## 2019-11-30 DIAGNOSIS — W19XXXD Unspecified fall, subsequent encounter: Secondary | ICD-10-CM | POA: Diagnosis not present

## 2019-11-30 DIAGNOSIS — F39 Unspecified mood [affective] disorder: Secondary | ICD-10-CM | POA: Diagnosis not present

## 2019-11-30 DIAGNOSIS — R278 Other lack of coordination: Secondary | ICD-10-CM | POA: Diagnosis not present

## 2019-11-30 DIAGNOSIS — M6281 Muscle weakness (generalized): Secondary | ICD-10-CM | POA: Diagnosis not present

## 2019-11-30 DIAGNOSIS — S2241XD Multiple fractures of ribs, right side, subsequent encounter for fracture with routine healing: Secondary | ICD-10-CM | POA: Diagnosis not present

## 2019-11-30 DIAGNOSIS — F419 Anxiety disorder, unspecified: Secondary | ICD-10-CM | POA: Diagnosis not present

## 2019-11-30 DIAGNOSIS — R262 Difficulty in walking, not elsewhere classified: Secondary | ICD-10-CM | POA: Diagnosis not present

## 2019-12-02 DIAGNOSIS — W19XXXD Unspecified fall, subsequent encounter: Secondary | ICD-10-CM | POA: Diagnosis not present

## 2019-12-02 DIAGNOSIS — R278 Other lack of coordination: Secondary | ICD-10-CM | POA: Diagnosis not present

## 2019-12-02 DIAGNOSIS — R262 Difficulty in walking, not elsewhere classified: Secondary | ICD-10-CM | POA: Diagnosis not present

## 2019-12-02 DIAGNOSIS — Z741 Need for assistance with personal care: Secondary | ICD-10-CM | POA: Diagnosis not present

## 2019-12-02 DIAGNOSIS — F39 Unspecified mood [affective] disorder: Secondary | ICD-10-CM | POA: Diagnosis not present

## 2019-12-02 DIAGNOSIS — F419 Anxiety disorder, unspecified: Secondary | ICD-10-CM | POA: Diagnosis not present

## 2019-12-02 DIAGNOSIS — M6281 Muscle weakness (generalized): Secondary | ICD-10-CM | POA: Diagnosis not present

## 2019-12-02 DIAGNOSIS — G20 Parkinson's disease: Secondary | ICD-10-CM | POA: Diagnosis not present

## 2019-12-02 DIAGNOSIS — R2681 Unsteadiness on feet: Secondary | ICD-10-CM | POA: Diagnosis not present

## 2019-12-02 DIAGNOSIS — R4189 Other symptoms and signs involving cognitive functions and awareness: Secondary | ICD-10-CM | POA: Diagnosis not present

## 2019-12-02 DIAGNOSIS — S2241XD Multiple fractures of ribs, right side, subsequent encounter for fracture with routine healing: Secondary | ICD-10-CM | POA: Diagnosis not present

## 2019-12-03 DIAGNOSIS — Z741 Need for assistance with personal care: Secondary | ICD-10-CM | POA: Diagnosis not present

## 2019-12-03 DIAGNOSIS — F419 Anxiety disorder, unspecified: Secondary | ICD-10-CM | POA: Diagnosis not present

## 2019-12-03 DIAGNOSIS — S2241XD Multiple fractures of ribs, right side, subsequent encounter for fracture with routine healing: Secondary | ICD-10-CM | POA: Diagnosis not present

## 2019-12-03 DIAGNOSIS — R2681 Unsteadiness on feet: Secondary | ICD-10-CM | POA: Diagnosis not present

## 2019-12-03 DIAGNOSIS — M6281 Muscle weakness (generalized): Secondary | ICD-10-CM | POA: Diagnosis not present

## 2019-12-03 DIAGNOSIS — R4189 Other symptoms and signs involving cognitive functions and awareness: Secondary | ICD-10-CM | POA: Diagnosis not present

## 2019-12-03 DIAGNOSIS — W19XXXD Unspecified fall, subsequent encounter: Secondary | ICD-10-CM | POA: Diagnosis not present

## 2019-12-03 DIAGNOSIS — R262 Difficulty in walking, not elsewhere classified: Secondary | ICD-10-CM | POA: Diagnosis not present

## 2019-12-03 DIAGNOSIS — F39 Unspecified mood [affective] disorder: Secondary | ICD-10-CM | POA: Diagnosis not present

## 2019-12-03 DIAGNOSIS — R278 Other lack of coordination: Secondary | ICD-10-CM | POA: Diagnosis not present

## 2019-12-03 DIAGNOSIS — G20 Parkinson's disease: Secondary | ICD-10-CM | POA: Diagnosis not present

## 2019-12-04 DIAGNOSIS — R262 Difficulty in walking, not elsewhere classified: Secondary | ICD-10-CM | POA: Diagnosis not present

## 2019-12-04 DIAGNOSIS — W19XXXD Unspecified fall, subsequent encounter: Secondary | ICD-10-CM | POA: Diagnosis not present

## 2019-12-04 DIAGNOSIS — F419 Anxiety disorder, unspecified: Secondary | ICD-10-CM | POA: Diagnosis not present

## 2019-12-04 DIAGNOSIS — R4189 Other symptoms and signs involving cognitive functions and awareness: Secondary | ICD-10-CM | POA: Diagnosis not present

## 2019-12-04 DIAGNOSIS — G20 Parkinson's disease: Secondary | ICD-10-CM | POA: Diagnosis not present

## 2019-12-04 DIAGNOSIS — Z741 Need for assistance with personal care: Secondary | ICD-10-CM | POA: Diagnosis not present

## 2019-12-04 DIAGNOSIS — F39 Unspecified mood [affective] disorder: Secondary | ICD-10-CM | POA: Diagnosis not present

## 2019-12-04 DIAGNOSIS — R2681 Unsteadiness on feet: Secondary | ICD-10-CM | POA: Diagnosis not present

## 2019-12-04 DIAGNOSIS — R278 Other lack of coordination: Secondary | ICD-10-CM | POA: Diagnosis not present

## 2019-12-04 DIAGNOSIS — M6281 Muscle weakness (generalized): Secondary | ICD-10-CM | POA: Diagnosis not present

## 2019-12-04 DIAGNOSIS — S2241XD Multiple fractures of ribs, right side, subsequent encounter for fracture with routine healing: Secondary | ICD-10-CM | POA: Diagnosis not present

## 2019-12-07 DIAGNOSIS — W19XXXD Unspecified fall, subsequent encounter: Secondary | ICD-10-CM | POA: Diagnosis not present

## 2019-12-07 DIAGNOSIS — F419 Anxiety disorder, unspecified: Secondary | ICD-10-CM | POA: Diagnosis not present

## 2019-12-07 DIAGNOSIS — R4189 Other symptoms and signs involving cognitive functions and awareness: Secondary | ICD-10-CM | POA: Diagnosis not present

## 2019-12-07 DIAGNOSIS — F39 Unspecified mood [affective] disorder: Secondary | ICD-10-CM | POA: Diagnosis not present

## 2019-12-07 DIAGNOSIS — R2681 Unsteadiness on feet: Secondary | ICD-10-CM | POA: Diagnosis not present

## 2019-12-07 DIAGNOSIS — M6281 Muscle weakness (generalized): Secondary | ICD-10-CM | POA: Diagnosis not present

## 2019-12-07 DIAGNOSIS — G20 Parkinson's disease: Secondary | ICD-10-CM | POA: Diagnosis not present

## 2019-12-07 DIAGNOSIS — R278 Other lack of coordination: Secondary | ICD-10-CM | POA: Diagnosis not present

## 2019-12-07 DIAGNOSIS — Z741 Need for assistance with personal care: Secondary | ICD-10-CM | POA: Diagnosis not present

## 2019-12-07 DIAGNOSIS — R262 Difficulty in walking, not elsewhere classified: Secondary | ICD-10-CM | POA: Diagnosis not present

## 2019-12-07 DIAGNOSIS — S2241XD Multiple fractures of ribs, right side, subsequent encounter for fracture with routine healing: Secondary | ICD-10-CM | POA: Diagnosis not present

## 2019-12-08 DIAGNOSIS — F419 Anxiety disorder, unspecified: Secondary | ICD-10-CM | POA: Diagnosis not present

## 2019-12-08 DIAGNOSIS — G20 Parkinson's disease: Secondary | ICD-10-CM | POA: Diagnosis not present

## 2019-12-08 DIAGNOSIS — Z741 Need for assistance with personal care: Secondary | ICD-10-CM | POA: Diagnosis not present

## 2019-12-08 DIAGNOSIS — R4189 Other symptoms and signs involving cognitive functions and awareness: Secondary | ICD-10-CM | POA: Diagnosis not present

## 2019-12-08 DIAGNOSIS — R262 Difficulty in walking, not elsewhere classified: Secondary | ICD-10-CM | POA: Diagnosis not present

## 2019-12-08 DIAGNOSIS — M6281 Muscle weakness (generalized): Secondary | ICD-10-CM | POA: Diagnosis not present

## 2019-12-08 DIAGNOSIS — F39 Unspecified mood [affective] disorder: Secondary | ICD-10-CM | POA: Diagnosis not present

## 2019-12-08 DIAGNOSIS — R2681 Unsteadiness on feet: Secondary | ICD-10-CM | POA: Diagnosis not present

## 2019-12-08 DIAGNOSIS — W19XXXD Unspecified fall, subsequent encounter: Secondary | ICD-10-CM | POA: Diagnosis not present

## 2019-12-08 DIAGNOSIS — S2241XD Multiple fractures of ribs, right side, subsequent encounter for fracture with routine healing: Secondary | ICD-10-CM | POA: Diagnosis not present

## 2019-12-08 DIAGNOSIS — R278 Other lack of coordination: Secondary | ICD-10-CM | POA: Diagnosis not present

## 2019-12-09 DIAGNOSIS — F419 Anxiety disorder, unspecified: Secondary | ICD-10-CM | POA: Diagnosis not present

## 2019-12-09 DIAGNOSIS — F39 Unspecified mood [affective] disorder: Secondary | ICD-10-CM | POA: Diagnosis not present

## 2019-12-09 DIAGNOSIS — W19XXXD Unspecified fall, subsequent encounter: Secondary | ICD-10-CM | POA: Diagnosis not present

## 2019-12-09 DIAGNOSIS — R2681 Unsteadiness on feet: Secondary | ICD-10-CM | POA: Diagnosis not present

## 2019-12-09 DIAGNOSIS — R4189 Other symptoms and signs involving cognitive functions and awareness: Secondary | ICD-10-CM | POA: Diagnosis not present

## 2019-12-09 DIAGNOSIS — R278 Other lack of coordination: Secondary | ICD-10-CM | POA: Diagnosis not present

## 2019-12-09 DIAGNOSIS — M6281 Muscle weakness (generalized): Secondary | ICD-10-CM | POA: Diagnosis not present

## 2019-12-09 DIAGNOSIS — G20 Parkinson's disease: Secondary | ICD-10-CM | POA: Diagnosis not present

## 2019-12-09 DIAGNOSIS — R262 Difficulty in walking, not elsewhere classified: Secondary | ICD-10-CM | POA: Diagnosis not present

## 2019-12-09 DIAGNOSIS — Z741 Need for assistance with personal care: Secondary | ICD-10-CM | POA: Diagnosis not present

## 2019-12-09 DIAGNOSIS — S2241XD Multiple fractures of ribs, right side, subsequent encounter for fracture with routine healing: Secondary | ICD-10-CM | POA: Diagnosis not present

## 2019-12-10 DIAGNOSIS — G20 Parkinson's disease: Secondary | ICD-10-CM | POA: Diagnosis not present

## 2019-12-10 DIAGNOSIS — W19XXXD Unspecified fall, subsequent encounter: Secondary | ICD-10-CM | POA: Diagnosis not present

## 2019-12-10 DIAGNOSIS — Z741 Need for assistance with personal care: Secondary | ICD-10-CM | POA: Diagnosis not present

## 2019-12-10 DIAGNOSIS — F39 Unspecified mood [affective] disorder: Secondary | ICD-10-CM | POA: Diagnosis not present

## 2019-12-10 DIAGNOSIS — R262 Difficulty in walking, not elsewhere classified: Secondary | ICD-10-CM | POA: Diagnosis not present

## 2019-12-10 DIAGNOSIS — S2241XD Multiple fractures of ribs, right side, subsequent encounter for fracture with routine healing: Secondary | ICD-10-CM | POA: Diagnosis not present

## 2019-12-10 DIAGNOSIS — M6281 Muscle weakness (generalized): Secondary | ICD-10-CM | POA: Diagnosis not present

## 2019-12-10 DIAGNOSIS — R2681 Unsteadiness on feet: Secondary | ICD-10-CM | POA: Diagnosis not present

## 2019-12-10 DIAGNOSIS — R278 Other lack of coordination: Secondary | ICD-10-CM | POA: Diagnosis not present

## 2019-12-10 DIAGNOSIS — F419 Anxiety disorder, unspecified: Secondary | ICD-10-CM | POA: Diagnosis not present

## 2019-12-10 DIAGNOSIS — R4189 Other symptoms and signs involving cognitive functions and awareness: Secondary | ICD-10-CM | POA: Diagnosis not present

## 2019-12-11 DIAGNOSIS — R4189 Other symptoms and signs involving cognitive functions and awareness: Secondary | ICD-10-CM | POA: Diagnosis not present

## 2019-12-11 DIAGNOSIS — M6281 Muscle weakness (generalized): Secondary | ICD-10-CM | POA: Diagnosis not present

## 2019-12-11 DIAGNOSIS — W19XXXD Unspecified fall, subsequent encounter: Secondary | ICD-10-CM | POA: Diagnosis not present

## 2019-12-11 DIAGNOSIS — F419 Anxiety disorder, unspecified: Secondary | ICD-10-CM | POA: Diagnosis not present

## 2019-12-11 DIAGNOSIS — S2241XD Multiple fractures of ribs, right side, subsequent encounter for fracture with routine healing: Secondary | ICD-10-CM | POA: Diagnosis not present

## 2019-12-11 DIAGNOSIS — F39 Unspecified mood [affective] disorder: Secondary | ICD-10-CM | POA: Diagnosis not present

## 2019-12-11 DIAGNOSIS — Z741 Need for assistance with personal care: Secondary | ICD-10-CM | POA: Diagnosis not present

## 2019-12-11 DIAGNOSIS — R278 Other lack of coordination: Secondary | ICD-10-CM | POA: Diagnosis not present

## 2019-12-11 DIAGNOSIS — R2681 Unsteadiness on feet: Secondary | ICD-10-CM | POA: Diagnosis not present

## 2019-12-11 DIAGNOSIS — G20 Parkinson's disease: Secondary | ICD-10-CM | POA: Diagnosis not present

## 2019-12-11 DIAGNOSIS — R262 Difficulty in walking, not elsewhere classified: Secondary | ICD-10-CM | POA: Diagnosis not present

## 2019-12-14 DIAGNOSIS — M6281 Muscle weakness (generalized): Secondary | ICD-10-CM | POA: Diagnosis not present

## 2019-12-14 DIAGNOSIS — F419 Anxiety disorder, unspecified: Secondary | ICD-10-CM | POA: Diagnosis not present

## 2019-12-14 DIAGNOSIS — R2681 Unsteadiness on feet: Secondary | ICD-10-CM | POA: Diagnosis not present

## 2019-12-14 DIAGNOSIS — G20 Parkinson's disease: Secondary | ICD-10-CM | POA: Diagnosis not present

## 2019-12-14 DIAGNOSIS — R4189 Other symptoms and signs involving cognitive functions and awareness: Secondary | ICD-10-CM | POA: Diagnosis not present

## 2019-12-14 DIAGNOSIS — S2241XD Multiple fractures of ribs, right side, subsequent encounter for fracture with routine healing: Secondary | ICD-10-CM | POA: Diagnosis not present

## 2019-12-14 DIAGNOSIS — F39 Unspecified mood [affective] disorder: Secondary | ICD-10-CM | POA: Diagnosis not present

## 2019-12-14 DIAGNOSIS — R278 Other lack of coordination: Secondary | ICD-10-CM | POA: Diagnosis not present

## 2019-12-14 DIAGNOSIS — W19XXXD Unspecified fall, subsequent encounter: Secondary | ICD-10-CM | POA: Diagnosis not present

## 2019-12-14 DIAGNOSIS — Z741 Need for assistance with personal care: Secondary | ICD-10-CM | POA: Diagnosis not present

## 2019-12-14 DIAGNOSIS — R262 Difficulty in walking, not elsewhere classified: Secondary | ICD-10-CM | POA: Diagnosis not present

## 2019-12-15 DIAGNOSIS — W19XXXD Unspecified fall, subsequent encounter: Secondary | ICD-10-CM | POA: Diagnosis not present

## 2019-12-15 DIAGNOSIS — F39 Unspecified mood [affective] disorder: Secondary | ICD-10-CM | POA: Diagnosis not present

## 2019-12-15 DIAGNOSIS — R278 Other lack of coordination: Secondary | ICD-10-CM | POA: Diagnosis not present

## 2019-12-15 DIAGNOSIS — R4189 Other symptoms and signs involving cognitive functions and awareness: Secondary | ICD-10-CM | POA: Diagnosis not present

## 2019-12-15 DIAGNOSIS — Z741 Need for assistance with personal care: Secondary | ICD-10-CM | POA: Diagnosis not present

## 2019-12-15 DIAGNOSIS — G20 Parkinson's disease: Secondary | ICD-10-CM | POA: Diagnosis not present

## 2019-12-15 DIAGNOSIS — R262 Difficulty in walking, not elsewhere classified: Secondary | ICD-10-CM | POA: Diagnosis not present

## 2019-12-15 DIAGNOSIS — M6281 Muscle weakness (generalized): Secondary | ICD-10-CM | POA: Diagnosis not present

## 2019-12-15 DIAGNOSIS — R2681 Unsteadiness on feet: Secondary | ICD-10-CM | POA: Diagnosis not present

## 2019-12-15 DIAGNOSIS — S2241XD Multiple fractures of ribs, right side, subsequent encounter for fracture with routine healing: Secondary | ICD-10-CM | POA: Diagnosis not present

## 2019-12-15 DIAGNOSIS — F419 Anxiety disorder, unspecified: Secondary | ICD-10-CM | POA: Diagnosis not present

## 2019-12-16 DIAGNOSIS — Z741 Need for assistance with personal care: Secondary | ICD-10-CM | POA: Diagnosis not present

## 2019-12-16 DIAGNOSIS — F419 Anxiety disorder, unspecified: Secondary | ICD-10-CM | POA: Diagnosis not present

## 2019-12-16 DIAGNOSIS — R2681 Unsteadiness on feet: Secondary | ICD-10-CM | POA: Diagnosis not present

## 2019-12-16 DIAGNOSIS — M6281 Muscle weakness (generalized): Secondary | ICD-10-CM | POA: Diagnosis not present

## 2019-12-16 DIAGNOSIS — R278 Other lack of coordination: Secondary | ICD-10-CM | POA: Diagnosis not present

## 2019-12-16 DIAGNOSIS — W19XXXD Unspecified fall, subsequent encounter: Secondary | ICD-10-CM | POA: Diagnosis not present

## 2019-12-16 DIAGNOSIS — S2241XD Multiple fractures of ribs, right side, subsequent encounter for fracture with routine healing: Secondary | ICD-10-CM | POA: Diagnosis not present

## 2019-12-16 DIAGNOSIS — F39 Unspecified mood [affective] disorder: Secondary | ICD-10-CM | POA: Diagnosis not present

## 2019-12-16 DIAGNOSIS — R4189 Other symptoms and signs involving cognitive functions and awareness: Secondary | ICD-10-CM | POA: Diagnosis not present

## 2019-12-16 DIAGNOSIS — G20 Parkinson's disease: Secondary | ICD-10-CM | POA: Diagnosis not present

## 2019-12-16 DIAGNOSIS — R262 Difficulty in walking, not elsewhere classified: Secondary | ICD-10-CM | POA: Diagnosis not present

## 2019-12-17 DIAGNOSIS — F419 Anxiety disorder, unspecified: Secondary | ICD-10-CM | POA: Diagnosis not present

## 2019-12-17 DIAGNOSIS — R262 Difficulty in walking, not elsewhere classified: Secondary | ICD-10-CM | POA: Diagnosis not present

## 2019-12-17 DIAGNOSIS — F39 Unspecified mood [affective] disorder: Secondary | ICD-10-CM | POA: Diagnosis not present

## 2019-12-17 DIAGNOSIS — G20 Parkinson's disease: Secondary | ICD-10-CM | POA: Diagnosis not present

## 2019-12-17 DIAGNOSIS — M6281 Muscle weakness (generalized): Secondary | ICD-10-CM | POA: Diagnosis not present

## 2019-12-17 DIAGNOSIS — W19XXXD Unspecified fall, subsequent encounter: Secondary | ICD-10-CM | POA: Diagnosis not present

## 2019-12-17 DIAGNOSIS — Z741 Need for assistance with personal care: Secondary | ICD-10-CM | POA: Diagnosis not present

## 2019-12-17 DIAGNOSIS — R2681 Unsteadiness on feet: Secondary | ICD-10-CM | POA: Diagnosis not present

## 2019-12-17 DIAGNOSIS — R4189 Other symptoms and signs involving cognitive functions and awareness: Secondary | ICD-10-CM | POA: Diagnosis not present

## 2019-12-17 DIAGNOSIS — R278 Other lack of coordination: Secondary | ICD-10-CM | POA: Diagnosis not present

## 2019-12-17 DIAGNOSIS — S2241XD Multiple fractures of ribs, right side, subsequent encounter for fracture with routine healing: Secondary | ICD-10-CM | POA: Diagnosis not present

## 2019-12-18 DIAGNOSIS — R2681 Unsteadiness on feet: Secondary | ICD-10-CM | POA: Diagnosis not present

## 2019-12-18 DIAGNOSIS — F419 Anxiety disorder, unspecified: Secondary | ICD-10-CM | POA: Diagnosis not present

## 2019-12-18 DIAGNOSIS — F39 Unspecified mood [affective] disorder: Secondary | ICD-10-CM | POA: Diagnosis not present

## 2019-12-18 DIAGNOSIS — W19XXXD Unspecified fall, subsequent encounter: Secondary | ICD-10-CM | POA: Diagnosis not present

## 2019-12-18 DIAGNOSIS — R262 Difficulty in walking, not elsewhere classified: Secondary | ICD-10-CM | POA: Diagnosis not present

## 2019-12-18 DIAGNOSIS — R4189 Other symptoms and signs involving cognitive functions and awareness: Secondary | ICD-10-CM | POA: Diagnosis not present

## 2019-12-18 DIAGNOSIS — G20 Parkinson's disease: Secondary | ICD-10-CM | POA: Diagnosis not present

## 2019-12-18 DIAGNOSIS — Z741 Need for assistance with personal care: Secondary | ICD-10-CM | POA: Diagnosis not present

## 2019-12-18 DIAGNOSIS — M6281 Muscle weakness (generalized): Secondary | ICD-10-CM | POA: Diagnosis not present

## 2019-12-18 DIAGNOSIS — R278 Other lack of coordination: Secondary | ICD-10-CM | POA: Diagnosis not present

## 2019-12-18 DIAGNOSIS — S2241XD Multiple fractures of ribs, right side, subsequent encounter for fracture with routine healing: Secondary | ICD-10-CM | POA: Diagnosis not present

## 2019-12-21 DIAGNOSIS — W19XXXD Unspecified fall, subsequent encounter: Secondary | ICD-10-CM | POA: Diagnosis not present

## 2019-12-21 DIAGNOSIS — S2241XD Multiple fractures of ribs, right side, subsequent encounter for fracture with routine healing: Secondary | ICD-10-CM | POA: Diagnosis not present

## 2019-12-21 DIAGNOSIS — R262 Difficulty in walking, not elsewhere classified: Secondary | ICD-10-CM | POA: Diagnosis not present

## 2019-12-21 DIAGNOSIS — R2681 Unsteadiness on feet: Secondary | ICD-10-CM | POA: Diagnosis not present

## 2019-12-21 DIAGNOSIS — G20 Parkinson's disease: Secondary | ICD-10-CM | POA: Diagnosis not present

## 2019-12-21 DIAGNOSIS — R278 Other lack of coordination: Secondary | ICD-10-CM | POA: Diagnosis not present

## 2019-12-21 DIAGNOSIS — F419 Anxiety disorder, unspecified: Secondary | ICD-10-CM | POA: Diagnosis not present

## 2019-12-21 DIAGNOSIS — M6281 Muscle weakness (generalized): Secondary | ICD-10-CM | POA: Diagnosis not present

## 2019-12-21 DIAGNOSIS — Z741 Need for assistance with personal care: Secondary | ICD-10-CM | POA: Diagnosis not present

## 2019-12-21 DIAGNOSIS — R4189 Other symptoms and signs involving cognitive functions and awareness: Secondary | ICD-10-CM | POA: Diagnosis not present

## 2019-12-21 DIAGNOSIS — F39 Unspecified mood [affective] disorder: Secondary | ICD-10-CM | POA: Diagnosis not present

## 2019-12-22 DIAGNOSIS — R2681 Unsteadiness on feet: Secondary | ICD-10-CM | POA: Diagnosis not present

## 2019-12-22 DIAGNOSIS — Z741 Need for assistance with personal care: Secondary | ICD-10-CM | POA: Diagnosis not present

## 2019-12-22 DIAGNOSIS — F419 Anxiety disorder, unspecified: Secondary | ICD-10-CM | POA: Diagnosis not present

## 2019-12-22 DIAGNOSIS — R4189 Other symptoms and signs involving cognitive functions and awareness: Secondary | ICD-10-CM | POA: Diagnosis not present

## 2019-12-22 DIAGNOSIS — S2241XD Multiple fractures of ribs, right side, subsequent encounter for fracture with routine healing: Secondary | ICD-10-CM | POA: Diagnosis not present

## 2019-12-22 DIAGNOSIS — F39 Unspecified mood [affective] disorder: Secondary | ICD-10-CM | POA: Diagnosis not present

## 2019-12-22 DIAGNOSIS — W19XXXD Unspecified fall, subsequent encounter: Secondary | ICD-10-CM | POA: Diagnosis not present

## 2019-12-22 DIAGNOSIS — R278 Other lack of coordination: Secondary | ICD-10-CM | POA: Diagnosis not present

## 2019-12-22 DIAGNOSIS — M6281 Muscle weakness (generalized): Secondary | ICD-10-CM | POA: Diagnosis not present

## 2019-12-22 DIAGNOSIS — G20 Parkinson's disease: Secondary | ICD-10-CM | POA: Diagnosis not present

## 2019-12-22 DIAGNOSIS — R262 Difficulty in walking, not elsewhere classified: Secondary | ICD-10-CM | POA: Diagnosis not present

## 2019-12-23 DIAGNOSIS — R278 Other lack of coordination: Secondary | ICD-10-CM | POA: Diagnosis not present

## 2019-12-23 DIAGNOSIS — F39 Unspecified mood [affective] disorder: Secondary | ICD-10-CM | POA: Diagnosis not present

## 2019-12-23 DIAGNOSIS — W19XXXD Unspecified fall, subsequent encounter: Secondary | ICD-10-CM | POA: Diagnosis not present

## 2019-12-23 DIAGNOSIS — G20 Parkinson's disease: Secondary | ICD-10-CM | POA: Diagnosis not present

## 2019-12-23 DIAGNOSIS — R262 Difficulty in walking, not elsewhere classified: Secondary | ICD-10-CM | POA: Diagnosis not present

## 2019-12-23 DIAGNOSIS — M6281 Muscle weakness (generalized): Secondary | ICD-10-CM | POA: Diagnosis not present

## 2019-12-23 DIAGNOSIS — S2241XD Multiple fractures of ribs, right side, subsequent encounter for fracture with routine healing: Secondary | ICD-10-CM | POA: Diagnosis not present

## 2019-12-23 DIAGNOSIS — F419 Anxiety disorder, unspecified: Secondary | ICD-10-CM | POA: Diagnosis not present

## 2019-12-23 DIAGNOSIS — R2681 Unsteadiness on feet: Secondary | ICD-10-CM | POA: Diagnosis not present

## 2019-12-23 DIAGNOSIS — R4189 Other symptoms and signs involving cognitive functions and awareness: Secondary | ICD-10-CM | POA: Diagnosis not present

## 2019-12-23 DIAGNOSIS — Z741 Need for assistance with personal care: Secondary | ICD-10-CM | POA: Diagnosis not present

## 2019-12-24 DIAGNOSIS — F39 Unspecified mood [affective] disorder: Secondary | ICD-10-CM | POA: Diagnosis not present

## 2019-12-24 DIAGNOSIS — M6281 Muscle weakness (generalized): Secondary | ICD-10-CM | POA: Diagnosis not present

## 2019-12-24 DIAGNOSIS — G20 Parkinson's disease: Secondary | ICD-10-CM | POA: Diagnosis not present

## 2019-12-24 DIAGNOSIS — S2241XD Multiple fractures of ribs, right side, subsequent encounter for fracture with routine healing: Secondary | ICD-10-CM | POA: Diagnosis not present

## 2019-12-24 DIAGNOSIS — R2681 Unsteadiness on feet: Secondary | ICD-10-CM | POA: Diagnosis not present

## 2019-12-24 DIAGNOSIS — F419 Anxiety disorder, unspecified: Secondary | ICD-10-CM | POA: Diagnosis not present

## 2019-12-24 DIAGNOSIS — R4189 Other symptoms and signs involving cognitive functions and awareness: Secondary | ICD-10-CM | POA: Diagnosis not present

## 2019-12-24 DIAGNOSIS — W19XXXD Unspecified fall, subsequent encounter: Secondary | ICD-10-CM | POA: Diagnosis not present

## 2019-12-24 DIAGNOSIS — R278 Other lack of coordination: Secondary | ICD-10-CM | POA: Diagnosis not present

## 2019-12-24 DIAGNOSIS — R262 Difficulty in walking, not elsewhere classified: Secondary | ICD-10-CM | POA: Diagnosis not present

## 2019-12-24 DIAGNOSIS — Z741 Need for assistance with personal care: Secondary | ICD-10-CM | POA: Diagnosis not present

## 2019-12-28 DIAGNOSIS — F419 Anxiety disorder, unspecified: Secondary | ICD-10-CM | POA: Diagnosis not present

## 2019-12-28 DIAGNOSIS — W19XXXD Unspecified fall, subsequent encounter: Secondary | ICD-10-CM | POA: Diagnosis not present

## 2019-12-28 DIAGNOSIS — F39 Unspecified mood [affective] disorder: Secondary | ICD-10-CM | POA: Diagnosis not present

## 2019-12-28 DIAGNOSIS — R4189 Other symptoms and signs involving cognitive functions and awareness: Secondary | ICD-10-CM | POA: Diagnosis not present

## 2019-12-28 DIAGNOSIS — G20 Parkinson's disease: Secondary | ICD-10-CM | POA: Diagnosis not present

## 2019-12-28 DIAGNOSIS — Z741 Need for assistance with personal care: Secondary | ICD-10-CM | POA: Diagnosis not present

## 2019-12-28 DIAGNOSIS — R278 Other lack of coordination: Secondary | ICD-10-CM | POA: Diagnosis not present

## 2019-12-28 DIAGNOSIS — R262 Difficulty in walking, not elsewhere classified: Secondary | ICD-10-CM | POA: Diagnosis not present

## 2019-12-28 DIAGNOSIS — S2241XD Multiple fractures of ribs, right side, subsequent encounter for fracture with routine healing: Secondary | ICD-10-CM | POA: Diagnosis not present

## 2019-12-28 DIAGNOSIS — R2681 Unsteadiness on feet: Secondary | ICD-10-CM | POA: Diagnosis not present

## 2019-12-28 DIAGNOSIS — M6281 Muscle weakness (generalized): Secondary | ICD-10-CM | POA: Diagnosis not present

## 2019-12-29 DIAGNOSIS — R278 Other lack of coordination: Secondary | ICD-10-CM | POA: Diagnosis not present

## 2019-12-29 DIAGNOSIS — R4189 Other symptoms and signs involving cognitive functions and awareness: Secondary | ICD-10-CM | POA: Diagnosis not present

## 2019-12-29 DIAGNOSIS — S2241XD Multiple fractures of ribs, right side, subsequent encounter for fracture with routine healing: Secondary | ICD-10-CM | POA: Diagnosis not present

## 2019-12-29 DIAGNOSIS — F419 Anxiety disorder, unspecified: Secondary | ICD-10-CM | POA: Diagnosis not present

## 2019-12-29 DIAGNOSIS — F39 Unspecified mood [affective] disorder: Secondary | ICD-10-CM | POA: Diagnosis not present

## 2019-12-29 DIAGNOSIS — R2681 Unsteadiness on feet: Secondary | ICD-10-CM | POA: Diagnosis not present

## 2019-12-29 DIAGNOSIS — M6281 Muscle weakness (generalized): Secondary | ICD-10-CM | POA: Diagnosis not present

## 2019-12-29 DIAGNOSIS — R262 Difficulty in walking, not elsewhere classified: Secondary | ICD-10-CM | POA: Diagnosis not present

## 2019-12-29 DIAGNOSIS — Z741 Need for assistance with personal care: Secondary | ICD-10-CM | POA: Diagnosis not present

## 2019-12-29 DIAGNOSIS — G20 Parkinson's disease: Secondary | ICD-10-CM | POA: Diagnosis not present

## 2019-12-29 DIAGNOSIS — W19XXXD Unspecified fall, subsequent encounter: Secondary | ICD-10-CM | POA: Diagnosis not present

## 2020-01-22 ENCOUNTER — Ambulatory Visit: Payer: Medicare Other | Admitting: Internal Medicine

## 2020-01-22 ENCOUNTER — Other Ambulatory Visit: Payer: Self-pay

## 2020-01-22 VITALS — BP 123/62 | HR 72 | Temp 97.8°F | Resp 18 | Wt 153.2 lb

## 2020-01-22 DIAGNOSIS — S51012A Laceration without foreign body of left elbow, initial encounter: Secondary | ICD-10-CM | POA: Diagnosis not present

## 2020-01-22 DIAGNOSIS — S0101XA Laceration without foreign body of scalp, initial encounter: Secondary | ICD-10-CM | POA: Diagnosis not present

## 2020-01-22 DIAGNOSIS — W19XXXA Unspecified fall, initial encounter: Secondary | ICD-10-CM | POA: Diagnosis not present

## 2020-01-25 ENCOUNTER — Encounter: Payer: Self-pay | Admitting: Internal Medicine

## 2020-01-25 NOTE — Patient Instructions (Signed)

## 2020-01-25 NOTE — Progress Notes (Signed)
Subjective:    Patient ID: Brandon Calhoun, male    DOB: [birth year], 86 y.o.   MRN: [id number]  HPI  Resident seen in [address] s/p fall He reports he was just walking, his legs gave out and he fell, landing on his left side He sustained a laceration to the left side of his head and large skin tear to elbow Wounds were cleansed, laceration and skin tear closed with steri strips, aquacell and Mepilex Resident denies headaches, dizziness or visual changes Reports mild discomfort with dressing changes RN denies redness, swelling, purulent drainage or fever.  Review of Systems      Past Medical History:  Diagnosis Date  . Asthma   . Edema    MILD OF FEET  . GERD (gastroesophageal reflux disease)   . Hypertension   . Mood disorder (HCC)    chronic anxiety    Current Outpatient Medications  Medication Sig Dispense Refill  . acetaminophen (TYLENOL) 325 MG tablet Take 650 mg by mouth every 6 (six) hours as needed.    Marland Kitchen albuterol (PROVENTIL HFA;VENTOLIN HFA) 108 (90 Base) MCG/ACT inhaler Inhale 2 puffs into the lungs 3 (three) times daily as needed for wheezing or shortness of breath.    Marland Kitchen alum & mag hydroxide-simeth (MAALOX/MYLANTA) 200-200-20 MG/5ML suspension Take 30 mLs by mouth every 6 (six) hours as needed for indigestion or heartburn.    . budesonide-formoterol (SYMBICORT) 160-4.5 MCG/ACT inhaler Inhale 2 puffs into the lungs 2 (two) times daily.    . carbamide peroxide (DEBROX) 6.5 % OTIC solution Place 5 drops into both ears as needed.    . carbidopa-levodopa (SINEMET IR) 25-100 MG tablet Take 1 tablet by mouth 3 (three) times daily. 90 tablet 11  . cetirizine (ZYRTEC) 10 MG tablet Take 10 mg by mouth daily as needed for allergies.    . citalopram (CELEXA) 10 MG tablet Take 1 tablet (10 mg total) by mouth daily. 30 tablet 2  . fluticasone (FLONASE) 50 MCG/ACT nasal spray Place 2 sprays into the nose daily.    . Glucose 15 GM/32ML GEL as needed. Give 1 packet by mouth as  needed for low blood sugar    . magnesium hydroxide (MILK OF MAGNESIA) 400 MG/5ML suspension Take 30 mLs by mouth daily as needed for mild constipation.    . montelukast (SINGULAIR) 10 MG tablet Take 10 mg by mouth at bedtime.    Marland Kitchen omeprazole (PRILOSEC) 20 MG capsule Take 1 capsule by mouth daily.    Marland Kitchen spironolactone (ALDACTONE) 25 MG tablet Take 25 mg by mouth daily.     . tamsulosin (FLOMAX) 0.4 MG CAPS capsule Take 0.4 mg by mouth daily.      No current facility-administered medications for this visit.    No Known Allergies  Family History  Problem Relation Age of Onset  . COPD Brother   . Hypertension Neg Hx   . Diabetes Neg Hx   . Heart disease Neg Hx     Social History   Socioeconomic History  . Marital status: Widowed    Spouse name: Not on file  . Number of children: Not on file  . Years of education: Not on file  . Highest education level: Not on file  Occupational History  . 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 Smoking status: Never Smoker  . Smokeless tobacco: Never Used  Substance and Sexual Activity  . Alcohol use: No  . Drug use: No  .  Sexual activity: Not on file  Other Topics Concern  . Not on file  Social History Narrative   Widowed 2003. 1 daughter      Has living will   Daughter Darl Pikes is health care POA   Would accept resuscitation attempts   No tube feeds if cognitively unaware   Social Determinants of Health   Financial Resource Strain:   . Difficulty of Paying Living Expenses:   Food Insecurity:   . Worried About Programme researcher, broadcasting/film/video in the Last Year:   . Barista in the Last Year:   Transportation Needs:   . Freight forwarder (Medical):   Marland Kitchen Lack of Transportation (Non-Medical):   Physical Activity:   . Days of Exercise per Week:   . Minutes of Exercise per Session:   Stress:   . Feeling of Stress :   Social Connections:   . Frequency of Communication with Friends and  Family:   . Frequency of Social Gatherings with Friends and Family:   . Attends Religious Services:   . Active Member of Clubs or Organizations:   . Attends Banker Meetings:   Marland Kitchen Marital Status:   Intimate Partner Violence:   . Fear of Current or Ex-Partner:   . Emotionally Abused:   Marland Kitchen Physically Abused:   . Sexually Abused:      Constitutional: Denies fever, malaise, fatigue, headache or abrupt weight changes.  Respiratory: Denies difficulty breathing, shortness of breath, cough or sputum production.   Cardiovascular: Denies chest pain, chest tightness, palpitations or swelling in the hands or feet.  Musculoskeletal: Pt reports a fall. Denies decrease in range of motion, difficulty with gait, muscle pain or joint pain and swelling.  Skin: Pt reports laceration to head and skin tear to left elbow. Denies redness, rashes, lesions or ulcercations.  Neurological: Pt reports difficulty with balance.  No other specific complaints in a complete review of systems (except as listed in HPI above).  Objective:   Physical Exam   BP 123/62   Pulse 72   Temp 97.8 F (36.6 C)   Resp 18   Wt 153 lb 3.2 oz (69.5 kg)   BMI 21.98 kg/m  Wt Readings from Last 3 Encounters:  01/25/20 153 lb 3.2 oz (69.5 kg)  10/28/19 157 lb (71.2 kg)  10/21/19 157 lb (71.2 kg)    General: Appears his stated age, in NAD. Skin: 2 cm laceration to left side of scalp, intact with steri strips. Large U shaped skin tear to left elbow, does not appear infected. Cardiovascular: Normal rate and rhythm. Pulmonary/Chest: Normal effort and positive vesicular breath sounds. No respiratory distress. No wheezes, rales or ronchi noted.  Musculoskeletal: Normal flexion, extension and rotation of the cervical spine. Normal flexion, extension and rotation of the left elbow. Gait slow, uses rolling walker. Neurological: Alert and oriented.      BMET    Component Value Date/Time   NA 132 (L) 11/02/2019 0502     K 3.9 11/02/2019 0502   CL 102 11/02/2019 0502   CO2 22 11/02/2019 0502   GLUCOSE 100 (H) 11/02/2019 0502   BUN 17 11/02/2019 0502   CREATININE 0.61 11/02/2019 0502   CALCIUM 7.7 (L) 11/02/2019 0502   GFRNONAA >60 11/02/2019 0502   GFRAA >60 11/02/2019 0502    Lipid Panel     Component Value Date/Time   CHOL 165 10/31/2019 0900   TRIG 40 10/31/2019 0900   HDL 46 10/31/2019 0900  CHOLHDL 3.6 10/31/2019 0900   VLDL 8 10/31/2019 0900   LDLCALC 111 (H) 10/31/2019 0900    CBC    Component Value Date/Time   WBC 10.0 10/30/2019 1149   RBC 4.34 10/30/2019 1149   HGB 13.5 10/30/2019 1149   HCT 37.4 (L) 10/30/2019 1149   PLT 298 10/30/2019 1149   MCV 86.2 10/30/2019 1149   MCH 31.1 10/30/2019 1149   MCHC 36.1 (H) 10/30/2019 1149   RDW 13.8 10/30/2019 1149   LYMPHSABS 1.4 10/29/2019 0145   MONOABS 0.9 10/29/2019 0145   EOSABS 0.1 10/29/2019 0145   BASOSABS 0.0 10/29/2019 0145    Hgb A1C No results found for: HGBA1C         Assessment & Plan:   Scalp Laceration, Skin Tear Left Elbow s/p Fall:  Wounds cleansed daily/prn by AL nurse Continue current treatment No indication for imaging or antibiotics for infected wounds at this time Will monitor  Will reassess as needed Nicki Reaper, NP  This visit occurred during the SARS-CoV-2 public health emergency.  Safety protocols were in place, including screening questions prior to the visit, additional usage of staff PPE, and extensive cleaning of exam room while observing appropriate contact time as indicated for disinfecting solutions.

## 2020-01-28 DIAGNOSIS — I872 Venous insufficiency (chronic) (peripheral): Secondary | ICD-10-CM | POA: Diagnosis not present

## 2020-01-28 DIAGNOSIS — R531 Weakness: Secondary | ICD-10-CM | POA: Diagnosis not present

## 2020-01-28 DIAGNOSIS — G20 Parkinson's disease: Secondary | ICD-10-CM | POA: Diagnosis not present

## 2020-01-28 DIAGNOSIS — N390 Urinary tract infection, site not specified: Secondary | ICD-10-CM | POA: Diagnosis not present

## 2020-01-28 DIAGNOSIS — E782 Mixed hyperlipidemia: Secondary | ICD-10-CM | POA: Diagnosis not present

## 2020-01-28 DIAGNOSIS — R05 Cough: Secondary | ICD-10-CM | POA: Diagnosis not present

## 2020-01-28 DIAGNOSIS — F39 Unspecified mood [affective] disorder: Secondary | ICD-10-CM | POA: Diagnosis not present

## 2020-01-29 DIAGNOSIS — L03116 Cellulitis of left lower limb: Secondary | ICD-10-CM | POA: Diagnosis not present

## 2020-01-29 DIAGNOSIS — E871 Hypo-osmolality and hyponatremia: Secondary | ICD-10-CM | POA: Diagnosis not present

## 2020-01-31 ENCOUNTER — Emergency Department: Payer: Medicare Other

## 2020-01-31 ENCOUNTER — Inpatient Hospital Stay: Payer: Medicare Other

## 2020-01-31 ENCOUNTER — Inpatient Hospital Stay
Admission: EM | Admit: 2020-01-31 | Discharge: 2020-02-03 | DRG: 178 | Disposition: A | Discharge status: Skilled Nursing Facility | Payer: Medicare Other | Source: Skilled Nursing Facility | Attending: Internal Medicine | Admitting: Internal Medicine

## 2020-01-31 DIAGNOSIS — S59901A Unspecified injury of right elbow, initial encounter: Secondary | ICD-10-CM | POA: Diagnosis not present

## 2020-01-31 DIAGNOSIS — W19XXXA Unspecified fall, initial encounter: Secondary | ICD-10-CM | POA: Diagnosis present

## 2020-01-31 DIAGNOSIS — N138 Other obstructive and reflux uropathy: Secondary | ICD-10-CM | POA: Diagnosis present

## 2020-01-31 DIAGNOSIS — E871 Hypo-osmolality and hyponatremia: Secondary | ICD-10-CM | POA: Diagnosis not present

## 2020-01-31 DIAGNOSIS — T148XXA Other injury of unspecified body region, initial encounter: Secondary | ICD-10-CM

## 2020-01-31 DIAGNOSIS — R05 Cough: Secondary | ICD-10-CM

## 2020-01-31 DIAGNOSIS — F419 Anxiety disorder, unspecified: Secondary | ICD-10-CM | POA: Diagnosis present

## 2020-01-31 DIAGNOSIS — G9341 Metabolic encephalopathy: Secondary | ICD-10-CM | POA: Diagnosis not present

## 2020-01-31 DIAGNOSIS — Z9842 Cataract extraction status, left eye: Secondary | ICD-10-CM

## 2020-01-31 DIAGNOSIS — Z961 Presence of intraocular lens: Secondary | ICD-10-CM | POA: Diagnosis present

## 2020-01-31 DIAGNOSIS — Z96642 Presence of left artificial hip joint: Secondary | ICD-10-CM | POA: Diagnosis present

## 2020-01-31 DIAGNOSIS — F32A Depression, unspecified: Secondary | ICD-10-CM | POA: Diagnosis present

## 2020-01-31 DIAGNOSIS — R296 Repeated falls: Secondary | ICD-10-CM | POA: Diagnosis present

## 2020-01-31 DIAGNOSIS — J690 Pneumonitis due to inhalation of food and vomit: Secondary | ICD-10-CM | POA: Diagnosis not present

## 2020-01-31 DIAGNOSIS — Z9181 History of falling: Secondary | ICD-10-CM

## 2020-01-31 DIAGNOSIS — L03119 Cellulitis of unspecified part of limb: Secondary | ICD-10-CM

## 2020-01-31 DIAGNOSIS — L03116 Cellulitis of left lower limb: Secondary | ICD-10-CM | POA: Diagnosis not present

## 2020-01-31 DIAGNOSIS — Z20822 Contact with and (suspected) exposure to covid-19: Secondary | ICD-10-CM | POA: Diagnosis present

## 2020-01-31 DIAGNOSIS — Z7401 Bed confinement status: Secondary | ICD-10-CM | POA: Diagnosis not present

## 2020-01-31 DIAGNOSIS — R059 Cough, unspecified: Secondary | ICD-10-CM

## 2020-01-31 DIAGNOSIS — Z7951 Long term (current) use of inhaled steroids: Secondary | ICD-10-CM | POA: Diagnosis not present

## 2020-01-31 DIAGNOSIS — I10 Essential (primary) hypertension: Secondary | ICD-10-CM | POA: Diagnosis present

## 2020-01-31 DIAGNOSIS — Z825 Family history of asthma and other chronic lower respiratory diseases: Secondary | ICD-10-CM | POA: Diagnosis not present

## 2020-01-31 DIAGNOSIS — Z79899 Other long term (current) drug therapy: Secondary | ICD-10-CM | POA: Diagnosis not present

## 2020-01-31 DIAGNOSIS — R0600 Dyspnea, unspecified: Secondary | ICD-10-CM

## 2020-01-31 DIAGNOSIS — S0093XA Contusion of unspecified part of head, initial encounter: Secondary | ICD-10-CM | POA: Diagnosis present

## 2020-01-31 DIAGNOSIS — S2242XD Multiple fractures of ribs, left side, subsequent encounter for fracture with routine healing: Secondary | ICD-10-CM | POA: Diagnosis not present

## 2020-01-31 DIAGNOSIS — S7002XA Contusion of left hip, initial encounter: Secondary | ICD-10-CM | POA: Diagnosis not present

## 2020-01-31 DIAGNOSIS — G20 Parkinson's disease: Secondary | ICD-10-CM | POA: Diagnosis present

## 2020-01-31 DIAGNOSIS — F329 Major depressive disorder, single episode, unspecified: Secondary | ICD-10-CM | POA: Diagnosis not present

## 2020-01-31 DIAGNOSIS — K219 Gastro-esophageal reflux disease without esophagitis: Secondary | ICD-10-CM | POA: Diagnosis not present

## 2020-01-31 DIAGNOSIS — G9389 Other specified disorders of brain: Secondary | ICD-10-CM | POA: Diagnosis not present

## 2020-01-31 DIAGNOSIS — J45901 Unspecified asthma with (acute) exacerbation: Secondary | ICD-10-CM

## 2020-01-31 DIAGNOSIS — J189 Pneumonia, unspecified organism: Secondary | ICD-10-CM | POA: Diagnosis not present

## 2020-01-31 DIAGNOSIS — R0902 Hypoxemia: Secondary | ICD-10-CM | POA: Diagnosis not present

## 2020-01-31 DIAGNOSIS — S51012A Laceration without foreign body of left elbow, initial encounter: Secondary | ICD-10-CM | POA: Diagnosis present

## 2020-01-31 DIAGNOSIS — R0602 Shortness of breath: Secondary | ICD-10-CM | POA: Diagnosis not present

## 2020-01-31 DIAGNOSIS — S59902A Unspecified injury of left elbow, initial encounter: Secondary | ICD-10-CM | POA: Diagnosis not present

## 2020-01-31 DIAGNOSIS — N401 Enlarged prostate with lower urinary tract symptoms: Secondary | ICD-10-CM | POA: Diagnosis not present

## 2020-01-31 DIAGNOSIS — S2249XA Multiple fractures of ribs, unspecified side, initial encounter for closed fracture: Secondary | ICD-10-CM | POA: Diagnosis present

## 2020-01-31 DIAGNOSIS — G20B2 Parkinson's disease with dyskinesia, with fluctuations: Secondary | ICD-10-CM | POA: Diagnosis present

## 2020-01-31 DIAGNOSIS — R404 Transient alteration of awareness: Secondary | ICD-10-CM | POA: Diagnosis not present

## 2020-01-31 DIAGNOSIS — J45909 Unspecified asthma, uncomplicated: Secondary | ICD-10-CM | POA: Diagnosis not present

## 2020-01-31 DIAGNOSIS — R600 Localized edema: Secondary | ICD-10-CM | POA: Diagnosis not present

## 2020-01-31 DIAGNOSIS — M2550 Pain in unspecified joint: Secondary | ICD-10-CM | POA: Diagnosis not present

## 2020-01-31 DIAGNOSIS — R069 Unspecified abnormalities of breathing: Secondary | ICD-10-CM | POA: Diagnosis not present

## 2020-01-31 DIAGNOSIS — S2242XA Multiple fractures of ribs, left side, initial encounter for closed fracture: Secondary | ICD-10-CM | POA: Diagnosis not present

## 2020-01-31 DIAGNOSIS — M25552 Pain in left hip: Secondary | ICD-10-CM

## 2020-01-31 DIAGNOSIS — Z9841 Cataract extraction status, right eye: Secondary | ICD-10-CM | POA: Diagnosis not present

## 2020-01-31 LAB — CBC WITH DIFFERENTIAL/PLATELET
Abs Immature Granulocytes: 0.06 10*3/uL (ref 0.00–0.07)
Basophils Absolute: 0 10*3/uL (ref 0.0–0.1)
Basophils Relative: 0 %
Eosinophils Absolute: 0.1 10*3/uL (ref 0.0–0.5)
Eosinophils Relative: 1 %
HCT: 31.4 % — ABNORMAL LOW (ref 39.0–52.0)
Hemoglobin: 11.7 g/dL — ABNORMAL LOW (ref 13.0–17.0)
Immature Granulocytes: 1 %
Lymphocytes Relative: 17 %
Lymphs Abs: 1.5 10*3/uL (ref 0.7–4.0)
MCH: 31.5 pg (ref 26.0–34.0)
MCHC: 37.3 g/dL — ABNORMAL HIGH (ref 30.0–36.0)
MCV: 84.6 fL (ref 80.0–100.0)
Monocytes Absolute: 0.8 10*3/uL (ref 0.1–1.0)
Monocytes Relative: 9 %
Neutro Abs: 6.6 10*3/uL (ref 1.7–7.7)
Neutrophils Relative %: 72 %
Platelets: 352 10*3/uL (ref 150–400)
RBC: 3.71 MIL/uL — ABNORMAL LOW (ref 4.22–5.81)
RDW: 13.6 % (ref 11.5–15.5)
WBC: 9 10*3/uL (ref 4.0–10.5)
nRBC: 0 % (ref 0.0–0.2)

## 2020-01-31 LAB — COMPREHENSIVE METABOLIC PANEL
ALT: 20 U/L (ref 0–44)
AST: 23 U/L (ref 15–41)
Albumin: 2.9 g/dL — ABNORMAL LOW (ref 3.5–5.0)
Alkaline Phosphatase: 123 U/L (ref 38–126)
Anion gap: 11 (ref 5–15)
BUN: 12 mg/dL (ref 8–23)
CO2: 21 mmol/L — ABNORMAL LOW (ref 22–32)
Calcium: 7.8 mg/dL — ABNORMAL LOW (ref 8.9–10.3)
Chloride: 88 mmol/L — ABNORMAL LOW (ref 98–111)
Creatinine, Ser: 0.43 mg/dL — ABNORMAL LOW (ref 0.61–1.24)
GFR calc Af Amer: >60 mL/min (ref >60)
GFR calc non Af Amer: >60 mL/min (ref >60)
Glucose, Bld: 106 mg/dL — ABNORMAL HIGH (ref 70–99)
Potassium: 3.8 mmol/L (ref 3.5–5.1)
Sodium: 120 mmol/L — ABNORMAL LOW (ref 135–145)
Total Bilirubin: 1.1 mg/dL (ref 0.3–1.2)
Total Protein: 5.6 g/dL — ABNORMAL LOW (ref 6.5–8.1)

## 2020-01-31 LAB — URINALYSIS, COMPLETE (UACMP) WITH MICROSCOPIC
Bacteria, UA: NONE SEEN
Bilirubin Urine: NEGATIVE
Glucose, UA: NEGATIVE mg/dL
Hgb urine dipstick: NEGATIVE
Ketones, ur: 20 mg/dL — AB
Leukocytes,Ua: NEGATIVE
Nitrite: NEGATIVE
Protein, ur: NEGATIVE mg/dL
Specific Gravity, Urine: 1.015 (ref 1.005–1.030)
Squamous Epithelial / HPF: NONE SEEN (ref 0–5)
pH: 6 (ref 5.0–8.0)

## 2020-01-31 LAB — URINALYSIS, ROUTINE W REFLEX MICROSCOPIC
Bilirubin Urine: NEGATIVE
Glucose, UA: NEGATIVE mg/dL
Hgb urine dipstick: NEGATIVE
Ketones, ur: 5 mg/dL — AB
Leukocytes,Ua: NEGATIVE
Nitrite: NEGATIVE
Protein, ur: NEGATIVE mg/dL
Specific Gravity, Urine: 1.008 (ref 1.005–1.030)
pH: 7 (ref 5.0–8.0)

## 2020-01-31 LAB — SEDIMENTATION RATE: Sed Rate: 12 mm/hr (ref 0–20)

## 2020-01-31 LAB — TROPONIN I (HIGH SENSITIVITY): Troponin I (High Sensitivity): 7 ng/L (ref <18)

## 2020-01-31 LAB — LACTIC ACID, PLASMA: Lactic Acid, Venous: 1.1 mmol/L (ref 0.5–1.9)

## 2020-01-31 LAB — SARS CORONAVIRUS 2 BY RT PCR (HOSPITAL ORDER, PERFORMED IN ~~LOC~~ HOSPITAL LAB): SARS Coronavirus 2: NEGATIVE

## 2020-01-31 LAB — CK: Total CK: 95 U/L (ref 49–397)

## 2020-01-31 MED ORDER — ALBUTEROL SULFATE (2.5 MG/3ML) 0.083% IN NEBU: 2.5000 mg | INHALATION_SOLUTION | RESPIRATORY_TRACT | Status: DC | PRN

## 2020-01-31 MED ORDER — SODIUM CHLORIDE 0.9 % IV BOLUS
250.0000 mL | Freq: Once | INTRAVENOUS | Status: AC
  Administered 2020-01-31: 250 mL via INTRAVENOUS

## 2020-01-31 MED ORDER — SODIUM CHLORIDE 0.9 % IV SOLN
1.0000 g | Freq: Once | INTRAVENOUS | Status: AC
  Administered 2020-01-31: 1 g via INTRAVENOUS
  Filled 2020-01-31: qty 10

## 2020-01-31 MED ORDER — HYDRALAZINE HCL 20 MG/ML IJ SOLN: 5.0000 mg | INTRAMUSCULAR | Status: DC | PRN

## 2020-01-31 MED ORDER — ONDANSETRON HCL 4 MG/2ML IJ SOLN: 4.0000 mg | Freq: Three times a day (TID) | INTRAMUSCULAR | Status: DC | PRN

## 2020-01-31 MED ORDER — SODIUM CHLORIDE 0.9 % IV SOLN: Freq: Once | INTRAVENOUS | Status: DC

## 2020-01-31 MED ORDER — METRONIDAZOLE IN NACL 5-0.79 MG/ML-% IV SOLN
500.0000 mg | Freq: Three times a day (TID) | INTRAVENOUS | Status: DC
  Administered 2020-01-31 – 2020-02-01 (×2): 500 mg via INTRAVENOUS
  Filled 2020-01-31 (×2): qty 100

## 2020-01-31 MED ORDER — SODIUM CHLORIDE 0.9 % IV SOLN: Freq: Once | INTRAVENOUS | Status: AC

## 2020-01-31 MED ORDER — SODIUM CHLORIDE 0.9 % IV BOLUS: 500.0000 mL | Freq: Once | INTRAVENOUS | Status: DC

## 2020-01-31 MED ORDER — DM-GUAIFENESIN ER 30-600 MG PO TB12: 1.0000 | ORAL_TABLET | Freq: Two times a day (BID) | ORAL | Status: DC | PRN

## 2020-01-31 MED ORDER — SODIUM CHLORIDE 0.9 % IV SOLN
500.0000 mg | Freq: Once | INTRAVENOUS | Status: AC
  Administered 2020-01-31: 500 mg via INTRAVENOUS
  Filled 2020-01-31: qty 500

## 2020-01-31 MED ORDER — IPRATROPIUM-ALBUTEROL 0.5-2.5 (3) MG/3ML IN SOLN
3.0000 mL | RESPIRATORY_TRACT | Status: DC
  Administered 2020-01-31 – 2020-02-03 (×11): 3 mL via RESPIRATORY_TRACT
  Filled 2020-01-31 (×13): qty 3

## 2020-01-31 MED ORDER — OXYCODONE-ACETAMINOPHEN 5-325 MG PO TABS
1.0000 | ORAL_TABLET | ORAL | Status: DC | PRN
  Administered 2020-02-01: 1 via ORAL
  Filled 2020-01-31: qty 1

## 2020-01-31 MED ORDER — SODIUM CHLORIDE 0.9 % IV SOLN: 1.0000 g | INTRAVENOUS | Status: DC

## 2020-01-31 NOTE — ED Notes (Signed)
Called and spoke with Gwyn in the lab. Per Garfield Cornea. Pt has not been drawn yet. They will send someone to draw blood.

## 2020-01-31 NOTE — ED Provider Notes (Signed)
[facility] Emergency Department Provider Note   ____________________________________________   First MD Initiated Contact with Patient 01/31/20 1332     (approximate)  I have reviewed the triage vital signs and the nursing notes.   HISTORY  Chief Complaint Cough and Shortness of Breath   HPI Brandon Calhoun is a 86 y.o. male who comes from assisted living.  He reports frequent falling but does not remember when.  He has rattling respirations.  He is not running a fever.  O2 sats are okay.  Chest x-ray showed infiltrate with a number of rib fractures on the left.  Patient reports his elbows are full of skin tears from falling.  He has bruising there as well and bruising on his left hip.  He has bruising on his head as well all these areas are tender.         Past Medical History:  Diagnosis Date  . Asthma   . Edema    MILD OF FEET  . GERD (gastroesophageal reflux disease)   . Hypertension   . Mood disorder (HCC)    chronic anxiety    Patient Active Problem List   Diagnosis Date Noted  . HTN (hypertension) 01/31/2020  . Depression 01/31/2020  . Fall 01/31/2020  . Acute metabolic encephalopathy 01/31/2020  . Multiple rib fractures 01/31/2020  . Hyponatremia 10/29/2019  . Leukocytosis 10/29/2019  . Accidental fall 10/29/2019  . Multiple fractures of ribs, right side, initial encounter for closed fracture 10/29/2019  . Generalized weakness 10/29/2019  . Venous insufficiency of both lower extremities 07/03/2018  . Parkinson's disease (HCC) 01/03/2017  . 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 GERD (gastroesophageal reflux disease)   . Asthma exacerbation   . Mood disorder [facility])     Past Surgical History:  Procedure Laterality Date  . CATARACT EXTRACTION W/PHACO Left 02/16/2016   Procedure: CATARACT EXTRACTION PHACO AND INTRAOCULAR LENS PLACEMENT (IOC);  Surgeon: Nevada Crane, MD;  Location: [facility] ORS;   Service: Ophthalmology;  Laterality: Left;  [place] 1.10 AP% 10.6 CDE 7.41 Fluid Pack Lot # [id number]  . CATARACT EXTRACTION W/PHACO Right 03/08/2016   Procedure: CATARACT EXTRACTION PHACO AND INTRAOCULAR LENS PLACEMENT (IOC);  Surgeon: Nevada Crane, MD;  Location: [facility] ORS;  Service: Ophthalmology;  Laterality: Right;  [place] 1.14 AP% 11.6 CDE 11.80 Fluid Pack Lot # [id number]  . HERNIA REPAIR    . HIP ARTHROPLASTY Left 07/06/2016   Procedure: ARTHROPLASTY BIPOLAR HIP (HEMIARTHROPLASTY);  Surgeon: Kennedy Bucker, MD;  Location: [facility] ORS;  Service: Orthopedics;  Laterality: Left;    Prior to Admission medications   Medication Sig Start Date End Date Taking? Authorizing Provider  amoxicillin-clavulanate (AUGMENTIN) 875-125 MG tablet Take 1 tablet by mouth 2 (two) times daily. 01/29/20  Yes [provider]  azithromycin (ZITHROMAX) 250 MG tablet Take 250 mg by mouth as directed. 01/28/20  Yes [provider]  budesonide-formoterol (SYMBICORT) 160-4.5 MCG/ACT inhaler Inhale 2 puffs into the lungs 2 (two) times daily.   Yes [provider]  fluticasone (FLONASE) 50 MCG/ACT nasal spray Place 2 sprays into the nose daily.   Yes [provider]  montelukast (SINGULAIR) 10 MG tablet Take 10 mg by mouth at bedtime.   Yes [provider]  omeprazole (PRILOSEC) 20 MG capsule Take 1 capsule by mouth daily.   Yes [provider]  spironolactone (ALDACTONE) 25 MG tablet Take 25 mg by mouth daily.  08/17/18  Yes [provider]  tamsulosin (  FLOMAX) 0.4 MG CAPS capsule Take 0.4 mg by mouth daily.    Yes [provider]  acetaminophen (TYLENOL) 325 MG tablet Take 650 mg by mouth every 6 (six) hours as needed. Patient not taking: Reported on 01/25/2020    [provider]  albuterol (PROVENTIL HFA;VENTOLIN HFA) 108 (90 Base) MCG/ACT inhaler Inhale 2 puffs into the lungs 3 (three) times daily as needed for wheezing or shortness of breath. Patient  not taking: Reported on 01/25/2020    [provider]  alum & mag hydroxide-simeth (MAALOX/MYLANTA) 200-200-20 MG/5ML suspension Take 30 mLs by mouth every 6 (six) hours as needed for indigestion or heartburn. Patient not taking: Reported on 01/25/2020    [provider]  carbamide peroxide (DEBROX) 6.5 % OTIC solution Place 5 drops into both ears as needed. Patient not taking: Reported on 01/25/2020    [provider]  carbidopa-levodopa (SINEMET IR) 25-100 MG tablet Take 1 tablet by mouth 3 (three) times daily. Patient not taking: Reported on 01/31/2020 07/18/17   Karie SchwalbeLetvak, Richard I, MD  cetirizine (ZYRTEC) 10 MG tablet Take 10 mg by mouth daily as needed for allergies.    [provider]  citalopram (CELEXA) 10 MG tablet Take 1 tablet (10 mg total) by mouth daily. Patient not taking: Reported on 01/31/2020 05/06/19   Lorre MunroeBaity, Regina W, NP  Glucose 15 GM/32ML GEL as needed. Give 1 packet by mouth as needed for low blood sugar Patient not taking: Reported on 01/25/2020    [provider]  magnesium hydroxide (MILK OF MAGNESIA) 400 MG/5ML suspension Take 30 mLs by mouth daily as needed for mild constipation. Patient not taking: Reported on 01/25/2020    [provider]    Allergies Patient has no known allergies.  Family History  Problem Relation Age of Onset  . COPD Brother   . Hypertension Neg Hx   . Diabetes Neg Hx   . Heart disease Neg Hx     Social History Social History   Tobacco Use  . Smoking status: Never Smoker  . Smokeless tobacco: Never Used  Substance Use Topics  . Alcohol use: No  . Drug use: No    Review of Systems  Constitutional: No fever/chills Eyes: No visual changes. ENT: No sore throat. Cardiovascular: chest pain. Respiratory: Denies shortness of breath. Gastrointestinal: No abdominal pain.  No nausea, no vomiting.  No diarrhea.  No constipation. Genitourinary: Negative for dysuria. Musculoskeletal: Negative for  back pain. Skin: Negative for rash. Neurological: Negative for headaches, focal weakness   ____________________________________________   PHYSICAL EXAM:  VITAL SIGNS: ED Triage Vitals  Enc Vitals Group     BP 01/31/20 0401 (!) 146/63     Pulse Rate 01/31/20 0401 73     Resp 01/31/20 0401 19     Temp 01/31/20 0401 98.1 F (36.7 C)     Temp Source 01/31/20 0401 Oral     SpO2 01/31/20 0401 94 %     Weight 01/31/20 0402 145 lb (65.8 kg)     Height 01/31/20 0402 5\' 9"  (1.753 m)     Head Circumference --      Peak Flow --      Pain Score 01/31/20 0402 0     Pain Loc --      Pain Edu? --      Excl. in GC? --     Constitutional: Awake alert oriented to person and place.  Complaining of multiple areas of pain Eyes: Conjunctivae are normal. PER  Head: Atraumatic. Nose: No congestion/rhinnorhea. Mouth/Throat: Mucous membranes are moist.  Oropharynx non-erythematous. Neck: No stridor Cardiovascular: Normal rate, regular rhythm. Grossly normal heart sounds.  Good peripheral circulation. Respiratory: Normal respiratory effort.  No retractions. Lungs CTAB. Gastrointestinal: Soft and nontender. No distention. No abdominal bruits.  Musculoskeletal: No lower extremity tenderness bilateral edema.  Left shin is reddened possible cellulitis.  Is warm as well Neurologic: Slow speech and language. No gross focal neurologic deficits are appreciated.  Moves all extremities equally.  Limited movement of the left elbow due to pain Skin:  Skin is warm, dry multiple bruises and skin tears no rash noted.   ____________________________________________   LABS (all labs ordered are listed, but only abnormal results are displayed)  Labs Reviewed  CBC WITH DIFFERENTIAL/PLATELET - Abnormal; Notable for the following components:      Result Value   RBC 3.71 (*)    Hemoglobin 11.7 (*)    HCT 31.4 (*)    MCHC 37.3 (*)    All other components within normal limits  COMPREHENSIVE METABOLIC PANEL -  Abnormal; Notable for the following components:   Sodium 120 (*)    Chloride 88 (*)    CO2 21 (*)    Glucose, Bld 106 (*)    Creatinine, Ser 0.43 (*)    Calcium 7.8 (*)    Total Protein 5.6 (*)    Albumin 2.9 (*)    All other components within normal limits  URINALYSIS, ROUTINE W REFLEX MICROSCOPIC - Abnormal; Notable for the following components:   Color, Urine STRAW (*)    APPearance CLEAR (*)    Ketones, ur 5 (*)    All other components within normal limits  SARS CORONAVIRUS 2 BY RT PCR (HOSPITAL ORDER, PERFORMED IN [facility])  CULTURE, BLOOD (ROUTINE X 2)  CULTURE, BLOOD (ROUTINE X 2)  LACTIC ACID, PLASMA  CK  LACTIC ACID, PLASMA  URINALYSIS, COMPLETE (UACMP) WITH MICROSCOPIC  TROPONIN I (HIGH SENSITIVITY)   ____________________________________________  EKG  EKG read interpreted by me shows normal sinus rhythm rate of 72 left axis right bundle branch block no acute ST-T wave changes ____________________________________________  RADIOLOGY  ED MD interpretation: Chest x-ray read by radiology I cannot access the film I tried several times.  Radiology reads fractures of 6 ribs on the left small left-sided pleural effusion increasing vascular congestion in the left lung and possible left-sided infiltrate.  Official radiology report(s): DG Elbow Complete Left  Result Date: 01/31/2020 CLINICAL DATA:  Fall EXAM: LEFT ELBOW - COMPLETE 3+ VIEW COMPARISON:  None. FINDINGS: No acute fracture or dislocation.  No joint effusion. IMPRESSION: No acute osseous abnormality. Electronically Signed   By: Jeronimo Greaves M.D.   On: 01/31/2020 14:35   DG Elbow Complete Right  Result Date: 01/31/2020 CLINICAL DATA:  Status post fall. EXAM: RIGHT ELBOW - COMPLETE 3+ VIEW COMPARISON:  None. FINDINGS: There is no evidence of fracture, dislocation, or joint effusion. There is no evidence of arthropathy or other focal bone abnormality. Soft tissues are unremarkable. IMPRESSION: No acute  osseous abnormality. Electronically Signed   By: Annia Belt M.D.   On: 01/31/2020 14:46   CT Head Wo Contrast  Result Date: 01/31/2020 CLINICAL DATA:  Shortness of breath.  Cough. EXAM: CT HEAD WITHOUT CONTRAST CT CERVICAL SPINE WITHOUT CONTRAST TECHNIQUE: Multidetector CT imaging of the head and cervical spine was performed following the standard protocol without intravenous contrast. Multiplanar CT image reconstructions of the cervical spine were also generated. COMPARISON:  None. FINDINGS: CT HEAD FINDINGS Brain: Ventricles and sulci are prominent compatible with atrophy. Periventricular and subcortical white matter hypodensities compatible with chronic microvascular ischemic changes. No evidence for acute cortically based infarct, intracranial hemorrhage, mass lesion or mass-effect. 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 Vascular: Unremarkable Skull: Intact. Sinuses/Orbits: Paranasal sinuses well aerated. Mastoid air cells unremarkable. Orbits unremarkable. Other: None. CT CERVICAL SPINE FINDINGS Alignment: Normal. Skull base and vertebrae: No acute fracture. No primary bone lesion or focal pathologic process. Soft tissues and spinal canal: No prevertebral fluid or swelling. No visible canal hematoma. Disc levels: No acute fracture. C5-6 and C6-7 degenerative disc disease. Multilevel facet degenerative changes. Upper chest: Layering left effusion. Other: None. IMPRESSION: 1. No acute intracranial process. Atrophy and chronic microvascular ischemic changes. 2. No acute cervical spine fracture. Degenerative disc disease. 3. Layering left effusion. Electronically Signed   By: Annia Belt M.D.   On: 01/31/2020 14:43   CT Cervical Spine Wo Contrast  Result Date: 01/31/2020 CLINICAL DATA:  Shortness of breath.  Cough. EXAM: CT HEAD WITHOUT CONTRAST CT CERVICAL SPINE WITHOUT CONTRAST TECHNIQUE: Multidetector CT imaging of the head and cervical spine was performed following the standard protocol without intravenous  contrast. Multiplanar CT image reconstructions of the cervical spine were also generated. COMPARISON:  None. FINDINGS: CT HEAD FINDINGS Brain: Ventricles and sulci are prominent compatible with atrophy. Periventricular and subcortical white matter hypodensities compatible with chronic microvascular ischemic changes. No evidence for acute cortically based infarct, intracranial hemorrhage, mass lesion or mass-effect. 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 Vascular: Unremarkable Skull: Intact. Sinuses/Orbits: Paranasal sinuses well aerated. Mastoid air cells unremarkable. Orbits unremarkable. Other: None. CT CERVICAL SPINE FINDINGS Alignment: Normal. Skull base and vertebrae: No acute fracture. No primary bone lesion or focal pathologic process. Soft tissues and spinal canal: No prevertebral fluid or swelling. No visible canal hematoma. Disc levels: No acute fracture. C5-6 and C6-7 degenerative disc disease. Multilevel facet degenerative changes. Upper chest: Layering left effusion. Other: None. IMPRESSION: 1. No acute intracranial process. Atrophy and chronic microvascular ischemic changes. 2. No acute cervical spine fracture. Degenerative disc disease. 3. Layering left effusion. Electronically Signed   By: Annia Belt M.D.   On: 01/31/2020 14:43   DG Chest Port 1 View  Result Date: 01/31/2020 CLINICAL DATA:  Initial evaluation for acute cough. EXAM: PORTABLE CHEST 1 VIEW COMPARISON:  Prior radiograph from 10/29/2019. FINDINGS: Cardiomegaly, stable. Mediastinal silhouette within normal limits. Aortic atherosclerosis. Lungs normally inflated. Patchy and hazy opacity seen throughout the mid and lower left lung, suspicious for possible infiltrate given provided history of cough. Increased vascular congestion seen within the left lung. Associated small layering left pleural effusion. Right lung is clear. No pneumothorax. There are acute appearing fractures of the left posterolateral fourth, fifth, sixth, seventh, eighth, and  ninth ribs. These are new as compared to prior radiograph. IMPRESSION: 1. Acute appearing fractures of the left posterolateral fourth through ninth ribs, new as compared to prior radiograph from 10/29/2019. Correlation with history and physical exam recommended. 2. Patchy and hazy opacity throughout the mid and lower left lung, suspicious for infiltrate given the provided history of cough. 3. Associated small layering left pleural effusion. Electronically Signed   By: Rise Mu M.D.   On: 01/31/2020 04:29   DG HIP UNILAT WITH PELVIS 2-3 VIEWS LEFT  Result Date: 01/31/2020 CLINICAL DATA:  Fall, pain. EXAM: DG HIP (WITH OR WITHOUT PELVIS) 2-3V LEFT COMPARISON:  07/06/2016 left hip radiographs. FINDINGS: Sequela of left hip arthroplasty. Hardware components are well seated and  articulate appropriately. No concerning perihardware lucency. Left hip heterotopic ossification. No soft tissue abnormalities. Limited visualization of the right hip demonstrates no acute abnormalities and mild osteoarthrosis. Sequela of left pelvic hernia repair.  Lumbar spondylosis. IMPRESSION: No acute osseous abnormality. Sequela of left hip arthroplasty without adverse features. Mild right hip osteoarthrosis. Electronically Signed   By: Stana Bunting M.D.   On: 01/31/2020 14:37    ____________________________________________   PROCEDURES  Procedure(s) performed (including Critical Care):  Procedures   ____________________________________________   INITIAL IMPRESSION / ASSESSMENT AND PLAN / ED COURSE  Patient apparently with multiple falls possibly due to his Parkinson's disease.  He had multiple skin tears which have been repaired.  He also had 9 rib fractures.  Chest x-ray may have also shown pneumonia versus lung contusion.  He will need treatment for his hyponatremia evaluation for his frequent falls treatment for his rib fractures bruising etc.          Clinical Course as of Jan 30 1726  Sun  Jan 31, 2020  1433 DG Chest [place] 1 View [PM]    Clinical Course User Index [PM] Arnaldo Natal, MD     ____________________________________________   FINAL CLINICAL IMPRESSION(S) / ED DIAGNOSES  Final diagnoses:  Cough  Dyspnea, unspecified type  Closed fracture of multiple ribs of left side, initial encounter  Bruising  Multiple skin tears  Hyponatremia     ED Discharge Orders    None       Note:  This document was prepared using Dragon voice recognition software and may include unintentional dictation errors.    Arnaldo Natal, MD 01/31/20 1728

## 2020-01-31 NOTE — H&P (Addendum)
History and Physical    ZAYLEN SUSMAN SWN:[id number] DOB: 1929/09/28 DOA: 01/31/2020  Referring MD/NP/PA:   PCP: Venia Carbon, MD   Patient coming from:  The patient is coming from ALF  At baseline, pt is partially dependent for most of ADL.        Chief Complaint: fall, cough and SOB  HPI: Brandon Calhoun is a 86 y.o. male with medical history significant of hypertension, asthma, GERD, depression, anxiety, BPH, Parkinson's disease, who presents with fall, cough, shortness breath.  Patient has cough and shortness of breath in past several days, which has been progressively worsening.  Patient has congested cough with mucus production.  Denies chest pain, fever or chills.  Patient does not have nausea vomiting, diarrhea or abdominal pain.  No symptoms of UTI.  No unilateral numbness or tingling his activities.  No facial droop or slurred speech.  Patient has left lower leg cellulitis, heart rate still taking Augmentin. Patient had multiple falling recently. Patient has small skin tear in her elbows, with bruise in left hip and head.  Patient is alert oriented x3.  Per her daughter, patient mental status is at his normal baseline.  ED Course: pt was found to have WBC 9.0, negative urinalysis, pending CK level, pending COVID-19 PCR, sodium 120, renal function okay, troponin level 7, temperature normal, blood pressure 146/63, heart rate 65, RR 16, oxygen saturation 94% on room air.  CT head is negative for acute intracranial abnormalities.  CT of C-spine is negative for acute bony fracture, but did show degenerative disc disease.  Negative x-ray for left elbow, right elbow and left hip. Patient is admitted to MedSurg bed as inpatient  CXR showed: 1. Acute appearing fractures of the left posterolateral fourth through ninth ribs, new as compared to prior radiograph from 10/29/2019. Correlation with history and physical exam recommended.  2. Patchy and hazy opacity throughout the mid and  lower left lung, suspicious for infiltrate given the provided history of cough.  3. Associated small layering left pleural effusion.  Review of Systems:   General: no fevers, chills, no body weight gain, has poor appetite, has fatigue HEENT: no blurry vision, hearing changes or sore throat Respiratory: has dyspnea, coughing, no wheezing CV: no chest pain, no palpitations GI: no nausea, vomiting, abdominal pain, diarrhea, constipation GU: no dysuria, burning on urination, increased urinary frequency, hematuria  Ext: has left lower leg edema, erythema and tenderness Neuro: no unilateral weakness, numbness, or tingling, no vision change or hearing loss. Has fall. Skin: no rash, no skin tear. MSK: No muscle spasm, no deformity, no limitation of range of movement in spin Heme: No easy bruising.  Travel history: No recent long distant travel.  Allergy: No Known Allergies  Past Medical History:  Diagnosis Date  . Asthma   . Edema    MILD OF FEET  . GERD (gastroesophageal reflux disease)   . Hypertension   . Mood disorder ([place])    chronic anxiety    Past Surgical History:  Procedure Laterality Date  . CATARACT EXTRACTION W/PHACO Left 02/16/2016   Procedure: CATARACT EXTRACTION PHACO AND INTRAOCULAR LENS PLACEMENT (IOC);  Surgeon: Eulogio Bear, MD;  Location: [facility] ORS;  Service: Ophthalmology;  Laterality: Left;  [place] 1.10 AP% 10.6 CDE 7.41 Fluid Pack Lot # [id number]  . CATARACT EXTRACTION W/PHACO Right 03/08/2016   Procedure: CATARACT EXTRACTION PHACO AND INTRAOCULAR LENS PLACEMENT (IOC);  Surgeon: Eulogio Bear, MD;  Location: [facility] OR[facility];  Service: Ophthalmology;  Laterality:  Right;  [place] 1.14 AP% 11.6 CDE 11.80 Fluid Pack Lot # [id number]  . HERNIA REPAIR    . HIP ARTHROPLASTY Left 07/06/2016   Procedure: ARTHROPLASTY BIPOLAR HIP (HEMIARTHROPLASTY);  Surgeon: Hessie Knows, MD;  Location: [facility] ORS;  Service: Orthopedics;  Laterality: Left;    Social History:  reports that he  has never smoked. He has never used smokeless tobacco. He reports that he does not drink alcohol and does not use drugs.  Family History:  Family History  Problem Relation Age of Onset  . COPD Brother   . Hypertension Neg Hx   . Diabetes Neg Hx   . Heart disease Neg Hx      Prior to Admission medications   Medication Sig Start Date End Date Taking? Authorizing Provider  amoxicillin-clavulanate (AUGMENTIN) 875-125 MG tablet Take 1 tablet by mouth 2 (two) times daily. 01/29/20  Yes [provider]  azithromycin (ZITHROMAX) 250 MG tablet Take 250 mg by mouth as directed. 01/28/20  Yes [provider]  budesonide-formoterol (SYMBICORT) 160-4.5 MCG/ACT inhaler Inhale 2 puffs into the lungs 2 (two) times daily.   Yes [provider]  fluticasone (FLONASE) 50 MCG/ACT nasal spray Place 2 sprays into the nose daily.   Yes [provider]  montelukast (SINGULAIR) 10 MG tablet Take 10 mg by mouth at bedtime.   Yes [provider]  omeprazole (PRILOSEC) 20 MG capsule Take 1 capsule by mouth daily.   Yes [provider]  spironolactone (ALDACTONE) 25 MG tablet Take 25 mg by mouth daily.  08/17/18  Yes [provider]  tamsulosin (FLOMAX) 0.4 MG CAPS capsule Take 0.4 mg by mouth daily.    Yes [provider]  acetaminophen (TYLENOL) 325 MG tablet Take 650 mg by mouth every 6 (six) hours as needed. Patient not taking: Reported on 01/25/2020    [provider]  albuterol (PROVENTIL HFA;VENTOLIN HFA) 108 (90 Base) MCG/ACT inhaler Inhale 2 puffs into the lungs 3 (three) times daily as needed for wheezing or shortness of breath. Patient not taking: Reported on 01/25/2020    [provider]  alum & mag hydroxide-simeth (MAALOX/MYLANTA) 200-200-20 MG/5ML suspension Take 30 mLs by mouth every 6 (six) hours as needed for indigestion or heartburn. Patient not taking: Reported on 01/25/2020    [provider]  carbamide  peroxide (DEBROX) 6.5 % OTIC solution Place 5 drops into both ears as needed. Patient not taking: Reported on 01/25/2020    [provider]  carbidopa-levodopa (SINEMET IR) 25-100 MG tablet Take 1 tablet by mouth 3 (three) times daily. Patient not taking: Reported on 01/31/2020 07/18/17   Venia Carbon, MD  cetirizine (ZYRTEC) 10 MG tablet Take 10 mg by mouth daily as needed for allergies.    [provider]  citalopram (CELEXA) 10 MG tablet Take 1 tablet (10 mg total) by mouth daily. Patient not taking: Reported on 01/31/2020 05/06/19   Jearld Fenton, NP  Glucose 15 GM/32ML GEL as needed. Give 1 packet by mouth as needed for low blood sugar Patient not taking: Reported on 01/25/2020    [provider]  magnesium hydroxide (MILK OF MAGNESIA) 400 MG/5ML suspension Take 30 mLs by mouth daily as needed for mild constipation. Patient not taking: Reported on 01/25/2020    [provider]    Physical Exam: Vitals:   01/31/20 0402 01/31/20 0822 01/31/20 1208 01/31/20 1622  BP:  (!) 151/61 (!) 146/63 (!) 148/63  Pulse:  65 65 67  Resp:  16 16 17   Temp:  97.6 F (36.4 C) 98.1 F (36.7 C)   TempSrc:  Oral Oral   SpO2:  93% 96% 98%  Weight: 65.8 kg     Height: 5' 9"  (1.753 m)      General: Not in acute distress HEENT:       Eyes: PERRL, EOMI, no scleral icterus.       ENT: No discharge from the ears and nose, no pharynx injection, no tonsillar enlargement.        Neck: No JVD, no bruit, no mass felt. Heme: No neck lymph node enlargement. Cardiac: S1/S2, RRR, No murmurs, No gallops or rubs. Respiratory: Has rhonchi on the left side, no wheezing GI: Soft, nondistended, nontender, no rebound pain, no organomegaly, BS present. GU: No hematuria Ext: 1+DP/PT pulse bilaterally.  Has swelling, erythema, warmth and tenderness in the left lower leg Musculoskeletal: No joint deformities, No joint redness or warmth, no limitation of ROM in spin. Skin: No rashes.    Neuro: Alert, oriented X3, cranial nerves II-XII grossly intact, moves all extremities normally. Psych: Patient is not psychotic, no suicidal or hemocidal ideation.  Labs on Admission: I have personally reviewed following labs and imaging studies  CBC: Recent Labs  Lab 01/31/20 0952  WBC 9.0  NEUTROABS 6.6  HGB 11.7*  HCT 31.4*  MCV 84.6  PLT 323   Basic Metabolic Panel: Recent Labs  Lab 01/31/20 0952  NA 120*  K 3.8  CL 88*  CO2 21*  GLUCOSE 106*  BUN 12  CREATININE 0.43*  CALCIUM 7.8*   GFR: Estimated Creatinine Clearance: 59.4 mL/min (A) (by C-G formula based on SCr of 0.43 mg/dL (L)). Liver Function Tests: Recent Labs  Lab 01/31/20 0952  AST 23  ALT 20  ALKPHOS 123  BILITOT 1.1  PROT 5.6*  ALBUMIN 2.9*   No results for input(s): LIPASE, AMYLASE in the last 168 hours. No results for input(s): AMMONIA in the last 168 hours. Coagulation Profile: No results for input(s): INR, PROTIME in the last 168 hours. Cardiac Enzymes: Recent Labs  Lab 01/31/20 1343  CKTOTAL 95   BNP (last 3 results) No results for input(s): PROBNP in the last 8760 hours. HbA1C: No results for input(s): HGBA1C in the last 72 hours. CBG: No results for input(s): GLUCAP in the last 168 hours. Lipid Profile: No results for input(s): CHOL, HDL, LDLCALC, TRIG, CHOLHDL, LDLDIRECT in the last 72 hours. Thyroid Function Tests: No results for input(s): TSH, T4TOTAL, FREET4, T3FREE, THYROIDAB in the last 72 hours. Anemia Panel: No results for input(s): VITAMINB12, FOLATE, FERRITIN, TIBC, IRON, RETICCTPCT in the last 72 hours. Urine analysis:    Component Value Date/Time   COLORURINE STRAW (A) 01/31/2020 0512   APPEARANCEUR CLEAR (A) 01/31/2020 0512   LABSPEC 1.008 01/31/2020 0512   PHURINE 7.0 01/31/2020 0512   GLUCOSEU NEGATIVE 01/31/2020 0512   HGBUR NEGATIVE 01/31/2020 0512   BILIRUBINUR NEGATIVE 01/31/2020 0512   KETONESUR 5 (A) 01/31/2020 0512   PROTEINUR NEGATIVE 01/31/2020  0512   NITRITE NEGATIVE 01/31/2020 0512   LEUKOCYTESUR NEGATIVE 01/31/2020 0512   Sepsis Labs: @LABRCNTIP (procalcitonin:4,lacticidven:4) )No results found for this or any previous visit (from the past 240 hour(s)).   Radiological Exams on Admission: DG Elbow Complete Left  Result Date: 01/31/2020 CLINICAL DATA:  Fall EXAM: LEFT ELBOW - COMPLETE 3+ VIEW COMPARISON:  None. FINDINGS: No acute fracture or dislocation.  No joint effusion. IMPRESSION: No acute osseous abnormality. Electronically Signed   By: Abigail Miyamoto  M.D.   On: 01/31/2020 14:35   DG Elbow Complete Right  Result Date: 01/31/2020 CLINICAL DATA:  Status post fall. EXAM: RIGHT ELBOW - COMPLETE 3+ VIEW COMPARISON:  None. FINDINGS: There is no evidence of fracture, dislocation, or joint effusion. There is no evidence of arthropathy or other focal bone abnormality. Soft tissues are unremarkable. IMPRESSION: No acute osseous abnormality. Electronically Signed   By: Lovey Newcomer M.D.   On: 01/31/2020 14:46   CT Head Wo Contrast  Result Date: 01/31/2020 CLINICAL DATA:  Shortness of breath.  Cough. EXAM: CT HEAD WITHOUT CONTRAST CT CERVICAL SPINE WITHOUT CONTRAST TECHNIQUE: Multidetector CT imaging of the head and cervical spine was performed following the standard protocol without intravenous contrast. Multiplanar CT image reconstructions of the cervical spine were also generated. COMPARISON:  None. FINDINGS: CT HEAD FINDINGS Brain: Ventricles and sulci are prominent compatible with atrophy. Periventricular and subcortical white matter hypodensities compatible with chronic microvascular ischemic changes. No evidence for acute cortically based infarct, intracranial hemorrhage, mass lesion or mass-effect. 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 Vascular: Unremarkable Skull: Intact. Sinuses/Orbits: Paranasal sinuses well aerated. Mastoid air cells unremarkable. Orbits unremarkable. Other: None. CT CERVICAL SPINE FINDINGS Alignment: Normal. Skull base and  vertebrae: No acute fracture. No primary bone lesion or focal pathologic process. Soft tissues and spinal canal: No prevertebral fluid or swelling. No visible canal hematoma. Disc levels: No acute fracture. C5-6 and C6-7 degenerative disc disease. Multilevel facet degenerative changes. Upper chest: Layering left effusion. Other: None. IMPRESSION: 1. No acute intracranial process. Atrophy and chronic microvascular ischemic changes. 2. No acute cervical spine fracture. Degenerative disc disease. 3. Layering left effusion. Electronically Signed   By: Lovey Newcomer M.D.   On: 01/31/2020 14:43   CT Cervical Spine Wo Contrast  Result Date: 01/31/2020 CLINICAL DATA:  Shortness of breath.  Cough. EXAM: CT HEAD WITHOUT CONTRAST CT CERVICAL SPINE WITHOUT CONTRAST TECHNIQUE: Multidetector CT imaging of the head and cervical spine was performed following the standard protocol without intravenous contrast. Multiplanar CT image reconstructions of the cervical spine were also generated. COMPARISON:  None. FINDINGS: CT HEAD FINDINGS Brain: Ventricles and sulci are prominent compatible with atrophy. Periventricular and subcortical white matter hypodensities compatible with chronic microvascular ischemic changes. No evidence for acute cortically based infarct, intracranial hemorrhage, mass lesion or mass-effect. 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 Vascular: Unremarkable Skull: Intact. Sinuses/Orbits: Paranasal sinuses well aerated. Mastoid air cells unremarkable. Orbits unremarkable. Other: None. CT CERVICAL SPINE FINDINGS Alignment: Normal. Skull base and vertebrae: No acute fracture. No primary bone lesion or focal pathologic process. Soft tissues and spinal canal: No prevertebral fluid or swelling. No visible canal hematoma. Disc levels: No acute fracture. C5-6 and C6-7 degenerative disc disease. Multilevel facet degenerative changes. Upper chest: Layering left effusion. Other: None. IMPRESSION: 1. No acute intracranial process. Atrophy  and chronic microvascular ischemic changes. 2. No acute cervical spine fracture. Degenerative disc disease. 3. Layering left effusion. Electronically Signed   By: Lovey Newcomer M.D.   On: 01/31/2020 14:43   DG Chest Port 1 View  Result Date: 01/31/2020 CLINICAL DATA:  Initial evaluation for acute cough. EXAM: PORTABLE CHEST 1 VIEW COMPARISON:  Prior radiograph from 10/29/2019. FINDINGS: Cardiomegaly, stable. Mediastinal silhouette within normal limits. Aortic atherosclerosis. Lungs normally inflated. Patchy and hazy opacity seen throughout the mid and lower left lung, suspicious for possible infiltrate given provided history of cough. Increased vascular congestion seen within the left lung. Associated small layering left pleural effusion. Right lung is clear. No pneumothorax. There are acute appearing  fractures of the left posterolateral fourth, fifth, sixth, seventh, eighth, and ninth ribs. These are new as compared to prior radiograph. IMPRESSION: 1. Acute appearing fractures of the left posterolateral fourth through ninth ribs, new as compared to prior radiograph from 10/29/2019. Correlation with history and physical exam recommended. 2. Patchy and hazy opacity throughout the mid and lower left lung, suspicious for infiltrate given the provided history of cough. 3. Associated small layering left pleural effusion. Electronically Signed   By: Jeannine Boga M.D.   On: 01/31/2020 04:29   DG HIP UNILAT WITH PELVIS 2-3 VIEWS LEFT  Result Date: 01/31/2020 CLINICAL DATA:  Fall, pain. EXAM: DG HIP (WITH OR WITHOUT PELVIS) 2-3V LEFT COMPARISON:  07/06/2016 left hip radiographs. FINDINGS: Sequela of left hip arthroplasty. Hardware components are well seated and articulate appropriately. No concerning perihardware lucency. Left hip heterotopic ossification. No soft tissue abnormalities. Limited visualization of the right hip demonstrates no acute abnormalities and mild osteoarthrosis. Sequela of left pelvic  hernia repair.  Lumbar spondylosis. IMPRESSION: No acute osseous abnormality. Sequela of left hip arthroplasty without adverse features. Mild right hip osteoarthrosis. Electronically Signed   By: Primitivo Gauze M.D.   On: 01/31/2020 14:37     EKG: Independently reviewed.  Sinus rhythm, QTC 448, bifascicular block  Assessment/Plan Principal Problem:   Aspiration pneumonia (HCC) Active Problems:   GERD (gastroesophageal reflux disease)   Parkinson's disease (HCC)   BPH with obstruction/lower urinary tract symptoms   Hyponatremia   HTN (hypertension)   Depression   Fall   Multiple rib fractures   Cellulitis of left lower leg   Asthma   Possible aspiration pneumonia ([place]): CXR showed patchy and hazy opacity throughout the mid and lower left lung. Given his hx of Parkinson's disease, patient likely has aspiration pneumonia.  No fever or leukocytosis, clinically not septic.  No oxygen desaturation.  - Will admit to med-surg bed as inpt - IV Rocephin and flagyl (patient received 1 dose of azithromycin in ED) - Mucinex for cough  - Bronchodilators - Urine legionella and S. pneumococcal antigen - Follow up blood culture x2, sputum culture  -SLP  Asthma: No wheezing on auscultation -Bronchodilators -Singulair  GERD (gastroesophageal reflux disease) -Protonix  Parkinson's disease (HCC) -Continue Sinemet  BPH with obstruction/lower urinary tract symptoms -Flomax  Hyponatremia: Most likely due to poor oral intake, dehydration and diuretic use - Will check urine sodium, urine osmolality, serum osmolality. - check TSH - Fluid restriction - hold spironolactone - IVF: 250 cc of NS in ED, will continue with IV normal saline at 75 mL/h - f/u by BMP q8h - avoid over correction too fast due to risk of central pontine myelinolysis  HTN (hypertension) -IV hydralazine. -Hold spironolactone due to hyponatremia  Depression -Celexa  Fall -PT/OT  Multiple rib fractures -As  needed Percocet and Tylenol  Cellulitis of left lower leg: -Rocephin IV -Lower extremity Doppler to rule out DVT -Follow-up of blood culture -ESR, CRP      DVT ppx: SCD Code Status: Full code per his daughter Family Communication:    Yes, patient's daughter  at bed side Disposition Plan:  Anticipate discharge back to previous ALF environment Consults called:  none Admission status: Med-surg bed as inpt        Status is: Inpatient  Remains inpatient appropriate because:Inpatient level of care appropriate due to severity of illness.  Patient has multiple comorbidities, now presents with aspiration pneumonia, fall, hyponatremia with sodium 120,, multiple rib fracture. His presentation is highly complicated.  Patient is at high risk of deteriorating.  Need to be treated in the hospital for at least 2 days.   Dispo: The patient is from: ALF              Anticipated d/c is to: ALF              Anticipated d/c date is: 2 days              Patient currently is not medically stable to d/c.            Date of Service 01/31/2020    [facility]   If 7PM-7AM, please contact night-coverage [email or web address] 01/31/2020, 6:24 PM

## 2020-01-31 NOTE — ED Triage Notes (Signed)
Patient from [facility] with increased mucus that is causing a cough and reports unable to get it up.  Patient with very congested cough heard during triage.

## 2020-01-31 NOTE — ED Notes (Signed)
Patient to triage via EMS for shortness of breath from.  Per EMS nsg staff reports increased mucus production and having difficulty getting it up.  Pulse oxi 93-96% on room air, hr 76, bp 159/76.

## 2020-01-31 NOTE — ED Notes (Signed)
Lab called and ask to come draw labs as patient is difficulty stick.

## 2020-01-31 NOTE — ED Notes (Signed)
Pt called to update vital signs, no answer. Pt not in lobby

## 2020-01-31 NOTE — ED Notes (Signed)
Pt called for vital sign recheck. No answer. Pt not visualized in the lobby

## 2020-02-01 DIAGNOSIS — J45909 Unspecified asthma, uncomplicated: Secondary | ICD-10-CM

## 2020-02-01 DIAGNOSIS — L03116 Cellulitis of left lower limb: Secondary | ICD-10-CM

## 2020-02-01 DIAGNOSIS — J690 Pneumonitis due to inhalation of food and vomit: Principal | ICD-10-CM

## 2020-02-01 LAB — BASIC METABOLIC PANEL
Anion gap: 8 (ref 5–15)
Anion gap: 9 (ref 5–15)
Anion gap: 9 (ref 5–15)
BUN: 12 mg/dL (ref 8–23)
BUN: 14 mg/dL (ref 8–23)
BUN: 16 mg/dL (ref 8–23)
CO2: 23 mmol/L (ref 22–32)
CO2: 22 mmol/L (ref 22–32)
CO2: 22 mmol/L (ref 22–32)
Calcium: 7.8 mg/dL — ABNORMAL LOW (ref 8.9–10.3)
Calcium: 7.6 mg/dL — ABNORMAL LOW (ref 8.9–10.3)
Calcium: 7.7 mg/dL — ABNORMAL LOW (ref 8.9–10.3)
Chloride: 94 mmol/L — ABNORMAL LOW (ref 98–111)
Chloride: 95 mmol/L — ABNORMAL LOW (ref 98–111)
Chloride: 96 mmol/L — ABNORMAL LOW (ref 98–111)
Creatinine, Ser: 0.45 mg/dL — ABNORMAL LOW (ref 0.61–1.24)
Creatinine, Ser: 0.51 mg/dL — ABNORMAL LOW (ref 0.61–1.24)
Creatinine, Ser: 0.49 mg/dL — ABNORMAL LOW (ref 0.61–1.24)
GFR calc Af Amer: >60 mL/min (ref >60)
GFR calc Af Amer: >60 mL/min (ref >60)
GFR calc Af Amer: >60 mL/min (ref >60)
GFR calc non Af Amer: >60 mL/min (ref >60)
GFR calc non Af Amer: >60 mL/min (ref >60)
GFR calc non Af Amer: >60 mL/min (ref >60)
Glucose, Bld: 94 mg/dL (ref 70–99)
Glucose, Bld: 108 mg/dL — ABNORMAL HIGH (ref 70–99)
Glucose, Bld: 121 mg/dL — ABNORMAL HIGH (ref 70–99)
Potassium: 3.7 mmol/L (ref 3.5–5.1)
Potassium: 3.8 mmol/L (ref 3.5–5.1)
Potassium: 4 mmol/L (ref 3.5–5.1)
Sodium: 125 mmol/L — ABNORMAL LOW (ref 135–145)
Sodium: 126 mmol/L — ABNORMAL LOW (ref 135–145)
Sodium: 127 mmol/L — ABNORMAL LOW (ref 135–145)

## 2020-02-01 LAB — CBC
HCT: 31.6 % — ABNORMAL LOW (ref 39.0–52.0)
Hemoglobin: 11.3 g/dL — ABNORMAL LOW (ref 13.0–17.0)
MCH: 31 pg (ref 26.0–34.0)
MCHC: 35.8 g/dL (ref 30.0–36.0)
MCV: 86.8 fL (ref 80.0–100.0)
Platelets: 348 10*3/uL (ref 150–400)
RBC: 3.64 MIL/uL — ABNORMAL LOW (ref 4.22–5.81)
RDW: 14 % (ref 11.5–15.5)
WBC: 9.2 10*3/uL (ref 4.0–10.5)
nRBC: 0 % (ref 0.0–0.2)

## 2020-02-01 LAB — SODIUM, URINE, RANDOM: Sodium, Ur: 27 mmol/L

## 2020-02-01 LAB — EXPECTORATED SPUTUM ASSESSMENT W GRAM STAIN, RFLX TO RESP C

## 2020-02-01 LAB — TSH: TSH: 1.957 u[IU]/mL (ref 0.350–4.500)

## 2020-02-01 LAB — C-REACTIVE PROTEIN: CRP: 5.1 mg/dL — ABNORMAL HIGH (ref <1.0)

## 2020-02-01 LAB — OSMOLALITY, URINE: Osmolality, Ur: 329 mOsm/kg (ref 300–900)

## 2020-02-01 LAB — OSMOLALITY: Osmolality: 257 mOsm/kg — ABNORMAL LOW (ref 275–295)

## 2020-02-01 LAB — STREP PNEUMONIAE URINARY ANTIGEN: Strep Pneumo Urinary Antigen: NEGATIVE

## 2020-02-01 MED ORDER — LORATADINE 10 MG PO TABS
10.0000 mg | ORAL_TABLET | Freq: Every day | ORAL | Status: DC
  Administered 2020-02-01 – 2020-02-03 (×3): 10 mg via ORAL
  Filled 2020-02-01 (×3): qty 1

## 2020-02-01 MED ORDER — MONTELUKAST SODIUM 10 MG PO TABS
10.0000 mg | ORAL_TABLET | Freq: Every day | ORAL | Status: DC
  Administered 2020-02-01 – 2020-02-02 (×3): 10 mg via ORAL
  Filled 2020-02-01 (×4): qty 1

## 2020-02-01 MED ORDER — TAMSULOSIN HCL 0.4 MG PO CAPS
0.4000 mg | ORAL_CAPSULE | Freq: Every day | ORAL | Status: DC
  Administered 2020-02-01 – 2020-02-03 (×3): 0.4 mg via ORAL
  Filled 2020-02-01 (×3): qty 1

## 2020-02-01 MED ORDER — RISAQUAD PO CAPS
1.0000 | ORAL_CAPSULE | Freq: Every day | ORAL | Status: DC
  Administered 2020-02-01 – 2020-02-03 (×3): 1 via ORAL
  Filled 2020-02-01 (×3): qty 1

## 2020-02-01 MED ORDER — ALUM & MAG HYDROXIDE-SIMETH 200-200-20 MG/5ML PO SUSP: 30.0000 mL | Freq: Four times a day (QID) | ORAL | Status: DC | PRN

## 2020-02-01 MED ORDER — SODIUM CHLORIDE 0.9 % IV SOLN: INTRAVENOUS | Status: AC

## 2020-02-01 MED ORDER — CITALOPRAM HYDROBROMIDE 20 MG PO TABS
10.0000 mg | ORAL_TABLET | ORAL | Status: DC
  Administered 2020-02-01 – 2020-02-03 (×2): 10 mg via ORAL
  Filled 2020-02-01 (×3): qty 1

## 2020-02-01 MED ORDER — GUAIFENESIN ER 600 MG PO TB12
600.0000 mg | ORAL_TABLET | Freq: Two times a day (BID) | ORAL | Status: DC
  Administered 2020-02-01 – 2020-02-03 (×5): 600 mg via ORAL
  Filled 2020-02-01 (×7): qty 1

## 2020-02-01 MED ORDER — FLUTICASONE PROPIONATE 50 MCG/ACT NA SUSP
2.0000 | Freq: Every day | NASAL | Status: DC
  Administered 2020-02-02 – 2020-02-03 (×2): 2 via NASAL
  Filled 2020-02-01 (×2): qty 16

## 2020-02-01 MED ORDER — ACETYLCYSTEINE 20 % IN SOLN
2.0000 mL | Freq: Two times a day (BID) | RESPIRATORY_TRACT | Status: DC
  Administered 2020-02-01 – 2020-02-03 (×5): 2 mL via RESPIRATORY_TRACT
  Filled 2020-02-01 (×8): qty 4

## 2020-02-01 MED ORDER — ACETAMINOPHEN 325 MG PO TABS: 650.0000 mg | ORAL_TABLET | Freq: Four times a day (QID) | ORAL | Status: DC | PRN

## 2020-02-01 MED ORDER — PANTOPRAZOLE SODIUM 40 MG PO TBEC
40.0000 mg | DELAYED_RELEASE_TABLET | Freq: Every day | ORAL | Status: DC
  Administered 2020-02-01 – 2020-02-03 (×3): 40 mg via ORAL
  Filled 2020-02-01 (×3): qty 1

## 2020-02-01 MED ORDER — MAGNESIUM HYDROXIDE 400 MG/5ML PO SUSP
30.0000 mL | Freq: Every day | ORAL | Status: DC | PRN
  Filled 2020-02-01: qty 30

## 2020-02-01 MED ORDER — SODIUM CHLORIDE 0.9 % IV SOLN
3.0000 g | Freq: Four times a day (QID) | INTRAVENOUS | Status: DC
  Administered 2020-02-01 – 2020-02-03 (×9): 3 g via INTRAVENOUS
  Filled 2020-02-01 (×3): qty 3
  Filled 2020-02-01 (×2): qty 8
  Filled 2020-02-01 (×3): qty 3
  Filled 2020-02-01 (×2): qty 8
  Filled 2020-02-01: qty 3
  Filled 2020-02-01: qty 8

## 2020-02-01 MED ORDER — CARBIDOPA-LEVODOPA 25-100 MG PO TABS
1.0000 | ORAL_TABLET | Freq: Three times a day (TID) | ORAL | Status: DC
  Administered 2020-02-01 – 2020-02-03 (×7): 1 via ORAL
  Filled 2020-02-01 (×10): qty 1

## 2020-02-01 MED ORDER — MOMETASONE FURO-FORMOTEROL FUM 200-5 MCG/ACT IN AERO
2.0000 | INHALATION_SPRAY | Freq: Two times a day (BID) | RESPIRATORY_TRACT | Status: DC
  Administered 2020-02-01 – 2020-02-03 (×6): 2 via RESPIRATORY_TRACT
  Filled 2020-02-01: qty 8.8

## 2020-02-01 NOTE — ED Notes (Signed)
Pt cleaned and changed, placed on male primo-fit external catheter, left skin tear dressed with nonstick dressing and wrapped, pt has no further complaints and is resting comfortably.

## 2020-02-01 NOTE — Consult Note (Addendum)
WOC Nurse Consult Note: Reason for Consult: Consult requested for left elbow.  Pt fell prior to admission and has a full thickness abrasion which has previously  been re approximated to 75% over the wound bed, dark reddish-purple and bruised and 25% red and moist exposed wound bed.  Small amt tan drainage. Affected area is approx 15X10X.2cm Dressing procedure/placement/frequency: Topical treatment orders provided for bedside nurses to perform Q day as follows: Leave steristrips in place to left elbow until they fall off.  Apply Xeroform gauze to area Q day, then cover with abd pad and kerelx.  Please re-consult if further assistance is needed.  Thank-you,  Cammie Mcgee MSN, RN, CWOCN, [place], CNS [phone]

## 2020-02-01 NOTE — Evaluation (Signed)
Occupational Therapy Evaluation Patient Details Name: Brandon Calhoun MRN: [id number] DOB: 1930/01/26 Today's Date: 02/01/2020    History of Present Illness Pt is an 86 y.o. male with medical history significant of hypertension, asthma, GERD, depression, anxiety, BPH, Parkinson's disease, who presents with fall, cough, shortness breath. Acute appearing fractures of the left posterolateral fourth through ninth ribs, new as compared to prior radiograph from05/13/2021.   Clinical Impression   Pt was seen for OT evaluation this date. Prior to hospital admission, pt was MOD I with fxl mobility and required some help with ADLs including dressing and gets assist through his ALF facility [facility]) for med [employer] and meals. Currently pt demonstrates impairments as described below (See OT problem list) which functionally limit his ability to perform ADL/self-care tasks. Pt currently requires CGA +2 (for safety and line mgt) for ADL transfers, Mod/MAX A for dressing tasks (appears to be his reported baseline), and CGA for fxl mobility with RW.  Pt would benefit from skilled OT to address noted impairments and functional limitations (see below for any additional details) in order to maximize safety and independence while minimizing falls risk and caregiver burden. Upon hospital discharge, recommend STR to maximize pt safety and return to PLOF.     Follow Up Recommendations  SNF    Equipment Recommendations  None recommended by OT (pt reports having all necessary equipment)    Recommendations for Other Services       Precautions / Restrictions Precautions Precautions: Fall Precaution Comments: several falls recently, rib fx Restrictions Weight Bearing Restrictions: No      Mobility Bed Mobility Overal bed mobility: Needs Assistance Bed Mobility: Supine to Sit     Supine to sit: Mod assist;HOB elevated     General bed mobility comments: Pt up to EOB sitting with PT when OT  presents  Transfers Overall transfer level: Needs assistance Equipment used: Rolling walker (2 wheeled) Transfers: Sit to/from Stand Sit to Stand: +2 safety/equipment;From elevated surface;Min guard              Balance Overall balance assessment: Needs assistance Sitting-balance support: Feet supported Sitting balance-Leahy Scale: Fair Sitting balance - Comments: pt able to sit EOB for several minutes and take medicine with RN with and without UE support, R lateral lean noted (may have been due to bed surface) Postural control: Right lateral lean Standing balance support: Bilateral upper extremity supported Standing balance-Leahy Scale: Fair Standing balance comment: CGA for ambulation, reliant on RW during ambulation but able to statically stand with at least unilateral support                           ADL either performed or assessed with clinical judgement   ADL Overall ADL's : Needs assistance/impaired                 Upper Body Dressing : Moderate assistance;Sitting   Lower Body Dressing: Moderate assistance;Maximal assistance;Sit to/from stand   Toilet Transfer: Minimal assistance;Ambulation;RW;BSC;Regular Toilet;Grab bars (BSC over standard commode to elevate)   Toileting- Clothing Manipulation and Hygiene: Minimal assistance;Sit to/from stand       Functional mobility during ADLs: Minimal assistance;Rolling walker (cues for posture/cervical spine extension to better visualize pathway/monitor for hazards.)       Vision Baseline Vision/History: Wears glasses Wears Glasses: At all times Patient Visual Report: No change from baseline       Perception     Praxis  Pertinent Vitals/Pain Pain Assessment: 0-10 Pain Score: 0-No pain Faces Pain Scale: No hurt Pain Location: Pt reports no pain, does grimmace some with leaning FWD in sitting d/t rib fxs     Hand Dominance Right   Extremity/Trunk Assessment Upper Extremity  Assessment Upper Extremity Assessment: RUE deficits/detail;LUE deficits/detail RUE Deficits / Details: unable to lift RUE against gravity without assist, full AAROM. Limited d/t OA per pt's dtr. Grip MMT 4/5. RUE Coordination: decreased fine motor LUE Deficits / Details: Able to perform shld flexion against gravity ~70*. Limited 2/2 OA per pt's dtr. Grip MMT 4/5 LUE Coordination: decreased fine motor   Lower Extremity Assessment Lower Extremity Assessment: Defer to PT evaluation RLE Deficits / Details: able to march in place with BUE support. Able to lift against gravity on bed RLE Coordination: decreased fine motor LLE Deficits / Details: able to march in place with BUE support. Able to lift against gravity on bed LLE Coordination: decreased fine motor   Cervical / Trunk Assessment Cervical / Trunk Assessment: Kyphotic (some curvature of spine as well, dtr reports she thinks this is from h/o hip fx.)   Communication Communication Communication: No difficulties   Cognition Arousal/Alertness: Awake/alert Behavior During Therapy: WFL for tasks assessed/performed Overall Cognitive Status: Within Functional Limits for tasks assessed                                 General Comments: pt oriented to self, place, location, did forget about his recent fall, but family (dtr) endorsed he was at his baseline level of cognition   General Comments       Exercises Other Exercises Other Exercises: OT facilitates ed re: importance of posture with fxl mobility to scan environment and prepare/adjust pathway when obstacles/fall hazards arrive. OT also educates re: planning ahead, not waiting until severe fatigue sets in to attempt to make it to chair as his dtr describes at least one fall happening in this manor.   Shoulder Instructions      Home Living Family/patient expects to be discharged to:: Assisted living                             Home Equipment: Dan Humphreys - 2  wheels;Walker - 4 wheels;Grab bars - toilet;Grab bars - tub/shower;Shower seat;Other (comment)   Additional Comments: [facility] (2nd floor apt; takes elevator to get to dining room). 3 falls in the last two weeks.      Prior Functioning/Environment Level of Independence: Needs assistance  Gait / Transfers Assistance Needed: Ambulates with RW most of the time but uses rollator outside. Enjoys walking laps on the walking path for exercise. ADL's / Homemaking Assistance Needed: Assist for dressing, laundry, medications, and showers. Ambulates to dining hall 3 times a day for meals with rollator.            OT Problem List: Decreased strength;Decreased range of motion;Decreased activity tolerance;Impaired balance (sitting and/or standing)      OT Treatment/Interventions: Self-care/ADL training;Therapeutic exercise;DME and/or AE instruction;Therapeutic activities;Balance training;Patient/family education    OT Goals(Current goals can be found in the care plan section) Acute Rehab OT Goals Patient Stated Goal: to get his strength back OT Goal Formulation: With patient Time For Goal Achievement: 02/15/20 Potential to Achieve Goals: Good ADL Goals Pt Will Transfer to Toilet: with supervision;ambulating;grab bars;bedside commode (with RW to commode in restroom, BSC over to elevate  like home setup) Pt Will Perform Toileting - Clothing Manipulation and hygiene: with supervision;sit to/from stand Pt/caregiver will Perform Home Exercise Program: Increased ROM;Both right and left upper extremity;With Supervision  OT Frequency: Min 1X/week   Barriers to D/C:            Co-evaluation PT/OT/SLP Co-Evaluation/Treatment: Yes Reason for Co-Treatment: To address functional/ADL transfers PT goals addressed during session: Mobility/safety with mobility;Balance;Proper use of DME OT goals addressed during session: ADL's and self-care;Proper use of Adaptive equipment and DME      AM-PAC OT "6  Clicks" Daily Activity     Outcome Measure Help from another person eating meals?: None Help from another person taking care of personal grooming?: A Little Help from another person toileting, which includes using toliet, bedpan, or urinal?: A Little Help from another person bathing (including washing, rinsing, drying)?: A Little Help from another person to put on and taking off regular upper body clothing?: A Lot Help from another person to put on and taking off regular lower body clothing?: A Lot 6 Click Score: 17   End of Session Equipment Utilized During Treatment: Gait belt;Rolling walker Nurse Communication: Mobility status  Activity Tolerance: Patient tolerated treatment well Patient left: in chair;with call bell/phone within reach;with family/visitor present;with nursing/sitter in room (nurse present to set up breathing treatment.)  OT Visit Diagnosis: Unsteadiness on feet (R26.81);Muscle weakness (generalized) (M62.81)                Time: 7510-2585 OT Time Calculation (min): 26 min Charges:  OT General Charges $OT Visit: 1 Visit OT Evaluation $OT Eval Moderate Complexity: 1 Mod OT Treatments $Self Care/Home Management : 8-22 mins  Rejeana Brock, MS, OTR/L ascom [phone] 02/01/20, 11:35 AM

## 2020-02-01 NOTE — Evaluation (Signed)
Physical Therapy Evaluation Patient Details Name: Brandon Calhoun MRN: [id number] DOB: Mar 18, 1930 Today's Date: 02/01/2020   History of Present Illness  Pt is an 86 y.o. male with medical history significant of hypertension, asthma, GERD, depression, anxiety, BPH, Parkinson's disease, who presents with fall, cough, shortness breath. Acute appearing fractures of the left posterolateral fourth through ninth ribs, new as compared to prior radiograph from05/13/2021.    Clinical Impression  Patient alert, in bed with family at bedside. Pt did not report pain without movement, some per family with pt getting up from a chair, but pt did not endorse pain during session. At baseline pt lives at [facility], assistance for bathing/dressing/med management, and pt ambulates with rollator to dining hall normally 3 times a day. RW used for in home ambulation. Family reported 3 falls in the last 2 weeks.  The patient was unable to lift RUE against gravity due to weakness (also with skin tears noted), but grip strength WFLs. Pt also challenged with LUE movement, but AAROM full for pt. He was able to initiate movement to sit EOB, but ultimately needed modA for complete trunk elevation, LE assistance and repositioning at EOB. He was able to sit for several minutes for medication administration for RN with and without UE support. Sit <> stand with RW and CGAx2 for safety, and then ambulated ~31ft with RW and CGA/supervision. shuffled gait noted with decreased step height/length bilaterally. Able to mildly correct with cueing, kyphotic posture, mildly improved with cueing to bring his head up and very decreased gait velocity. Pt endorsed that he did feel weaker than he normally does at baseline. MinA provided for eccentric lowering to recliner after ambulation.  Overall the patient demonstrated deficits (see "PT Problem List") that impede the patient's functional abilities, safety, and mobility and would benefit from  skilled PT intervention. Recommendation is SNF due to pts decrease in functional mobility/ambulation as well as increased falls at home to maximize function, safety, and mobility.     Follow Up Recommendations SNF    Equipment Recommendations  None recommended by PT;Other (comment) (pt has RW, rollator and shower chair at home)    Recommendations for Other Services       Precautions / Restrictions Precautions Precautions: Fall Precaution Comments: several falls recently, rib fx Restrictions Weight Bearing Restrictions: No      Mobility  Bed Mobility Overal bed mobility: Needs Assistance Bed Mobility: Supine to Sit     Supine to sit: Mod assist;HOB elevated     General bed mobility comments: pt able to initiate the movement but did need modA to come up to full sitting and for repositioning at EOB  Transfers Overall transfer level: Needs assistance Equipment used: Rolling walker (2 wheeled) Transfers: Sit to/from Stand Sit to Stand: +2 safety/equipment;From elevated surface;Min guard            Ambulation/Gait   Gait Distance (Feet): 60 Feet Assistive device: Rolling walker (2 wheeled)   Gait velocity: significantly decreased   General Gait Details: shuffled gait noted. decreased step height/length bilaterally. Able to mildly correct with cueing, kyphotic posture, mildly improved with cueing to bring his head up.  Stairs            Wheelchair Mobility    Modified Rankin (Stroke Patients Only)       Balance Overall balance assessment: Needs assistance Sitting-balance support: Feet supported Sitting balance-Leahy Scale: Fair Sitting balance - Comments: pt able to sit EOB for several minutes and take medicine with  RN with and without UE support, R lateral lean noted (may have been due to bed surface) Postural control: Right lateral lean Standing balance support: Bilateral upper extremity supported Standing balance-Leahy Scale: Fair Standing balance  comment: CGA for ambulation, reliant on RW during ambulation but able to statically stand with at least unilateral support                             Pertinent Vitals/Pain Pain Assessment: Faces Faces Pain Scale: No hurt    Home Living Family/patient expects to be discharged to:: Assisted living               Home Equipment: Walker - 2 wheels;Walker - 4 wheels;Grab bars - toilet;Grab bars - tub/shower;Shower seat;Other (comment) (bed rail, lift chair) Additional Comments: [facility] (2nd floor apt; takes elevator to get to dining room). 3 falls in the last two weeks    Prior Function Level of Independence: Needs assistance   Gait / Transfers Assistance Needed: Ambulates with RW most of the time but uses rollator outside.  ADL's / Homemaking Assistance Needed: Assist for dressing, laundry, medications, and showers, ambulates to dining hall 3 times a day for meals with rollator        Hand Dominance   Dominant Hand: Right    Extremity/Trunk Assessment   Upper Extremity Assessment Upper Extremity Assessment: Defer to OT evaluation;RUE deficits/detail;LUE deficits/detail RUE Deficits / Details: unable to lift RUE against gravity without assist, full AAROM RUE Coordination: decreased fine motor LUE Deficits / Details: able to lift against gravity ~70degrees LUE Coordination: decreased fine motor    Lower Extremity Assessment Lower Extremity Assessment: Generalized weakness;RLE deficits/detail;LLE deficits/detail RLE Deficits / Details: able to march in place with BUE support. Able to lift against gravity on bed RLE Coordination: decreased fine motor LLE Deficits / Details: able to march in place with BUE support. Able to lift against gravity on bed LLE Coordination: decreased fine motor    Cervical / Trunk Assessment Cervical / Trunk Assessment: Kyphotic  Communication   Communication: No difficulties  Cognition Arousal/Alertness:  Awake/alert Behavior During Therapy: WFL for tasks assessed/performed Overall Cognitive Status: Within Functional Limits for tasks assessed                                 General Comments: pt oriented to self, place, location, did forget about his recent fall, but family endorsed he was at his baseline level of cognition      General Comments      Exercises     Assessment/Plan    PT Assessment Patient needs continued PT services  PT Problem List Decreased strength;Decreased mobility;Decreased activity tolerance;Decreased balance;Pain       PT Treatment Interventions DME instruction;Therapeutic exercise;Gait training;Balance training;Neuromuscular re-education;Functional mobility training;Therapeutic activities;Patient/family education    PT Goals (Current goals can be found in the Care Plan section)  Acute Rehab PT Goals Patient Stated Goal: to get his strength back PT Goal Formulation: With patient Time For Goal Achievement: 02/15/20 Potential to Achieve Goals: Fair    Frequency Min 2X/week   Barriers to discharge Decreased caregiver support      Co-evaluation PT/OT/SLP Co-Evaluation/Treatment: Yes Reason for Co-Treatment: To address functional/ADL transfers PT goals addressed during session: Mobility/safety with mobility;Balance;Proper use of DME OT goals addressed during session: ADL's and self-care;Proper use of Adaptive equipment and DME  AM-PAC PT "6 Clicks" Mobility  Outcome Measure Help needed turning from your back to your side while in a flat bed without using bedrails?: A Lot Help needed moving from lying on your back to sitting on the side of a flat bed without using bedrails?: A Lot Help needed moving to and from a bed to a chair (including a wheelchair)?: A Lot Help needed standing up from a chair using your arms (e.g., wheelchair or bedside chair)?: A Little Help needed to walk in hospital room?: A Little Help needed climbing 3-5  steps with a railing? : A Lot 6 Click Score: 14    End of Session Equipment Utilized During Treatment: Gait belt Activity Tolerance: Patient tolerated treatment well Patient left: in chair;with call bell/phone within reach;with family/visitor present Nurse Communication: Mobility status PT Visit Diagnosis: Other abnormalities of gait and mobility (R26.89);Muscle weakness (generalized) (M62.81);Difficulty in walking, not elsewhere classified (R26.2);History of falling (Z91.81);Pain Pain - Right/Left: Left Pain - part of body:  (ribs/trunk)    Time: 4287-6811 PT Time Calculation (min) (ACUTE ONLY): 45 min   Charges:   PT Evaluation $PT Eval Low Complexity: 1 Low PT Treatments $Therapeutic Exercise: 8-22 mins       Olga Coaster PT, DPT 10:55 AM,02/01/20

## 2020-02-01 NOTE — ED Notes (Signed)
Patient's daughter, Darl Pikes, called and notified of patient's move to room 114.

## 2020-02-01 NOTE — Progress Notes (Signed)
PROGRESS NOTE    Brandon Calhoun  ZOX:[id number] DOB: 08/28/1929 DOA: 01/31/2020 PCP: Karie SchwalbeLetvak, Richard I, MD   Brief Narrative:  HPI: Brandon Shutterheodore H Criss is a 86 y.o. male with medical history significant of hypertension, asthma, GERD, depression, anxiety, BPH, Parkinson's disease, who presents with fall, cough, shortness breath.  Patient has cough and shortness of breath in past several days, which has been progressively worsening.  Patient has congested cough with mucus production.  Denies chest pain, fever or chills.  Patient does not have nausea vomiting, diarrhea or abdominal pain.  No symptoms of UTI.  No unilateral numbness or tingling his activities.  No facial droop or slurred speech.  Patient has left lower leg cellulitis, heart rate still taking Augmentin. Patient had multiple falling recently. Patient has small skin tear in her elbows, with bruise in left hip and head.  Patient is alert oriented x3.  Per her daughter, patient mental status is at his normal baseline.  8/16: Patient seen and examined.  Daughter at bedside.  Patient endorsing some difficulty bringing up his secretions.  Complains of pain in the left elbow due to skin tear   Assessment & Plan:   Principal Problem:   Aspiration pneumonia (HCC) Active Problems:   GERD (gastroesophageal reflux disease)   Parkinson's disease (HCC)   BPH with obstruction/lower urinary tract symptoms   Hyponatremia   HTN (hypertension)   Depression   Fall   Multiple rib fractures   Cellulitis of left lower leg   Asthma   Possible aspiration pneumonia (HCC)  CXR showed patchy and hazy opacity throughout the mid and lower left lung. Given his hx of Parkinson's disease, patient likely has aspiration pneumonia.  No fever or leukocytosis, clinically not septic.  No oxygen desaturation. Plan: DC IV Rocephin and Flagyl Continue Unasyn Mucinex twice daily Bronchodilator therapy Twice daily Mucomyst Follow-up urinary  antigens Follow blood and sputum cultures Ensure patient get Parkinson's medications  Asthma  No wheezing on auscultation -Bronchodilators -Singulair  GERD (gastroesophageal reflux disease) -Protonix  Parkinson's disease (HCC) -Continue Sinemet  BPH with obstruction/lower urinary tract symptoms -Flomax  Hyponatremia  Most likely due to poor oral intake, dehydration and diuretic use Plan: - Fluid restriction - hold spironolactone - IVF: 250 cc of NS in ED, will continue with IV normal saline at 75 mL/h - f/u by BMP q8h - avoid over correction too fast due to risk of central pontine myelinolysis  HTN (hypertension) -IV hydralazine. -Hold spironolactone due to hyponatremia  Depression -Celexa  Fall -PT/OT  Multiple rib fractures -As needed Percocet and Tylenol  Cellulitis of left lower leg: Unasyn IV as above Does not appear acute Per daughter improving over interval    DVT prophylaxis: Lovenox Code Status: Full Family Communication: Daughter at bedside Disposition Plan: Status is: Inpatient  Remains inpatient appropriate because:Inpatient level of care appropriate due to severity of illness   Dispo: The patient is from: Home              Anticipated d/c is to: Home              Anticipated d/c date is: 2 days              Patient currently is not medically stable to d/c.  Receiving IV antibiotics for a presumed aspiration pneumonia.  Disposition plan pending       Consultants:   None  Procedures:   None  Antimicrobials:   Unasyn   Subjective: Seen and examined.  Reports of cough with difficulty clearing secretions.  Complains of pain in left elbow Objective: Vitals:   02/01/20 0430 02/01/20 0500 02/01/20 0530 02/01/20 0600  BP: (!) 126/56 (!) 113/58 (!) 141/62 (!) 128/57  Pulse: 67 64 62 63  Resp: 14 14 14 13   Temp:      TempSrc:      SpO2: 95% 90% 96% 94%  Weight:      Height:        Intake/Output Summary (Last 24  hours) at 02/01/2020 1504 Last data filed at 02/01/2020 1150 Gross per 24 hour  Intake 1911.59 ml  Output --  Net 1911.59 ml   Filed Weights   01/31/20 0402  Weight: 65.8 kg    Examination:  General exam: No acute distress.  Appears stated age. Respiratory system: Coarse breath sounds on the left.  No wheeze.  Normal work of breathing.  No oxygen requirement. Cardiovascular system: S1 & S2 heard, RRR. No JVD, murmurs, rubs, gallops or clicks. No pedal edema. Gastrointestinal system: Abdomen is nondistended, soft and nontender. No organomegaly or masses felt. Normal bowel sounds heard. Central nervous system: Alert and oriented.  Flattened affect.  No focal deficits Extremities: Symmetric 5 x 5 power.  Left lower extremity swollen, warm, tender to touch Skin: No rashes, lesions or ulcers Psychiatry: Judgement and insight appear normal.  Flattened affect    Data Reviewed: I have personally reviewed following labs and imaging studies  CBC: Recent Labs  Lab 01/31/20 0952 02/01/20 0629  WBC 9.0 9.2  NEUTROABS 6.6  --   HGB 11.7* 11.3*  HCT 31.4* 31.6*  MCV 84.6 86.8  PLT 352 348   Basic Metabolic Panel: Recent Labs  Lab 01/31/20 0952 02/01/20 0140  NA 120* 125*  K 3.8 3.7  CL 88* 94*  CO2 21* 23  GLUCOSE 106* 94  BUN 12 12  CREATININE 0.43* 0.45*  CALCIUM 7.8* 7.8*   GFR: Estimated Creatinine Clearance: 59.4 mL/min (A) (by C-G formula based on SCr of 0.45 mg/dL (L)). Liver Function Tests: Recent Labs  Lab 01/31/20 0952  AST 23  ALT 20  ALKPHOS 123  BILITOT 1.1  PROT 5.6*  ALBUMIN 2.9*   No results for input(s): LIPASE, AMYLASE in the last 168 hours. No results for input(s): AMMONIA in the last 168 hours. Coagulation Profile: No results for input(s): INR, PROTIME in the last 168 hours. Cardiac Enzymes: Recent Labs  Lab 01/31/20 1343  CKTOTAL 95   BNP (last 3 results) No results for input(s): PROBNP in the last 8760 hours. HbA1C: No results for  input(s): HGBA1C in the last 72 hours. CBG: No results for input(s): GLUCAP in the last 168 hours. Lipid Profile: No results for input(s): CHOL, HDL, LDLCALC, TRIG, CHOLHDL, LDLDIRECT in the last 72 hours. Thyroid Function Tests: Recent Labs    02/01/20 0629  TSH 1.957   Anemia Panel: No results for input(s): VITAMINB12, FOLATE, FERRITIN, TIBC, IRON, RETICCTPCT in the last 72 hours. Sepsis Labs: Recent Labs  Lab 01/31/20 1343  LATICACIDVEN 1.1    Recent Results (from the past 240 hour(s))  SARS Coronavirus 2 by RT PCR (hospital order, performed in [facility] hospital lab) Nasopharyngeal Nasopharyngeal Swab     Status: None   Collection Time: 01/31/20  6:11 PM   Specimen: Nasopharyngeal Swab  Result Value Ref Range Status   SARS Coronavirus 2 NEGATIVE NEGATIVE Final    Comment: (NOTE) SARS-CoV-2 target nucleic acids are NOT DETECTED.  The SARS-CoV-2 RNA is generally  detectable in upper and lower respiratory specimens during the acute phase of infection. The lowest concentration of SARS-CoV-2 viral copies this assay can detect is 250 copies / mL. A negative result does not preclude SARS-CoV-2 infection and should not be used as the sole basis for treatment or other patient management decisions.  A negative result may occur with improper specimen collection / handling, submission of specimen other than nasopharyngeal swab, presence of viral mutation(s) within the areas targeted by this assay, and inadequate number of viral copies (<250 copies / mL). A negative result must be combined with clinical observations, patient history, and epidemiological information.  Fact Sheet for Patients:   BoilerBrush.com.cy  Fact Sheet for Healthcare Providers: https://pope.com/  This test is not yet approved or  cleared by the [place] FDA and has been authorized for detection and/or diagnosis of SARS-CoV-2 by FDA under an Emergency  Use Authorization (EUA).  This EUA will remain in effect (meaning this test can be used) for the duration of the COVID-19 declaration under Section 564(b)(1) of the Act, 21 U.S.C. section 360bbb-3(b)(1), unless the authorization is terminated or revoked sooner.  Performed at [facility], [address] [place], [place] [address]   Culture, blood (routine x 2)     Status: None (Preliminary result)   Collection Time: 01/31/20  9:30 PM   Specimen: BLOOD  Result Value Ref Range Status   Specimen Description BLOOD BLOOD LEFT FOREARM  Final   Special Requests   Final    BOTTLES DRAWN AEROBIC AND ANAEROBIC Blood Culture results may not be optimal due to an excessive volume of blood received in culture bottles   Culture   Final    NO GROWTH < 12 HOURS Performed at [facility], [address], [place], [place] [address]    Report Status PENDING  Incomplete  Culture, blood (routine x 2)     Status: None (Preliminary result)   Collection Time: 01/31/20  9:30 PM   Specimen: BLOOD  Result Value Ref Range Status   Specimen Description BLOOD BLOOD RIGHT FOREARM  Final   Special Requests   Final    BOTTLES DRAWN AEROBIC AND ANAEROBIC Blood Culture adequate volume   Culture   Final    NO GROWTH < 12 HOURS Performed at [facility], [address], [place], [place] [address]    Report Status PENDING  Incomplete  Culture, sputum-assessment     Status: None   Collection Time: 02/01/20  7:20 AM   Specimen: Sputum  Result Value Ref Range Status   Specimen Description SPUTUM  Final   Special Requests NONE  Final   Sputum evaluation   Final    Sputum specimen not acceptable for testing.  Please recollect.   CALLED LAURA CATES AT [phone] ON 02/01/20 TO NOTIFIY RECOLLECTION  NEEDED SNG  Performed at [facility], [address] [place] [place], [place] [address]    Report Status 02/01/2020 FINAL  Final         Radiology Studies: DG Elbow Complete  Left  Result Date: 01/31/2020 CLINICAL DATA:  Fall EXAM: LEFT ELBOW - COMPLETE 3+ VIEW COMPARISON:  None. FINDINGS: No acute fracture or dislocation.  No joint effusion. IMPRESSION: No acute osseous abnormality. Electronically Signed   By: Jeronimo Greaves M.D.   On: 01/31/2020 14:35   DG Elbow Complete Right  Result Date: 01/31/2020 CLINICAL DATA:  Status post fall. EXAM: RIGHT ELBOW - COMPLETE 3+ VIEW COMPARISON:  None. FINDINGS: There is  no evidence of fracture, dislocation, or joint effusion. There is no evidence of arthropathy or other focal bone abnormality. Soft tissues are unremarkable. IMPRESSION: No acute osseous abnormality. Electronically Signed   By: Annia Belt M.D.   On: 01/31/2020 14:46   CT Head Wo Contrast  Result Date: 01/31/2020 CLINICAL DATA:  Shortness of breath.  Cough. EXAM: CT HEAD WITHOUT CONTRAST CT CERVICAL SPINE WITHOUT CONTRAST TECHNIQUE: Multidetector CT imaging of the head and cervical spine was performed following the standard protocol without intravenous contrast. Multiplanar CT image reconstructions of the cervical spine were also generated. COMPARISON:  None. FINDINGS: CT HEAD FINDINGS Brain: Ventricles and sulci are prominent compatible with atrophy. Periventricular and subcortical white matter hypodensities compatible with chronic microvascular ischemic changes. No evidence for acute cortically based infarct, intracranial hemorrhage, mass lesion or mass-effect. 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 Vascular: Unremarkable Skull: Intact. Sinuses/Orbits: Paranasal sinuses well aerated. Mastoid air cells unremarkable. Orbits unremarkable. Other: None. CT CERVICAL SPINE FINDINGS Alignment: Normal. Skull base and vertebrae: No acute fracture. No primary bone lesion or focal pathologic process. Soft tissues and spinal canal: No prevertebral fluid or swelling. No visible canal hematoma. Disc levels: No acute fracture. C5-6 and C6-7 degenerative disc disease. Multilevel facet degenerative  changes. Upper chest: Layering left effusion. Other: None. IMPRESSION: 1. No acute intracranial process. Atrophy and chronic microvascular ischemic changes. 2. No acute cervical spine fracture. Degenerative disc disease. 3. Layering left effusion. Electronically Signed   By: Annia Belt M.D.   On: 01/31/2020 14:43   CT Cervical Spine Wo Contrast  Result Date: 01/31/2020 CLINICAL DATA:  Shortness of breath.  Cough. EXAM: CT HEAD WITHOUT CONTRAST CT CERVICAL SPINE WITHOUT CONTRAST TECHNIQUE: Multidetector CT imaging of the head and cervical spine was performed following the standard protocol without intravenous contrast. Multiplanar CT image reconstructions of the cervical spine were also generated. COMPARISON:  None. FINDINGS: CT HEAD FINDINGS Brain: Ventricles and sulci are prominent compatible with atrophy. Periventricular and subcortical white matter hypodensities compatible with chronic microvascular ischemic changes. No evidence for acute cortically based infarct, intracranial hemorrhage, mass lesion or mass-effect. 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 Vascular: Unremarkable Skull: Intact. Sinuses/Orbits: Paranasal sinuses well aerated. Mastoid air cells unremarkable. Orbits unremarkable. Other: None. CT CERVICAL SPINE FINDINGS Alignment: Normal. Skull base and vertebrae: No acute fracture. No primary bone lesion or focal pathologic process. Soft tissues and spinal canal: No prevertebral fluid or swelling. No visible canal hematoma. Disc levels: No acute fracture. C5-6 and C6-7 degenerative disc disease. Multilevel facet degenerative changes. Upper chest: Layering left effusion. Other: None. IMPRESSION: 1. No acute intracranial process. Atrophy and chronic microvascular ischemic changes. 2. No acute cervical spine fracture. Degenerative disc disease. 3. Layering left effusion. Electronically Signed   By: Annia Belt M.D.   On: 01/31/2020 14:43   US Venous Img Lower Unilateral Left (DVT)  Result Date:  01/31/2020 CLINICAL DATA:  Initial evaluation for cellulitis of lower extremity. EXAM: LEFT LOWER EXTREMITY VENOUS DOPPLER ULTRASOUND TECHNIQUE: Gray-scale sonography with graded compression, as well as color Doppler and duplex ultrasound were performed to evaluate the lower extremity deep venous systems from the level of the common femoral vein and including the common femoral, femoral, profunda femoral, popliteal and calf veins including the posterior tibial, peroneal and gastrocnemius veins when visible. The superficial great saphenous vein was also interrogated. Spectral Doppler was utilized to evaluate flow at rest and with distal augmentation maneuvers in the common femoral, femoral and popliteal veins. COMPARISON:  None. FINDINGS: Contralateral Common Femoral Vein: Respiratory  phasicity is normal and symmetric with the symptomatic side. No evidence of thrombus. Normal compressibility. Common Femoral Vein: No evidence of thrombus. Normal compressibility, respiratory phasicity and response to augmentation. Saphenofemoral Junction: No evidence of thrombus. Normal compressibility and flow on color Doppler imaging. Profunda Femoral Vein: No evidence of thrombus. Normal compressibility and flow on color Doppler imaging. Femoral Vein: No evidence of thrombus. Normal compressibility, respiratory phasicity and response to augmentation. Popliteal Vein: No evidence of thrombus. Normal compressibility, respiratory phasicity and response to augmentation. Calf Veins: No evidence of thrombus. Normal compressibility and flow on color Doppler imaging. Superficial Great Saphenous Vein: No evidence of thrombus. Normal compressibility. Venous Reflux:  None. Other Findings: Diffuse soft tissue/interstitial edema seen within the subcutaneous soft tissues of the lower leg. IMPRESSION: No evidence of deep venous thrombosis. Electronically Signed   By: Rise Mu M.D.   On: 01/31/2020 18:44   DG Chest Port 1  View  Result Date: 01/31/2020 CLINICAL DATA:  Initial evaluation for acute cough. EXAM: PORTABLE CHEST 1 VIEW COMPARISON:  Prior radiograph from 10/29/2019. FINDINGS: Cardiomegaly, stable. Mediastinal silhouette within normal limits. Aortic atherosclerosis. Lungs normally inflated. Patchy and hazy opacity seen throughout the mid and lower left lung, suspicious for possible infiltrate given provided history of cough. Increased vascular congestion seen within the left lung. Associated small layering left pleural effusion. Right lung is clear. No pneumothorax. There are acute appearing fractures of the left posterolateral fourth, fifth, sixth, seventh, eighth, and ninth ribs. These are new as compared to prior radiograph. IMPRESSION: 1. Acute appearing fractures of the left posterolateral fourth through ninth ribs, new as compared to prior radiograph from 10/29/2019. Correlation with history and physical exam recommended. 2. Patchy and hazy opacity throughout the mid and lower left lung, suspicious for infiltrate given the provided history of cough. 3. Associated small layering left pleural effusion. Electronically Signed   By: Rise Mu M.D.   On: 01/31/2020 04:29   DG HIP UNILAT WITH PELVIS 2-3 VIEWS LEFT  Result Date: 01/31/2020 CLINICAL DATA:  Fall, pain. EXAM: DG HIP (WITH OR WITHOUT PELVIS) 2-3V LEFT COMPARISON:  07/06/2016 left hip radiographs. FINDINGS: Sequela of left hip arthroplasty. Hardware components are well seated and articulate appropriately. No concerning perihardware lucency. Left hip heterotopic ossification. No soft tissue abnormalities. Limited visualization of the right hip demonstrates no acute abnormalities and mild osteoarthrosis. Sequela of left pelvic hernia repair.  Lumbar spondylosis. IMPRESSION: No acute osseous abnormality. Sequela of left hip arthroplasty without adverse features. Mild right hip osteoarthrosis. Electronically Signed   By: Stana Bunting M.D.   On:  01/31/2020 14:37        Scheduled Meds: . acetylcysteine  2 mL Nebulization BID  . acidophilus  1 capsule Oral Daily  . carbidopa-levodopa  1 tablet Oral TID  . citalopram  10 mg Oral QODAY  . fluticasone  2 spray Each Nare Daily  . guaiFENesin  600 mg Oral BID  . ipratropium-albuterol  3 mL Nebulization Q4H  . loratadine  10 mg Oral Daily  . mometasone-formoterol  2 puff Inhalation BID  . montelukast  10 mg Oral QHS  . pantoprazole  40 mg Oral Daily  . tamsulosin  0.4 mg Oral Daily   Continuous Infusions: . sodium chloride 75 mL/hr at 02/01/20 1022  . ampicillin-sulbactam (UNASYN) IV Stopped (02/01/20 1059)     LOS: 1 day    Time spent: 25 minutes    Tresa Moore, MD [facility] Pager [phone]  If 7PM-7AM,  please contact night-coverage 02/01/2020, 3:04 PM

## 2020-02-01 NOTE — Progress Notes (Signed)
Pt arrived to floor at 1845. Pt made comfortable with call light in hand, water, external urinary catheter to canister low suction and patient with no complaints.

## 2020-02-02 MED ORDER — ENOXAPARIN SODIUM 40 MG/0.4ML ~~LOC~~ SOLN
40.0000 mg | SUBCUTANEOUS | Status: DC
  Administered 2020-02-02: 22:00:00 40 mg via SUBCUTANEOUS
  Filled 2020-02-02: qty 0.4

## 2020-02-02 NOTE — Progress Notes (Signed)
Occupational Therapy Treatment Patient Details Name: Brandon Calhoun MRN: [id number] DOB: 1929/07/04 Today's Date: 02/02/2020    History of present illness Pt is an 86 y.o. male with medical history significant of hypertension, asthma, GERD, depression, anxiety, BPH, Parkinson's disease, who presents with fall, cough, shortness breath. Acute appearing fractures of the left posterolateral fourth through ninth ribs, new as compared to prior radiograph from05/13/2021.   OT comments  Pt seen for OT tx this date to f/u re: safety with ADLs/ADL mobility. Pt requires MOD A for sup to sit, demos F sitting balance EOB while OT dons his shoes (baseline MAX A needs for LB dressing). Pt requires MIN A for sit to stand with RW from elevated bed surface. Pt requires MIN A to CGA with fxl mobility with RW, with very small steps, OT cues pt to extend posture to better visualize/scan environment to recognize fall hazards. Pt requires MIN A for commode transfer to elevated surface and MOD A with peri care. Pt tolerates treatment well overall, anticipate SNF is still most appropriate d/c recommendation.   Follow Up Recommendations  SNF    Equipment Recommendations  None recommended by OT    Recommendations for Other Services      Precautions / Restrictions Precautions Precautions: Fall Precaution Comments: several falls recently, rib fx Restrictions Weight Bearing Restrictions: No       Mobility Bed Mobility Overal bed mobility: Needs Assistance Bed Mobility: Supine to Sit     Supine to sit: Mod assist;HOB elevated        Transfers Overall transfer level: Needs assistance Equipment used: Rolling walker (2 wheeled) Transfers: Sit to/from Stand Sit to Stand: Min assist;From elevated surface              Balance Overall balance assessment: Needs assistance Sitting-balance support: Bilateral upper extremity supported Sitting balance-Leahy Scale: Fair Sitting balance - Comments: pt  able to sit EOB both with and without feet supported (uses UEs to stabilize when feet cannot support d/t OT donning shoes) Postural control: Right lateral lean Standing balance support: Bilateral upper extremity supported Standing balance-Leahy Scale: Fair Standing balance comment: CGA with RW for B UE support, small steps and required increased time.                           ADL either performed or assessed with clinical judgement   ADL Overall ADL's : Needs assistance/impaired                         Toilet Transfer: Minimal assistance;Ambulation;RW;BSC;Regular Toilet;Grab bars (BSC over standard commode in restroom, uses RW with MIN A to complete fxl mobility to/from restroom)   Toileting- Architect and Hygiene: Sit to/from stand;Moderate assistance Toileting - Clothing Manipulation Details (indicate cue type and reason): MOD A for thorough completion of peri care after BM in standing, pt reports he has bidet at home to increase indep since he has trouble reaching.     Functional mobility during ADLs: Minimal assistance;Min guard;Rolling walker (cues for posture, scanning)       Vision Baseline Vision/History: Wears glasses Wears Glasses: At all times Patient Visual Report: No change from baseline     Perception     Praxis      Cognition Arousal/Alertness: Awake/alert Behavior During Therapy: WFL for tasks assessed/performed Overall Cognitive Status: Within Functional Limits for tasks assessed  Exercises Other Exercises Other Exercises: OT faciltiates ed re: different d/c possibilities and continue to fell some rehab would be best d/t pt with deconditioning. Pt in agreement.   Shoulder Instructions       General Comments      Pertinent Vitals/ Pain       Pain Assessment: 0-10 Pain Score: 0-No pain  Home Living                                           Prior Functioning/Environment              Frequency  Min 1X/week        Progress Toward Goals  OT Goals(current goals can now be found in the care plan section)  Progress towards OT goals: Progressing toward goals  Acute Rehab OT Goals Patient Stated Goal: to get his strength back OT Goal Formulation: With patient Time For Goal Achievement: 02/15/20 Potential to Achieve Goals: Good  Plan Discharge plan remains appropriate    Co-evaluation                 AM-PAC OT "6 Clicks" Daily Activity     Outcome Measure   Help from another person eating meals?: None Help from another person taking care of personal grooming?: A Little Help from another person toileting, which includes using toliet, bedpan, or urinal?: A Little Help from another person bathing (including washing, rinsing, drying)?: A Little Help from another person to put on and taking off regular upper body clothing?: A Lot Help from another person to put on and taking off regular lower body clothing?: A Lot 6 Click Score: 17    End of Session Equipment Utilized During Treatment: Rolling walker;Other (comment)  OT Visit Diagnosis: Unsteadiness on feet (R26.81);Muscle weakness (generalized) (M62.81)   Activity Tolerance Patient tolerated treatment well   Patient Left in chair;with call bell/phone within reach;with family/visitor present   Nurse Communication Mobility status;Other (comment) (notified of pt's BM during session.)        Time: 1450-1545 OT Time Calculation (min): 55 min  Charges: OT General Charges $OT Visit: 1 Visit OT Treatments $Self Care/Home Management : 23-37 mins $Therapeutic Activity: 23-37 mins  Rejeana Brock, MS, OTR/L ascom [phone] 02/02/20, 3:58 PM

## 2020-02-02 NOTE — Evaluation (Addendum)
Clinical/Bedside Swallow Evaluation Patient Details  Name: Brandon Calhoun MRN: [id number] Date of Birth: June 27, 1929  Today's Date: 02/02/2020 Time: SLP Start Time (ACUTE ONLY): 0900 SLP Stop Time (ACUTE ONLY): 1000 SLP Time Calculation (min) (ACUTE ONLY): 60 min  Past Medical History:  Past Medical History:  Diagnosis Date  . Asthma   . Edema    MILD OF FEET  . GERD (gastroesophageal reflux disease)   . Hypertension   . Mood disorder (HCC)    chronic anxiety   Past Surgical History:  Past Surgical History:  Procedure Laterality Date  . CATARACT EXTRACTION W/PHACO Left 02/16/2016   Procedure: CATARACT EXTRACTION PHACO AND INTRAOCULAR LENS PLACEMENT (IOC);  Surgeon: Nevada Crane, MD;  Location: [facility] ORS;  Service: Ophthalmology;  Laterality: Left;  [place] 1.10 AP% 10.6 CDE 7.41 Fluid Pack Lot # [id number]  . CATARACT EXTRACTION W/PHACO Right 03/08/2016   Procedure: CATARACT EXTRACTION PHACO AND INTRAOCULAR LENS PLACEMENT (IOC);  Surgeon: Nevada Crane, MD;  Location: [facility] OR[facility];  Service: Ophthalmology;  Laterality: Right;  [place] 1.14 AP% 11.6 CDE 11.80 Fluid Pack Lot # [id number]  . HERNIA REPAIR    . HIP ARTHROPLASTY Left 07/06/2016   Procedure: ARTHROPLASTY BIPOLAR HIP (HEMIARTHROPLASTY);  Surgeon: Kennedy Bucker, MD;  Location: [facility] ORS;  Service: Orthopedics;  Laterality: Left;   HPI:  Pt is a 86 y.o. male with medical history significant of hypertension, asthma, GERD, depression, anxiety, BPH, Parkinson's disease, who presents with fall, cough, shortness breath. Patient has congested cough with mucus production.  Denies chest pain, fever or chills.  Patient does not have nausea vomiting, diarrhea or abdominal pain.  No symptoms of UTI.  No unilateral numbness or tingling his activities.  No facial droop or slurred speech.  Patient has left lower leg cellulitis, heart rate still taking Augmentin. Patient had multiple falling recently. Mental status is at his baseline per Daughter.  Pt resides at an ALF. Pt had a recent Fall w/ multiple Rib fxs on Left side.    Assessment / Plan / Recommendation Clinical Impression  Pt appears to present w/ adequate oropharyngeal phase swallow function w/ No oropharyngeal phase dysphagia noted, No neuromuscular deficits noted.Pt consumed po trials w/ No overt, clinical s/s of aspiration during po trials. Pt appears at reduced risk for aspiration when following general aspiration precautions. During po trials, pt consumed all consistencies w/ no overt coughing, decline in vocal quality, or change in respiratory presentation during/post trials. Thin liquids were consumed via both Cup and Straw. Education was given on the mechanics of using both; the precautions of using straws when drinking. Oral phase appeared [facility] w/ timely bolus management, mastication, and control of bolus propulsion for A-P transfer for swallowing. A mild cough x1 noted much after po trials via Straw; unsure if related to immediate swallowing of boluses. Pt had recently had a breathing tx per Dtr and had been coughing(dry) in order to expectorate phlegm. No further coughing noted w/ trials of thin liquids via Cup. Oral clearing achieved w/ all trial consistencies. OM Exam appeared [facility] w/ no unilateral weakness noted. Speech Clear. Pt fed self w/ setup support. Recommend continue a Regular consistency diet w/ well-Cut meats, moistened foods; Thin liquidsVIA CUP is recommended. Recommend general aspiration precautions, GERD precautions, Pills WHOLE in Puree for safer, easier swallowing as pt described Larger pills causing difficulty to swallow. [facility] Education given on general swallowing, swallowing Pills in a Puree; food consistencies and easy to eat options; general aspiration precautions. NSG to reconsult  if any new needs arise. NSG agreed. Both pt and Daughter agreed w/ information provided; handouts given on recommendations. Also discussed pt's need to use a lingual sweep/swallow to  clear any collecting saliva pooling orally; suspect impacted by Parkinson's Disease. Pt followed through w/ instructions appropriately.  SLP Visit Diagnosis: Dysphagia, unspecified (R13.10)    Aspiration Risk  Mild aspiration risk;Risk for inadequate nutrition/hydration (reduced following general precautions)    Diet Recommendation  Regular diet w/ meats Cut, gravies added to moisten well; thin liquids via Cup. General aspiration precautions; GERD precautions. Tray setup as needed.   Medication Administration: Whole meds with puree (for safer, easier swallowing)    Other  Recommendations Recommended Consults:  (Dietician support) Oral Care Recommendations: Oral care BID;Oral care before and after PO;Patient independent with oral care Other Recommendations:  (n/a)   Follow up Recommendations None      Frequency and Duration  (n/a)   (n/a)       Prognosis Prognosis for Safe Diet Advancement: Good Barriers to Reach Goals:  (n/a)      Swallow Study   General Date of Onset: 01/31/20 HPI: Pt is a 86 y.o. male with medical history significant of hypertension, asthma, GERD, depression, anxiety, BPH, Parkinson's disease, who presents with fall, cough, shortness breath. Patient has congested cough with mucus production.  Denies chest pain, fever or chills.  Patient does not have nausea vomiting, diarrhea or abdominal pain.  No symptoms of UTI.  No unilateral numbness or tingling his activities.  No facial droop or slurred speech.  Patient has left lower leg cellulitis, heart rate still taking Augmentin. Patient had multiple falling recently. Mental status is at his baseline per Daughter. Pt resides at an ALF. Pt had a recent Fall w/ multiple Rib fxs on Left side.  Type of Study: Bedside Swallow Evaluation Previous Swallow Assessment: none Diet Prior to this Study: Regular;Thin liquids Temperature Spikes Noted: No (wbc 9.2) Respiratory Status: Room air History of Recent Intubation:  No Behavior/Cognition: Alert;Cooperative;Pleasant mood (min HOH) Oral Cavity Assessment: Within Functional Limits Oral Care Completed by SLP: Yes Oral Cavity - Dentition: Adequate natural dentition Vision: Functional for self-feeding Self-Feeding Abilities: Able to feed self;Needs set up (min) Patient Positioning: Upright in bed (needed min positioning support) Baseline Vocal Quality: Normal;Low vocal intensity Volitional Cough: Strong;Congested (slight) Volitional Swallow: Able to elicit    Oral/Motor/Sensory Function Overall Oral Motor/Sensory Function: Within functional limits   Ice Chips Ice chips: Within functional limits Presentation: Spoon (fed; 2 trials)   Thin Liquid Thin Liquid: Within functional limits Presentation: Cup;Self Fed;Straw (~3 ozs via cup; multiple sips via straw) Other Comments: mild cough x1 much after po trials via straw; unsure if related to immediate swallowing of boluses. Pt had recently had a breathing tx per Dtr.     Nectar Thick Nectar Thick Liquid: Not tested   Honey Thick Honey Thick Liquid: Not tested   Puree Puree: Within functional limits Presentation: Self Fed;Spoon (supported; 6+ trials)   Solid     Solid: Within functional limits Presentation: Self Fed (5 trials)       Jerilynn Som, MS, CCC-SLP Reyansh Kushnir 02/02/2020,3:00 PM

## 2020-02-02 NOTE — Consult Note (Addendum)
WOC Nurse wound re-consult Consult was performed yesterday for left elbow.  Requested today to assess left leg.   Wound type: Left anterior and posterior lower calf with generalized edema and erythremia related to cellulitis. Affected area is approx 10X10cm. Family member at the bedside to assess location and discuss plan of care; she states the site previously had mod amt yellow drainage from a partial thickness wound.  Both of these issues have resolved at this time.   Dressing procedure/placement/frequency: Topical treatment orders provided for bedside nurses to perform as follows to protect from further injury: Foam dressing to left leg, change Q 3 days or PRN soiling. Please re-consult if further assistance is needed.  Thank-you,  Cammie Mcgee MSN, RN, CWOCN, [place], CNS [phone]

## 2020-02-02 NOTE — NC FL2 (Signed)
MEDICAID FL2 LEVEL OF CARE SCREENING TOOL     IDENTIFICATION  Patient Name: Brandon Calhoun Birthdate: 05-12-1930 Sex: male Admission Date (Current Location): 01/31/2020  [place] and [place] Number:  Chiropodist and Address:  [facility], [address], [place], [place] [address]      Provider Number: [id number]  Attending Physician Name and Address:  Tresa Moore, MD  Relative Name and Phone Number:       Current Level of Care: Hospital Recommended Level of Care: Skilled Nursing Facility Prior Approval Number:    Date Approved/Denied:   PASRR Number: [id number]  Discharge Plan: SNF    Current Diagnoses: Patient Active Problem List   Diagnosis Date Noted  . HTN (hypertension) 01/31/2020  . Depression 01/31/2020  . Fall 01/31/2020  . Multiple rib fractures 01/31/2020  . Cellulitis of left lower leg 01/31/2020  . Aspiration pneumonia (HCC) 01/31/2020  . Asthma 01/31/2020  . Hyponatremia 10/29/2019  . Leukocytosis 10/29/2019  . Accidental fall 10/29/2019  . Multiple fractures of ribs, right side, initial encounter for closed fracture 10/29/2019  . Generalized weakness 10/29/2019  . Venous insufficiency of both lower extremities 07/03/2018  . Parkinson's disease (HCC) 01/03/2017  . 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 GERD (gastroesophageal reflux disease)   . Asthma exacerbation   . Mood disorder (HCC)     Orientation RESPIRATION BLADDER Height & Weight     Self, Time, Situation, Place  Normal Incontinent, External catheter Weight: 145 lb (65.8 kg) Height:  5\' 9"  (175.3 cm)  BEHAVIORAL SYMPTOMS/MOOD NEUROLOGICAL BOWEL NUTRITION STATUS   (None)  (Parkinson's) Continent Diet (Heart healthy. Fluid restriction 1200 mL.)  AMBULATORY STATUS COMMUNICATION OF NEEDS Skin   Limited Assist Verbally Skin abrasions                       Personal Care Assistance Level of Assistance   Bathing, Feeding, Dressing Bathing Assistance: Limited assistance Feeding assistance: Limited assistance Dressing Assistance: Limited assistance     Functional Limitations Info  Sight, Hearing, Speech Sight Info: Adequate Hearing Info: Adequate Speech Info: Adequate    SPECIAL CARE FACTORS FREQUENCY  PT (By licensed PT), OT (By licensed OT)     PT Frequency: 5 x week OT Frequency: 5 x week            Contractures Contractures Info: Not present    Additional Factors Info  Code Status, Allergies Code Status Info: Full code Allergies Info: NKDA           Current Medications (02/02/2020):  This is the current hospital active medication list Current Facility-Administered Medications  Medication Dose Route Frequency Provider Last Rate Last Admin  . acetaminophen (TYLENOL) tablet 650 mg  650 mg Oral Q6H PRN 02/04/2020, MD      . acetylcysteine (MUCOMYST) 20 % nebulizer / oral solution 2 mL  2 mL Nebulization BID Lorretta Harp B, MD   2 mL at 02/02/20 0906  . acidophilus (RISAQUAD) capsule 1 capsule  1 capsule Oral Daily 02/04/20, MD   1 capsule at 02/02/20 0826  . albuterol (PROVENTIL) (2.5 MG/3ML) 0.083% nebulizer solution 2.5 mg  2.5 mg Nebulization Q4H PRN 02/04/20, MD      . alum & mag hydroxide-simeth (MAALOX/MYLANTA) 200-200-20 MG/5ML suspension 30 mL  30 mL Oral Q6H PRN 12-25-2000, MD      . Ampicillin-Sulbactam (UNASYN) 3 g in sodium chloride 0.9 % 100  mL IVPB  3 g Intravenous Q6H Lolita Patella B, MD 200 mL/hr at 02/02/20 0833 3 g at 02/02/20 0833  . carbidopa-levodopa (SINEMET IR) 25-100 MG per tablet immediate release 1 tablet  1 tablet Oral TID Lorretta Harp, MD   1 tablet at 02/02/20 0826  . citalopram (CELEXA) tablet 10 mg  10 mg Oral Raeanne Gathers, MD   10 mg at 02/01/20 0951  . enoxaparin (LOVENOX) injection 40 mg  40 mg Subcutaneous Q24H Sreenath, Sudheer B, MD      . fluticasone (FLONASE) 50 MCG/ACT nasal spray 2 spray  2 spray Each Nare Daily Lorretta Harp, MD      . guaiFENesin (MUCINEX) 12 hr tablet 600 mg  600 mg Oral BID Georgeann Oppenheim, Sudheer B, MD   600 mg at 02/02/20 0827  . hydrALAZINE (APRESOLINE) injection 5 mg  5 mg Intravenous Q2H PRN Lorretta Harp, MD      . ipratropium-albuterol (DUONEB) 0.5-2.5 (3) MG/3ML nebulizer solution 3 mL  3 mL Nebulization Q4H Lorretta Harp, MD   3 mL at 02/02/20 0906  . loratadine (CLARITIN) tablet 10 mg  10 mg Oral Daily Lorretta Harp, MD   10 mg at 02/02/20 0827  . magnesium hydroxide (MILK OF MAGNESIA) suspension 30 mL  30 mL Oral Daily PRN Lorretta Harp, MD      . mometasone-formoterol [facility]) 200-5 MCG/ACT inhaler 2 puff  2 puff Inhalation BID Lorretta Harp, MD   2 puff at 02/02/20 0824  . montelukast (SINGULAIR) tablet 10 mg  10 mg Oral QHS Lorretta Harp, MD   10 mg at 02/01/20 2153  . ondansetron (ZOFRAN) injection 4 mg  4 mg Intravenous Q8H PRN Lorretta Harp, MD      . oxyCODONE-acetaminophen (PERCOCET/ROXICET) 5-325 MG per tablet 1 tablet  1 tablet Oral Q4H PRN Lorretta Harp, MD   1 tablet at 02/01/20 0207  . pantoprazole (PROTONIX) EC tablet 40 mg  40 mg Oral Daily Lorretta Harp, MD   40 mg at 02/02/20 5176  . tamsulosin (FLOMAX) capsule 0.4 mg  0.4 mg Oral Daily Lorretta Harp, MD   0.4 mg at 02/02/20 1607     Discharge Medications: Please see discharge summary for a list of discharge medications.  Relevant Imaging Results:  Relevant Lab Results:   Additional Information SS#: [id number]  Margarito Liner, LCSW

## 2020-02-02 NOTE — Progress Notes (Signed)
PROGRESS NOTE    Br[place]  LPF:[id number] DOB: [birth year] DOA: 01/31/2020 PCP: [name], MD   Brief Narrative:  HPI: [place] is a 86 y.o. male with medical history significant of hypertension, asthma, GERD, depression, anxiety, BPH, Parkinson's disease, who presents with fall, cough, shortness breath.  Patient has cough and shortness of breath in past several days, which has been progressively worsening.  Patient has congested cough with mucus production.  Denies chest pain, fever or chills.  Patient does not have nausea vomiting, diarrhea or abdominal pain.  No symptoms of UTI.  No unilateral numbness or tingling his activities.  No facial droop or slurred speech.  Patient has left lower leg cellulitis, heart rate still taking Augmentin. Patient had multiple falling recently. Patient has small skin tear in her elbows, with bruise in left hip and head.  Patient is alert oriented x3.  Per her daughter, patient mental status is at his normal baseline.  8/16: Patient seen and examined.  Daughter at bedside.  Patient endorsing some difficulty bringing up his secretions.  Complains of pain in the left elbow due to skin tear  8/17: Patient seen and examined.  Daughter at bedside.  More awake this morning.  No oxygen requirement.  Seen by speech therapy.  Cleared for regular diet.  Sodium remains low but improving over interval.   Assessment & Plan:   Principal Problem:   Aspiration pneumonia (HCC) Active Problems:   GERD (gastroesophageal reflux disease)   Parkinson's disease (HCC)   BPH with obstruction/lower urinary tract symptoms   Hyponatremia   HTN (hypertension)   Depression   Fall   Multiple rib fractures   Cellulitis of left lower leg   Asthma   Possible aspiration pneumonia (HCC)  CXR showed patchy and hazy opacity throughout the mid and lower left lung. Given his hx of Parkinson's disease, patient likely has aspiration pneumonia.  No fever or  leukocytosis, clinically not septic.  No oxygen desaturation. Plan: Continue Unasyn, can likely de-escalate to orals to complete 5-day course Pneumonitis versus atelectasis also possible Mucinex twice daily Bronchodilator therapy Twice daily Mucomyst Follow-up urinary antigens Follow blood and sputum cultures no growth to date Ensure patient get Parkinson's medications  Asthma  No wheezing on auscultation -Bronchodilators -Singulair  GERD (gastroesophageal reflux disease) -Protonix  Parkinson's disease (HCC) -Continue Sinemet  BPH with obstruction/lower urinary tract symptoms -Flomax  Hyponatremia Most likely due to poor oral intake, dehydration and diuretic use Improving over interval Plan: - Fluid restriction - hold spironolactone - IVF: 250 cc of NS in ED, NS at 75 mL/h afterwards.  Will DC IV fluids for now as patient is tolerating p.o. - f/u by BMP q8h - avoid over correction too fast due to risk of central pontine myelinolysis -Patient appears chronically hyponatremic.  I would say if sodium level approaches 129 this is near baseline and patient will be stable for discharge  HTN (hypertension) -IV hydralazine. -Hold spironolactone due to hyponatremia  Depression -Celexa  Fall -PT/OT, recommend SNF  Multiple rib fractures -As needed Percocet and Tylenol  Cellulitis of left lower leg: Unasyn IV as above Does not appear acute Cellulitic changes improving over interval    DVT prophylaxis: Lovenox Code Status: Full Family Communication: Daughter at bedside Disposition Plan: Status is: Inpatient  Remains inpatient appropriate because:Inpatient level of care appropriate due to severity of illness   Dispo: The patient is from: Home  Anticipated d/c is to: Home              Anticipated d/c date is: 2 days              Patient currently is not medically stable to d/c.  Receiving IV antibiotics for a presumed aspiration pneumonia  and known cellulitis.  Can likely transition to oral antibiotic within 24 hours.  If sodium continues to improve and cellulitic changes improving anticipate discharge on 02/03/2020      Consultants:   None  Procedures:   None  Antimicrobials:   Unasyn   Subjective: Seen and examined.  Cough improving.  Pain in left elbow improving  Objective: Vitals:   02/02/20 0038 02/02/20 0509 02/02/20 0822 02/02/20 0900  BP: (!) 151/75 129/61 (!) 108/41   Pulse: 73 72 74   Resp: 18 16 18    Temp: 98.5 F (36.9 C) (!) 97.3 F (36.3 C) 98.3 F (36.8 C)   TempSrc: Oral Oral Oral   SpO2: 97% 96% 94% 98%  Weight:      Height:        Intake/Output Summary (Last 24 hours) at 02/02/2020 1024 Last data filed at 02/02/2020 0524 Gross per 24 hour  Intake 101.01 ml  Output 1400 ml  Net -1298.99 ml   Filed Weights   01/31/20 0402  Weight: 65.8 kg    Examination:  General: No apparent distress, patient appears well HEENT: Normocephalic, atraumatic Neck, supple, trachea midline, no tenderness Heart: Regular rate and rhythm, S1/S2 normal, no murmurs Lungs: Clear to auscultation bilaterally, no adventitious sounds, normal work of breathing Abdomen: Soft, nontender, nondistended, positive bowel sounds Extremities: Normal, atraumatic, no clubbing or cyanosis, normal muscle tone Skin: Skin tear left elbow, left lower extremity cellulitic changes, improving over interval Neurologic: Cranial nerves grossly intact, sensation intact, alert and oriented x3 Psychiatric: Flattened affect     Data Reviewed: I have personally reviewed following labs and imaging studies  CBC: Recent Labs  Lab 01/31/20 0952 02/01/20 0629  WBC 9.0 9.2  NEUTROABS 6.6  --   HGB 11.7* 11.3*  HCT 31.4* 31.6*  MCV 84.6 86.8  PLT 352 348   Basic Metabolic Panel: Recent Labs  Lab 01/31/20 0952 02/01/20 0140 02/01/20 1556 02/01/20 1913  NA 120* 125* 126* 127*  K 3.8 3.7 3.8 4.0  CL 88* 94* 95* 96*    CO2 21* 23 22 22   GLUCOSE 106* 94 108* 121*  BUN 12 12 14 16   CREATININE 0.43* 0.45* 0.51* 0.49*  CALCIUM 7.8* 7.8* 7.6* 7.7*   GFR: Estimated Creatinine Clearance: 59.4 mL/min (A) (by C-G formula based on SCr of 0.49 mg/dL (L)). Liver Function Tests: Recent Labs  Lab 01/31/20 0952  AST 23  ALT 20  ALKPHOS 123  BILITOT 1.1  PROT 5.6*  ALBUMIN 2.9*   No results for input(s): LIPASE, AMYLASE in the last 168 hours. No results for input(s): AMMONIA in the last 168 hours. Coagulation Profile: No results for input(s): INR, PROTIME in the last 168 hours. Cardiac Enzymes: Recent Labs  Lab 01/31/20 1343  CKTOTAL 95   BNP (last 3 results) No results for input(s): PROBNP in the last 8760 hours. HbA1C: No results for input(s): HGBA1C in the last 72 hours. CBG: No results for input(s): GLUCAP in the last 168 hours. Lipid Profile: No results for input(s): CHOL, HDL, LDLCALC, TRIG, CHOLHDL, LDLDIRECT in the last 72 hours. Thyroid Function Tests: Recent Labs    02/01/20 0629  TSH 1.957  Anemia Panel: No results for input(s): VITAMINB12, FOLATE, FERRITIN, TIBC, IRON, RETICCTPCT in the last 72 hours. Sepsis Labs: Recent Labs  Lab 01/31/20 1343  LATICACIDVEN 1.1    Recent Results (from the past 240 hour(s))  SARS Coronavirus 2 by RT PCR (hospital order, performed in [facility] hospital lab) Nasopharyngeal Nasopharyngeal Swab     Status: None   Collection Time: 01/31/20  6:11 PM   Specimen: Nasopharyngeal Swab  Result Value Ref Range Status   SARS Coronavirus 2 NEGATIVE NEGATIVE Final    Comment: (NOTE) SARS-CoV-2 target nucleic acids are NOT DETECTED.  The SARS-CoV-2 RNA is generally detectable in upper and lower respiratory specimens during the acute phase of infection. The lowest concentration of SARS-CoV-2 viral copies this assay can detect is 250 copies / mL. A negative result does not preclude SARS-CoV-2 infection and should not be used as the sole basis for  treatment or other patient management decisions.  A negative result may occur with improper specimen collection / handling, submission of specimen other than nasopharyngeal swab, presence of viral mutation(s) within the areas targeted by this assay, and inadequate number of viral copies (<250 copies / mL). A negative result must be combined with clinical observations, patient history, and epidemiological information.  Fact Sheet for Patients:   BoilerBrush.com.cy  Fact Sheet for Healthcare Providers: https://pope.com/  This test is not yet approved or  cleared by the [place] FDA and has been authorized for detection and/or diagnosis of SARS-CoV-2 by FDA under an Emergency Use Authorization (EUA).  This EUA will remain in effect (meaning this test can be used) for the duration of the COVID-19 declaration under Section 564(b)(1) of the Act, 21 U.S.C. section 360bbb-3(b)(1), unless the authorization is terminated or revoked sooner.  Performed at [facility], [address] [place], [place] [address]   Culture, blood (routine x 2)     Status: None (Preliminary result)   Collection Time: 01/31/20  9:30 PM   Specimen: BLOOD  Result Value Ref Range Status   Specimen Description BLOOD BLOOD LEFT FOREARM  Final   Special Requests   Final    BOTTLES DRAWN AEROBIC AND ANAEROBIC Blood Culture results may not be optimal due to an excessive volume of blood received in culture bottles   Culture   Final    NO GROWTH 2 DAYS Performed at [facility], [address] [place], [place] [address]    Report Status PENDING  Incomplete  Culture, blood (routine x 2)     Status: None (Preliminary result)   Collection Time: 01/31/20  9:30 PM   Specimen: BLOOD  Result Value Ref Range Status   Specimen Description BLOOD BLOOD RIGHT FOREARM  Final   Special Requests   Final    BOTTLES DRAWN AEROBIC AND ANAEROBIC Blood Culture  adequate volume   Culture   Final    NO GROWTH 2 DAYS Performed at [facility], [address] [place], [place] [address]    Report Status PENDING  Incomplete  Culture, sputum-assessment     Status: None   Collection Time: 02/01/20  7:20 AM   Specimen: Sputum  Result Value Ref Range Status   Specimen Description SPUTUM  Final   Special Requests NONE  Final   Sputum evaluation   Final    Sputum specimen not acceptable for testing.  Please recollect.   CALLED LAURA CATES AT 0835 ON 02/01/20 TO NOTIFIY RECOLLECTION  NEEDED SNG  Performed at [facility], [phone]  [address] [place], [place] [address]    Report Status 02/01/2020 FINAL  Final         Radiology Studies: DG Elbow Complete Left  Result Date: 01/31/2020 CLINICAL DATA:  Fall EXAM: LEFT ELBOW - COMPLETE 3+ VIEW COMPARISON:  None. FINDINGS: No acute fracture or dislocation.  No joint effusion. IMPRESSION: No acute osseous abnormality. Electronically Signed   By: Jeronimo GreavesKyle  Talbot M.D.   On: 01/31/2020 14:35   DG Elbow Complete Right  Result Date: 01/31/2020 CLINICAL DATA:  Status post fall. EXAM: RIGHT ELBOW - COMPLETE 3+ VIEW COMPARISON:  None. FINDINGS: There is no evidence of fracture, dislocation, or joint effusion. There is no evidence of arthropathy or other focal bone abnormality. Soft tissues are unremarkable. IMPRESSION: No acute osseous abnormality. Electronically Signed   By: Annia Beltrew  Davis M.D.   On: 01/31/2020 14:46   CT Head Wo Contrast  Result Date: 01/31/2020 CLINICAL DATA:  Shortness of breath.  Cough. EXAM: CT HEAD WITHOUT CONTRAST CT CERVICAL SPINE WITHOUT CONTRAST TECHNIQUE: Multidetector CT imaging of the head and cervical spine was performed following the standard protocol without intravenous contrast. Multiplanar CT image reconstructions of the cervical spine were also generated. COMPARISON:  None. FINDINGS: CT HEAD FINDINGS Brain: Ventricles and sulci are prominent compatible with atrophy.  Periventricular and subcortical white matter hypodensities compatible with chronic microvascular ischemic changes. No evidence for acute cortically based infarct, intracranial hemorrhage, mass lesion or mass-effect. 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 Vascular: Unremarkable Skull: Intact. Sinuses/Orbits: Paranasal sinuses well aerated. Mastoid air cells unremarkable. Orbits unremarkable. Other: None. CT CERVICAL SPINE FINDINGS Alignment: Normal. Skull base and vertebrae: No acute fracture. No primary bone lesion or focal pathologic process. Soft tissues and spinal canal: No prevertebral fluid or swelling. No visible canal hematoma. Disc levels: No acute fracture. C5-6 and C6-7 degenerative disc disease. Multilevel facet degenerative changes. Upper chest: Layering left effusion. Other: None. IMPRESSION: 1. No acute intracranial process. Atrophy and chronic microvascular ischemic changes. 2. No acute cervical spine fracture. Degenerative disc disease. 3. Layering left effusion. Electronically Signed   By: Annia Beltrew  Davis M.D.   On: 01/31/2020 14:43   CT Cervical Spine Wo Contrast  Result Date: 01/31/2020 CLINICAL DATA:  Shortness of breath.  Cough. EXAM: CT HEAD WITHOUT CONTRAST CT CERVICAL SPINE WITHOUT CONTRAST TECHNIQUE: Multidetector CT imaging of the head and cervical spine was performed following the standard protocol without intravenous contrast. Multiplanar CT image reconstructions of the cervical spine were also generated. COMPARISON:  None. FINDINGS: CT HEAD FINDINGS Brain: Ventricles and sulci are prominent compatible with atrophy. Periventricular and subcortical white matter hypodensities compatible with chronic microvascular ischemic changes. No evidence for acute cortically based infarct, intracranial hemorrhage, mass lesion or mass-effect. 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 Vascular: Unremarkable Skull: Intact. Sinuses/Orbits: Paranasal sinuses well aerated. Mastoid air cells unremarkable. Orbits unremarkable. Other:  None. CT CERVICAL SPINE FINDINGS Alignment: Normal. Skull base and vertebrae: No acute fracture. No primary bone lesion or focal pathologic process. Soft tissues and spinal canal: No prevertebral fluid or swelling. No visible canal hematoma. Disc levels: No acute fracture. C5-6 and C6-7 degenerative disc disease. Multilevel facet degenerative changes. Upper chest: Layering left effusion. Other: None. IMPRESSION: 1. No acute intracranial process. Atrophy and chronic microvascular ischemic changes. 2. No acute cervical spine fracture. Degenerative disc disease. 3. Layering left effusion. Electronically Signed   By: Annia Beltrew  Davis M.D.   On: 01/31/2020 14:43   US Venous Img Lower Unilateral Left (DVT)  Result Date: 01/31/2020 CLINICAL DATA:  Initial evaluation  for cellulitis of lower extremity. EXAM: LEFT LOWER EXTREMITY VENOUS DOPPLER ULTRASOUND TECHNIQUE: Gray-scale sonography with graded compression, as well as color Doppler and duplex ultrasound were performed to evaluate the lower extremity deep venous systems from the level of the common femoral vein and including the common femoral, femoral, profunda femoral, popliteal and calf veins including the posterior tibial, peroneal and gastrocnemius veins when visible. The superficial great saphenous vein was also interrogated. Spectral Doppler was utilized to evaluate flow at rest and with distal augmentation maneuvers in the common femoral, femoral and popliteal veins. COMPARISON:  None. FINDINGS: Contralateral Common Femoral Vein: Respiratory phasicity is normal and symmetric with the symptomatic side. No evidence of thrombus. Normal compressibility. Common Femoral Vein: No evidence of thrombus. Normal compressibility, respiratory phasicity and response to augmentation. Saphenofemoral Junction: No evidence of thrombus. Normal compressibility and flow on color Doppler imaging. Profunda Femoral Vein: No evidence of thrombus. Normal compressibility and flow on color  Doppler imaging. Femoral Vein: No evidence of thrombus. Normal compressibility, respiratory phasicity and response to augmentation. Popliteal Vein: No evidence of thrombus. Normal compressibility, respiratory phasicity and response to augmentation. Calf Veins: No evidence of thrombus. Normal compressibility and flow on color Doppler imaging. Superficial Great Saphenous Vein: No evidence of thrombus. Normal compressibility. Venous Reflux:  None. Other Findings: Diffuse soft tissue/interstitial edema seen within the subcutaneous soft tissues of the lower leg. IMPRESSION: No evidence of deep venous thrombosis. Electronically Signed   By: Rise Mu M.D.   On: 01/31/2020 18:44   DG HIP UNILAT WITH PELVIS 2-3 VIEWS LEFT  Result Date: 01/31/2020 CLINICAL DATA:  Fall, pain. EXAM: DG HIP (WITH OR WITHOUT PELVIS) 2-3V LEFT COMPARISON:  07/06/2016 left hip radiographs. FINDINGS: Sequela of left hip arthroplasty. Hardware components are well seated and articulate appropriately. No concerning perihardware lucency. Left hip heterotopic ossification. No soft tissue abnormalities. Limited visualization of the right hip demonstrates no acute abnormalities and mild osteoarthrosis. Sequela of left pelvic hernia repair.  Lumbar spondylosis. IMPRESSION: No acute osseous abnormality. Sequela of left hip arthroplasty without adverse features. Mild right hip osteoarthrosis. Electronically Signed   By: Stana Bunting M.D.   On: 01/31/2020 14:37        Scheduled Meds: . acetylcysteine  2 mL Nebulization BID  . acidophilus  1 capsule Oral Daily  . carbidopa-levodopa  1 tablet Oral TID  . citalopram  10 mg Oral QODAY  . enoxaparin (LOVENOX) injection  40 mg Subcutaneous Q24H  . fluticasone  2 spray Each Nare Daily  . guaiFENesin  600 mg Oral BID  . ipratropium-albuterol  3 mL Nebulization Q4H  . loratadine  10 mg Oral Daily  . mometasone-formoterol  2 puff Inhalation BID  . montelukast  10 mg Oral QHS  .  pantoprazole  40 mg Oral Daily  . tamsulosin  0.4 mg Oral Daily   Continuous Infusions: . ampicillin-sulbactam (UNASYN) IV 3 g (02/02/20 1610)     LOS: 2 days    Time spent: 25 minutes    Tresa Moore, MD [facility] Pager [phone]  If 7PM-7AM, please contact night-coverage 02/02/2020, 10:24 AM

## 2020-02-02 NOTE — TOC Initial Note (Addendum)
Transition of Care [facility]) - Initial/Assessment Note    Patient Details  Name: [place] MRN: [id number] Date of Birth: Feb 16, 1930  [facility] CM/SW Contact:    Margarito Liner, LCSW Phone Number: 02/02/2020, 11:25 AM  Clinical Narrative: Patient talking with speech therapist so daughter came out into hallway to talk with CSW. She confirmed he is from [facility] and they are agreeable to going to the SNF side at discharge. Daughter said MD told them this morning that he would likely discharge tomorrow. CSW made [facility] admissions coordinator aware and faxed clinicals to [facility] for insurance authorization review.                 3:20 pm: Insurance authorization approved. [employer] reference number is [id number]. Start date tomorrow.   Expected Discharge Plan: Skilled Nursing Facility Barriers to Discharge: English as a second language teacher, Continued Medical Work up   Patient Goals and CMS Choice     Choice offered to / list presented to : NA  Expected Discharge Plan and Services Expected Discharge Plan: Skilled Nursing Facility     Post Acute Care Choice: Skilled Nursing Facility Living arrangements for the past 2 months: Assisted Living Facility                                      Prior Living Arrangements/Services Living arrangements for the past 2 months: Assisted Living Facility Lives with:: Facility Resident Patient language and need for interpreter reviewed:: Yes Do you feel safe going back to the place where you live?: Yes      Need for Family Participation in Patient Care: Yes (Comment) Care giver support system in place?: Yes (comment)   Criminal Activity/Legal Involvement Pertinent to Current Situation/Hospitalization: No - Comment as needed  Activities of Daily Living      Permission Sought/Granted Permission sought to share information with : Facility Medical sales representative, Family Supports    Share Information with NAME:  Angelique Holm  Permission granted to share info w AGENCY: [facility]  Permission granted to share info w Relationship: Daughter  Permission granted to share info w Contact Information: [phone]  Emotional Assessment   Attitude/Demeanor/Rapport: Unable to Assess Affect (typically observed): Unable to Assess Orientation: : Oriented to Self, Oriented to Place, Oriented to  Time, Oriented to Situation Alcohol / Substance Use: Not Applicable Psych Involvement: No (comment)  Admission diagnosis:  Cough [R05] Hyponatremia [E87.1] Bruising [T14.8XXA] Left hip pain [M25.552] Asthma exacerbation [J45.901] Multiple skin tears [T14.8XXA] Cellulitis of lower extremity [L03.119] Closed fracture of multiple ribs of left side, initial encounter [S22.42XA] Dyspnea, unspecified type [R06.00] Patient Active Problem List   Diagnosis Date Noted  . HTN (hypertension) 01/31/2020  . Depression 01/31/2020  . Fall 01/31/2020  . Multiple rib fractures 01/31/2020  . Cellulitis of left lower leg 01/31/2020  . Aspiration pneumonia (HCC) 01/31/2020  . Asthma 01/31/2020  . Hyponatremia 10/29/2019  . Leukocytosis 10/29/2019  . Accidental fall 10/29/2019  . Multiple fractures of ribs, right side, initial encounter for closed fracture 10/29/2019  . Generalized weakness 10/29/2019  . Venous insufficiency of both lower extremities 07/03/2018  . Parkinson's disease (HCC) 01/03/2017  . 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 GERD (gastroesophageal reflux disease)   . Asthma exacerbation   . Mood disorder (HCC)    PCP:  [name], MD Pharmacy:   [facility] -  [place], [place] - [address] [place] [address] Phone: [phone] Fax: [phone]  [facility] - [place], [place] - [address] [place] [address] Phone: [phone] Fax: [phone]     Social Determinants of Health  (SDOH) Interventions    Readmission Risk Interventions No flowsheet data found.

## 2020-02-03 DIAGNOSIS — E871 Hypo-osmolality and hyponatremia: Secondary | ICD-10-CM

## 2020-02-03 LAB — CBC WITH DIFFERENTIAL/PLATELET
Abs Immature Granulocytes: 0.05 10*3/uL (ref 0.00–0.07)
Basophils Absolute: 0 10*3/uL (ref 0.0–0.1)
Basophils Relative: 1 %
Eosinophils Absolute: 0.2 10*3/uL (ref 0.0–0.5)
Eosinophils Relative: 3 %
HCT: 29.7 % — ABNORMAL LOW (ref 39.0–52.0)
Hemoglobin: 10.4 g/dL — ABNORMAL LOW (ref 13.0–17.0)
Immature Granulocytes: 1 %
Lymphocytes Relative: 25 %
Lymphs Abs: 1.9 10*3/uL (ref 0.7–4.0)
MCH: 31.1 pg (ref 26.0–34.0)
MCHC: 35 g/dL (ref 30.0–36.0)
MCV: 88.9 fL (ref 80.0–100.0)
Monocytes Absolute: 0.7 10*3/uL (ref 0.1–1.0)
Monocytes Relative: 9 %
Neutro Abs: 4.8 10*3/uL (ref 1.7–7.7)
Neutrophils Relative %: 61 %
Platelets: 370 10*3/uL (ref 150–400)
RBC: 3.34 MIL/uL — ABNORMAL LOW (ref 4.22–5.81)
RDW: 14.5 % (ref 11.5–15.5)
WBC: 7.7 10*3/uL (ref 4.0–10.5)
nRBC: 0 % (ref 0.0–0.2)

## 2020-02-03 LAB — BASIC METABOLIC PANEL
Anion gap: 7 (ref 5–15)
BUN: 16 mg/dL (ref 8–23)
CO2: 25 mmol/L (ref 22–32)
Calcium: 7.6 mg/dL — ABNORMAL LOW (ref 8.9–10.3)
Chloride: 98 mmol/L (ref 98–111)
Creatinine, Ser: 0.48 mg/dL — ABNORMAL LOW (ref 0.61–1.24)
GFR calc Af Amer: >60 mL/min (ref >60)
GFR calc non Af Amer: >60 mL/min (ref >60)
Glucose, Bld: 94 mg/dL (ref 70–99)
Potassium: 3.8 mmol/L (ref 3.5–5.1)
Sodium: 130 mmol/L — ABNORMAL LOW (ref 135–145)

## 2020-02-03 LAB — LEGIONELLA PNEUMOPHILA SEROGP 1 UR AG: L. pneumophila Serogp 1 Ur Ag: NEGATIVE

## 2020-02-03 MED ORDER — IPRATROPIUM-ALBUTEROL 0.5-2.5 (3) MG/3ML IN SOLN
3.0000 mL | Freq: Four times a day (QID) | RESPIRATORY_TRACT | Status: DC
  Administered 2020-02-03: 08:00:00 3 mL via RESPIRATORY_TRACT
  Filled 2020-02-03: qty 3

## 2020-02-03 MED ORDER — CEPHALEXIN 500 MG PO CAPS: 500.0000 mg | ORAL_CAPSULE | Freq: Three times a day (TID) | ORAL | 0 refills | Status: AC

## 2020-02-03 MED ORDER — IPRATROPIUM-ALBUTEROL 0.5-2.5 (3) MG/3ML IN SOLN: 3.0000 mL | Freq: Three times a day (TID) | RESPIRATORY_TRACT | Status: DC

## 2020-02-03 NOTE — TOC Transition Note (Signed)
Transition of Care [facility]) - CM/SW Discharge Note   Patient Details  Name: KAISON [place]AR[place] MRN: [id number] Date of Birth: 1929/10/07  Transition of Care [facility]) CM/SW Contact:  Allayne Butcher, RN Phone Number: 02/03/2020, 10:34 AM   Clinical Narrative:    Patient will discharge to [facility] for rehab today.  Patient is going to go to room 210.  Bedside RN is calling report to the facility at [phone].  This RNCM will arrange for transport via [facility].    Final next level of care: Skilled Nursing Facility Barriers to Discharge: Barriers Resolved   Patient Goals and CMS Choice   CMS Medicare.gov Compare Post Acute Care list provided to:: Patient Choice offered to / list presented to : Patient  Discharge Placement   Existing PASRR number confirmed : 02/02/20          Patient chooses bed at: [facility] Patient to be transferred to facility by: [facility] Name of family member notified: Darl Pikes Patient and family notified of of transfer: 02/03/20  Discharge Plan and Services     Post Acute Care Choice: Skilled Nursing Facility                               Social Determinants of Health (SDOH) Interventions     Readmission Risk Interventions No flowsheet data found.

## 2020-02-03 NOTE — Progress Notes (Signed)
Speech Language Pathology Treatment: Dysphagia (education w/ pt/family)  Patient Details Name: Brandon Calhoun MRN: [id number] DOB: [birth year] Today's Date: 02/03/2020 Time: 1030-1050 SLP Time Calculation (min) (ACUTE ONLY): 20 min  Assessment / Plan / Recommendation Clinical Impression  Pt seen for ongoing assessment of swallowing and toleration of diet; education re: swallowing, diet consistency rec'd, and general aspiration precautions including recommendation for safer swallowing of Pills by use of a PUREE -- for all Pill swallowing. Pt has a Baseline of Parkinson's Disease and reports some difficulty when swallowing "Large" pills at Baseline. .  Discussed w/ pt the current diet consistency including use of Cut meats, moistened cooked foods for ease of mastication and overall intake. Pt, family member and NSG denied any overt s/s of aspiration w/ oral intake at recent meals. Talked w/ pt, family member about reducing risks for aspiration by following general aspiration precautions to include Small sips Slowly, No straws if increased coughing noted, and Sit fully Upright w/ all oral intake. Recommended reducing distractions including Talking at meals. Also discussed food consistencies and options, preparation, and use of condiments to soften foods also. Encouragement given for ALL Pills to be swallowed using a Puree for safer clearing of the oropharynx and the Esophagus also -- to reduce risk for choking w/ Baseline medical issues and advanced age.  The above was discussed w/ pt/family member, NSG. Handouts given yesterday to family on aspiration precautions and Pill swallowing options discussed.    Recommend continue a Dysphagia level (Cut meats)/Regular diet w/ Thin liquids via Cup; aspiration precautions; Rreflux precautions; Pills in a Puree for safer swallowing. ST services will sign off at this time as pt appears at his Baseline w/ oropharyngeal phase swallowing; Education completed w/ pt,  family (and w/ family at other visit - Daughter). NSG to reconsult if new needs arise while admitted. Pt and NSG agreed.     HPI HPI: Pt is a 86 y.o. male with medical history significant of hypertension, asthma, GERD, depression, anxiety, BPH, Parkinson's disease, who presents with fall, cough, shortness breath. Patient has congested cough with mucus production.  Denies chest pain, fever or chills.  Patient does not have nausea vomiting, diarrhea or abdominal pain.  No symptoms of UTI.  No unilateral numbness or tingling his activities.  No facial droop or slurred speech.  Patient has left lower leg cellulitis, heart rate still taking Augmentin. Patient had multiple falling recently. Mental status is at his baseline per Daughter. Pt resides at an ALF. Pt had a recent Fall w/ multiple Rib fxs on Left side.       SLP Plan  All goals met       Recommendations  Diet recommendations: Thin liquid;Regular (w/ CUT meats, gravies) Liquids provided via: Cup Medication Administration: Whole meds with puree (for ease and safety of swallowing) Supervision: Patient able to self feed Compensations: Minimize environmental distractions;Slow rate;Small sips/bites;Lingual sweep for clearance of pocketing;Follow solids with liquid Postural Changes and/or Swallow Maneuvers: Seated upright 90 degrees;Upright 30-60 min after meal;Out of bed for meals                General recommendations:  (dietician f/u as needed) Oral Care Recommendations: Oral care BID;Oral care before and after PO;Patient independent with oral care Follow up Recommendations: None SLP Visit Diagnosis: Dysphagia, unspecified (R13.10) (Pill swallowing) Plan: All goals met       GO                 Orinda Kenner, MS,  CCC-SLP Maricruz Lucero 02/03/2020, 12:02 PM

## 2020-02-03 NOTE — Care Management Important Message (Signed)
Important Message  Patient Details  Name: Brandon Calhoun MRN: [id number] Date of Birth: [birth year]   Medicare Important Message Given:  Yes  Initial Medicare IM given by Patient Access Associate on 02/02/2020 at 9:30am.   Johnell Comings 02/03/2020, 8:05 AM

## 2020-02-03 NOTE — Progress Notes (Signed)
Report given to [place].  °

## 2020-02-03 NOTE — Discharge Summary (Signed)
Physician Discharge Summary  Brandon Calhoun HQP:[id number] DOB: Dec 20, 1929   PCP: [name], MD  Admit date: 01/31/2020 Discharge date: 02/03/2020 Length of Stay: 3 days   Code Status: Full Code  Admitted From: ALF Discharged to:  SNF Discharge Condition:  Stable  Recommendations for Outpatient Follow-up   1. Follow up with PCP in 1 week 2. Follow up LLE cellulitis and hyponatremia 3. Follow up BMP/CBC   Hospital Summary   Brandon Wintle Lanningis a 86 y.o.malewith medical history significant ofhypertension, asthma, GERD, depression, anxiety, BPH, Parkinson's disease, who presented with fall, cough, shortness breath.  Patient has coughandshortness of breathfor several days, which had been progressively worsening. Patient had been congestedcough with mucus production.On presentation he denied chest pain, fever or chills. Patient does not have nausea vomiting, diarrhea or abdominal pain. No symptoms of UTI. No unilateral numbness or tingling his activities. No facial droop or slurred speech. Patient has left lower leg cellulitis and was still taking Augmentin on presentation for unknown period of time per patient at bedside on 8/18.Patient hadmultiple fallsrecently with smallskin tear in his elbows,withbruise in left hip and head.  ED Course: pt was found to have WBC 9.0, negative urinalysis, pending CK level, negative COVID-19 PCR, sodium 120,, troponin level 7, temperature normal, blood pressure 146/63, heart rate 65, RR 16, oxygen saturation 94% on room air.  CT head is negative for acute intracranial abnormalities.  CT of C-spine is negative for acute bony fracture, but did show degenerative disc disease.  Negative x-ray for left elbow, right elbow and left hip.With acute appearing fractures of the left posterior lateral fourth through ninth ribs as well as patchy and hazy opacity in mid and lower left lung suspicious for infiltrate and a small left pleural  effusion.    He received ceftriaxone, metronidazole and azithromycin on presentation and was continued on Unasyn monotherapy for suspected aspiration pneumonia and to treat his left lower extremity cellulitis.  8/16: Patient endorsing some difficulty bringing up his secretions.  Complains of pain in the left elbow due to skin tear  8/17: Patient seen and examined.  Daughter at bedside.  More awake this morning.  No oxygen requirement.  Seen by speech therapy.  Cleared for regular diet.  Sodium remains low but improving over interval.  8/18: Son-in-law at bedside.  Sodium improved to 130 which is slightly below his baseline.  Tolerating room air, no fevers or white count.  Still with left lower extremity cellulitis but seems to be improving.  Was treated with Keflex for cellulitis in the past with resolution of symptoms.  Since he was treated with Augmentin prior to admission as well as Unasyn during admission with resolution of his respiratory issues, he was discharged on Keflex 500 mg 3 times daily x5 days and recommended PCP follow-up with outpatient labs in 1 week.   A & P   Principal Problem:   Aspiration pneumonia (HCC) Active Problems:   GERD (gastroesophageal reflux disease)   Parkinson's disease (HCC)   BPH with obstruction/lower urinary tract symptoms   Hyponatremia   HTN (hypertension)   Depression   Fall   Multiple rib fractures   Cellulitis of left lower leg   Asthma  1. Shortness of breath concern for possible aspiration pneumonia 1. Afebrile without leukocytosis and significantly improved symptoms 2. Completed 4 days antibiotics inpatient as well as Augmentin prior to admission 3. Continue home meds  2. Left lower extremity cellulitis 1. Tolerated Keflex 500 mg 3 times daily  in the past for the same 2. will discharge with Keflex for several days and recommend outpatient follow-up to ensure resolution  3. S/P multiple falls with resultant rib fractures 1. Continue  incentive spirometry  4. Acute on chronic hyponatremia suspect due to poor oral intake 1. Improved with fluid restriction and held spironolactone as well as IV fluids 2. Encourage p.o. intake 3. Can restart home meds and follow-up BMP outpatient  5. Asthma 1. Continue home meds  6. GERD 7. Parkinson's disease on Sinemet 8. Hypertension on spironolactone 9. Depression on Celexa   Consultants  . None  Procedures  . None  Antibiotics   Anti-infectives (From admission, onward)   Start     Dose/Rate Route Frequency Ordered Stop   02/03/20 0000  cephALEXin (KEFLEX) 500 MG capsule     Discontinue     500 mg Oral 3 times daily 02/03/20 1023 02/08/20 2359   02/01/20 1500  cefTRIAXone (ROCEPHIN) 1 g in sodium chloride 0.9 % 100 mL IVPB  Status:  Discontinued        1 g 200 mL/hr over 30 Minutes Intravenous Every 24 hours 01/31/20 1751 02/01/20 0752   02/01/20 0800  Ampicillin-Sulbactam (UNASYN) 3 g in sodium chloride 0.9 % 100 mL IVPB     Discontinue     3 g 200 mL/hr over 30 Minutes Intravenous Every 6 hours 02/01/20 0752     01/31/20 1800  metroNIDAZOLE (FLAGYL) IVPB 500 mg  Status:  Discontinued        500 mg 100 mL/hr over 60 Minutes Intravenous Every 8 hours 01/31/20 1752 02/01/20 0752   01/31/20 1515  cefTRIAXone (ROCEPHIN) 1 g in sodium chloride 0.9 % 100 mL IVPB        1 g 200 mL/hr over 30 Minutes Intravenous  Once 01/31/20 1511 01/31/20 1617   01/31/20 1515  azithromycin (ZITHROMAX) 500 mg in sodium chloride 0.9 % 250 mL IVPB        500 mg 250 mL/hr over 60 Minutes Intravenous  Once 01/31/20 1511 01/31/20 1735       Subjective  Patient seen and examined at bedside no acute distress and resting comfortably.  No events overnight.  Tolerating diet. In good spirits and anticipating discharge.  Illustrated proper incentive spirometry technique.  Denies any chest pain, shortness of breath, fever, nausea, vomiting, urinary or bowel complaints. Otherwise ROS negative     Objective   Discharge Exam: Vitals:   02/03/20 0349 02/03/20 0809  BP: (!) 126/57 138/67  Pulse: 68 65  Resp: 17 18  Temp: 98 F (36.7 C) 97.9 F (36.6 C)  SpO2: 94% 100%   Vitals:   02/02/20 2010 02/03/20 0028 02/03/20 0349 02/03/20 0809  BP: 124/60 (!) 121/49 (!) 126/57 138/67  Pulse: 70 65 68 65  Resp: 18 14 17 18   Temp: 97.9 F (36.6 C) 98 F (36.7 C) 98 F (36.7 C) 97.9 F (36.6 C)  TempSrc:  Oral  Oral  SpO2: 97% 96% 94% 100%  Weight:      Height:        Physical Exam Vitals and nursing note reviewed.  Constitutional:      Appearance: Normal appearance.  HENT:     Head: Normocephalic and atraumatic.  Eyes:     Conjunctiva/sclera: Conjunctivae normal.  Cardiovascular:     Rate and Rhythm: Normal rate and regular rhythm.  Pulmonary:     Effort: Pulmonary effort is normal.     Breath sounds: Normal breath sounds.  Abdominal:     General: Abdomen is flat.     Palpations: Abdomen is soft.  Musculoskeletal:        General: No swelling or tenderness.  Skin:    Coloration: Skin is not jaundiced.     Comments: LLE erythema over shin with 1 + pitting edema, slight warmth and minimal to no pain  Neurological:     Mental Status: He is alert. Mental status is at baseline.  Psychiatric:        Mood and Affect: Mood normal.        Behavior: Behavior normal.       The results of significant diagnostics from this hospitalization (including imaging, microbiology, ancillary and laboratory) are listed below for reference.     Microbiology: Recent Results (from the past 240 hour(s))  SARS Coronavirus 2 by RT PCR (hospital order, performed in [facility] hospital lab) Nasopharyngeal Nasopharyngeal Swab     Status: None   Collection Time: 01/31/20  6:11 PM   Specimen: Nasopharyngeal Swab  Result Value Ref Range Status   SARS Coronavirus 2 NEGATIVE NEGATIVE Final    Comment: (NOTE) SARS-CoV-2 target nucleic acids are NOT DETECTED.  The SARS-CoV-2 RNA is  generally detectable in upper and lower respiratory specimens during the acute phase of infection. The lowest concentration of SARS-CoV-2 viral copies this assay can detect is 250 copies / mL. A negative result does not preclude SARS-CoV-2 infection and should not be used as the sole basis for treatment or other patient management decisions.  A negative result may occur with improper specimen collection / handling, submission of specimen other than nasopharyngeal swab, presence of viral mutation(s) within the areas targeted by this assay, and inadequate number of viral copies (<250 copies / mL). A negative result must be combined with clinical observations, patient history, and epidemiological information.  Fact Sheet for Patients:   BoilerBrush.com.cy  Fact Sheet for Healthcare Providers: https://pope.com/  This test is not yet approved or  cleared by the [place] FDA and has been authorized for detection and/or diagnosis of SARS-CoV-2 by FDA under an Emergency Use Authorization (EUA).  This EUA will remain in effect (meaning this test can be used) for the duration of the COVID-19 declaration under Section 564(b)(1) of the Act, 21 U.S.C. section 360bbb-3(b)(1), unless the authorization is terminated or revoked sooner.  Performed at [facility], [address] [place], [place] [address]   Culture, blood (routine x 2)     Status: None (Preliminary result)   Collection Time: 01/31/20  9:30 PM   Specimen: BLOOD  Result Value Ref Range Status   Specimen Description BLOOD BLOOD LEFT FOREARM  Final   Special Requests   Final    BOTTLES DRAWN AEROBIC AND ANAEROBIC Blood Culture results may not be optimal due to an excessive volume of blood received in culture bottles   Culture   Final    NO GROWTH 3 DAYS Performed at [facility], [address] [place], [place] [address]    Report Status PENDING  Incomplete   Culture, blood (routine x 2)     Status: None (Preliminary result)   Collection Time: 01/31/20  9:30 PM   Specimen: BLOOD  Result Value Ref Range Status   Specimen Description BLOOD BLOOD RIGHT FOREARM  Final   Special Requests   Final    BOTTLES DRAWN AEROBIC AND ANAEROBIC Blood Culture adequate volume   Culture   Final    NO GROWTH 3 DAYS Performed  at [facility], [address], [place], [place] [address]    Report Status PENDING  Incomplete  Culture, sputum-assessment     Status: None   Collection Time: 02/01/20  7:20 AM   Specimen: Sputum  Result Value Ref Range Status   Specimen Description SPUTUM  Final   Special Requests NONE  Final   Sputum evaluation   Final    Sputum specimen not acceptable for testing.  Please recollect.   CALLED LAURA CATES AT [phone] ON 02/01/20 TO NOTIFIY RECOLLECTION  NEEDED SNG  Performed at [facility], [address] [place], [place] [address]    Report Status 02/01/2020 FINAL  Final     Labs: BNP (last 3 results) No results for input(s): BNP in the last 8760 hours. Basic Metabolic Panel: Recent Labs  Lab 01/31/20 0952 02/01/20 0140 02/01/20 1556 02/01/20 1913 02/03/20 0615  NA 120* 125* 126* 127* 130*  K 3.8 3.7 3.8 4.0 3.8  CL 88* 94* 95* 96* 98  CO2 21* 23 22 22 25   GLUCOSE 106* 94 108* 121* 94  BUN 12 12 14 16 16   CREATININE 0.43* 0.45* 0.51* 0.49* 0.48*  CALCIUM 7.8* 7.8* 7.6* 7.7* 7.6*   Liver Function Tests: Recent Labs  Lab 01/31/20 0952  AST 23  ALT 20  ALKPHOS 123  BILITOT 1.1  PROT 5.6*  ALBUMIN 2.9*   No results for input(s): LIPASE, AMYLASE in the last 168 hours. No results for input(s): AMMONIA in the last 168 hours. CBC: Recent Labs  Lab 01/31/20 0952 02/01/20 0629 02/03/20 0615  WBC 9.0 9.2 7.7  NEUTROABS 6.6  --  4.8  HGB 11.7* 11.3* 10.4*  HCT 31.4* 31.6* 29.7*  MCV 84.6 86.8 88.9  PLT 352 348 370   Cardiac Enzymes: Recent Labs  Lab 01/31/20 1343  CKTOTAL 95    BNP: Invalid input(s): POCBNP CBG: No results for input(s): GLUCAP in the last 168 hours. D-Dimer No results for input(s): DDIMER in the last 72 hours. Hgb A1c No results for input(s): HGBA1C in the last 72 hours. Lipid Profile No results for input(s): CHOL, HDL, LDLCALC, TRIG, CHOLHDL, LDLDIRECT in the last 72 hours. Thyroid function studies Recent Labs    02/01/20 0629  TSH 1.957   Anemia work up No results for input(s): VITAMINB12, FOLATE, FERRITIN, TIBC, IRON, RETICCTPCT in the last 72 hours. Urinalysis    Component Value Date/Time   COLORURINE YELLOW (A) 01/31/2020 1811   APPEARANCEUR CLEAR (A) 01/31/2020 1811   LABSPEC 1.015 01/31/2020 1811   PHURINE 6.0 01/31/2020 1811   GLUCOSEU NEGATIVE 01/31/2020 1811   HGBUR NEGATIVE 01/31/2020 1811   BILIRUBINUR NEGATIVE 01/31/2020 1811   KETONESUR 20 (A) 01/31/2020 1811   PROTEINUR NEGATIVE 01/31/2020 1811   NITRITE NEGATIVE 01/31/2020 1811   LEUKOCYTESUR NEGATIVE 01/31/2020 1811   Sepsis Labs Invalid input(s): PROCALCITONIN,  WBC,  LACTICIDVEN Microbiology Recent Results (from the past 240 hour(s))  SARS Coronavirus 2 by RT PCR (hospital order, performed in [facility] hospital lab) Nasopharyngeal Nasopharyngeal Swab     Status: None   Collection Time: 01/31/20  6:11 PM   Specimen: Nasopharyngeal Swab  Result Value Ref Range Status   SARS Coronavirus 2 NEGATIVE NEGATIVE Final    Comment: (NOTE) SARS-CoV-2 target nucleic acids are NOT DETECTED.  The SARS-CoV-2 RNA is generally detectable in upper and lower respiratory specimens during the acute phase of infection. The lowest concentration of SARS-CoV-2 viral copies this assay can detect is 250 copies / mL.  A negative result does not preclude SARS-CoV-2 infection and should not be used as the sole basis for treatment or other patient management decisions.  A negative result may occur with improper specimen collection / handling, submission of specimen other than  nasopharyngeal swab, presence of viral mutation(s) within the areas targeted by this assay, and inadequate number of viral copies (<250 copies / mL). A negative result must be combined with clinical observations, patient history, and epidemiological information.  Fact Sheet for Patients:   BoilerBrush.com.cyhttps://www.fda.gov/media/136312/download  Fact Sheet for Healthcare Providers: https://pope.com/https://www.fda.gov/media/136313/download  This test is not yet approved or  cleared by the [place] FDA and has been authorized for detection and/or diagnosis of SARS-CoV-2 by FDA under an Emergency Use Authorization (EUA).  This EUA will remain in effect (meaning this test can be used) for the duration of the COVID-19 declaration under Section 564(b)(1) of the Act, 21 U.S.C. section 360bbb-3(b)(1), unless the authorization is terminated or revoked sooner.  Performed at [facility], [address] [place], [place] [address]   Culture, blood (routine x 2)     Status: None (Preliminary result)   Collection Time: 01/31/20  9:30 PM   Specimen: BLOOD  Result Value Ref Range Status   Specimen Description BLOOD BLOOD LEFT FOREARM  Final   Special Requests   Final    BOTTLES DRAWN AEROBIC AND ANAEROBIC Blood Culture results may not be optimal due to an excessive volume of blood received in culture bottles   Culture   Final    NO GROWTH 3 DAYS Performed at [facility], [address] [place], [place] [address]    Report Status PENDING  Incomplete  Culture, blood (routine x 2)     Status: None (Preliminary result)   Collection Time: 01/31/20  9:30 PM   Specimen: BLOOD  Result Value Ref Range Status   Specimen Description BLOOD BLOOD RIGHT FOREARM  Final   Special Requests   Final    BOTTLES DRAWN AEROBIC AND ANAEROBIC Blood Culture adequate volume   Culture   Final    NO GROWTH 3 DAYS Performed at [facility], [address] [place], [place] [address]    Report Status PENDING   Incomplete  Culture, sputum-assessment     Status: None   Collection Time: 02/01/20  7:20 AM   Specimen: Sputum  Result Value Ref Range Status   Specimen Description SPUTUM  Final   Special Requests NONE  Final   Sputum evaluation   Final    Sputum specimen not acceptable for testing.  Please recollect.   CALLED LAURA CATES AT [phone] ON 02/01/20 TO NOTIFIY RECOLLECTION  NEEDED SNG  Performed at [facility], [address] [place], [place] [address]    Report Status 02/01/2020 FINAL  Final    Discharge Instructions     Discharge Instructions    Diet - low sodium heart healthy   Complete by: As directed    Discharge instructions   Complete by: As directed    - take Keflex 500 mg three times daily for the next 5 days. Monitor for persistent diarrhea on this medication and notify your doctor if this occurs - follow up with your primary care physician next week with lab work - continue to eat and drink regularly If you have any significant change or worsening of your symptoms, do not hesitate to contact your primary care physician or return to the ED.   Increase activity slowly   Complete by:  As directed    No wound care   Complete by: As directed      Allergies as of 02/03/2020   No Known Allergies     Medication List    STOP taking these medications   amoxicillin-clavulanate 875-125 MG tablet Commonly known as: AUGMENTIN     TAKE these medications   acetaminophen 325 MG tablet Commonly known as: TYLENOL Take 650 mg by mouth every 6 (six) hours as needed.   albuterol 108 (90 Base) MCG/ACT inhaler Commonly known as: VENTOLIN HFA Inhale 2 puffs into the lungs 3 (three) times daily as needed for wheezing or shortness of breath.   alum & mag hydroxide-simeth 200-200-20 MG/5ML suspension Commonly known as: MAALOX/MYLANTA Take 30 mLs by mouth every 6 (six) hours as needed for indigestion or heartburn.   bifidobacterium infantis capsule Take 1 capsule by mouth  daily.   budesonide-formoterol 160-4.5 MCG/ACT inhaler Commonly known as: SYMBICORT Inhale 2 puffs into the lungs 2 (two) times daily.   carbidopa-levodopa 25-100 MG tablet Commonly known as: SINEMET IR Take 1 tablet by mouth 3 (three) times daily.   cephALEXin 500 MG capsule Commonly known as: KEFLEX Take 1 capsule (500 mg total) by mouth 3 (three) times daily for 5 days.   cetirizine 10 MG tablet Commonly known as: ZYRTEC Take 10 mg by mouth daily as needed for allergies.   citalopram 10 MG tablet Commonly known as: CELEXA Take 10 mg by mouth every other day.   fluticasone 50 MCG/ACT nasal spray Commonly known as: FLONASE Place 2 sprays into the nose daily.   Milk of Magnesia 400 MG/5ML suspension Generic drug: magnesium hydroxide Take 30 mLs by mouth daily as needed for mild constipation.   montelukast 10 MG tablet Commonly known as: SINGULAIR Take 10 mg by mouth at bedtime.   omeprazole 20 MG capsule Commonly known as: PRILOSEC Take 1 capsule by mouth daily.   spironolactone 50 MG tablet Commonly known as: ALDACTONE Take 50 mg by mouth daily.   tamsulosin 0.4 MG Caps capsule Commonly known as: FLOMAX Take 0.4 mg by mouth daily.       Contact information for after-discharge care    Destination    [facility] .   Service: Skilled Nursing Contact information: [address] [place] [phone]                 No Known Allergies   Dispo: The patient is from: ALF              Anticipated d/c is to: SNF              Anticipated d/c date is:              Patient currently is medically stable to d/c.       Time coordinating discharge: Over 30 minutes   SIGNED:   Jae Dire, D.O. [facility] Pager: [phone]  02/03/2020, 10:54 AM

## 2020-02-04 DIAGNOSIS — E871 Hypo-osmolality and hyponatremia: Secondary | ICD-10-CM | POA: Diagnosis not present

## 2020-02-04 DIAGNOSIS — S2232XA Fracture of one rib, left side, initial encounter for closed fracture: Secondary | ICD-10-CM

## 2020-02-04 DIAGNOSIS — F39 Unspecified mood [affective] disorder: Secondary | ICD-10-CM

## 2020-02-04 DIAGNOSIS — L03116 Cellulitis of left lower limb: Secondary | ICD-10-CM | POA: Diagnosis not present

## 2020-02-04 DIAGNOSIS — G20 Parkinson's disease: Secondary | ICD-10-CM | POA: Diagnosis not present

## 2020-02-04 DIAGNOSIS — J690 Pneumonitis due to inhalation of food and vomit: Secondary | ICD-10-CM | POA: Diagnosis not present

## 2020-02-05 DIAGNOSIS — B351 Tinea unguium: Secondary | ICD-10-CM | POA: Diagnosis not present

## 2020-02-05 LAB — CULTURE, BLOOD (ROUTINE X 2)
Culture: NO GROWTH
Culture: NO GROWTH
Special Requests: ADEQUATE

## 2020-02-11 DIAGNOSIS — I10 Essential (primary) hypertension: Secondary | ICD-10-CM | POA: Diagnosis not present

## 2020-02-15 MED FILL — Carbidopa & Levodopa Tab 25-100 MG: ORAL | Qty: 1 | Status: AC

## 2020-02-15 MED FILL — Montelukast Sodium Tab 10 MG (Base Equiv): ORAL | Qty: 10 | Status: AC

## 2020-02-15 MED FILL — Enoxaparin Sodium Inj 40 MG/0.4ML: SUBCUTANEOUS | Qty: 0.4 | Status: AC

## 2020-02-22 DIAGNOSIS — R2681 Unsteadiness on feet: Secondary | ICD-10-CM | POA: Diagnosis not present

## 2020-02-22 DIAGNOSIS — R4189 Other symptoms and signs involving cognitive functions and awareness: Secondary | ICD-10-CM | POA: Diagnosis not present

## 2020-02-22 DIAGNOSIS — F39 Unspecified mood [affective] disorder: Secondary | ICD-10-CM | POA: Diagnosis not present

## 2020-02-22 DIAGNOSIS — W19XXXD Unspecified fall, subsequent encounter: Secondary | ICD-10-CM | POA: Diagnosis not present

## 2020-02-22 DIAGNOSIS — G20 Parkinson's disease: Secondary | ICD-10-CM | POA: Diagnosis not present

## 2020-02-22 DIAGNOSIS — R262 Difficulty in walking, not elsewhere classified: Secondary | ICD-10-CM | POA: Diagnosis not present

## 2020-02-22 DIAGNOSIS — M6281 Muscle weakness (generalized): Secondary | ICD-10-CM | POA: Diagnosis not present

## 2020-02-22 DIAGNOSIS — Z741 Need for assistance with personal care: Secondary | ICD-10-CM | POA: Diagnosis not present

## 2020-02-22 DIAGNOSIS — F419 Anxiety disorder, unspecified: Secondary | ICD-10-CM | POA: Diagnosis not present

## 2020-02-22 DIAGNOSIS — R278 Other lack of coordination: Secondary | ICD-10-CM | POA: Diagnosis not present

## 2020-02-23 DIAGNOSIS — F39 Unspecified mood [affective] disorder: Secondary | ICD-10-CM | POA: Diagnosis not present

## 2020-02-23 DIAGNOSIS — W19XXXD Unspecified fall, subsequent encounter: Secondary | ICD-10-CM | POA: Diagnosis not present

## 2020-02-23 DIAGNOSIS — R2681 Unsteadiness on feet: Secondary | ICD-10-CM | POA: Diagnosis not present

## 2020-02-23 DIAGNOSIS — Z741 Need for assistance with personal care: Secondary | ICD-10-CM | POA: Diagnosis not present

## 2020-02-23 DIAGNOSIS — F419 Anxiety disorder, unspecified: Secondary | ICD-10-CM | POA: Diagnosis not present

## 2020-02-23 DIAGNOSIS — R262 Difficulty in walking, not elsewhere classified: Secondary | ICD-10-CM | POA: Diagnosis not present

## 2020-02-23 DIAGNOSIS — R4189 Other symptoms and signs involving cognitive functions and awareness: Secondary | ICD-10-CM | POA: Diagnosis not present

## 2020-02-23 DIAGNOSIS — G20 Parkinson's disease: Secondary | ICD-10-CM | POA: Diagnosis not present

## 2020-02-23 DIAGNOSIS — R278 Other lack of coordination: Secondary | ICD-10-CM | POA: Diagnosis not present

## 2020-02-23 DIAGNOSIS — M6281 Muscle weakness (generalized): Secondary | ICD-10-CM | POA: Diagnosis not present

## 2020-02-24 DIAGNOSIS — R262 Difficulty in walking, not elsewhere classified: Secondary | ICD-10-CM | POA: Diagnosis not present

## 2020-02-24 DIAGNOSIS — R2681 Unsteadiness on feet: Secondary | ICD-10-CM | POA: Diagnosis not present

## 2020-02-24 DIAGNOSIS — F39 Unspecified mood [affective] disorder: Secondary | ICD-10-CM | POA: Diagnosis not present

## 2020-02-24 DIAGNOSIS — Z741 Need for assistance with personal care: Secondary | ICD-10-CM | POA: Diagnosis not present

## 2020-02-24 DIAGNOSIS — W19XXXD Unspecified fall, subsequent encounter: Secondary | ICD-10-CM | POA: Diagnosis not present

## 2020-02-24 DIAGNOSIS — F419 Anxiety disorder, unspecified: Secondary | ICD-10-CM | POA: Diagnosis not present

## 2020-02-24 DIAGNOSIS — R4189 Other symptoms and signs involving cognitive functions and awareness: Secondary | ICD-10-CM | POA: Diagnosis not present

## 2020-02-24 DIAGNOSIS — M6281 Muscle weakness (generalized): Secondary | ICD-10-CM | POA: Diagnosis not present

## 2020-02-24 DIAGNOSIS — R278 Other lack of coordination: Secondary | ICD-10-CM | POA: Diagnosis not present

## 2020-02-24 DIAGNOSIS — G20 Parkinson's disease: Secondary | ICD-10-CM | POA: Diagnosis not present

## 2020-02-25 DIAGNOSIS — Z741 Need for assistance with personal care: Secondary | ICD-10-CM | POA: Diagnosis not present

## 2020-02-25 DIAGNOSIS — R2681 Unsteadiness on feet: Secondary | ICD-10-CM | POA: Diagnosis not present

## 2020-02-25 DIAGNOSIS — G20 Parkinson's disease: Secondary | ICD-10-CM | POA: Diagnosis not present

## 2020-02-25 DIAGNOSIS — R262 Difficulty in walking, not elsewhere classified: Secondary | ICD-10-CM | POA: Diagnosis not present

## 2020-02-25 DIAGNOSIS — R278 Other lack of coordination: Secondary | ICD-10-CM | POA: Diagnosis not present

## 2020-02-25 DIAGNOSIS — F39 Unspecified mood [affective] disorder: Secondary | ICD-10-CM | POA: Diagnosis not present

## 2020-02-25 DIAGNOSIS — W19XXXD Unspecified fall, subsequent encounter: Secondary | ICD-10-CM | POA: Diagnosis not present

## 2020-02-25 DIAGNOSIS — M6281 Muscle weakness (generalized): Secondary | ICD-10-CM | POA: Diagnosis not present

## 2020-02-25 DIAGNOSIS — F419 Anxiety disorder, unspecified: Secondary | ICD-10-CM | POA: Diagnosis not present

## 2020-02-25 DIAGNOSIS — R4189 Other symptoms and signs involving cognitive functions and awareness: Secondary | ICD-10-CM | POA: Diagnosis not present

## 2020-02-26 DIAGNOSIS — R4189 Other symptoms and signs involving cognitive functions and awareness: Secondary | ICD-10-CM | POA: Diagnosis not present

## 2020-02-26 DIAGNOSIS — G20 Parkinson's disease: Secondary | ICD-10-CM | POA: Diagnosis not present

## 2020-02-26 DIAGNOSIS — R262 Difficulty in walking, not elsewhere classified: Secondary | ICD-10-CM | POA: Diagnosis not present

## 2020-02-26 DIAGNOSIS — R2681 Unsteadiness on feet: Secondary | ICD-10-CM | POA: Diagnosis not present

## 2020-02-26 DIAGNOSIS — R278 Other lack of coordination: Secondary | ICD-10-CM | POA: Diagnosis not present

## 2020-02-26 DIAGNOSIS — F419 Anxiety disorder, unspecified: Secondary | ICD-10-CM | POA: Diagnosis not present

## 2020-02-26 DIAGNOSIS — F39 Unspecified mood [affective] disorder: Secondary | ICD-10-CM | POA: Diagnosis not present

## 2020-02-26 DIAGNOSIS — W19XXXD Unspecified fall, subsequent encounter: Secondary | ICD-10-CM | POA: Diagnosis not present

## 2020-02-26 DIAGNOSIS — M6281 Muscle weakness (generalized): Secondary | ICD-10-CM | POA: Diagnosis not present

## 2020-02-26 DIAGNOSIS — Z741 Need for assistance with personal care: Secondary | ICD-10-CM | POA: Diagnosis not present

## 2020-02-29 DIAGNOSIS — R262 Difficulty in walking, not elsewhere classified: Secondary | ICD-10-CM | POA: Diagnosis not present

## 2020-02-29 DIAGNOSIS — G20 Parkinson's disease: Secondary | ICD-10-CM | POA: Diagnosis not present

## 2020-02-29 DIAGNOSIS — F39 Unspecified mood [affective] disorder: Secondary | ICD-10-CM | POA: Diagnosis not present

## 2020-02-29 DIAGNOSIS — R278 Other lack of coordination: Secondary | ICD-10-CM | POA: Diagnosis not present

## 2020-02-29 DIAGNOSIS — Z741 Need for assistance with personal care: Secondary | ICD-10-CM | POA: Diagnosis not present

## 2020-02-29 DIAGNOSIS — R2681 Unsteadiness on feet: Secondary | ICD-10-CM | POA: Diagnosis not present

## 2020-02-29 DIAGNOSIS — F419 Anxiety disorder, unspecified: Secondary | ICD-10-CM | POA: Diagnosis not present

## 2020-02-29 DIAGNOSIS — R4189 Other symptoms and signs involving cognitive functions and awareness: Secondary | ICD-10-CM | POA: Diagnosis not present

## 2020-02-29 DIAGNOSIS — W19XXXD Unspecified fall, subsequent encounter: Secondary | ICD-10-CM | POA: Diagnosis not present

## 2020-02-29 DIAGNOSIS — M6281 Muscle weakness (generalized): Secondary | ICD-10-CM | POA: Diagnosis not present

## 2020-03-01 DIAGNOSIS — G20 Parkinson's disease: Secondary | ICD-10-CM | POA: Diagnosis not present

## 2020-03-01 DIAGNOSIS — F39 Unspecified mood [affective] disorder: Secondary | ICD-10-CM | POA: Diagnosis not present

## 2020-03-01 DIAGNOSIS — W19XXXD Unspecified fall, subsequent encounter: Secondary | ICD-10-CM | POA: Diagnosis not present

## 2020-03-01 DIAGNOSIS — R278 Other lack of coordination: Secondary | ICD-10-CM | POA: Diagnosis not present

## 2020-03-01 DIAGNOSIS — Z741 Need for assistance with personal care: Secondary | ICD-10-CM | POA: Diagnosis not present

## 2020-03-01 DIAGNOSIS — F419 Anxiety disorder, unspecified: Secondary | ICD-10-CM | POA: Diagnosis not present

## 2020-03-01 DIAGNOSIS — R262 Difficulty in walking, not elsewhere classified: Secondary | ICD-10-CM | POA: Diagnosis not present

## 2020-03-01 DIAGNOSIS — M6281 Muscle weakness (generalized): Secondary | ICD-10-CM | POA: Diagnosis not present

## 2020-03-01 DIAGNOSIS — R2681 Unsteadiness on feet: Secondary | ICD-10-CM | POA: Diagnosis not present

## 2020-03-01 DIAGNOSIS — R4189 Other symptoms and signs involving cognitive functions and awareness: Secondary | ICD-10-CM | POA: Diagnosis not present

## 2020-03-02 DIAGNOSIS — W19XXXD Unspecified fall, subsequent encounter: Secondary | ICD-10-CM | POA: Diagnosis not present

## 2020-03-02 DIAGNOSIS — R4189 Other symptoms and signs involving cognitive functions and awareness: Secondary | ICD-10-CM | POA: Diagnosis not present

## 2020-03-02 DIAGNOSIS — R278 Other lack of coordination: Secondary | ICD-10-CM | POA: Diagnosis not present

## 2020-03-02 DIAGNOSIS — Z741 Need for assistance with personal care: Secondary | ICD-10-CM | POA: Diagnosis not present

## 2020-03-02 DIAGNOSIS — R262 Difficulty in walking, not elsewhere classified: Secondary | ICD-10-CM | POA: Diagnosis not present

## 2020-03-02 DIAGNOSIS — G20 Parkinson's disease: Secondary | ICD-10-CM | POA: Diagnosis not present

## 2020-03-02 DIAGNOSIS — F419 Anxiety disorder, unspecified: Secondary | ICD-10-CM | POA: Diagnosis not present

## 2020-03-02 DIAGNOSIS — M6281 Muscle weakness (generalized): Secondary | ICD-10-CM | POA: Diagnosis not present

## 2020-03-02 DIAGNOSIS — R2681 Unsteadiness on feet: Secondary | ICD-10-CM | POA: Diagnosis not present

## 2020-03-02 DIAGNOSIS — F39 Unspecified mood [affective] disorder: Secondary | ICD-10-CM | POA: Diagnosis not present

## 2020-03-03 DIAGNOSIS — R4189 Other symptoms and signs involving cognitive functions and awareness: Secondary | ICD-10-CM | POA: Diagnosis not present

## 2020-03-03 DIAGNOSIS — W19XXXD Unspecified fall, subsequent encounter: Secondary | ICD-10-CM | POA: Diagnosis not present

## 2020-03-03 DIAGNOSIS — M6281 Muscle weakness (generalized): Secondary | ICD-10-CM | POA: Diagnosis not present

## 2020-03-03 DIAGNOSIS — R262 Difficulty in walking, not elsewhere classified: Secondary | ICD-10-CM | POA: Diagnosis not present

## 2020-03-03 DIAGNOSIS — R278 Other lack of coordination: Secondary | ICD-10-CM | POA: Diagnosis not present

## 2020-03-03 DIAGNOSIS — J45909 Unspecified asthma, uncomplicated: Secondary | ICD-10-CM | POA: Diagnosis not present

## 2020-03-03 DIAGNOSIS — Z741 Need for assistance with personal care: Secondary | ICD-10-CM | POA: Diagnosis not present

## 2020-03-03 DIAGNOSIS — G20 Parkinson's disease: Secondary | ICD-10-CM | POA: Diagnosis not present

## 2020-03-03 DIAGNOSIS — F39 Unspecified mood [affective] disorder: Secondary | ICD-10-CM | POA: Diagnosis not present

## 2020-03-03 DIAGNOSIS — N400 Enlarged prostate without lower urinary tract symptoms: Secondary | ICD-10-CM | POA: Diagnosis not present

## 2020-03-03 DIAGNOSIS — R2681 Unsteadiness on feet: Secondary | ICD-10-CM | POA: Diagnosis not present

## 2020-03-03 DIAGNOSIS — F419 Anxiety disorder, unspecified: Secondary | ICD-10-CM | POA: Diagnosis not present

## 2020-03-03 DIAGNOSIS — I872 Venous insufficiency (chronic) (peripheral): Secondary | ICD-10-CM

## 2020-03-04 DIAGNOSIS — M6281 Muscle weakness (generalized): Secondary | ICD-10-CM | POA: Diagnosis not present

## 2020-03-04 DIAGNOSIS — G20 Parkinson's disease: Secondary | ICD-10-CM | POA: Diagnosis not present

## 2020-03-04 DIAGNOSIS — F39 Unspecified mood [affective] disorder: Secondary | ICD-10-CM | POA: Diagnosis not present

## 2020-03-04 DIAGNOSIS — R2681 Unsteadiness on feet: Secondary | ICD-10-CM | POA: Diagnosis not present

## 2020-03-04 DIAGNOSIS — Z741 Need for assistance with personal care: Secondary | ICD-10-CM | POA: Diagnosis not present

## 2020-03-04 DIAGNOSIS — R262 Difficulty in walking, not elsewhere classified: Secondary | ICD-10-CM | POA: Diagnosis not present

## 2020-03-04 DIAGNOSIS — R278 Other lack of coordination: Secondary | ICD-10-CM | POA: Diagnosis not present

## 2020-03-04 DIAGNOSIS — W19XXXD Unspecified fall, subsequent encounter: Secondary | ICD-10-CM | POA: Diagnosis not present

## 2020-03-04 DIAGNOSIS — F419 Anxiety disorder, unspecified: Secondary | ICD-10-CM | POA: Diagnosis not present

## 2020-03-04 DIAGNOSIS — R4189 Other symptoms and signs involving cognitive functions and awareness: Secondary | ICD-10-CM | POA: Diagnosis not present

## 2020-03-06 ENCOUNTER — Encounter: Payer: Self-pay | Admitting: Internal Medicine

## 2020-03-07 DIAGNOSIS — M6281 Muscle weakness (generalized): Secondary | ICD-10-CM | POA: Diagnosis not present

## 2020-03-07 DIAGNOSIS — Z741 Need for assistance with personal care: Secondary | ICD-10-CM | POA: Diagnosis not present

## 2020-03-07 DIAGNOSIS — G20 Parkinson's disease: Secondary | ICD-10-CM | POA: Diagnosis not present

## 2020-03-07 DIAGNOSIS — R278 Other lack of coordination: Secondary | ICD-10-CM | POA: Diagnosis not present

## 2020-03-07 DIAGNOSIS — W19XXXD Unspecified fall, subsequent encounter: Secondary | ICD-10-CM | POA: Diagnosis not present

## 2020-03-07 DIAGNOSIS — R4189 Other symptoms and signs involving cognitive functions and awareness: Secondary | ICD-10-CM | POA: Diagnosis not present

## 2020-03-07 DIAGNOSIS — F39 Unspecified mood [affective] disorder: Secondary | ICD-10-CM | POA: Diagnosis not present

## 2020-03-07 DIAGNOSIS — R2681 Unsteadiness on feet: Secondary | ICD-10-CM | POA: Diagnosis not present

## 2020-03-07 DIAGNOSIS — F419 Anxiety disorder, unspecified: Secondary | ICD-10-CM | POA: Diagnosis not present

## 2020-03-07 DIAGNOSIS — R262 Difficulty in walking, not elsewhere classified: Secondary | ICD-10-CM | POA: Diagnosis not present

## 2020-03-08 DIAGNOSIS — R2681 Unsteadiness on feet: Secondary | ICD-10-CM | POA: Diagnosis not present

## 2020-03-08 DIAGNOSIS — Z741 Need for assistance with personal care: Secondary | ICD-10-CM | POA: Diagnosis not present

## 2020-03-08 DIAGNOSIS — W19XXXD Unspecified fall, subsequent encounter: Secondary | ICD-10-CM | POA: Diagnosis not present

## 2020-03-08 DIAGNOSIS — R262 Difficulty in walking, not elsewhere classified: Secondary | ICD-10-CM | POA: Diagnosis not present

## 2020-03-08 DIAGNOSIS — F419 Anxiety disorder, unspecified: Secondary | ICD-10-CM | POA: Diagnosis not present

## 2020-03-08 DIAGNOSIS — G20 Parkinson's disease: Secondary | ICD-10-CM | POA: Diagnosis not present

## 2020-03-08 DIAGNOSIS — I872 Venous insufficiency (chronic) (peripheral): Secondary | ICD-10-CM | POA: Diagnosis not present

## 2020-03-08 DIAGNOSIS — F39 Unspecified mood [affective] disorder: Secondary | ICD-10-CM | POA: Diagnosis not present

## 2020-03-08 DIAGNOSIS — M6281 Muscle weakness (generalized): Secondary | ICD-10-CM | POA: Diagnosis not present

## 2020-03-08 DIAGNOSIS — R278 Other lack of coordination: Secondary | ICD-10-CM | POA: Diagnosis not present

## 2020-03-08 DIAGNOSIS — R4189 Other symptoms and signs involving cognitive functions and awareness: Secondary | ICD-10-CM | POA: Diagnosis not present

## 2020-03-09 DIAGNOSIS — R278 Other lack of coordination: Secondary | ICD-10-CM | POA: Diagnosis not present

## 2020-03-09 DIAGNOSIS — G20 Parkinson's disease: Secondary | ICD-10-CM | POA: Diagnosis not present

## 2020-03-09 DIAGNOSIS — W19XXXD Unspecified fall, subsequent encounter: Secondary | ICD-10-CM | POA: Diagnosis not present

## 2020-03-09 DIAGNOSIS — M6281 Muscle weakness (generalized): Secondary | ICD-10-CM | POA: Diagnosis not present

## 2020-03-09 DIAGNOSIS — F39 Unspecified mood [affective] disorder: Secondary | ICD-10-CM | POA: Diagnosis not present

## 2020-03-09 DIAGNOSIS — R2681 Unsteadiness on feet: Secondary | ICD-10-CM | POA: Diagnosis not present

## 2020-03-09 DIAGNOSIS — Z741 Need for assistance with personal care: Secondary | ICD-10-CM | POA: Diagnosis not present

## 2020-03-09 DIAGNOSIS — F419 Anxiety disorder, unspecified: Secondary | ICD-10-CM | POA: Diagnosis not present

## 2020-03-09 DIAGNOSIS — R262 Difficulty in walking, not elsewhere classified: Secondary | ICD-10-CM | POA: Diagnosis not present

## 2020-03-09 DIAGNOSIS — R4189 Other symptoms and signs involving cognitive functions and awareness: Secondary | ICD-10-CM | POA: Diagnosis not present

## 2020-03-10 DIAGNOSIS — R4189 Other symptoms and signs involving cognitive functions and awareness: Secondary | ICD-10-CM | POA: Diagnosis not present

## 2020-03-10 DIAGNOSIS — G20 Parkinson's disease: Secondary | ICD-10-CM | POA: Diagnosis not present

## 2020-03-10 DIAGNOSIS — M6281 Muscle weakness (generalized): Secondary | ICD-10-CM | POA: Diagnosis not present

## 2020-03-10 DIAGNOSIS — R278 Other lack of coordination: Secondary | ICD-10-CM | POA: Diagnosis not present

## 2020-03-10 DIAGNOSIS — F419 Anxiety disorder, unspecified: Secondary | ICD-10-CM | POA: Diagnosis not present

## 2020-03-10 DIAGNOSIS — R2681 Unsteadiness on feet: Secondary | ICD-10-CM | POA: Diagnosis not present

## 2020-03-10 DIAGNOSIS — W19XXXD Unspecified fall, subsequent encounter: Secondary | ICD-10-CM | POA: Diagnosis not present

## 2020-03-10 DIAGNOSIS — Z741 Need for assistance with personal care: Secondary | ICD-10-CM | POA: Diagnosis not present

## 2020-03-10 DIAGNOSIS — R262 Difficulty in walking, not elsewhere classified: Secondary | ICD-10-CM | POA: Diagnosis not present

## 2020-03-10 DIAGNOSIS — F39 Unspecified mood [affective] disorder: Secondary | ICD-10-CM | POA: Diagnosis not present

## 2020-03-11 DIAGNOSIS — R4189 Other symptoms and signs involving cognitive functions and awareness: Secondary | ICD-10-CM | POA: Diagnosis not present

## 2020-03-11 DIAGNOSIS — R262 Difficulty in walking, not elsewhere classified: Secondary | ICD-10-CM | POA: Diagnosis not present

## 2020-03-11 DIAGNOSIS — R2681 Unsteadiness on feet: Secondary | ICD-10-CM | POA: Diagnosis not present

## 2020-03-11 DIAGNOSIS — Z741 Need for assistance with personal care: Secondary | ICD-10-CM | POA: Diagnosis not present

## 2020-03-11 DIAGNOSIS — F39 Unspecified mood [affective] disorder: Secondary | ICD-10-CM | POA: Diagnosis not present

## 2020-03-11 DIAGNOSIS — R278 Other lack of coordination: Secondary | ICD-10-CM | POA: Diagnosis not present

## 2020-03-11 DIAGNOSIS — M6281 Muscle weakness (generalized): Secondary | ICD-10-CM | POA: Diagnosis not present

## 2020-03-11 DIAGNOSIS — F419 Anxiety disorder, unspecified: Secondary | ICD-10-CM | POA: Diagnosis not present

## 2020-03-11 DIAGNOSIS — G20 Parkinson's disease: Secondary | ICD-10-CM | POA: Diagnosis not present

## 2020-03-11 DIAGNOSIS — W19XXXD Unspecified fall, subsequent encounter: Secondary | ICD-10-CM | POA: Diagnosis not present

## 2020-03-14 DIAGNOSIS — F419 Anxiety disorder, unspecified: Secondary | ICD-10-CM | POA: Diagnosis not present

## 2020-03-14 DIAGNOSIS — R4189 Other symptoms and signs involving cognitive functions and awareness: Secondary | ICD-10-CM | POA: Diagnosis not present

## 2020-03-14 DIAGNOSIS — I872 Venous insufficiency (chronic) (peripheral): Secondary | ICD-10-CM | POA: Diagnosis not present

## 2020-03-14 DIAGNOSIS — M6281 Muscle weakness (generalized): Secondary | ICD-10-CM | POA: Diagnosis not present

## 2020-03-14 DIAGNOSIS — G20 Parkinson's disease: Secondary | ICD-10-CM | POA: Diagnosis not present

## 2020-03-14 DIAGNOSIS — F39 Unspecified mood [affective] disorder: Secondary | ICD-10-CM | POA: Diagnosis not present

## 2020-03-14 DIAGNOSIS — R2681 Unsteadiness on feet: Secondary | ICD-10-CM | POA: Diagnosis not present

## 2020-03-14 DIAGNOSIS — Z741 Need for assistance with personal care: Secondary | ICD-10-CM | POA: Diagnosis not present

## 2020-03-14 DIAGNOSIS — W19XXXD Unspecified fall, subsequent encounter: Secondary | ICD-10-CM | POA: Diagnosis not present

## 2020-03-14 DIAGNOSIS — R262 Difficulty in walking, not elsewhere classified: Secondary | ICD-10-CM | POA: Diagnosis not present

## 2020-03-14 DIAGNOSIS — R278 Other lack of coordination: Secondary | ICD-10-CM | POA: Diagnosis not present

## 2020-03-15 DIAGNOSIS — R4189 Other symptoms and signs involving cognitive functions and awareness: Secondary | ICD-10-CM | POA: Diagnosis not present

## 2020-03-15 DIAGNOSIS — R262 Difficulty in walking, not elsewhere classified: Secondary | ICD-10-CM | POA: Diagnosis not present

## 2020-03-15 DIAGNOSIS — F39 Unspecified mood [affective] disorder: Secondary | ICD-10-CM | POA: Diagnosis not present

## 2020-03-15 DIAGNOSIS — F419 Anxiety disorder, unspecified: Secondary | ICD-10-CM | POA: Diagnosis not present

## 2020-03-15 DIAGNOSIS — L03119 Cellulitis of unspecified part of limb: Secondary | ICD-10-CM | POA: Diagnosis not present

## 2020-03-15 DIAGNOSIS — R2681 Unsteadiness on feet: Secondary | ICD-10-CM | POA: Diagnosis not present

## 2020-03-15 DIAGNOSIS — M6281 Muscle weakness (generalized): Secondary | ICD-10-CM | POA: Diagnosis not present

## 2020-03-15 DIAGNOSIS — R278 Other lack of coordination: Secondary | ICD-10-CM | POA: Diagnosis not present

## 2020-03-15 DIAGNOSIS — G20 Parkinson's disease: Secondary | ICD-10-CM | POA: Diagnosis not present

## 2020-03-15 DIAGNOSIS — W19XXXD Unspecified fall, subsequent encounter: Secondary | ICD-10-CM | POA: Diagnosis not present

## 2020-03-15 DIAGNOSIS — Z741 Need for assistance with personal care: Secondary | ICD-10-CM | POA: Diagnosis not present

## 2020-03-16 DIAGNOSIS — Z741 Need for assistance with personal care: Secondary | ICD-10-CM | POA: Diagnosis not present

## 2020-03-16 DIAGNOSIS — G20 Parkinson's disease: Secondary | ICD-10-CM | POA: Diagnosis not present

## 2020-03-16 DIAGNOSIS — M6281 Muscle weakness (generalized): Secondary | ICD-10-CM | POA: Diagnosis not present

## 2020-03-16 DIAGNOSIS — F39 Unspecified mood [affective] disorder: Secondary | ICD-10-CM | POA: Diagnosis not present

## 2020-03-16 DIAGNOSIS — R262 Difficulty in walking, not elsewhere classified: Secondary | ICD-10-CM | POA: Diagnosis not present

## 2020-03-16 DIAGNOSIS — R2681 Unsteadiness on feet: Secondary | ICD-10-CM | POA: Diagnosis not present

## 2020-03-16 DIAGNOSIS — R4189 Other symptoms and signs involving cognitive functions and awareness: Secondary | ICD-10-CM | POA: Diagnosis not present

## 2020-03-16 DIAGNOSIS — R278 Other lack of coordination: Secondary | ICD-10-CM | POA: Diagnosis not present

## 2020-03-16 DIAGNOSIS — F419 Anxiety disorder, unspecified: Secondary | ICD-10-CM | POA: Diagnosis not present

## 2020-03-16 DIAGNOSIS — W19XXXD Unspecified fall, subsequent encounter: Secondary | ICD-10-CM | POA: Diagnosis not present

## 2020-03-17 DIAGNOSIS — R262 Difficulty in walking, not elsewhere classified: Secondary | ICD-10-CM | POA: Diagnosis not present

## 2020-03-17 DIAGNOSIS — R2681 Unsteadiness on feet: Secondary | ICD-10-CM | POA: Diagnosis not present

## 2020-03-17 DIAGNOSIS — G20 Parkinson's disease: Secondary | ICD-10-CM | POA: Diagnosis not present

## 2020-03-17 DIAGNOSIS — W19XXXD Unspecified fall, subsequent encounter: Secondary | ICD-10-CM | POA: Diagnosis not present

## 2020-03-17 DIAGNOSIS — Z741 Need for assistance with personal care: Secondary | ICD-10-CM | POA: Diagnosis not present

## 2020-03-17 DIAGNOSIS — M6281 Muscle weakness (generalized): Secondary | ICD-10-CM | POA: Diagnosis not present

## 2020-03-17 DIAGNOSIS — R4189 Other symptoms and signs involving cognitive functions and awareness: Secondary | ICD-10-CM | POA: Diagnosis not present

## 2020-03-17 DIAGNOSIS — F39 Unspecified mood [affective] disorder: Secondary | ICD-10-CM | POA: Diagnosis not present

## 2020-03-17 DIAGNOSIS — R278 Other lack of coordination: Secondary | ICD-10-CM | POA: Diagnosis not present

## 2020-03-17 DIAGNOSIS — F419 Anxiety disorder, unspecified: Secondary | ICD-10-CM | POA: Diagnosis not present

## 2020-03-18 DIAGNOSIS — F419 Anxiety disorder, unspecified: Secondary | ICD-10-CM | POA: Diagnosis not present

## 2020-03-18 DIAGNOSIS — Z741 Need for assistance with personal care: Secondary | ICD-10-CM | POA: Diagnosis not present

## 2020-03-18 DIAGNOSIS — R262 Difficulty in walking, not elsewhere classified: Secondary | ICD-10-CM | POA: Diagnosis not present

## 2020-03-18 DIAGNOSIS — R2681 Unsteadiness on feet: Secondary | ICD-10-CM | POA: Diagnosis not present

## 2020-03-18 DIAGNOSIS — G20 Parkinson's disease: Secondary | ICD-10-CM | POA: Diagnosis not present

## 2020-03-18 DIAGNOSIS — R278 Other lack of coordination: Secondary | ICD-10-CM | POA: Diagnosis not present

## 2020-03-18 DIAGNOSIS — M6281 Muscle weakness (generalized): Secondary | ICD-10-CM | POA: Diagnosis not present

## 2020-03-18 DIAGNOSIS — F39 Unspecified mood [affective] disorder: Secondary | ICD-10-CM | POA: Diagnosis not present

## 2020-03-18 DIAGNOSIS — W19XXXD Unspecified fall, subsequent encounter: Secondary | ICD-10-CM | POA: Diagnosis not present

## 2020-03-18 DIAGNOSIS — R4189 Other symptoms and signs involving cognitive functions and awareness: Secondary | ICD-10-CM | POA: Diagnosis not present

## 2020-03-21 DIAGNOSIS — R2681 Unsteadiness on feet: Secondary | ICD-10-CM | POA: Diagnosis not present

## 2020-03-21 DIAGNOSIS — R262 Difficulty in walking, not elsewhere classified: Secondary | ICD-10-CM | POA: Diagnosis not present

## 2020-03-21 DIAGNOSIS — M6281 Muscle weakness (generalized): Secondary | ICD-10-CM | POA: Diagnosis not present

## 2020-03-21 DIAGNOSIS — R4189 Other symptoms and signs involving cognitive functions and awareness: Secondary | ICD-10-CM | POA: Diagnosis not present

## 2020-03-21 DIAGNOSIS — G20 Parkinson's disease: Secondary | ICD-10-CM | POA: Diagnosis not present

## 2020-03-21 DIAGNOSIS — W19XXXD Unspecified fall, subsequent encounter: Secondary | ICD-10-CM | POA: Diagnosis not present

## 2020-03-21 DIAGNOSIS — F39 Unspecified mood [affective] disorder: Secondary | ICD-10-CM | POA: Diagnosis not present

## 2020-03-21 DIAGNOSIS — Z741 Need for assistance with personal care: Secondary | ICD-10-CM | POA: Diagnosis not present

## 2020-03-21 DIAGNOSIS — F419 Anxiety disorder, unspecified: Secondary | ICD-10-CM | POA: Diagnosis not present

## 2020-03-21 DIAGNOSIS — R278 Other lack of coordination: Secondary | ICD-10-CM | POA: Diagnosis not present

## 2020-03-22 DIAGNOSIS — G20 Parkinson's disease: Secondary | ICD-10-CM | POA: Diagnosis not present

## 2020-03-22 DIAGNOSIS — F39 Unspecified mood [affective] disorder: Secondary | ICD-10-CM | POA: Diagnosis not present

## 2020-03-22 DIAGNOSIS — W19XXXD Unspecified fall, subsequent encounter: Secondary | ICD-10-CM | POA: Diagnosis not present

## 2020-03-22 DIAGNOSIS — M6281 Muscle weakness (generalized): Secondary | ICD-10-CM | POA: Diagnosis not present

## 2020-03-22 DIAGNOSIS — R4189 Other symptoms and signs involving cognitive functions and awareness: Secondary | ICD-10-CM | POA: Diagnosis not present

## 2020-03-22 DIAGNOSIS — R262 Difficulty in walking, not elsewhere classified: Secondary | ICD-10-CM | POA: Diagnosis not present

## 2020-03-22 DIAGNOSIS — F419 Anxiety disorder, unspecified: Secondary | ICD-10-CM | POA: Diagnosis not present

## 2020-03-22 DIAGNOSIS — R2681 Unsteadiness on feet: Secondary | ICD-10-CM | POA: Diagnosis not present

## 2020-03-22 DIAGNOSIS — Z741 Need for assistance with personal care: Secondary | ICD-10-CM | POA: Diagnosis not present

## 2020-03-22 DIAGNOSIS — R278 Other lack of coordination: Secondary | ICD-10-CM | POA: Diagnosis not present

## 2020-03-31 DIAGNOSIS — I872 Venous insufficiency (chronic) (peripheral): Secondary | ICD-10-CM | POA: Diagnosis not present

## 2020-04-06 DIAGNOSIS — I872 Venous insufficiency (chronic) (peripheral): Secondary | ICD-10-CM | POA: Diagnosis not present

## 2020-04-06 DIAGNOSIS — N401 Enlarged prostate with lower urinary tract symptoms: Secondary | ICD-10-CM | POA: Diagnosis not present

## 2020-04-06 DIAGNOSIS — F39 Unspecified mood [affective] disorder: Secondary | ICD-10-CM | POA: Diagnosis not present

## 2020-04-06 DIAGNOSIS — G20 Parkinson's disease: Secondary | ICD-10-CM | POA: Diagnosis not present

## 2020-07-01 DIAGNOSIS — K219 Gastro-esophageal reflux disease without esophagitis: Secondary | ICD-10-CM

## 2020-07-01 DIAGNOSIS — J45909 Unspecified asthma, uncomplicated: Secondary | ICD-10-CM

## 2020-07-01 DIAGNOSIS — F39 Unspecified mood [affective] disorder: Secondary | ICD-10-CM | POA: Diagnosis not present

## 2020-07-01 DIAGNOSIS — I872 Venous insufficiency (chronic) (peripheral): Secondary | ICD-10-CM | POA: Diagnosis not present

## 2020-07-01 DIAGNOSIS — N401 Enlarged prostate with lower urinary tract symptoms: Secondary | ICD-10-CM | POA: Diagnosis not present

## 2020-07-01 DIAGNOSIS — G20 Parkinson's disease: Secondary | ICD-10-CM | POA: Diagnosis not present

## 2020-09-12 DIAGNOSIS — J45909 Unspecified asthma, uncomplicated: Secondary | ICD-10-CM | POA: Diagnosis not present

## 2020-09-12 DIAGNOSIS — G20 Parkinson's disease: Secondary | ICD-10-CM | POA: Diagnosis not present

## 2020-09-12 DIAGNOSIS — K219 Gastro-esophageal reflux disease without esophagitis: Secondary | ICD-10-CM

## 2020-09-12 DIAGNOSIS — N400 Enlarged prostate without lower urinary tract symptoms: Secondary | ICD-10-CM

## 2020-09-12 DIAGNOSIS — I872 Venous insufficiency (chronic) (peripheral): Secondary | ICD-10-CM | POA: Diagnosis not present

## 2020-09-12 DIAGNOSIS — F39 Unspecified mood [affective] disorder: Secondary | ICD-10-CM | POA: Diagnosis not present

## 2020-10-28 DIAGNOSIS — F39 Unspecified mood [affective] disorder: Secondary | ICD-10-CM | POA: Diagnosis not present

## 2020-10-28 DIAGNOSIS — I872 Venous insufficiency (chronic) (peripheral): Secondary | ICD-10-CM | POA: Diagnosis not present

## 2020-10-28 DIAGNOSIS — G20 Parkinson's disease: Secondary | ICD-10-CM | POA: Diagnosis not present

## 2020-10-28 DIAGNOSIS — N401 Enlarged prostate with lower urinary tract symptoms: Secondary | ICD-10-CM | POA: Diagnosis not present

## 2020-10-28 DIAGNOSIS — K219 Gastro-esophageal reflux disease without esophagitis: Secondary | ICD-10-CM

## 2020-11-15 DIAGNOSIS — H1130 Conjunctival hemorrhage, unspecified eye: Secondary | ICD-10-CM | POA: Diagnosis not present

## 2020-12-22 DIAGNOSIS — G20 Parkinson's disease: Secondary | ICD-10-CM | POA: Diagnosis not present

## 2020-12-22 DIAGNOSIS — J45909 Unspecified asthma, uncomplicated: Secondary | ICD-10-CM | POA: Diagnosis not present

## 2020-12-22 DIAGNOSIS — I872 Venous insufficiency (chronic) (peripheral): Secondary | ICD-10-CM | POA: Diagnosis not present

## 2020-12-22 DIAGNOSIS — K219 Gastro-esophageal reflux disease without esophagitis: Secondary | ICD-10-CM

## 2020-12-22 DIAGNOSIS — N400 Enlarged prostate without lower urinary tract symptoms: Secondary | ICD-10-CM

## 2020-12-22 DIAGNOSIS — F39 Unspecified mood [affective] disorder: Secondary | ICD-10-CM | POA: Diagnosis not present

## 2021-02-06 DIAGNOSIS — L03116 Cellulitis of left lower limb: Secondary | ICD-10-CM | POA: Diagnosis not present

## 2021-02-08 DIAGNOSIS — D692 Other nonthrombocytopenic purpura: Secondary | ICD-10-CM | POA: Diagnosis not present

## 2021-02-08 DIAGNOSIS — I872 Venous insufficiency (chronic) (peripheral): Secondary | ICD-10-CM | POA: Diagnosis not present

## 2021-02-08 DIAGNOSIS — L570 Actinic keratosis: Secondary | ICD-10-CM | POA: Diagnosis not present

## 2021-02-08 DIAGNOSIS — L0390 Cellulitis, unspecified: Secondary | ICD-10-CM | POA: Diagnosis not present

## 2021-02-10 DIAGNOSIS — L03116 Cellulitis of left lower limb: Secondary | ICD-10-CM | POA: Diagnosis not present

## 2021-02-23 DIAGNOSIS — I871 Compression of vein: Secondary | ICD-10-CM | POA: Diagnosis not present

## 2021-02-24 DIAGNOSIS — L270 Generalized skin eruption due to drugs and medicaments taken internally: Secondary | ICD-10-CM | POA: Diagnosis not present

## 2021-02-28 DIAGNOSIS — L03116 Cellulitis of left lower limb: Secondary | ICD-10-CM | POA: Diagnosis not present

## 2021-03-08 DIAGNOSIS — G20 Parkinson's disease: Secondary | ICD-10-CM

## 2021-03-08 DIAGNOSIS — F39 Unspecified mood [affective] disorder: Secondary | ICD-10-CM | POA: Diagnosis not present

## 2021-03-08 DIAGNOSIS — K219 Gastro-esophageal reflux disease without esophagitis: Secondary | ICD-10-CM | POA: Diagnosis not present

## 2021-03-08 DIAGNOSIS — I872 Venous insufficiency (chronic) (peripheral): Secondary | ICD-10-CM | POA: Diagnosis not present

## 2021-03-08 DIAGNOSIS — N401 Enlarged prostate with lower urinary tract symptoms: Secondary | ICD-10-CM | POA: Diagnosis not present

## 2021-03-08 DIAGNOSIS — J45909 Unspecified asthma, uncomplicated: Secondary | ICD-10-CM

## 2021-05-01 DIAGNOSIS — F39 Unspecified mood [affective] disorder: Secondary | ICD-10-CM | POA: Diagnosis not present

## 2021-05-01 DIAGNOSIS — N400 Enlarged prostate without lower urinary tract symptoms: Secondary | ICD-10-CM | POA: Diagnosis not present

## 2021-05-01 DIAGNOSIS — K219 Gastro-esophageal reflux disease without esophagitis: Secondary | ICD-10-CM

## 2021-05-01 DIAGNOSIS — I872 Venous insufficiency (chronic) (peripheral): Secondary | ICD-10-CM | POA: Diagnosis not present

## 2021-05-01 DIAGNOSIS — G20 Parkinson's disease: Secondary | ICD-10-CM | POA: Diagnosis not present

## 2021-05-17 DIAGNOSIS — B351 Tinea unguium: Secondary | ICD-10-CM | POA: Diagnosis not present

## 2021-05-25 DIAGNOSIS — L03116 Cellulitis of left lower limb: Secondary | ICD-10-CM

## 2021-05-29 DIAGNOSIS — R609 Edema, unspecified: Secondary | ICD-10-CM | POA: Diagnosis not present

## 2021-05-30 ENCOUNTER — Other Ambulatory Visit: Payer: Self-pay

## 2021-05-30 ENCOUNTER — Emergency Department: Payer: Medicare Other

## 2021-05-30 ENCOUNTER — Inpatient Hospital Stay
Admission: EM | Admit: 2021-05-30 | Discharge: 2021-06-01 | DRG: 603 | Disposition: A | Discharge status: Skilled Nursing Facility | Payer: Medicare Other | Source: Skilled Nursing Facility | Attending: Family Medicine | Admitting: Family Medicine

## 2021-05-30 DIAGNOSIS — Z20822 Contact with and (suspected) exposure to covid-19: Secondary | ICD-10-CM | POA: Diagnosis present

## 2021-05-30 DIAGNOSIS — F411 Generalized anxiety disorder: Secondary | ICD-10-CM | POA: Diagnosis not present

## 2021-05-30 DIAGNOSIS — I872 Venous insufficiency (chronic) (peripheral): Secondary | ICD-10-CM | POA: Diagnosis present

## 2021-05-30 DIAGNOSIS — I10 Essential (primary) hypertension: Secondary | ICD-10-CM | POA: Diagnosis not present

## 2021-05-30 DIAGNOSIS — Z79899 Other long term (current) drug therapy: Secondary | ICD-10-CM | POA: Diagnosis not present

## 2021-05-30 DIAGNOSIS — M7989 Other specified soft tissue disorders: Secondary | ICD-10-CM | POA: Diagnosis not present

## 2021-05-30 DIAGNOSIS — F32A Depression, unspecified: Secondary | ICD-10-CM | POA: Diagnosis present

## 2021-05-30 DIAGNOSIS — R531 Weakness: Secondary | ICD-10-CM | POA: Diagnosis not present

## 2021-05-30 DIAGNOSIS — I4510 Unspecified right bundle-branch block: Secondary | ICD-10-CM | POA: Diagnosis present

## 2021-05-30 DIAGNOSIS — N400 Enlarged prostate without lower urinary tract symptoms: Secondary | ICD-10-CM | POA: Diagnosis present

## 2021-05-30 DIAGNOSIS — G20 Parkinson's disease: Secondary | ICD-10-CM | POA: Diagnosis present

## 2021-05-30 DIAGNOSIS — R5381 Other malaise: Secondary | ICD-10-CM | POA: Diagnosis not present

## 2021-05-30 DIAGNOSIS — E871 Hypo-osmolality and hyponatremia: Secondary | ICD-10-CM | POA: Diagnosis present

## 2021-05-30 DIAGNOSIS — L03116 Cellulitis of left lower limb: Principal | ICD-10-CM | POA: Diagnosis present

## 2021-05-30 DIAGNOSIS — L0390 Cellulitis, unspecified: Secondary | ICD-10-CM | POA: Diagnosis present

## 2021-05-30 DIAGNOSIS — K219 Gastro-esophageal reflux disease without esophagitis: Secondary | ICD-10-CM | POA: Diagnosis present

## 2021-05-30 DIAGNOSIS — Z743 Need for continuous supervision: Secondary | ICD-10-CM | POA: Diagnosis not present

## 2021-05-30 DIAGNOSIS — G20B2 Parkinson's disease with dyskinesia, with fluctuations: Secondary | ICD-10-CM | POA: Diagnosis present

## 2021-05-30 DIAGNOSIS — R609 Edema, unspecified: Secondary | ICD-10-CM | POA: Diagnosis not present

## 2021-05-30 LAB — COMPREHENSIVE METABOLIC PANEL
ALT: 10 U/L (ref 0–44)
AST: 25 U/L (ref 15–41)
Albumin: 3.7 g/dL (ref 3.5–5.0)
Alkaline Phosphatase: 97 U/L (ref 38–126)
Anion gap: 6 (ref 5–15)
BUN: 22 mg/dL (ref 8–23)
CO2: 25 mmol/L (ref 22–32)
Calcium: 8.6 mg/dL — ABNORMAL LOW (ref 8.9–10.3)
Chloride: 101 mmol/L (ref 98–111)
Creatinine, Ser: 0.71 mg/dL (ref 0.61–1.24)
GFR, Estimated: >60 mL/min (ref >60)
Glucose, Bld: 140 mg/dL — ABNORMAL HIGH (ref 70–99)
Potassium: 3.5 mmol/L (ref 3.5–5.1)
Sodium: 132 mmol/L — ABNORMAL LOW (ref 135–145)
Total Bilirubin: 0.8 mg/dL (ref 0.3–1.2)
Total Protein: 6.6 g/dL (ref 6.5–8.1)

## 2021-05-30 LAB — CBC WITH DIFFERENTIAL/PLATELET
Abs Immature Granulocytes: 0.02 10*3/uL (ref 0.00–0.07)
Basophils Absolute: 0 10*3/uL (ref 0.0–0.1)
Basophils Relative: 0 %
Eosinophils Absolute: 0 10*3/uL (ref 0.0–0.5)
Eosinophils Relative: 1 %
HCT: 38.9 % — ABNORMAL LOW (ref 39.0–52.0)
Hemoglobin: 13.5 g/dL (ref 13.0–17.0)
Immature Granulocytes: 0 %
Lymphocytes Relative: 19 %
Lymphs Abs: 1.4 10*3/uL (ref 0.7–4.0)
MCH: 31 pg (ref 26.0–34.0)
MCHC: 34.7 g/dL (ref 30.0–36.0)
MCV: 89.2 fL (ref 80.0–100.0)
Monocytes Absolute: 0.6 10*3/uL (ref 0.1–1.0)
Monocytes Relative: 8 %
Neutro Abs: 5.3 10*3/uL (ref 1.7–7.7)
Neutrophils Relative %: 72 %
Platelets: 251 10*3/uL (ref 150–400)
RBC: 4.36 MIL/uL (ref 4.22–5.81)
RDW: 14 % (ref 11.5–15.5)
WBC: 7.3 10*3/uL (ref 4.0–10.5)
nRBC: 0 % (ref 0.0–0.2)

## 2021-05-30 LAB — RESP PANEL BY RT-PCR (FLU A&B, COVID) ARPGX2
Influenza A by PCR: NEGATIVE
Influenza B by PCR: NEGATIVE
SARS Coronavirus 2 by RT PCR: NEGATIVE

## 2021-05-30 LAB — MRSA NEXT GEN BY PCR, NASAL: MRSA by PCR Next Gen: NOT DETECTED

## 2021-05-30 MED ORDER — SODIUM CHLORIDE 0.9% FLUSH
3.0000 mL | Freq: Two times a day (BID) | INTRAVENOUS | Status: DC
  Administered 2021-05-30 – 2021-06-01 (×4): 3 mL via INTRAVENOUS

## 2021-05-30 MED ORDER — CARBIDOPA-LEVODOPA 25-100 MG PO TABS: 2.0000 | ORAL_TABLET | Freq: Four times a day (QID) | ORAL | Status: DC

## 2021-05-30 MED ORDER — SENNOSIDES-DOCUSATE SODIUM 8.6-50 MG PO TABS: 1.0000 | ORAL_TABLET | Freq: Every evening | ORAL | Status: DC | PRN

## 2021-05-30 MED ORDER — VANCOMYCIN HCL 750 MG/150ML IV SOLN
750.0000 mg | Freq: Once | INTRAVENOUS | Status: AC
  Administered 2021-05-30: 750 mg via INTRAVENOUS
  Filled 2021-05-30: qty 150

## 2021-05-30 MED ORDER — ALUM & MAG HYDROXIDE-SIMETH 200-200-20 MG/5ML PO SUSP: 30.0000 mL | Freq: Four times a day (QID) | ORAL | Status: DC | PRN

## 2021-05-30 MED ORDER — SODIUM CHLORIDE 0.9 % IV SOLN
2.0000 g | Freq: Once | INTRAVENOUS | Status: AC
  Administered 2021-05-30: 2 g via INTRAVENOUS
  Filled 2021-05-30: qty 2

## 2021-05-30 MED ORDER — VANCOMYCIN HCL 1500 MG/300ML IV SOLN
1500.0000 mg | INTRAVENOUS | Status: DC
  Administered 2021-05-31 – 2021-06-01 (×2): 1500 mg via INTRAVENOUS
  Filled 2021-05-30 (×2): qty 300

## 2021-05-30 MED ORDER — METRONIDAZOLE 500 MG/100ML IV SOLN
500.0000 mg | Freq: Once | INTRAVENOUS | Status: AC
  Administered 2021-05-30: 500 mg via INTRAVENOUS
  Filled 2021-05-30: qty 100

## 2021-05-30 MED ORDER — CITALOPRAM HYDROBROMIDE 10 MG PO TABS
10.0000 mg | ORAL_TABLET | ORAL | Status: DC
  Administered 2021-05-31: 11:00:00 10 mg via ORAL
  Filled 2021-05-30: qty 1

## 2021-05-30 MED ORDER — ACETAMINOPHEN 650 MG RE SUPP: 650.0000 mg | Freq: Four times a day (QID) | RECTAL | Status: DC | PRN

## 2021-05-30 MED ORDER — VANCOMYCIN HCL IN DEXTROSE 1-5 GM/200ML-% IV SOLN
1000.0000 mg | Freq: Once | INTRAVENOUS | Status: AC
  Administered 2021-05-30: 1000 mg via INTRAVENOUS
  Filled 2021-05-30: qty 200

## 2021-05-30 MED ORDER — FLUTICASONE PROPIONATE 50 MCG/ACT NA SUSP
2.0000 | Freq: Every day | NASAL | Status: DC
  Administered 2021-05-31 – 2021-06-01 (×2): 2 via NASAL
  Filled 2021-05-30: qty 16

## 2021-05-30 MED ORDER — LORATADINE 10 MG PO TABS
10.0000 mg | ORAL_TABLET | Freq: Every day | ORAL | Status: DC
  Administered 2021-05-31 – 2021-06-01 (×2): 10 mg via ORAL
  Filled 2021-05-30 (×2): qty 1

## 2021-05-30 MED ORDER — SODIUM CHLORIDE 0.9% FLUSH: 3.0000 mL | INTRAVENOUS | Status: DC | PRN

## 2021-05-30 MED ORDER — ENOXAPARIN SODIUM 40 MG/0.4ML IJ SOSY
40.0000 mg | PREFILLED_SYRINGE | INTRAMUSCULAR | Status: DC
  Administered 2021-05-30 – 2021-05-31 (×2): 40 mg via SUBCUTANEOUS
  Filled 2021-05-30 (×2): qty 0.4

## 2021-05-30 MED ORDER — ALBUTEROL SULFATE (2.5 MG/3ML) 0.083% IN NEBU: 2.5000 mg | INHALATION_SOLUTION | Freq: Three times a day (TID) | RESPIRATORY_TRACT | Status: DC | PRN

## 2021-05-30 MED ORDER — CARBIDOPA-LEVODOPA ER 50-200 MG PO TBCR
1.0000 | EXTENDED_RELEASE_TABLET | Freq: Three times a day (TID) | ORAL | Status: DC
  Filled 2021-05-30: qty 1

## 2021-05-30 MED ORDER — TAMSULOSIN HCL 0.4 MG PO CAPS
0.4000 mg | ORAL_CAPSULE | Freq: Every day | ORAL | Status: DC
  Administered 2021-05-31 – 2021-06-01 (×2): 0.4 mg via ORAL
  Filled 2021-05-30 (×2): qty 1

## 2021-05-30 MED ORDER — ACETAMINOPHEN 325 MG PO TABS: 650.0000 mg | ORAL_TABLET | Freq: Four times a day (QID) | ORAL | Status: DC | PRN

## 2021-05-30 MED ORDER — ONDANSETRON HCL 4 MG PO TABS: 4.0000 mg | ORAL_TABLET | Freq: Four times a day (QID) | ORAL | Status: DC | PRN

## 2021-05-30 MED ORDER — PANTOPRAZOLE SODIUM 40 MG PO TBEC
40.0000 mg | DELAYED_RELEASE_TABLET | Freq: Every day | ORAL | Status: DC
  Administered 2021-05-31 – 2021-06-01 (×2): 40 mg via ORAL
  Filled 2021-05-30 (×2): qty 1

## 2021-05-30 MED ORDER — MOMETASONE FURO-FORMOTEROL FUM 200-5 MCG/ACT IN AERO
2.0000 | INHALATION_SPRAY | Freq: Two times a day (BID) | RESPIRATORY_TRACT | Status: DC
  Administered 2021-05-30 – 2021-06-01 (×4): 2 via RESPIRATORY_TRACT
  Filled 2021-05-30: qty 8.8

## 2021-05-30 MED ORDER — SODIUM CHLORIDE 0.9 % IV SOLN
250.0000 mL | INTRAVENOUS | Status: DC | PRN
  Administered 2021-05-31: 11:00:00 250 mL via INTRAVENOUS

## 2021-05-30 MED ORDER — CARBIDOPA-LEVODOPA ER 50-200 MG PO TBCR
1.0000 | EXTENDED_RELEASE_TABLET | Freq: Three times a day (TID) | ORAL | Status: DC
  Administered 2021-05-30: 1 via ORAL
  Filled 2021-05-30 (×3): qty 1

## 2021-05-30 MED ORDER — MAGNESIUM HYDROXIDE 400 MG/5ML PO SUSP: 30.0000 mL | Freq: Every day | ORAL | Status: DC | PRN

## 2021-05-30 MED ORDER — GUAIFENESIN ER 600 MG PO TB12
600.0000 mg | ORAL_TABLET | Freq: Two times a day (BID) | ORAL | Status: DC
  Administered 2021-05-30 – 2021-06-01 (×4): 600 mg via ORAL
  Filled 2021-05-30 (×4): qty 1

## 2021-05-30 MED ORDER — CARBIDOPA-LEVODOPA ER 50-200 MG PO TBCR
1.0000 | EXTENDED_RELEASE_TABLET | Freq: Three times a day (TID) | ORAL | Status: DC
  Administered 2021-05-30 – 2021-06-01 (×6): 1 via ORAL
  Filled 2021-05-30 (×8): qty 1

## 2021-05-30 MED ORDER — ONDANSETRON HCL 4 MG/2ML IJ SOLN: 4.0000 mg | Freq: Four times a day (QID) | INTRAMUSCULAR | Status: DC | PRN

## 2021-05-30 MED ORDER — CARBIDOPA-LEVODOPA 25-100 MG PO TABS: 1.0000 | ORAL_TABLET | Freq: Three times a day (TID) | ORAL | Status: DC

## 2021-05-30 MED ORDER — TORSEMIDE 20 MG PO TABS
20.0000 mg | ORAL_TABLET | Freq: Every day | ORAL | Status: DC
  Administered 2021-05-31 – 2021-06-01 (×2): 20 mg via ORAL
  Filled 2021-05-30 (×2): qty 1

## 2021-05-30 MED ORDER — ALBUTEROL SULFATE HFA 108 (90 BASE) MCG/ACT IN AERS: 2.0000 | INHALATION_SPRAY | Freq: Three times a day (TID) | RESPIRATORY_TRACT | Status: DC | PRN

## 2021-05-30 MED ORDER — VANCOMYCIN HCL IN DEXTROSE 1-5 GM/200ML-% IV SOLN
1000.0000 mg | Freq: Once | INTRAVENOUS | Status: DC
Start: 2021-05-30 — End: 2021-05-30

## 2021-05-30 MED ORDER — MONTELUKAST SODIUM 10 MG PO TABS
10.0000 mg | ORAL_TABLET | Freq: Every day | ORAL | Status: DC
  Administered 2021-05-30 – 2021-05-31 (×2): 10 mg via ORAL
  Filled 2021-05-30 (×2): qty 1

## 2021-05-30 MED ORDER — SODIUM CHLORIDE 0.9 % IV BOLUS
1000.0000 mL | Freq: Once | INTRAVENOUS | Status: AC
  Administered 2021-05-30: 1000 mL via INTRAVENOUS

## 2021-05-30 NOTE — Consult Note (Signed)
PHARMACY -  BRIEF ANTIBIOTIC NOTE   Pharmacy has received consult(s) for Vancomycin and Cefepime from an ED provider.  The patient's profile has been reviewed for ht/wt/allergies/indication/available labs.    One time order(s) placed for Vancomycin 1750mg  & cefepime 2gm  Further antibiotics/pharmacy consults should be ordered by admitting physician if indicated.                       Thank you, Parrie Rasco Rodriguez-Guzman PharmD, BCPS 05/30/2021 9:39 AM

## 2021-05-30 NOTE — Consult Note (Signed)
Pharmacy Antibiotic Note  Brandon Calhoun is a 87 y.o. male admitted on 05/30/2021 with cellulitis. Appears patient failed outpatient antibiotics with Keflex and ceftriaxone. Pharmacy has been consulted for vancomycin dosing.  Plan:  Vancomycin 1.75 g IV LD followed by maintenance regimen of vancomycin 1.5 g IV q24h --Calculated AUC: 479, Cmin 10.3 --Daily Scr per protocol --Levels at steady state as clinically indicated   Height: 5\' 9"  (175.3 cm) Weight: 77.1 kg (169 lb 15.6 oz) IBW/kg (Calculated) : 70.7  Temp (24hrs), Avg:98 F (36.7 C), Min:98 F (36.7 C), Max:98 F (36.7 C)  Recent Labs  Lab 05/30/21 0932  WBC 7.3  CREATININE 0.71    Estimated Creatinine Clearance: 62.6 mL/min (by C-G formula based on SCr of 0.71 mg/dL).    No Known Allergies  Antimicrobials this admission: Cefepime 12/13 x 1 Metronidazole 12/13 x 1  Vancomycin 12/13 >>   Dose adjustments this admission: N/A  Microbiology results: 12/13 BCx: pending  Thank you for allowing pharmacy to be a part of this patients care.  1/14 05/30/2021 11:32 AM

## 2021-05-30 NOTE — ED Triage Notes (Signed)
Pt to ED via [facility] from [facility]. Pt with hx cellulitis. Pt has been taking rocephin for the past 2 days and was on keflex prior to that. Facility called due to worsening pain, swelling and redness and migration into groin.VSS in route.

## 2021-05-30 NOTE — H&P (Addendum)
[facility] History and Physical  Brandon Calhoun [id number] DOB: [birth year] DOA: 05/30/2021  Referring physician: ED  PCP: Venia Carbon, MD   Patient is coming from: Skilled nursing facility  Chief Complaint: Increasing redness in the leg  HPI: Brandon Calhoun is a 87 y.o. male with past medical history of chronic venous insufficiency, GERD, hypertension, chronic anxiety from skilled nursing facility presented to hospital with worsening redness swelling of his left lower extremity.  Of note patient was treated with Ancef and Rocephin at the skilled nursing facility but despite that he continued to have increased swelling with redness which also progressed to the lower thigh and dorsum of the foot.  Patient denies any fever chills or rigor.  Complains of mild discomfort on the leg site.  He does have baseline chronic venous insufficiency but this is different than his usual situation.  Patient denies any trauma to the legs.  He does have history of Parkinson's disease but he is ambulatory at baseline and sleeps in the recliner.  Patient denies any nausea vomiting or diarrhea.  Denies any urinary urgency frequency or dysuria.  Denies any sick contacts or recent travel.  Denies any syncope.  Patient was then sent from the skilled nursing facility for failed outpatient treatment  ED Course: In the ED, patient was noted to have gross left lower extremity redness swelling and tenderness.  In the ED, patient received vancomycin cefepime and Flagyl and was considered for admission to hospital for further evaluation and treatment.  Review of Systems:  All systems were reviewed and were negative unless otherwise mentioned in the HPI  Past Medical History:  Diagnosis Date   Asthma    Edema    MILD OF FEET   GERD (gastroesophageal reflux disease)    Hypertension    Mood disorder ([place])    chronic anxiety   Past Surgical History:  Procedure Laterality Date   CATARACT  EXTRACTION W/PHACO Left 02/16/2016   Procedure: CATARACT EXTRACTION PHACO AND INTRAOCULAR LENS PLACEMENT ([place]);  Surgeon: Eulogio Bear, MD;  Location: [facility] ORS;  Service: Ophthalmology;  Laterality: Left;  [place] 1.10 AP% 10.6 CDE 7.41 Fluid Pack Lot # [id number]   CATARACT EXTRACTION W/PHACO Right 03/08/2016   Procedure: CATARACT EXTRACTION PHACO AND INTRAOCULAR LENS PLACEMENT ([place]);  Surgeon: Eulogio Bear, MD;  Location: [facility] ORS;  Service: Ophthalmology;  Laterality: Right;  [place] 1.14 AP% 11.6 CDE 11.80 Fluid Pack Lot # [id number]   HERNIA REPAIR     HIP ARTHROPLASTY Left 07/06/2016   Procedure: ARTHROPLASTY BIPOLAR HIP (HEMIARTHROPLASTY);  Surgeon: Hessie Knows, MD;  Location: [facility] ORS;  Service: Orthopedics;  Laterality: Left;    Social History:  reports that he has never smoked. He has never used smokeless tobacco. He reports that he does not drink alcohol and does not use drugs.  No Known Allergies  Family History  Problem Relation Age of Onset   COPD Brother    Hypertension Neg Hx    Diabetes Neg Hx    Heart disease Neg Hx      Prior to Admission medications   Medication Sig Start Date End Date Taking? Authorizing Provider  acetaminophen (TYLENOL) 325 MG tablet Take 650 mg by mouth every 6 (six) hours as needed.     [provider]  albuterol (PROVENTIL HFA;VENTOLIN HFA) 108 (90 Base) MCG/ACT inhaler Inhale 2 puffs into the lungs 3 (three) times daily as needed for wheezing or shortness of breath.     [provider]  alum & mag hydroxide-simeth (MAALOX/MYLANTA) 200-200-20 MG/5ML suspension Take 30 mLs by mouth every 6 (six) hours as needed for indigestion or heartburn.     [provider]  bifidobacterium infantis (ALIGN) capsule Take 1 capsule by mouth daily.    [provider]  budesonide-formoterol (SYMBICORT) 160-4.5 MCG/ACT inhaler Inhale 2 puffs into the lungs 2 (two) times daily.    [provider]  carbidopa-levodopa  (SINEMET IR) 25-100 MG tablet Take 1 tablet by mouth 3 (three) times daily. Patient taking differently: Take 1 tablet by mouth 3 (three) times daily. 50carbi and 200 levo 07/18/17   Venia Carbon, MD  cetirizine (ZYRTEC) 10 MG tablet Take 10 mg by mouth daily as needed for allergies.    [provider]  citalopram (CELEXA) 10 MG tablet Take 10 mg by mouth every other day.    [provider]  fluticasone (FLONASE) 50 MCG/ACT nasal spray Place 2 sprays into the nose daily.    [provider]  magnesium hydroxide (MILK OF MAGNESIA) 400 MG/5ML suspension Take 30 mLs by mouth daily as needed for mild constipation.     [provider]  montelukast (SINGULAIR) 10 MG tablet Take 10 mg by mouth at bedtime.    [provider]  omeprazole (PRILOSEC) 20 MG capsule Take 1 capsule by mouth daily.    [provider]  spironolactone (ALDACTONE) 50 MG tablet Take 50 mg by mouth daily.    [provider]  tamsulosin (FLOMAX) 0.4 MG CAPS capsule Take 0.4 mg by mouth daily.     [provider]    Physical Exam: Vitals:   05/30/21 0927 05/30/21 0930 05/30/21 1030  BP: (!) 142/66 140/64   Pulse: 77 73 65  Resp: 20 14 16   Temp: 98 F (36.7 C)    TempSrc: Oral    SpO2: 96% 96% 96%  Weight:  77.1 kg   Height:  5\' 9"  (1.753 m)    Wt Readings from Last 3 Encounters:  05/30/21 77.1 kg  01/31/20 65.8 kg  01/25/20 69.5 kg   Body mass index is 25.1 kg/m.  General:  Average built, not in obvious distress HENT: Normocephalic, pupils equally reacting to light and accommodation.  No scleral pallor or icterus noted. Oral mucosa is moist.  Chest:  Clear breath sounds.  Diminished breath sounds bilaterally. No crackles or wheezes.  CVS: S1 &S2 heard. No murmur.  Regular rate and rhythm. Abdomen: Soft, nontender, nondistended.  Bowel sounds are heard.  Liver is not palpable, no abdominal mass palpated Extremities: Left lower extremity with  gross edema erythema mild discomfort on palpation.  Edema and erythema extending all the way to the foot and upwards towards the lower thigh.  Mild edema on the right lower extremity from baseline chronic venous insufficiency.  Local rise of temperature in the left lower extremity Psych: Alert, awake and oriented, normal mood CNS:  No cranial nerve deficits.  Power equal in all extremities.    Skin: Warm and dry.  Bilateral lower extremity redness and edema.       Labs on Admission:   CBC: Recent Labs  Lab 05/30/21 0932  WBC 7.3  NEUTROABS 5.3  HGB 13.5  HCT 38.9*  MCV 89.2  PLT [id number]    Basic Metabolic Panel: Recent Labs  Lab 05/30/21 0932  NA 132*  K 3.5  CL 101  CO2 25  GLUCOSE 140*  BUN 22  CREATININE 0.71  CALCIUM 8.6*  Liver Function Tests: Recent Labs  Lab 05/30/21 0932  AST 25  ALT 10  ALKPHOS 97  BILITOT 0.8  PROT 6.6  ALBUMIN 3.7   No results for input(s): LIPASE, AMYLASE in the last 168 hours. No results for input(s): AMMONIA in the last 168 hours.  Cardiac Enzymes: No results for input(s): CKTOTAL, CKMB, CKMBINDEX, TROPONINI in the last 168 hours.  BNP (last 3 results) No results for input(s): BNP in the last 8760 hours.  ProBNP (last 3 results) No results for input(s): PROBNP in the last 8760 hours.  CBG: No results for input(s): GLUCAP in the last 168 hours.  Lipase  No results found for: LIPASE   Urinalysis    Component Value Date/Time   COLORURINE YELLOW (A) 01/31/2020 1811   APPEARANCEUR CLEAR (A) 01/31/2020 1811   LABSPEC 1.015 01/31/2020 1811   PHURINE 6.0 01/31/2020 1811   GLUCOSEU NEGATIVE 01/31/2020 1811   HGBUR NEGATIVE 01/31/2020 1811   BILIRUBINUR NEGATIVE 01/31/2020 1811   KETONESUR 20 (A) 01/31/2020 1811   PROTEINUR NEGATIVE 01/31/2020 1811   NITRITE NEGATIVE 01/31/2020 1811   LEUKOCYTESUR NEGATIVE 01/31/2020 1811     Drugs of Abuse  No results found for: LABOPIA, COCAINSCRNUR, LABBENZ, AMPHETMU, THCU,  LABBARB    Radiological Exams on Admission: US Venous Img Lower Unilateral Left  Result Date: 05/30/2021 CLINICAL DATA:  Redness and swelling EXAM: LEFT LOWER EXTREMITY VENOUS DOPPLER ULTRASOUND TECHNIQUE: Gray-scale sonography with compression, as well as color and duplex ultrasound, were performed to evaluate the deep venous system(s) from the level of the common femoral vein through the popliteal and proximal calf veins. COMPARISON:  01/31/2020 FINDINGS: VENOUS Normal compressibility of the common femoral, superficial femoral, and popliteal veins, as well as the visualized calf veins. Visualized portions of profunda femoral vein and great saphenous vein unremarkable. No filling defects to suggest DVT on grayscale or color Doppler imaging. Doppler waveforms show normal direction of venous flow, normal respiratory phasicity and response to augmentation. Limited views of the contralateral common femoral vein are unremarkable. OTHER None. Limitations: none IMPRESSION: No femoropopliteal DVT nor evidence of DVT within the visualized calf veins. If clinical symptoms are inconsistent or if there are persistent or worsening symptoms, further imaging (possibly involving the iliac veins) may be warranted. Electronically Signed   By: Lucrezia Europe M.D.   On: 05/30/2021 10:28    EKG: Personally reviewed by me which shows sinus rhythm with right bundle branch block  Assessment/Plan Principal Problem:   Cellulitis of left lower leg Active Problems:   Parkinson's disease (HCC)   Venous insufficiency of both lower extremities   Hyponatremia   HTN (hypertension)   Depression   Cellulitis   Gross left lower extremity cellulitis.  Background of chronic venous insufficiency.  Patient has failed outpatient treatment with Keflex and Rocephin at nursing facility.  Lower extremity duplex ultrasound was negative for DVT.  Cellulitis order set has been utilized.  We will continue with IV vancomycin at this time.   Follow-up blood cultures.  He is afebrile without leukocytosis could be secondary to partially treated condition with antibiotic.  History of Parkinson's disease.  Continue Sinemet from home.  At baseline patient is ambulatory.  Will get PT evaluation while in hospital.  Mild hyponatremia.  We will continue to monitor closely.  Patient take Celexa at baseline.  Depression.  Continue Celexa.  Hypertension.  Will resume torsemide.  Was on spironolactone currently not on it.  We will continue to monitor.   DVT Prophylaxis:  Lovenox subcu  Consultant: None  Code Status: Full code  Microbiology blood cultures have been sent from the ED.  Antibiotics: Vancomycin IV  Family Communication:  Patients' condition and plan of care including tests being ordered have been discussed with the patient and the patient's daughter at bedside who indicate understanding and agree with the plan.   Status is: Inpatient  Remains inpatient appropriate because: Gross cellulitis requiring IV antibiotic.   Severity of Illness: The appropriate patient status for this patient is INPATIENT. Inpatient status is judged to be reasonable and necessary in order to provide the required intensity of service to ensure the patient's safety. The patient's presenting symptoms, physical exam findings, and initial radiographic and laboratory data in the context of their chronic comorbidities is felt to place them at high risk for further clinical deterioration. Furthermore, it is not anticipated that the patient will be medically stable for discharge from the hospital within 2 midnights of admission.  I certify that at the point of admission it is my clinical judgment that the patient will require inpatient hospital care spanning beyond 2 midnights from the point of admission due to high intensity of service, high risk for further deterioration and high frequency of surveillance required.  Signed, Joycelyn Das, MD [facility] 05/30/2021

## 2021-05-30 NOTE — ED Notes (Signed)
Admission MD messaged in regard to ordering pt's home Carbidopa-levodopa 50-200mg .

## 2021-05-30 NOTE — ED Provider Notes (Signed)
[facility] Emergency Department Provider Note  ____________________________________________  Time seen: Approximately 10:51 AM  I have reviewed the triage vital signs and the nursing notes.   HISTORY  Chief Complaint Leg Swelling (Cellulitis)    HPI Brandon Calhoun is a 87 y.o. male with a past history of GERD and hypertension who was brought to the ED due to worsening left leg swelling and redness.  He has been treated for left leg cellulitis for the past 4 days, initially on Keflex and then on Rocephin but is continuing to worsen.  Patient denies chest pain or shortness of breath, no fever.  He has moderate intensity pain in the left leg which is worse with movement, constant, nonradiating.    Past Medical History:  Diagnosis Date   Asthma    Edema    MILD OF FEET   GERD (gastroesophageal reflux disease)    Hypertension    Mood disorder (HCC)    chronic anxiety     Patient Active Problem List   Diagnosis Date Noted   HTN (hypertension) 01/31/2020   Depression 01/31/2020   Fall 01/31/2020   Multiple rib fractures 01/31/2020   Cellulitis of left lower leg 01/31/2020   Aspiration pneumonia (HCC) 01/31/2020   Asthma 01/31/2020   Hyponatremia 10/29/2019   Leukocytosis 10/29/2019   Accidental fall 10/29/2019   Multiple fractures of ribs, right side, initial encounter for closed fracture 10/29/2019   Generalized weakness 10/29/2019   Venous insufficiency of both lower extremities 07/03/2018   Parkinson's disease (HCC) 01/03/2017   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   GERD (gastroesophageal reflux disease)    Asthma exacerbation    Mood disorder [facility])      Past Surgical History:  Procedure Laterality Date   CATARACT EXTRACTION W/PHACO Left 02/16/2016   Procedure: CATARACT EXTRACTION PHACO AND INTRAOCULAR LENS PLACEMENT ([place]);  Surgeon: Nevada Crane, MD;  Location: [facility] ORS;  Service: Ophthalmology;   Laterality: Left;  [place] 1.10 AP% 10.6 CDE 7.41 Fluid Pack Lot # [id number]   CATARACT EXTRACTION W/PHACO Right 03/08/2016   Procedure: CATARACT EXTRACTION PHACO AND INTRAOCULAR LENS PLACEMENT ([place]);  Surgeon: Nevada Crane, MD;  Location: [facility] ORS;  Service: Ophthalmology;  Laterality: Right;  [place] 1.14 AP% 11.6 CDE 11.80 Fluid Pack Lot # [id number]   HERNIA REPAIR     HIP ARTHROPLASTY Left 07/06/2016   Procedure: ARTHROPLASTY BIPOLAR HIP (HEMIARTHROPLASTY);  Surgeon: Kennedy Bucker, MD;  Location: [facility] ORS;  Service: Orthopedics;  Laterality: Left;     Prior to Admission medications   Medication Sig Start Date End Date Taking? Authorizing Provider  acetaminophen (TYLENOL) 325 MG tablet Take 650 mg by mouth every 6 (six) hours as needed.     [provider]  albuterol (PROVENTIL HFA;VENTOLIN HFA) 108 (90 Base) MCG/ACT inhaler Inhale 2 puffs into the lungs 3 (three) times daily as needed for wheezing or shortness of breath.     [provider]  alum & mag hydroxide-simeth (MAALOX/MYLANTA) 200-200-20 MG/5ML suspension Take 30 mLs by mouth every 6 (six) hours as needed for indigestion or heartburn.     [provider]  bifidobacterium infantis (ALIGN) capsule Take 1 capsule by mouth daily.    [provider]  budesonide-formoterol (SYMBICORT) 160-4.5 MCG/ACT inhaler Inhale 2 puffs into the lungs 2 (two) times daily.    [provider]  carbidopa-levodopa (SINEMET IR) 25-100 MG tablet Take 1 tablet by mouth 3 (three) times daily. 07/18/17   [name],  MD  cetirizine (ZYRTEC) 10 MG tablet Take 10 mg by mouth daily as needed for allergies.    [provider]  citalopram (CELEXA) 10 MG tablet Take 10 mg by mouth every other day.    [provider]  fluticasone (FLONASE) 50 MCG/ACT nasal spray Place 2 sprays into the nose daily.    [provider]  magnesium hydroxide (MILK OF MAGNESIA) 400 MG/5ML suspension Take 30 mLs by mouth  daily as needed for mild constipation.     [provider]  montelukast (SINGULAIR) 10 MG tablet Take 10 mg by mouth at bedtime.    [provider]  omeprazole (PRILOSEC) 20 MG capsule Take 1 capsule by mouth daily.    [provider]  spironolactone (ALDACTONE) 50 MG tablet Take 50 mg by mouth daily.    [provider]  tamsulosin (FLOMAX) 0.4 MG CAPS capsule Take 0.4 mg by mouth daily.     [provider]     Allergies Patient has no known allergies.   Family History  Problem Relation Age of Onset   COPD Brother    Hypertension Neg Hx    Diabetes Neg Hx    Heart disease Neg Hx     Social History Social History   Tobacco Use   Smoking status: Never   Smokeless tobacco: Never  Substance Use Topics   Alcohol use: No   Drug use: No    Review of Systems  Constitutional:   No fever or chills.  ENT:   No sore throat. No rhinorrhea. Cardiovascular:   No chest pain or syncope. Respiratory:   No dyspnea or cough. Gastrointestinal:   Negative for abdominal pain, vomiting and diarrhea.  Musculoskeletal:   Positive left leg pain and swelling All other systems reviewed and are negative except as documented above in ROS and HPI.  ____________________________________________   PHYSICAL EXAM:  VITAL SIGNS: ED Triage Vitals  Enc Vitals Group     BP 05/30/21 0927 (!) 142/66     Pulse Rate 05/30/21 0927 77     Resp 05/30/21 0927 20     Temp 05/30/21 0927 98 F (36.7 C)     Temp Source 05/30/21 0927 Oral     SpO2 05/30/21 0927 96 %     Weight 05/30/21 0930 169 lb 15.6 oz (77.1 kg)     Height 05/30/21 0930 5\' 9"  (1.753 m)     Head Circumference --      Peak Flow --      Pain Score 05/30/21 0929 10     Pain Loc --      Pain Edu? --      Excl. in GC? --     Vital signs reviewed, nursing assessments reviewed.   Constitutional:   Alert and oriented. Non-toxic appearance. Eyes:   Conjunctivae are normal. EOMI. PERRL. ENT       Head:   Normocephalic and atraumatic.      Nose:   Wearing a mask.      Mouth/Throat:   Wearing a mask.      Neck:   No meningismus. Full ROM. Hematological/Lymphatic/Immunilogical:   No cervical lymphadenopathy. Cardiovascular:   RRR. Symmetric bilateral radial and DP pulses.  No murmurs. Cap refill less than 2 seconds. Respiratory:   Normal respiratory effort without tachypnea/retractions. Breath sounds are clear and equal bilaterally. No wheezes/rales/rhonchi. Gastrointestinal:   Soft and nontender. Non distended. There is no CVA tenderness.  No rebound, rigidity, or guarding. Genitourinary:  deferred Musculoskeletal:   There is trace edema of the right lower extremity, and 3+ edema of the left lower extremity with erythema warmth and tenderness.  No wounds, no purulent drainage.  No crepitus. Neurologic:   Normal speech and language.  Motor grossly intact. No acute focal neurologic deficits are appreciated.  Skin:    Skin is warm, dry and intact.  Inflammatory changes as noted above.  No petechiae, purpura, or bullae.  ____________________________________________    LABS (pertinent positives/negatives) (all labs ordered are listed, but only abnormal results are displayed) Labs Reviewed  COMPREHENSIVE METABOLIC PANEL - Abnormal; Notable for the following components:      Result Value   Sodium 132 (*)    Glucose, Bld 140 (*)    Calcium 8.6 (*)    All other components within normal limits  CBC WITH DIFFERENTIAL/PLATELET - Abnormal; Notable for the following components:   HCT 38.9 (*)    All other components within normal limits  CULTURE, BLOOD (ROUTINE X 2)  CULTURE, BLOOD (ROUTINE X 2)  RESP PANEL BY RT-PCR (FLU A&B, COVID) [id number]   ____________________________________________   EKG    ____________________________________________    RADIOLOGY  US Venous Img Lower Unilateral Left  Result Date: 05/30/2021 CLINICAL DATA:  Redness and swelling EXAM: LEFT LOWER  EXTREMITY VENOUS DOPPLER ULTRASOUND TECHNIQUE: Gray-scale sonography with compression, as well as color and duplex ultrasound, were performed to evaluate the deep venous system(s) from the level of the common femoral vein through the popliteal and proximal calf veins. COMPARISON:  01/31/2020 FINDINGS: VENOUS Normal compressibility of the common femoral, superficial femoral, and popliteal veins, as well as the visualized calf veins. Visualized portions of profunda femoral vein and great saphenous vein unremarkable. No filling defects to suggest DVT on grayscale or color Doppler imaging. Doppler waveforms show normal direction of venous flow, normal respiratory phasicity and response to augmentation. Limited views of the contralateral common femoral vein are unremarkable. OTHER None. Limitations: none IMPRESSION: No femoropopliteal DVT nor evidence of DVT within the visualized calf veins. If clinical symptoms are inconsistent or if there are persistent or worsening symptoms, further imaging (possibly involving the iliac veins) may be warranted. Electronically Signed   By: Corlis Leak M.D.   On: 05/30/2021 10:28    ____________________________________________   PROCEDURES Procedures  ____________________________________________    CLINICAL IMPRESSION / ASSESSMENT AND PLAN / ED COURSE  Medications ordered in the ED: Medications  metroNIDAZOLE (FLAGYL) IVPB 500 mg (500 mg Intravenous New Bag/Given 05/30/21 1025)  vancomycin (VANCOCIN) IVPB 1000 mg/200 mL premix (has no administration in time range)  vancomycin (VANCOREADY) IVPB 750 mg/150 mL (has no administration in time range)  sodium chloride 0.9 % bolus 1,000 mL (1,000 mLs Intravenous New Bag/Given 05/30/21 1012)  ceFEPIme (MAXIPIME) 2 g in sodium chloride 0.9 % 100 mL IVPB (2 g Intravenous New Bag/Given 05/30/21 1012)    Pertinent labs & imaging results that were available during my care of the patient were reviewed by me and considered in my  medical decision making (see chart for details).  JAXIEL KINES was evaluated in Emergency Department on 05/30/2021 for the symptoms described in the history of present illness. He was evaluated in the context of the global COVID-19 pandemic, which necessitated consideration that the patient might be at risk for infection with the SARS-CoV-2 virus that causes COVID-19. Institutional protocols and algorithms that pertain to the evaluation of patients at risk for COVID-19 are in a state of rapid change based on information  released by regulatory bodies including the CDC and federal and state organizations. These policies and algorithms were followed during the patient's care in the ED.   Patient presents with cellulitis of the left leg with outpatient treatment failure.  Ultrasound performed today is negative for DVT.  Vitals are normal and the patient is not septic, however with recent use of 2 different antibiotics, will send blood cultures.  Serum labs unremarkable.  Case discussed with hospitalist for further management.      ____________________________________________   FINAL CLINICAL IMPRESSION(S) / ED DIAGNOSES    Final diagnoses:  Left leg cellulitis     ED Discharge Orders     None       Portions of this note were generated with dragon dictation software. Dictation errors may occur despite best attempts at proofreading.    Sharman Cheek, MD 05/30/21 [phone]

## 2021-05-31 LAB — BASIC METABOLIC PANEL
Anion gap: 6 (ref 5–15)
BUN: 23 mg/dL (ref 8–23)
CO2: 26 mmol/L (ref 22–32)
Calcium: 8.3 mg/dL — ABNORMAL LOW (ref 8.9–10.3)
Chloride: 103 mmol/L (ref 98–111)
Creatinine, Ser: 0.74 mg/dL (ref 0.61–1.24)
GFR, Estimated: >60 mL/min (ref >60)
Glucose, Bld: 103 mg/dL — ABNORMAL HIGH (ref 70–99)
Potassium: 3.7 mmol/L (ref 3.5–5.1)
Sodium: 135 mmol/L (ref 135–145)

## 2021-05-31 LAB — CBC
HCT: 37 % — ABNORMAL LOW (ref 39.0–52.0)
Hemoglobin: 12.6 g/dL — ABNORMAL LOW (ref 13.0–17.0)
MCH: 30 pg (ref 26.0–34.0)
MCHC: 34.1 g/dL (ref 30.0–36.0)
MCV: 88.1 fL (ref 80.0–100.0)
Platelets: 248 10*3/uL (ref 150–400)
RBC: 4.2 MIL/uL — ABNORMAL LOW (ref 4.22–5.81)
RDW: 14.3 % (ref 11.5–15.5)
WBC: 8.2 10*3/uL (ref 4.0–10.5)
nRBC: 0 % (ref 0.0–0.2)

## 2021-05-31 LAB — MAGNESIUM: Magnesium: 1.9 mg/dL (ref 1.7–2.4)

## 2021-05-31 NOTE — Hospital Course (Addendum)
[name] is Brandon Calhoun 87 y.o. male with past medical history of chronic venous insufficiency, GERD, hypertension, chronic anxiety from skilled nursing facility presented to hospital with worsening redness swelling of his left lower extremity.  He failed keflex and IM ceftriaxone outpatient.  He's been admitted for IV abx for cellulitis.  He improved rapidly with IV vancomycin.  Given improvement on MRSA coverage, will discharge with doxycycline.  Recommending dermatology follow up outpatient.  See below for additional details

## 2021-05-31 NOTE — Plan of Care (Signed)

## 2021-05-31 NOTE — Assessment & Plan Note (Addendum)
Would recommend compression stockings after infection treated

## 2021-05-31 NOTE — Assessment & Plan Note (Addendum)
Torsemide, appears he's no longer taking spironolactone based on med rec - will d/c spiro from med list

## 2021-05-31 NOTE — TOC Progression Note (Signed)
Transition of Care [facility]) - Progression Note    Patient Details  Name: AMIRR ACHORD MRN: [id number] Date of Birth: 15-Aug-1929  Transition of [facility]) CM/SW Contact  Marlowe Sax, RN Phone Number: 05/31/2021, 2:42 PM  Clinical Narrative:        Spoke with [employer], sent to medical director for review    Expected Discharge Plan and Services                                                 Social Determinants of Health (SDOH) Interventions    Readmission Risk Interventions No flowsheet data found.

## 2021-05-31 NOTE — TOC Progression Note (Addendum)
Transition of Care [facility]) - Progression Note    Patient Details  Name: Brandon Calhoun MRN: [id number] Date of Birth: 01-28-1930  Transition of Care [facility]) CM/SW Contact  Marlowe Sax, RN Phone Number: 05/31/2021, 2:00 PM  Clinical Narrative:    The patient is a long term resident at [facility], he will return to [facility], Submitted to get Ins approval to go to STR at [facility] anticipate DC tomorrow, ref number [id number]        Expected Discharge Plan and Services                                                 Social Determinants of Health (SDOH) Interventions    Readmission Risk Interventions No flowsheet data found.

## 2021-05-31 NOTE — Progress Notes (Signed)
PROGRESS NOTE    Brandon Calhoun  ZOX:[id number] DOB: Jun 03, 1930 DOA: 05/30/2021 PCP: [name], MD  Chief Complaint  Patient presents with   Leg Swelling    Cellulitis    Brief Narrative:  Brandon Calhoun is Brandon Calhoun 87 y.o. male with past medical history of chronic venous insufficiency, GERD, hypertension, chronic anxiety from skilled nursing facility presented to hospital with worsening redness swelling of his left lower extremity.  He's been admitted for IV abx for cellulitis.    Assessment & Plan:   Principal Problem:   Cellulitis of left lower leg Active Problems:   Cellulitis   Venous insufficiency of both lower extremities   Parkinson's disease (HCC)   Hyponatremia   Depression   HTN (hypertension)   * Cellulitis of left lower leg Based on review of images and discussion with family, improving on vancomycin (failed keflex and ceftriaxone outpatient) Will plan to d/c with MRSA coverage (did have negative MRSA PCR here notably) LE [place] without DVT noted I think dermatology follow up maybe reasonable  Venous insufficiency of both lower extremities Would recommend compression after infection treated  Parkinson's disease (HCC) Sinemet, therapy  Hyponatremia Mild, improved  Depression celexa  HTN (hypertension) Torsemide, appears he's no longer taking spironolactone     DVT prophylaxis: lovenox Code Status: full Family Communication: daughter, son in law at bedside Disposition:   Status is: Inpatient  Remains inpatient appropriate because: need for IV abx       Consultants:  none  Procedures:  LE US IMPRESSION: No femoropopliteal DVT nor evidence of DVT within the visualized calf veins.   If clinical symptoms are inconsistent or if there are persistent or worsening symptoms, further imaging (possibly involving the iliac veins) may be warranted.    Antimicrobials:  Anti-infectives (From admission, onward)    Start     Dose/Rate  Route Frequency Ordered Stop   05/31/21 1000  vancomycin (VANCOREADY) IVPB 1500 mg/300 mL        1,500 mg 150 mL/hr over 120 Minutes Intravenous Every 24 hours 05/30/21 1138     05/30/21 1130  vancomycin (VANCOCIN) IVPB 1000 mg/200 mL premix  Status:  Discontinued        1,000 mg 200 mL/hr over 60 Minutes Intravenous  Once 05/30/21 1119 05/30/21 1134   05/30/21 0945  ceFEPIme (MAXIPIME) 2 g in sodium chloride 0.9 % 100 mL IVPB        2 g 200 mL/hr over 30 Minutes Intravenous  Once 05/30/21 0930 05/30/21 1042   05/30/21 0945  metroNIDAZOLE (FLAGYL) IVPB 500 mg        500 mg 100 mL/hr over 60 Minutes Intravenous  Once 05/30/21 0930 05/30/21 1224   05/30/21 0945  vancomycin (VANCOCIN) IVPB 1000 mg/200 mL premix        1,000 mg 200 mL/hr over 60 Minutes Intravenous  Once 05/30/21 0930 05/30/21 1419   05/30/21 0945  vancomycin (VANCOREADY) IVPB 750 mg/150 mL        750 mg 150 mL/hr over 60 Minutes Intravenous  Once 05/30/21 0938 05/30/21 1540       Subjective: No new complaints  Objective: Vitals:   05/31/21 0526 05/31/21 0754 05/31/21 1119 05/31/21 1509  BP: 139/63 (!) 148/66 (!) 111/51 (!) 94/54  Pulse: 66 65 65 66  Resp: Temp: 98.1 F (36.7 C) (!) 97.4 F (36.3 C) 98 F (36.7 C) 97.9 F (36.6 C)  TempSrc:   Oral   SpO2:  96% 91% 95% 95%  Weight:      Height:        Intake/Output Summary (Last 24 hours) at 05/31/2021 1952 Last data filed at 05/31/2021 1341 Gross per 24 hour  Intake --  Output 1200 ml  Net -1200 ml   Filed Weights   05/30/21 0930  Weight: 77.1 kg    Examination:  General exam: Appears calm and comfortable  Respiratory system: Clear to auscultation. Respiratory effort normal. Cardiovascular system: S1 & S2 heard, RRR Gastrointestinal system: Abdomen is nondistended, soft and nontender.  Central nervous system: Alert and oriented. No focal neurological deficits. Extremities: LLE edema, erythema, improved from previous image -  palpable distal pulses   Data Reviewed: I have personally reviewed following labs and imaging studies  CBC: Recent Labs  Lab 05/30/21 0932 05/31/21 0217  WBC 7.3 8.2  NEUTROABS 5.3  --   HGB 13.5 12.6*  HCT 38.9* 37.0*  MCV 89.2 88.1  PLT 251 248    Basic Metabolic Panel: Recent Labs  Lab 05/30/21 0932 05/31/21 0217  NA 132* 135  K 3.5 3.7  CL 101 103  CO2 25 26  GLUCOSE 140* 103*  BUN 22 23  CREATININE 0.71 0.74  CALCIUM 8.6* 8.3*  MG  --  1.9    GFR: Estimated Creatinine Clearance: 62.6 mL/min (by C-G formula based on SCr of 0.74 mg/dL).  Liver Function Tests: Recent Labs  Lab 05/30/21 0932  AST 25  ALT 10  ALKPHOS 97  BILITOT 0.8  PROT 6.6  ALBUMIN 3.7    CBG: No results for input(s): GLUCAP in the last 168 hours.   Recent Results (from the past 240 hour(s))  Culture, blood (routine x 2)     Status: None (Preliminary result)   Collection Time: 05/30/21  9:32 AM   Specimen: BLOOD LEFT ARM  Result Value Ref Range Status   Specimen Description BLOOD LEFT ARM  Final   Special Requests   Final    BOTTLES DRAWN AEROBIC AND ANAEROBIC Blood Culture adequate volume   Culture   Final    NO GROWTH < 24 HOURS Performed at [facility], [address] [place], [place] [address]    Report Status PENDING  Incomplete  Culture, blood (routine x 2)     Status: None (Preliminary result)   Collection Time: 05/30/21  9:32 AM   Specimen: BLOOD  Result Value Ref Range Status   Specimen Description BLOOD  Final   Special Requests   Final    BOTTLES DRAWN AEROBIC AND ANAEROBIC Blood Culture adequate volume   Culture   Final    NO GROWTH < 24 HOURS Performed at [facility], [address], [place], [place] [address]    Report Status PENDING  Incomplete  Resp Panel by RT-PCR (Flu Tahiri Shareef&B, Covid) Nasopharyngeal Swab     Status: None   Collection Time: 05/30/21  9:32 AM   Specimen: Nasopharyngeal Swab; Nasopharyngeal(NP) swabs in vial transport  medium  Result Value Ref Range Status   SARS Coronavirus 2 by RT PCR NEGATIVE NEGATIVE Final    Comment: (NOTE) SARS-CoV-2 target nucleic acids are NOT DETECTED.  The SARS-CoV-2 RNA is generally detectable in upper respiratory specimens during the acute phase of infection. The lowest concentration of SARS-CoV-2 viral copies this assay can detect is 138 copies/mL. Bao Coreas negative result does not preclude SARS-Cov-2 infection and should not be used as the sole basis for treatment or other patient management decisions. Tene Gato negative result  may occur with  improper specimen collection/handling, submission of specimen other than nasopharyngeal swab, presence of viral mutation(s) within the areas targeted by this assay, and inadequate number of viral copies(<138 copies/mL). Natacha Jepsen negative result must be combined with clinical observations, patient history, and epidemiological information. The expected result is Negative.  Fact Sheet for Patients:  BloggerCourse.com  Fact Sheet for Healthcare Providers:  SeriousBroker.it  This test is no t yet approved or cleared by the [place] FDA and  has been authorized for detection and/or diagnosis of SARS-CoV-2 by FDA under an Emergency Use Authorization (EUA). This EUA will remain  in effect (meaning this test can be used) for the duration of the COVID-19 declaration under Section 564(b)(1) of the Act, 21 U.S.C.section 360bbb-3(b)(1), unless the authorization is terminated  or revoked sooner.       Influenza Karmina Zufall by PCR NEGATIVE NEGATIVE Final   Influenza B by PCR NEGATIVE NEGATIVE Final    Comment: (NOTE) The Xpert Xpress SARS-CoV-2/FLU/RSV plus assay is intended as an aid in the diagnosis of influenza from Nasopharyngeal swab specimens and should not be used as Josejuan Hoaglin sole basis for treatment. Nasal washings and aspirates are unacceptable for Xpert Xpress SARS-CoV-2/FLU/RSV testing.  Fact Sheet for  Patients: BloggerCourse.com  Fact Sheet for Healthcare Providers: SeriousBroker.it  This test is not yet approved or cleared by the [place] FDA and has been authorized for detection and/or diagnosis of SARS-CoV-2 by FDA under an Emergency Use Authorization (EUA). This EUA will remain in effect (meaning this test can be used) for the duration of the COVID-19 declaration under Section 564(b)(1) of the Act, 21 U.S.C. section 360bbb-3(b)(1), unless the authorization is terminated or revoked.  Performed at [facility], [address] [place], [place] [address]   MRSA Next Gen by PCR, Nasal     Status: None   Collection Time: 05/30/21  1:33 PM   Specimen: Nasal Mucosa; Nasal Swab  Result Value Ref Range Status   MRSA by PCR Next Gen NOT DETECTED NOT DETECTED Final    Comment: (NOTE) The GeneXpert MRSA Assay (FDA approved for NASAL specimens only), is one component of Kaizen Ibsen comprehensive MRSA colonization surveillance program. It is not intended to diagnose MRSA infection nor to guide or monitor treatment for MRSA infections. Test performance is not FDA approved in patients less than 56 years old. Performed at [facility], [address], [place], [place] [address]          Radiology Studies: US Venous Img Lower Unilateral Left  Result Date: 05/30/2021 CLINICAL DATA:  Redness and swelling EXAM: LEFT LOWER EXTREMITY VENOUS DOPPLER ULTRASOUND TECHNIQUE: Gray-scale sonography with compression, as well as color and duplex ultrasound, were performed to evaluate the deep venous system(s) from the level of the common femoral vein through the popliteal and proximal calf veins. COMPARISON:  01/31/2020 FINDINGS: VENOUS Normal compressibility of the common femoral, superficial femoral, and popliteal veins, as well as the visualized calf veins. Visualized portions of profunda femoral vein and great saphenous vein  unremarkable. No filling defects to suggest DVT on grayscale or color Doppler imaging. Doppler waveforms show normal direction of venous flow, normal respiratory phasicity and response to augmentation. Limited views of the contralateral common femoral vein are unremarkable. OTHER None. Limitations: none IMPRESSION: No femoropopliteal DVT nor evidence of DVT within the visualized calf veins. If clinical symptoms are inconsistent or if there are persistent or worsening symptoms, further imaging (possibly involving the iliac veins) may be warranted. Electronically Signed  By: Corlis Leak M.D.   On: 05/30/2021 10:28        Scheduled Meds:  carbidopa-levodopa  1 tablet Oral TID   citalopram  10 mg Oral Once per day on Sun Tue Wed Fri Sat   enoxaparin (LOVENOX) injection  40 mg Subcutaneous Q24H   fluticasone  2 spray Each Nare Daily   guaiFENesin  600 mg Oral BID   loratadine  10 mg Oral Daily   mometasone-formoterol  2 puff Inhalation BID   montelukast  10 mg Oral QHS   pantoprazole  40 mg Oral Daily   sodium chloride flush  3 mL Intravenous Q12H   tamsulosin  0.4 mg Oral Daily   torsemide  20 mg Oral Daily   Continuous Infusions:  sodium chloride 250 mL (05/31/21 1041)   vancomycin 1,500 mg (05/31/21 1042)     LOS: 1 day    Time spent: over 30 min    Lacretia Nicks, MD [facility]   To contact the attending provider between 7A-7P or the covering provider during after hours 7P-7A, please log into the web site [email or web address] and access using universal [place] password for that web site. If you do not have the password, please call the hospital operator.  05/31/2021, 7:52 PM

## 2021-05-31 NOTE — Assessment & Plan Note (Addendum)
Sinemet, PT/OT Neurology follow up outpatient

## 2021-05-31 NOTE — Assessment & Plan Note (Addendum)
Based on review of images and discussion with family, rapidly improved on IV vancomycin (failed keflex and ceftriaxone outpatient) Will plan to d/c with MRSA coverage (did have negative MRSA PCR here notably, but improved on vanc) LE [place] without DVT noted I think dermatology follow up is reasonable

## 2021-05-31 NOTE — Assessment & Plan Note (Signed)
Mild, improved. 

## 2021-05-31 NOTE — [place] FL2 (Signed)
[place] MEDICAID FL2 LEVEL OF CARE SCREENING TOOL     IDENTIFICATION  Patient Name: Brandon Calhoun Birthdate: 1929/07/12 Sex: male Admission Date (Current Location): 05/30/2021  [facility] and [place] Number:  Chiropodist and Address:  [facility], [address], [place], [place] [address]      Provider Number: [id number]  Attending Physician Name and Address:  Zigmund Daniel., *  Relative Name and Phone Number:  Darl Pikes daugher [phone]    Current Level of Care: Hospital Recommended Level of Care: Skilled Nursing Facility Prior Approval Number:    Date Approved/Denied:   PASRR Number: [id number]  Discharge Plan: SNF    Current Diagnoses: Patient Active Problem List   Diagnosis Date Noted   Cellulitis 05/30/2021   HTN (hypertension) 01/31/2020   Depression 01/31/2020   Fall 01/31/2020   Multiple rib fractures 01/31/2020   Cellulitis of left lower leg 01/31/2020   Aspiration pneumonia (HCC) 01/31/2020   Asthma 01/31/2020   Hyponatremia 10/29/2019   Leukocytosis 10/29/2019   Accidental fall 10/29/2019   Multiple fractures of ribs, right side, initial encounter for closed fracture 10/29/2019   Generalized weakness 10/29/2019   Venous insufficiency of both lower extremities 07/03/2018   Parkinson's disease (HCC) 01/03/2017   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   GERD (gastroesophageal reflux disease)    Asthma exacerbation    Mood disorder (HCC)     Orientation RESPIRATION BLADDER Height & Weight     Self, Time, Situation, Place  Normal Continent Weight: 77.1 kg Height:  5\' 9"  (175.3 cm)  BEHAVIORAL SYMPTOMS/MOOD NEUROLOGICAL BOWEL NUTRITION STATUS      Continent Diet (regular)  AMBULATORY STATUS COMMUNICATION OF NEEDS Skin   Limited Assist Verbally Normal (callulitis lower legs)                       Personal Care Assistance Level of Assistance  Bathing, Feeding, Dressing  Bathing Assistance: Limited assistance Feeding assistance: Independent Dressing Assistance: Limited assistance     Functional Limitations Info             SPECIAL CARE FACTORS FREQUENCY  PT (By licensed PT)     PT Frequency: 5 times per week              Contractures Contractures Info: Not present    Additional Factors Info                  Current Medications (05/31/2021):  This is the current hospital active medication list Current Facility-Administered Medications  Medication Dose Route Frequency Provider Last Rate Last Admin   0.9 %  sodium chloride infusion  250 mL Intravenous PRN Pokhrel, Laxman, MD 10 mL/hr at 05/31/21 1041 250 mL at 05/31/21 1041   acetaminophen (TYLENOL) tablet 650 mg  650 mg Oral Q6H PRN Pokhrel, Laxman, MD       Or   acetaminophen (TYLENOL) suppository 650 mg  650 mg Rectal Q6H PRN Pokhrel, Laxman, MD       albuterol (PROVENTIL) (2.5 MG/3ML) 0.083% nebulizer solution 2.5 mg  2.5 mg Nebulization TID PRN Pokhrel, Laxman, MD       alum & mag hydroxide-simeth (MAALOX/MYLANTA) 200-200-20 MG/5ML suspension 30 mL  30 mL Oral Q6H PRN Pokhrel, Laxman, MD       carbidopa-levodopa (SINEMET CR) 50-200 MG per tablet controlled release 1 tablet  1 tablet Oral TID Pokhrel, Laxman, MD   1 tablet at 05/31/21 1139  citalopram (CELEXA) tablet 10 mg  10 mg Oral Once per day on Sun Tue Wed Fri Sat Joycelyn Das, MD   10 mg at 05/31/21 1046   enoxaparin (LOVENOX) injection 40 mg  40 mg Subcutaneous Q24H Pokhrel, Laxman, MD   40 mg at 05/30/21 2109   fluticasone (FLONASE) 50 MCG/ACT nasal spray 2 spray  2 spray Each Nare Daily Pokhrel, Laxman, MD   2 spray at 05/31/21 1047   guaiFENesin (MUCINEX) 12 hr tablet 600 mg  600 mg Oral BID Pokhrel, Laxman, MD   600 mg at 05/31/21 1043   loratadine (CLARITIN) tablet 10 mg  10 mg Oral Daily Pokhrel, Laxman, MD   10 mg at 05/31/21 1043   magnesium hydroxide (MILK OF MAGNESIA) suspension 30 mL  30 mL Oral Daily PRN  Pokhrel, Laxman, MD       mometasone-formoterol (DULERA) 200-5 MCG/ACT inhaler 2 puff  2 puff Inhalation BID Pokhrel, Laxman, MD   2 puff at 05/31/21 0807   montelukast (SINGULAIR) tablet 10 mg  10 mg Oral QHS Pokhrel, Laxman, MD   10 mg at 05/30/21 2110   ondansetron (ZOFRAN) tablet 4 mg  4 mg Oral Q6H PRN Pokhrel, Laxman, MD       Or   ondansetron (ZOFRAN) injection 4 mg  4 mg Intravenous Q6H PRN Pokhrel, Laxman, MD       pantoprazole (PROTONIX) EC tablet 40 mg  40 mg Oral Daily Pokhrel, Laxman, MD   40 mg at 05/31/21 1043   senna-docusate (Senokot-S) tablet 1 tablet  1 tablet Oral QHS PRN Pokhrel, Laxman, MD       sodium chloride flush (NS) 0.9 % injection 3 mL  3 mL Intravenous Q12H Pokhrel, Laxman, MD   3 mL at 05/31/21 1044   sodium chloride flush (NS) 0.9 % injection 3 mL  3 mL Intravenous PRN Pokhrel, Laxman, MD       tamsulosin (FLOMAX) capsule 0.4 mg  0.4 mg Oral Daily Pokhrel, Laxman, MD   0.4 mg at 05/31/21 1043   torsemide (DEMADEX) tablet 20 mg  20 mg Oral Daily Pokhrel, Laxman, MD   20 mg at 05/31/21 1043   vancomycin (VANCOREADY) IVPB 1500 mg/300 mL  1,500 mg Intravenous Q24H Tressie Ellis, RPH 150 mL/hr at 05/31/21 1042 1,500 mg at 05/31/21 1042     Discharge Medications: Please see discharge summary for a list of discharge medications.  Relevant Imaging Results:  Relevant Lab Results:   Additional Information SS#: [id number]  Marlowe Sax, RN

## 2021-05-31 NOTE — Consult Note (Signed)
WOC Nurse wound consult note Consultation was completed by review of records, images and assistance from the bedside nurse/clinical staff.  Reason for Consult:LE cellulitis Wound type: no open wounds Dressing procedure/placement/frequency: no topical care recommended. No open wounds.  Would not feel that compression is appropriate at this time due to acute cellulitis extending into the groin on the LLE.   Would prefer monitoring of the LEs if the cellulitis begins to resolve, consider ABIs to determine safety of compression if desired and re-consult WOC at that time.   Brandon Calhoun [facility], CNS, [employer] [phone]

## 2021-05-31 NOTE — Evaluation (Addendum)
Physical Therapy Evaluation Patient Details Name: Brandon Calhoun MRN: [id number] DOB: 08/05/1929 Today's Date: 05/31/2021  History of Present Illness  Brandon Calhoun is an 89yoM who comes to [facility] on 12/13 from facility c worsening leg erythema. Pt admitted with cellulitis. DVT study negative. PMH: HTN, asthma, GERD, depression, GAD, BPH, PD, fractures left ribs 4-9. At baseline, pt has not had a fall since August 2021, pt sleeps in lift-chair at night to improve independence with nocturnal ADL activity, uses a 4WW for all mobility, walks for fitness twice daily ~3/4 mile each time.  Clinical Impression  Pt admitted with above diagnosis. Pt currently with functional limitations due to the deficits listed below (see "PT Problem List"). Patient agreeable to PT evaluation. DTR, SIL in room at entry, already assisted pt to recliner for breakfast. Patient/family provides detailed description of PLOF and home environment. Pt required modA to stand, minGuard to AMB 247ft. At baseline he walks ~4024ft twice daily for exercise. Pt reports to feel at 20% of his baseline while AMB the unit. No LOB once upright standing is achieved. No pain in legs. LEE much improved since previous day. Patient's assessment this date reveals the patient requires an additional person present for safety and/or physical assistance to complete their typical ADL. At baseline, the patient is able to perform ADL with modified independence. Patient will benefit from skilled PT intervention to maximize independence and safety in mobility required for basic ADL performance at discharge.          Recommendations for follow up therapy are one component of a multi-disciplinary discharge planning process, led by the attending physician.  Recommendations may be updated based on patient status, additional functional criteria and insurance authorization.  Follow Up Recommendations Skilled nursing-short term rehab (<3 hours/day)    Assistance  Recommended at Discharge Intermittent Supervision/Assistance  Functional Status Assessment Patient has had a recent decline in their functional status and demonstrates the ability to make significant improvements in function in a reasonable and predictable amount of time.  Equipment Recommendations  None recommended by PT    Recommendations for Other Services       Precautions / Restrictions Precautions Precautions: Fall Restrictions Weight Bearing Restrictions: No      Mobility  Bed Mobility Overal bed mobility: Needs Assistance Bed Mobility: Supine to Sit     Supine to sit: Max assist     General bed mobility comments: family assisted pt up to recliner this morning, lots of assist as patient does not sleep in bed at home    Transfers Overall transfer level: Modified independent Equipment used: Rolling walker (2 wheels)               General transfer comment: modA from recliner, minA when pillow is added    Ambulation/Gait Ambulation/Gait assistance: Min guard Gait Distance (Feet): 200 Feet (2x266ft with 4 minutes seated recover interval) Assistive device: Rolling walker (2 wheels) Gait Pattern/deviations: WFL(Within Functional Limits) Gait velocity: 0.2m/s     General Gait Details: rigid in trunk per baseline  Stairs            Wheelchair Mobility    Modified Rankin (Stroke Patients Only)       Balance                                             Pertinent Vitals/Pain Pain Assessment: No/denies pain  Home Living Family/patient expects to be discharged to:: Skilled nursing facility                   Additional Comments: [facility] x1 year    Prior Function               Mobility Comments: walks daily twice <1 miles ADLs Comments: does boxing at [facility] [employer]        Extremity/Trunk Assessment   Upper Extremity Assessment Upper Extremity Assessment: Generalized weakness     Lower Extremity Assessment Lower Extremity Assessment: Generalized weakness    Cervical / Trunk Assessment Cervical / Trunk Assessment: Kyphotic  Communication   Communication: HOH  Cognition Arousal/Alertness: Awake/alert Behavior During Therapy: WFL for tasks assessed/performed Overall Cognitive Status: Within Functional Limits for tasks assessed                                          General Comments      Exercises     Assessment/Plan    PT Assessment Patient needs continued PT services  PT Problem List Decreased strength;Decreased range of motion;Decreased activity tolerance;Decreased balance;Decreased mobility;Decreased cognition;Decreased knowledge of use of DME;Decreased safety awareness;Decreased knowledge of precautions;Cardiopulmonary status limiting activity;Decreased coordination       PT Treatment Interventions DME instruction;Balance training;Gait training;Neuromuscular re-education;Stair training;Cognitive remediation;Functional mobility training;Patient/family education;Therapeutic activities;Wheelchair mobility training;Therapeutic exercise;Manual techniques    PT Goals (Current goals can be found in the Care Plan section)  Acute Rehab PT Goals Patient Stated Goal: prevent falls or deconditioning PT Goal Formulation: With patient Time For Goal Achievement: 06/14/21 Potential to Achieve Goals: Good    Frequency Min 2X/week   Barriers to discharge        Co-evaluation               AM-PAC PT "6 Clicks" Mobility  Outcome Measure Help needed turning from your back to your side while in a flat bed without using bedrails?: Total Help needed moving from lying on your back to sitting on the side of a flat bed without using bedrails?: Total Help needed moving to and from a bed to a chair (including a wheelchair)?: Total Help needed standing up from a chair using your arms (e.g., wheelchair or bedside chair)?: A Lot Help needed to  walk in hospital room?: A Little Help needed climbing 3-5 steps with a railing? : A Little 6 Click Score: 11    End of Session Equipment Utilized During Treatment: Gait belt Activity Tolerance: Patient tolerated treatment well;No increased pain;Patient limited by fatigue Patient left: in chair;with family/visitor present;with call bell/phone within reach Nurse Communication: Mobility status PT Visit Diagnosis: Difficulty in walking, not elsewhere classified (R26.2);Other abnormalities of gait and mobility (R26.89);Unsteadiness on feet (R26.81);Muscle weakness (generalized) (M62.81)    Time: 0347-4259 PT Time Calculation (min) (ACUTE ONLY): 29 min   Charges:   PT Evaluation $PT Eval Moderate Complexity: 1 Mod PT Treatments $Therapeutic Exercise: 8-22 mins       10:27 AM, 05/31/21 Rosamaria Lints, PT, DPT Physical Therapist - [facility]  [phone] (ASCOM)   Zeb Rawl C 05/31/2021, 10:27 AM

## 2021-05-31 NOTE — Assessment & Plan Note (Signed)
celexa 

## 2021-05-31 NOTE — Evaluation (Signed)
Occupational Therapy Evaluation Patient Details Name: Brandon Calhoun MRN: [id number] DOB: 03/12/1930 Today's Date: 05/31/2021   History of Present Illness Brandon Calhoun is an 89yoM who comes to [facility] on 12/13 from facility c worsening leg erythema. Pt admitted with cellulitis. DVT study negative. PMH: HTN, asthma, GERD, depression, GAD, BPH, PD, fractures left ribs 4-9. At baseline, pt has not had a fall since August 2021, pt sleeps in lift-chair at night to improve independence with nocturnal ADL activity, uses a 4WW for all mobility, walks for fitness twice daily ~3/4 mile each time.   Clinical Impression   Pt seen for OT evaluation this date in setting of acute hospitalization d/t cellulitis of L LE. He requires some LB ADL assistance at baseline, but is usually able to perform flx mobility and transfers w/o assistance with rollator. Today he requires: SETUP for UB ADLs, MOD A for seated LB ADLs. CGA For ADL transfers with RW. Pt tolerates completing one lap around nursing unit to simulate functional home distances. Will continue to follow acutely and anticipate that pt could benefit from f/u OT services in STR setting.      Recommendations for follow up therapy are one component of a multi-disciplinary discharge planning process, led by the attending physician.  Recommendations may be updated based on patient status, additional functional criteria and insurance authorization.   Follow Up Recommendations  Skilled nursing-short term rehab (<3 hours/day)    Assistance Recommended at Discharge Intermittent Supervision/Assistance  Functional Status Assessment  Patient has had a recent decline in their functional status and demonstrates the ability to make significant improvements in function in a reasonable and predictable amount of time.  Equipment Recommendations  Tub/shower seat    Recommendations for Other Services       Precautions / Restrictions Precautions Precautions:  Fall Restrictions Weight Bearing Restrictions: No      Mobility Bed Mobility               General bed mobility comments: up to chair pre/post    Transfers Overall transfer level: Needs assistance Equipment used: Rolling walker (2 wheels) Transfers: Sit to/from Stand Sit to Stand: Min guard           General transfer comment: CGA To stand from recliner with RW with increased time to scoot to edge of chair and increased time to extend hips      Balance Overall balance assessment: Needs assistance   Sitting balance-Leahy Scale: Good     Standing balance support: Reliant on assistive device for balance Standing balance-Leahy Scale: Fair                             ADL either performed or assessed with clinical judgement   ADL                                         General ADL Comments: SETUP for UB ADLs, MOD A for seated LB ADLs. CGA For ADL transfers with RW     Vision Patient Visual Report: No change from baseline       Perception     Praxis      Pertinent Vitals/Pain Pain Assessment: No/denies pain     Hand Dominance     Extremity/Trunk Assessment Upper Extremity Assessment Upper Extremity Assessment: Generalized weakness   Lower Extremity Assessment Lower Extremity Assessment:  Generalized weakness   Cervical / Trunk Assessment Cervical / Trunk Assessment: Kyphotic   Communication Communication Communication: HOH   Cognition Arousal/Alertness: Awake/alert Behavior During Therapy: WFL for tasks assessed/performed Overall Cognitive Status: Within Functional Limits for tasks assessed                                       General Comments       Exercises Other Exercises Other Exercises: OT engages pt and family in ed re: role of OT, d/c recs including f/u therapy   Shoulder Instructions      Home Living Family/patient expects to be discharged to:: Skilled nursing facility                                  Additional Comments: [facility] x1 year      Prior Functioning/Environment               Mobility Comments: walks daily twice <1 miles ADLs Comments: does boxing at [facility]        OT Problem List: Decreased range of motion;Decreased activity tolerance;Decreased coordination      OT Treatment/Interventions: Self-care/ADL training;Therapeutic exercise;DME and/or AE instruction;Therapeutic activities;Patient/family education;Balance training    OT Goals(Current goals can be found in the care plan section) Acute Rehab OT Goals Patient Stated Goal: to build back up endurance OT Goal Formulation: With patient/family Time For Goal Achievement: 06/14/21 Potential to Achieve Goals: Good ADL Goals Pt Will Transfer to Toilet: with modified independence;ambulating Pt Will Perform Toileting - Clothing Manipulation and hygiene: with modified independence;sitting/lateral leans;with adaptive equipment  OT Frequency: Min 2X/week   Barriers to D/C:            Co-evaluation              AM-PAC OT "6 Clicks" Daily Activity     Outcome Measure Help from another person eating meals?: None Help from another person taking care of personal grooming?: None Help from another person toileting, which includes using toliet, bedpan, or urinal?: A Lot Help from another person bathing (including washing, rinsing, drying)?: A Lot Help from another person to put on and taking off regular upper body clothing?: A Little Help from another person to put on and taking off regular lower body clothing?: A Lot 6 Click Score: 17   End of Session Equipment Utilized During Treatment: Gait belt;Rolling walker (2 wheels) Nurse Communication: Mobility status  Activity Tolerance: Patient tolerated treatment well Patient left: with call bell/phone within reach;in chair;with family/visitor present  OT Visit Diagnosis: Other abnormalities of gait and mobility  (R26.89);Other symptoms and signs involving the nervous system (R29.898)                Time: 1610-9604 OT Time Calculation (min): 33 min Charges:  OT General Charges $OT Visit: 1 Visit OT Evaluation $OT Eval Low Complexity: 1 Low OT Treatments $Therapeutic Activity: 8-22 mins  Rejeana Brock, MS, OTR/L ascom [phone] 05/31/21, 5:04 PM

## 2021-05-31 NOTE — TOC Initial Note (Signed)
Transition of Care [facility]) - Initial/Assessment Note    Patient Details  Name: Brandon Calhoun MRN: [id number] Date of Birth: 08/19/1929  Transition of Care [facility] CM/SW Contact:    Marlowe Sax, RN Phone Number: 05/31/2021, 8:52 AM  Clinical Narrative:  Patient is a resident at [facility] to monitor for needs and assist with DC planning                       Patient Goals and CMS Choice        Expected Discharge Plan and Services                                                Prior Living Arrangements/Services                       Activities of Daily Living Home Assistive Devices/Equipment: Eyeglasses, Built-in shower seat, Raised toilet seat with rails, Reacher, Walker (specify type) (rollator, rolling walker) ADL Screening (condition at time of admission) Patient's cognitive ability adequate to safely complete daily activities?: Yes Is the patient deaf or have difficulty hearing?: No Does the patient have difficulty seeing, even when wearing glasses/contacts?: No Does the patient have difficulty concentrating, remembering, or making decisions?: No Patient able to express need for assistance with ADLs?: Yes Does the patient have difficulty dressing or bathing?: Yes (needs assistance) Independently performs ADLs?: No Communication: Needs assistance Is this a change from baseline?: Pre-admission baseline Dressing (OT): Needs assistance Is this a change from baseline?: Pre-admission baseline Grooming: Needs assistance Is this a change from baseline?: Pre-admission baseline Feeding: Independent Is this a change from baseline?: Pre-admission baseline Bathing: Needs assistance Toileting: Needs assistance Is this a change from baseline?: Pre-admission baseline In/Out Bed: Independent with device (comment) Walks in Home: Independent with device (comment) Does the patient have difficulty walking or climbing stairs?: Yes Weakness of Legs:  None Weakness of Arms/Hands: None  Permission Sought/Granted                  Emotional Assessment              Admission diagnosis:  Cellulitis [L03.90] Left leg cellulitis [L03.116] Patient Active Problem List   Diagnosis Date Noted   Cellulitis 05/30/2021   HTN (hypertension) 01/31/2020   Depression 01/31/2020   Fall 01/31/2020   Multiple rib fractures 01/31/2020   Cellulitis of left lower leg 01/31/2020   Aspiration pneumonia (HCC) 01/31/2020   Asthma 01/31/2020   Hyponatremia 10/29/2019   Leukocytosis 10/29/2019   Accidental fall 10/29/2019   Multiple fractures of ribs, right side, initial encounter for closed fracture 10/29/2019   Generalized weakness 10/29/2019   Venous insufficiency of both lower extremities 07/03/2018   Parkinson's disease (HCC) 01/03/2017   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   GERD (gastroesophageal reflux disease)    Asthma exacerbation    Mood disorder (HCC)    PCP:  [name], MD Pharmacy:   [facility] [place], [place] - [address] [place] [address] Phone: [phone] Fax: [phone]  [facility] - [place], [place] - [address] [place] [address] Phone: [phone] Fax: [phone]     Social Determinants of Health (SDOH) Interventions    Readmission Risk Interventions No flowsheet  data found.

## 2021-06-01 LAB — COMPREHENSIVE METABOLIC PANEL
ALT: 11 U/L (ref 0–44)
AST: 18 U/L (ref 15–41)
Albumin: 3 g/dL — ABNORMAL LOW (ref 3.5–5.0)
Alkaline Phosphatase: 83 U/L (ref 38–126)
Anion gap: 9 (ref 5–15)
BUN: 21 mg/dL (ref 8–23)
CO2: 26 mmol/L (ref 22–32)
Calcium: 8.2 mg/dL — ABNORMAL LOW (ref 8.9–10.3)
Chloride: 101 mmol/L (ref 98–111)
Creatinine, Ser: 0.61 mg/dL (ref 0.61–1.24)
GFR, Estimated: >60 mL/min (ref >60)
Glucose, Bld: 98 mg/dL (ref 70–99)
Potassium: 3.6 mmol/L (ref 3.5–5.1)
Sodium: 136 mmol/L (ref 135–145)
Total Bilirubin: 0.9 mg/dL (ref 0.3–1.2)
Total Protein: 5.8 g/dL — ABNORMAL LOW (ref 6.5–8.1)

## 2021-06-01 LAB — CBC WITH DIFFERENTIAL/PLATELET
Abs Immature Granulocytes: 0.05 10*3/uL (ref 0.00–0.07)
Basophils Absolute: 0 10*3/uL (ref 0.0–0.1)
Basophils Relative: 0 %
Eosinophils Absolute: 0.2 10*3/uL (ref 0.0–0.5)
Eosinophils Relative: 2 %
HCT: 37.1 % — ABNORMAL LOW (ref 39.0–52.0)
Hemoglobin: 12.7 g/dL — ABNORMAL LOW (ref 13.0–17.0)
Immature Granulocytes: 1 %
Lymphocytes Relative: 25 %
Lymphs Abs: 2 10*3/uL (ref 0.7–4.0)
MCH: 30.5 pg (ref 26.0–34.0)
MCHC: 34.2 g/dL (ref 30.0–36.0)
MCV: 89 fL (ref 80.0–100.0)
Monocytes Absolute: 0.7 10*3/uL (ref 0.1–1.0)
Monocytes Relative: 9 %
Neutro Abs: 4.9 10*3/uL (ref 1.7–7.7)
Neutrophils Relative %: 63 %
Platelets: 238 10*3/uL (ref 150–400)
RBC: 4.17 MIL/uL — ABNORMAL LOW (ref 4.22–5.81)
RDW: 14.2 % (ref 11.5–15.5)
WBC: 7.8 10*3/uL (ref 4.0–10.5)
nRBC: 0 % (ref 0.0–0.2)

## 2021-06-01 LAB — HEMOGLOBIN A1C
Hgb A1c MFr Bld: 6 % — ABNORMAL HIGH (ref 4.8–5.6)
Mean Plasma Glucose: 126 mg/dL

## 2021-06-01 LAB — PHOSPHORUS: Phosphorus: 3.2 mg/dL (ref 2.5–4.6)

## 2021-06-01 LAB — MAGNESIUM: Magnesium: 2.1 mg/dL (ref 1.7–2.4)

## 2021-06-01 MED ORDER — DOXYCYCLINE HYCLATE 100 MG PO CAPS: 100.0000 mg | ORAL_CAPSULE | Freq: Two times a day (BID) | ORAL | 0 refills | Status: AC

## 2021-06-01 NOTE — Plan of Care (Signed)
Patient discharged from facility to [facility] via EMS. All belongings with patient at time of discharge.

## 2021-06-01 NOTE — Progress Notes (Signed)
Report called to nurse at [facility] [place].

## 2021-06-01 NOTE — Discharge Summary (Signed)
Physician Discharge Summary  Brandon Calhoun VHQ:[id number] DOB: July 07, 1929 DOA: 05/30/2021  PCP: [name], MD  Admit date: 05/30/2021 Discharge date: 06/01/2021  Time spent: 40 minutes  Recommendations for Outpatient Follow-up:  Follow outpatient CBC/CMP Follow venous insufficiency outpatient, compression stockings after completion of therapy for cellulitis Consider dermatology follow up outpatient for recurrent cellulitis, scattered skin lesions    Discharge Diagnoses:  Principal Problem:   Cellulitis of left lower leg Active Problems:   Cellulitis   Venous insufficiency of both lower extremities   Parkinson's disease (HCC)   Hyponatremia   Depression   HTN (hypertension)   Discharge Condition: stable  Diet recommendation: heart healthy  Filed Weights   05/30/21 0930  Weight: 77.1 kg    History of present illness:  Brandon Calhoun is Brandon Calhoun 87 y.o. male with past medical history of chronic venous insufficiency, GERD, hypertension, chronic anxiety from skilled nursing facility presented to hospital with worsening redness swelling of his left lower extremity.  He failed keflex and IM ceftriaxone outpatient.  He's been admitted for IV abx for cellulitis.  He improved rapidly with IV vancomycin.  Given improvement on MRSA coverage, will discharge with doxycycline.  Recommending dermatology follow up outpatient.  See below for additional details  Hospital Course:  * Cellulitis of left lower leg Based on review of images and discussion with family, rapidly improved on IV vancomycin (failed keflex and ceftriaxone outpatient) Will plan to d/c with MRSA coverage (did have negative MRSA PCR here notably, but improved on vanc) LE [place] without DVT noted I think dermatology follow up is reasonable  Venous insufficiency of both lower extremities Would recommend compression stockings after infection treated  Parkinson's disease (HCC) Sinemet, PT/OT Neurology follow up  outpatient  Hyponatremia Mild, improved  Depression celexa  HTN (hypertension) Torsemide, appears he's no longer taking spironolactone based on med rec - will d/c spiro from med list   Procedures: LE US IMPRESSION: No femoropopliteal DVT nor evidence of DVT within the visualized calf veins.   If clinical symptoms are inconsistent or if there are persistent or worsening symptoms, further imaging (possibly involving the iliac veins) may be warranted.  Consultations: none  Discharge Exam: Vitals:   06/01/21 0325 06/01/21 0732  BP: (!) 160/70 139/60  Pulse: 67 61  Resp: 15 18  Temp: 98.7 F (37.1 C) 97.6 F (36.4 C)  SpO2: 98% 96%   No complaints Daughter at bedside feels redness and swelling much improved  General: No acute distress. Cardiovascular: RRR Lungs: unlabored Abdomen: Soft, nontender, nondistended  Neurological: Alert and oriented 3. Moves all extremities 4 . Cranial nerves II through XII grossly intact. Skin: noted below, scattered what appear to be SK's Extremities: LLE with mild redness and swelling > RLE, improving gradually, no TTP  Discharge Instructions   Discharge Instructions     Call MD for:  difficulty breathing, headache or visual disturbances   Complete by: As directed    Call MD for:  extreme fatigue   Complete by: As directed    Call MD for:  hives   Complete by: As directed    Call MD for:  persistant dizziness or light-headedness   Complete by: As directed    Call MD for:  persistant nausea and vomiting   Complete by: As directed    Call MD for:  redness, tenderness, or signs of infection (pain, swelling, redness, odor or green/yellow discharge around incision site)   Complete by: As directed    Call  MD for:  severe uncontrolled pain   Complete by: As directed    Call MD for:  temperature >100.4   Complete by: As directed    Diet - low sodium heart healthy   Complete by: As directed    Discharge instructions   Complete  by: As directed    You were seen for cellulitis of your left leg.  You've improved with vancomycin (an IV antibiotic).  We'll send you home on doxycycline for another 5 days.  Elevate your legs to help with the swelling.  Once your cellulitis is resolved, you can resume use of compression stockings for your venous insufficiency.   I think dermatology follow up is reasonable.   Return for new, recurrent, or worsening symptoms.  Please ask your PCP to request records from this hospitalization so they know what was done and what the next steps will be.   Increase activity slowly   Complete by: As directed       Allergies as of 06/01/2021   No Known Allergies      Medication List     STOP taking these medications    bifidobacterium infantis capsule   cefTRIAXone  IVPB Commonly known as: ROCEPHIN   cephALEXin 500 MG capsule Commonly known as: KEFLEX   spironolactone 50 MG tablet Commonly known as: ALDACTONE       TAKE these medications    acetaminophen 325 MG tablet Commonly known as: TYLENOL Take 650 mg by mouth every 6 (six) hours as needed for mild pain.   albuterol 108 (90 Base) MCG/ACT inhaler Commonly known as: VENTOLIN HFA Inhale 2 puffs into the lungs 3 (three) times daily as needed for wheezing or shortness of breath. Shake well. Wait 1 minute between each puff.   alum & mag hydroxide-simeth 200-200-20 MG/5ML suspension Commonly known as: MAALOX/MYLANTA Take 30 mLs by mouth every 6 (six) hours as needed for heartburn.   budesonide-formoterol 160-4.5 MCG/ACT inhaler Commonly known as: SYMBICORT Inhale 2 puffs into the lungs 2 (two) times daily. Rinse mouth after use   carbidopa-levodopa 50-200 MG tablet Commonly known as: SINEMET CR Take 1 tablet by mouth in the morning, at noon, and at bedtime. 0730, 1130, 1630 What changed: Another medication with the same name was removed. Continue taking this medication, and follow the directions you see here.    cetirizine 10 MG tablet Commonly known as: ZYRTEC Take 10 mg by mouth daily.   citalopram 10 MG tablet Commonly known as: CELEXA Take 10 mg by mouth See admin instructions. Take one tablet (10 mg) once daily on Tuesday, Wednesday, Friday, Saturday and Sunday   doxycycline 100 MG capsule Commonly known as: VIBRAMYCIN Take 1 capsule (100 mg total) by mouth 2 (two) times daily for 5 days.   fluticasone 50 MCG/ACT nasal spray Commonly known as: FLONASE Place 2 sprays into the nose daily.   Milk of Magnesia 400 MG/5ML suspension Generic drug: magnesium hydroxide Take 30 mLs by mouth daily as needed for mild constipation.   montelukast 10 MG tablet Commonly known as: SINGULAIR Take 10 mg by mouth at bedtime.   OcuSoft Eyelid Cleansing Pads Apply 1 application topically at bedtime. For blepharitis for 90 days   omeprazole 20 MG capsule Commonly known as: PRILOSEC Take 20 mg by mouth daily. What changed: Another medication with the same name was removed. Continue taking this medication, and follow the directions you see here.   tamsulosin 0.4 MG Caps capsule Commonly known as: FLOMAX Take 0.4 mg  by mouth daily.   torsemide 20 MG tablet Commonly known as: DEMADEX Take 20 mg by mouth daily.   zinc oxide 20 % ointment Apply 1 application topically 2 (two) times daily.       No Known Allergies  Contact information for after-discharge care     Destination     [facility] .   Service: Skilled Nursing Contact information: [address] [place] [address] [phone]                      The results of significant diagnostics from this hospitalization (including imaging, microbiology, ancillary and laboratory) are listed below for reference.    Significant Diagnostic Studies: US Venous Img Lower Unilateral Left  Result Date: 05/30/2021 CLINICAL DATA:  Redness and swelling EXAM: LEFT LOWER EXTREMITY VENOUS DOPPLER  ULTRASOUND TECHNIQUE: Gray-scale sonography with compression, as well as color and duplex ultrasound, were performed to evaluate the deep venous system(s) from the level of the common femoral vein through the popliteal and proximal calf veins. COMPARISON:  01/31/2020 FINDINGS: VENOUS Normal compressibility of the common femoral, superficial femoral, and popliteal veins, as well as the visualized calf veins. Visualized portions of profunda femoral vein and great saphenous vein unremarkable. No filling defects to suggest DVT on grayscale or color Doppler imaging. Doppler waveforms show normal direction of venous flow, normal respiratory phasicity and response to augmentation. Limited views of the contralateral common femoral vein are unremarkable. OTHER None. Limitations: none IMPRESSION: No femoropopliteal DVT nor evidence of DVT within the visualized calf veins. If clinical symptoms are inconsistent or if there are persistent or worsening symptoms, further imaging (possibly involving the iliac veins) may be warranted. Electronically Signed   By: Corlis Leak M.D.   On: 05/30/2021 10:28    Microbiology: Recent Results (from the past 240 hour(s))  Culture, blood (routine x 2)     Status: None (Preliminary result)   Collection Time: 05/30/21  9:32 AM   Specimen: BLOOD LEFT ARM  Result Value Ref Range Status   Specimen Description BLOOD LEFT ARM  Final   Special Requests   Final    BOTTLES DRAWN AEROBIC AND ANAEROBIC Blood Culture adequate volume   Culture   Final    NO GROWTH 2 DAYS Performed at [facility], [address], [place], [place] [address]    Report Status PENDING  Incomplete  Culture, blood (routine x 2)     Status: None (Preliminary result)   Collection Time: 05/30/21  9:32 AM   Specimen: BLOOD  Result Value Ref Range Status   Specimen Description BLOOD  Final   Special Requests   Final    BOTTLES DRAWN AEROBIC AND ANAEROBIC Blood Culture adequate volume   Culture   Final     NO GROWTH 2 DAYS Performed at [facility], [address] [place], [place] [address]    Report Status PENDING  Incomplete  Resp Panel by RT-PCR (Flu Rexanna Louthan&B, Covid) Nasopharyngeal Swab     Status: None   Collection Time: 05/30/21  9:32 AM   Specimen: Nasopharyngeal Swab; Nasopharyngeal(NP) swabs in vial transport medium  Result Value Ref Range Status   SARS Coronavirus 2 by RT PCR NEGATIVE NEGATIVE Final    Comment: (NOTE) SARS-CoV-2 target nucleic acids are NOT DETECTED.  The SARS-CoV-2 RNA is generally detectable in upper respiratory specimens during the acute phase of infection. The lowest concentration of SARS-CoV-2 viral copies this assay can detect is  138 copies/mL. Larene Ascencio negative result does not preclude SARS-Cov-2 infection and should not be used as the sole basis for treatment or other patient management decisions. Clearance Chenault negative result may occur with  improper specimen collection/handling, submission of specimen other than nasopharyngeal swab, presence of viral mutation(s) within the areas targeted by this assay, and inadequate number of viral copies(<138 copies/mL). Arber Wiemers negative result must be combined with clinical observations, patient history, and epidemiological information. The expected result is Negative.  Fact Sheet for Patients:  BloggerCourse.com  Fact Sheet for Healthcare Providers:  SeriousBroker.it  This test is no t yet approved or cleared by the [place] FDA and  has been authorized for detection and/or diagnosis of SARS-CoV-2 by FDA under an Emergency Use Authorization (EUA). This EUA will remain  in effect (meaning this test can be used) for the duration of the COVID-19 declaration under Section 564(b)(1) of the Act, 21 U.S.C.section 360bbb-3(b)(1), unless the authorization is terminated  or revoked sooner.       Influenza Mellina Benison by PCR NEGATIVE NEGATIVE Final   Influenza B by PCR NEGATIVE NEGATIVE  Final    Comment: (NOTE) The Xpert Xpress SARS-CoV-2/FLU/RSV plus assay is intended as an aid in the diagnosis of influenza from Nasopharyngeal swab specimens and should not be used as Brayant Dorr sole basis for treatment. Nasal washings and aspirates are unacceptable for Xpert Xpress SARS-CoV-2/FLU/RSV testing.  Fact Sheet for Patients: BloggerCourse.com  Fact Sheet for Healthcare Providers: SeriousBroker.it  This test is not yet approved or cleared by the [place] FDA and has been authorized for detection and/or diagnosis of SARS-CoV-2 by FDA under an Emergency Use Authorization (EUA). This EUA will remain in effect (meaning this test can be used) for the duration of the COVID-19 declaration under Section 564(b)(1) of the Act, 21 U.S.C. section 360bbb-3(b)(1), unless the authorization is terminated or revoked.  Performed at [facility], [address] [place], [place] [address]   MRSA Next Gen by PCR, Nasal     Status: None   Collection Time: 05/30/21  1:33 PM   Specimen: Nasal Mucosa; Nasal Swab  Result Value Ref Range Status   MRSA by PCR Next Gen NOT DETECTED NOT DETECTED Final    Comment: (NOTE) The GeneXpert MRSA Assay (FDA approved for NASAL specimens only), is one component of Baylynn Shifflett comprehensive MRSA colonization surveillance program. It is not intended to diagnose MRSA infection nor to guide or monitor treatment for MRSA infections. Test performance is not FDA approved in patients less than 28 years old. Performed at [facility], [address] [place], [place] [address]      Labs: Basic Metabolic Panel: Recent Labs  Lab 05/30/21 0932 05/31/21 0217 06/01/21 0359  NA 132* 135 136  K 3.5 3.7 3.6  CL 101 103 101  CO2 25 26 26   GLUCOSE 140* 103* 98  BUN 22 23 21   CREATININE 0.71 0.74 0.61  CALCIUM 8.6* 8.3* 8.2*  MG  --  1.9 2.1  PHOS  --   --  3.2   Liver Function Tests: Recent Labs   Lab 05/30/21 0932 06/01/21 0359  AST 25 18  ALT 10 11  ALKPHOS 97 83  BILITOT 0.8 0.9  PROT 6.6 5.8*  ALBUMIN 3.7 3.0*   No results for input(s): LIPASE, AMYLASE in the last 168 hours. No results for input(s): AMMONIA in the last 168 hours. CBC: Recent Labs  Lab 05/30/21 0932 05/31/21 0217 06/01/21 0359  WBC 7.3 8.2 7.8  NEUTROABS 5.3  --  4.9  HGB 13.5 12.6* 12.7*  HCT 38.9* 37.0* 37.1*  MCV 89.2 88.1 89.0  PLT 251 248 238   Cardiac Enzymes: No results for input(s): CKTOTAL, CKMB, CKMBINDEX, TROPONINI in the last 168 hours. BNP: BNP (last 3 results) No results for input(s): BNP in the last 8760 hours.  ProBNP (last 3 results) No results for input(s): PROBNP in the last 8760 hours.  CBG: No results for input(s): GLUCAP in the last 168 hours.     Signed:  Lacretia Nicks MD.  [facility] 06/01/2021, 11:03 AM

## 2021-06-01 NOTE — TOC Progression Note (Signed)
Transition of Care [facility]) - Progression Note    Patient Details  Name: Brandon Calhoun MRN: [id number] Date of Birth: 1929/12/24  [facility] of [facility]) CM/SW Contact  Marlowe Sax, RN Phone Number: 06/01/2021, 1:43 PM  Clinical Narrative:   Called EMS for transport to [facility] room 516, There are 5 patients ahead of him on the list         Expected Discharge Plan and Services           Expected Discharge Date: 06/01/21                                     Social Determinants of Health (SDOH) Interventions    Readmission Risk Interventions No flowsheet data found.

## 2021-06-01 NOTE — TOC Progression Note (Signed)
Transition of Care [facility]) - Progression Note    Patient Details  Name: Brandon Calhoun MRN: [id number] Date of Birth: 1930-02-26  [facility] CM/SW Contact  Marlowe Sax, RN Phone Number: 06/01/2021, 9:03 AM  Clinical Narrative:    The patient is [facility] term, Ins approval is pending, He will still go to [facility] where he resides if denied, Will go to STR at [facility] if approved        Expected Discharge Plan and Services                                                 Social Determinants of Health (SDOH) Interventions    Readmission Risk Interventions No flowsheet data found.

## 2021-06-01 NOTE — TOC Progression Note (Signed)
Transition of Care [facility]) - Progression Note    Patient Details  Name: Brandon Calhoun MRN: [id number] Date of Birth: April 08, 1930  Transition of Care [facility]) CM/SW Contact  Marlowe Sax, RN Phone Number: 06/01/2021, 12:20 PM  Clinical Narrative:     Patient to transport to [facility] room 516 once IV ABX is finished running, EMS to be called, His daughter Darl Pikes is aware, they stated they understand that they can be billed but feels safer with EMS transport The nurse to call report to [phone]       Expected Discharge Plan and Services           Expected Discharge Date: 06/01/21                                     Social Determinants of Health (SDOH) Interventions    Readmission Risk Interventions No flowsheet data found.

## 2021-06-02 DIAGNOSIS — R2681 Unsteadiness on feet: Secondary | ICD-10-CM | POA: Diagnosis not present

## 2021-06-02 DIAGNOSIS — I87331 Chronic venous hypertension (idiopathic) with ulcer and inflammation of right lower extremity: Secondary | ICD-10-CM | POA: Diagnosis not present

## 2021-06-02 DIAGNOSIS — G20 Parkinson's disease: Secondary | ICD-10-CM | POA: Diagnosis not present

## 2021-06-02 DIAGNOSIS — L03116 Cellulitis of left lower limb: Secondary | ICD-10-CM | POA: Diagnosis not present

## 2021-06-02 DIAGNOSIS — F329 Major depressive disorder, single episode, unspecified: Secondary | ICD-10-CM | POA: Diagnosis not present

## 2021-06-02 DIAGNOSIS — R278 Other lack of coordination: Secondary | ICD-10-CM | POA: Diagnosis not present

## 2021-06-02 DIAGNOSIS — L03115 Cellulitis of right lower limb: Secondary | ICD-10-CM | POA: Diagnosis not present

## 2021-06-02 DIAGNOSIS — Z741 Need for assistance with personal care: Secondary | ICD-10-CM | POA: Diagnosis not present

## 2021-06-02 DIAGNOSIS — I872 Venous insufficiency (chronic) (peripheral): Secondary | ICD-10-CM | POA: Diagnosis not present

## 2021-06-02 DIAGNOSIS — F39 Unspecified mood [affective] disorder: Secondary | ICD-10-CM | POA: Diagnosis not present

## 2021-06-03 DIAGNOSIS — I872 Venous insufficiency (chronic) (peripheral): Secondary | ICD-10-CM | POA: Diagnosis not present

## 2021-06-03 DIAGNOSIS — R278 Other lack of coordination: Secondary | ICD-10-CM | POA: Diagnosis not present

## 2021-06-03 DIAGNOSIS — R2681 Unsteadiness on feet: Secondary | ICD-10-CM | POA: Diagnosis not present

## 2021-06-03 DIAGNOSIS — G20 Parkinson's disease: Secondary | ICD-10-CM | POA: Diagnosis not present

## 2021-06-03 DIAGNOSIS — Z741 Need for assistance with personal care: Secondary | ICD-10-CM | POA: Diagnosis not present

## 2021-06-03 DIAGNOSIS — F329 Major depressive disorder, single episode, unspecified: Secondary | ICD-10-CM | POA: Diagnosis not present

## 2021-06-03 DIAGNOSIS — I87331 Chronic venous hypertension (idiopathic) with ulcer and inflammation of right lower extremity: Secondary | ICD-10-CM | POA: Diagnosis not present

## 2021-06-03 DIAGNOSIS — L03115 Cellulitis of right lower limb: Secondary | ICD-10-CM | POA: Diagnosis not present

## 2021-06-03 DIAGNOSIS — L03116 Cellulitis of left lower limb: Secondary | ICD-10-CM | POA: Diagnosis not present

## 2021-06-03 DIAGNOSIS — F39 Unspecified mood [affective] disorder: Secondary | ICD-10-CM | POA: Diagnosis not present

## 2021-06-04 LAB — CULTURE, BLOOD (ROUTINE X 2)
Culture: NO GROWTH
Culture: NO GROWTH
Special Requests: ADEQUATE
Special Requests: ADEQUATE

## 2021-06-05 DIAGNOSIS — L03116 Cellulitis of left lower limb: Secondary | ICD-10-CM

## 2021-06-05 DIAGNOSIS — A4902 Methicillin resistant Staphylococcus aureus infection, unspecified site: Secondary | ICD-10-CM

## 2021-06-05 DIAGNOSIS — I872 Venous insufficiency (chronic) (peripheral): Secondary | ICD-10-CM

## 2021-06-16 DIAGNOSIS — L570 Actinic keratosis: Secondary | ICD-10-CM | POA: Diagnosis not present

## 2021-06-16 DIAGNOSIS — I872 Venous insufficiency (chronic) (peripheral): Secondary | ICD-10-CM | POA: Diagnosis not present

## 2021-06-19 DIAGNOSIS — L84 Corns and callosities: Secondary | ICD-10-CM | POA: Diagnosis not present

## 2021-06-19 DIAGNOSIS — B351 Tinea unguium: Secondary | ICD-10-CM | POA: Diagnosis not present

## 2021-06-19 DIAGNOSIS — R600 Localized edema: Secondary | ICD-10-CM | POA: Diagnosis not present

## 2021-06-19 DIAGNOSIS — I7091 Generalized atherosclerosis: Secondary | ICD-10-CM | POA: Diagnosis not present

## 2021-07-05 DIAGNOSIS — K219 Gastro-esophageal reflux disease without esophagitis: Secondary | ICD-10-CM

## 2021-07-05 DIAGNOSIS — G20 Parkinson's disease: Secondary | ICD-10-CM | POA: Diagnosis not present

## 2021-07-05 DIAGNOSIS — F39 Unspecified mood [affective] disorder: Secondary | ICD-10-CM | POA: Diagnosis not present

## 2021-07-05 DIAGNOSIS — I872 Venous insufficiency (chronic) (peripheral): Secondary | ICD-10-CM | POA: Diagnosis not present

## 2021-07-05 DIAGNOSIS — N401 Enlarged prostate with lower urinary tract symptoms: Secondary | ICD-10-CM | POA: Diagnosis not present

## 2021-07-05 DIAGNOSIS — J45998 Other asthma: Secondary | ICD-10-CM

## 2021-07-10 IMAGING — DX DG CHEST 1V PORT
1 series · 1 of 1 positions shown · non-contrast
Comparison: Prior radiograph from 10/29/2019.

CLINICAL DATA: Initial evaluation for acute cough.

EXAM:
PORTABLE CHEST 1 VIEW

[chest ap]
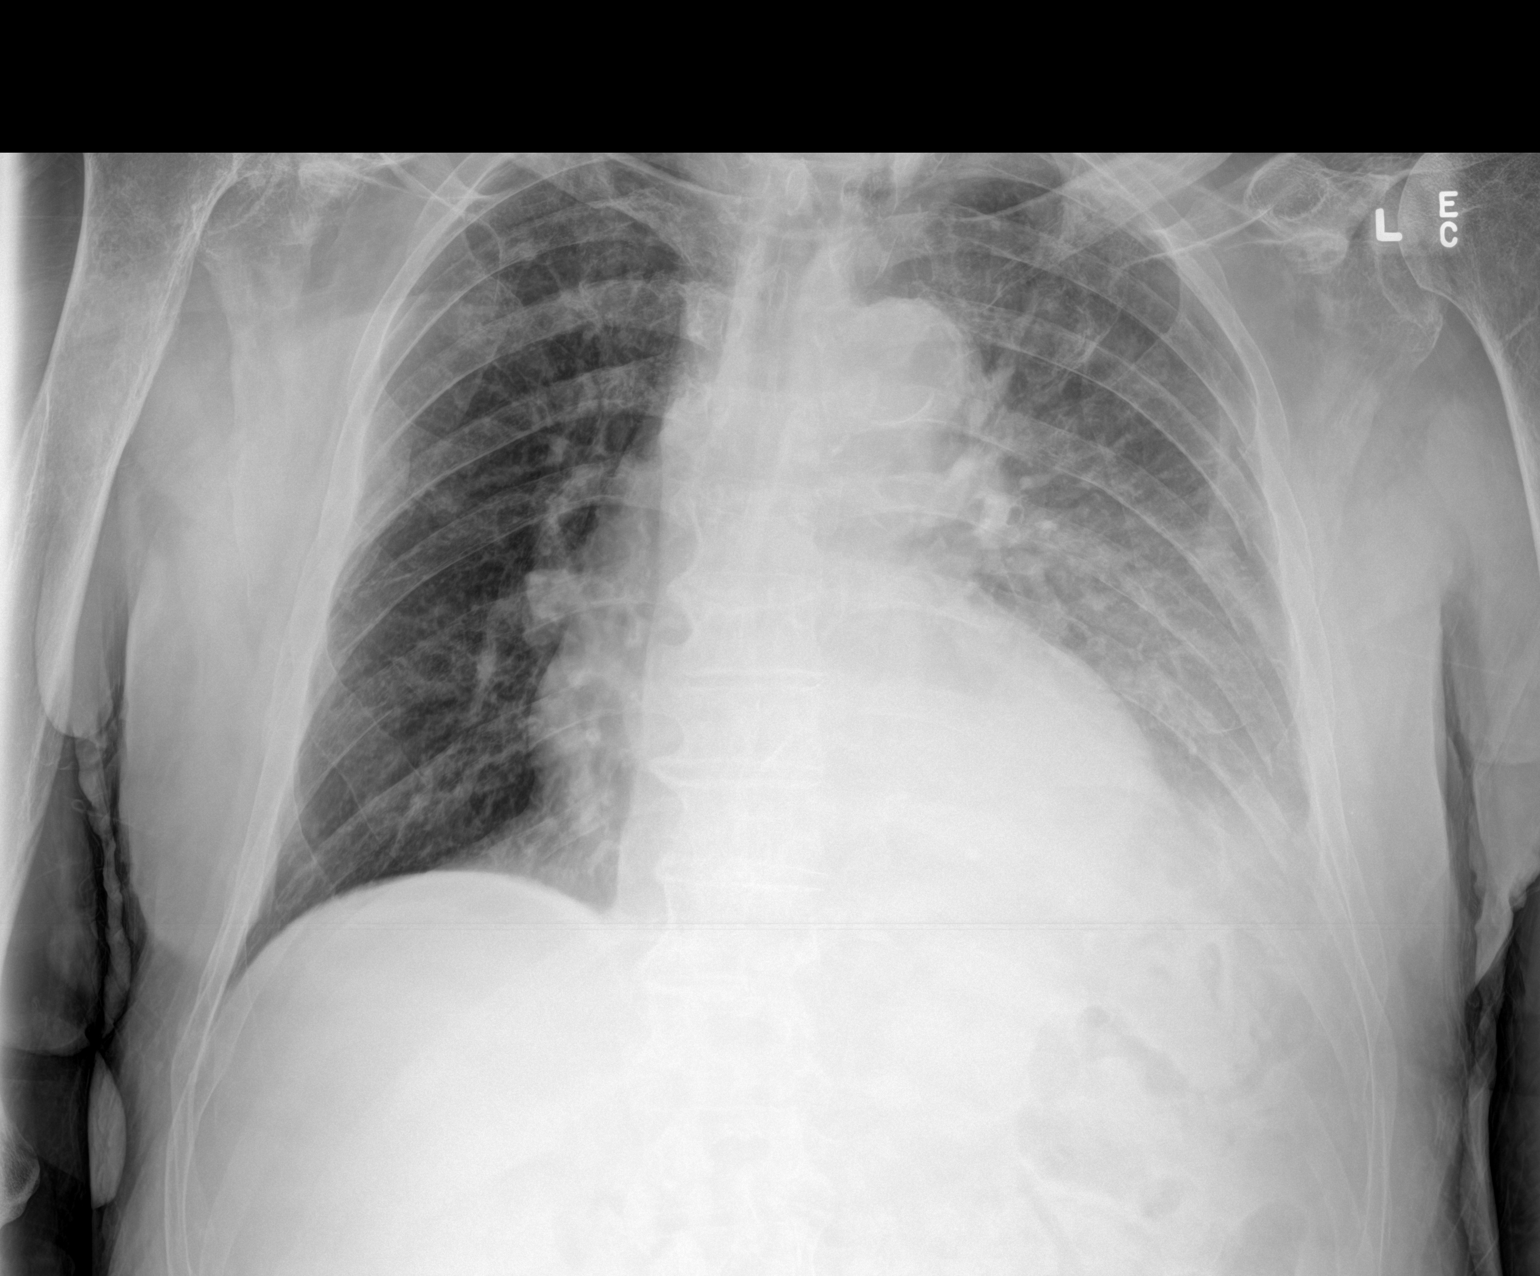

[1 of 1 positions shown; findings below may reference images not displayed]

FINDINGS: Cardiomegaly, stable. Mediastinal silhouette within normal limits.
Aortic atherosclerosis.

Lungs normally inflated. Patchy and hazy opacity seen throughout the
mid and lower left lung, suspicious for possible infiltrate given
provided history of cough. Increased vascular congestion seen within
the left lung. Associated small layering left pleural effusion.
Right lung is clear. No pneumothorax.

There are acute appearing fractures of the left posterolateral
fourth, fifth, sixth, seventh, eighth, and ninth ribs. These are new
as compared to prior radiograph.
IMPRESSION: 1. Acute appearing fractures of the left posterolateral fourth
through ninth ribs, new as compared to prior radiograph from
10/29/2019. Correlation with history and physical exam recommended.
2. Patchy and hazy opacity throughout the mid and lower left lung,
suspicious for infiltrate given the provided history of cough.
3. Associated small layering left pleural effusion.

## 2021-08-22 DIAGNOSIS — I872 Venous insufficiency (chronic) (peripheral): Secondary | ICD-10-CM | POA: Diagnosis not present

## 2021-08-22 DIAGNOSIS — L03119 Cellulitis of unspecified part of limb: Secondary | ICD-10-CM | POA: Diagnosis not present

## 2021-08-22 DIAGNOSIS — I7091 Generalized atherosclerosis: Secondary | ICD-10-CM | POA: Diagnosis not present

## 2021-08-22 DIAGNOSIS — B351 Tinea unguium: Secondary | ICD-10-CM | POA: Diagnosis not present

## 2021-08-22 DIAGNOSIS — L84 Corns and callosities: Secondary | ICD-10-CM | POA: Diagnosis not present

## 2021-08-28 DIAGNOSIS — L03116 Cellulitis of left lower limb: Secondary | ICD-10-CM

## 2021-09-11 DIAGNOSIS — G20 Parkinson's disease: Secondary | ICD-10-CM | POA: Diagnosis not present

## 2021-09-11 DIAGNOSIS — N400 Enlarged prostate without lower urinary tract symptoms: Secondary | ICD-10-CM | POA: Diagnosis not present

## 2021-09-11 DIAGNOSIS — K219 Gastro-esophageal reflux disease without esophagitis: Secondary | ICD-10-CM

## 2021-09-11 DIAGNOSIS — J45909 Unspecified asthma, uncomplicated: Secondary | ICD-10-CM | POA: Diagnosis not present

## 2021-09-11 DIAGNOSIS — I872 Venous insufficiency (chronic) (peripheral): Secondary | ICD-10-CM | POA: Diagnosis not present

## 2021-09-12 DIAGNOSIS — H0100A Unspecified blepharitis right eye, upper and lower eyelids: Secondary | ICD-10-CM | POA: Diagnosis not present

## 2021-09-12 DIAGNOSIS — H10023 Other mucopurulent conjunctivitis, bilateral: Secondary | ICD-10-CM | POA: Diagnosis not present

## 2021-09-12 DIAGNOSIS — H1132 Conjunctival hemorrhage, left eye: Secondary | ICD-10-CM | POA: Diagnosis not present

## 2021-09-12 DIAGNOSIS — H0100B Unspecified blepharitis left eye, upper and lower eyelids: Secondary | ICD-10-CM | POA: Diagnosis not present

## 2021-09-14 DIAGNOSIS — I872 Venous insufficiency (chronic) (peripheral): Secondary | ICD-10-CM | POA: Diagnosis not present

## 2021-09-21 DIAGNOSIS — I872 Venous insufficiency (chronic) (peripheral): Secondary | ICD-10-CM | POA: Diagnosis not present

## 2021-09-28 ENCOUNTER — Observation Stay
Admission: EM | Admit: 2021-09-28 | Discharge: 2021-09-29 | Disposition: A | Discharge status: Skilled Nursing Facility | Payer: Medicare Other | Attending: Internal Medicine | Admitting: Internal Medicine

## 2021-09-28 ENCOUNTER — Other Ambulatory Visit: Payer: Self-pay

## 2021-09-28 ENCOUNTER — Emergency Department: Payer: Medicare Other

## 2021-09-28 DIAGNOSIS — D649 Anemia, unspecified: Secondary | ICD-10-CM | POA: Diagnosis not present

## 2021-09-28 DIAGNOSIS — J45909 Unspecified asthma, uncomplicated: Secondary | ICD-10-CM | POA: Insufficient documentation

## 2021-09-28 DIAGNOSIS — G20B2 Parkinson's disease with dyskinesia, with fluctuations: Secondary | ICD-10-CM | POA: Diagnosis present

## 2021-09-28 DIAGNOSIS — L0390 Cellulitis, unspecified: Secondary | ICD-10-CM | POA: Diagnosis not present

## 2021-09-28 DIAGNOSIS — L03115 Cellulitis of right lower limb: Secondary | ICD-10-CM | POA: Diagnosis not present

## 2021-09-28 DIAGNOSIS — L03116 Cellulitis of left lower limb: Secondary | ICD-10-CM | POA: Insufficient documentation

## 2021-09-28 DIAGNOSIS — K219 Gastro-esophageal reflux disease without esophagitis: Secondary | ICD-10-CM | POA: Diagnosis present

## 2021-09-28 DIAGNOSIS — I10 Essential (primary) hypertension: Secondary | ICD-10-CM | POA: Diagnosis not present

## 2021-09-28 DIAGNOSIS — G20 Parkinson's disease: Secondary | ICD-10-CM | POA: Diagnosis not present

## 2021-09-28 DIAGNOSIS — L03119 Cellulitis of unspecified part of limb: Secondary | ICD-10-CM

## 2021-09-28 DIAGNOSIS — R600 Localized edema: Secondary | ICD-10-CM | POA: Diagnosis not present

## 2021-09-28 DIAGNOSIS — I872 Venous insufficiency (chronic) (peripheral): Secondary | ICD-10-CM | POA: Insufficient documentation

## 2021-09-28 DIAGNOSIS — Z79899 Other long term (current) drug therapy: Secondary | ICD-10-CM | POA: Insufficient documentation

## 2021-09-28 DIAGNOSIS — F39 Unspecified mood [affective] disorder: Secondary | ICD-10-CM | POA: Diagnosis present

## 2021-09-28 DIAGNOSIS — M79661 Pain in right lower leg: Secondary | ICD-10-CM | POA: Diagnosis present

## 2021-09-28 DIAGNOSIS — Z96642 Presence of left artificial hip joint: Secondary | ICD-10-CM | POA: Insufficient documentation

## 2021-09-28 DIAGNOSIS — W19XXXA Unspecified fall, initial encounter: Secondary | ICD-10-CM | POA: Diagnosis present

## 2021-09-28 DIAGNOSIS — R531 Weakness: Secondary | ICD-10-CM

## 2021-09-28 LAB — RETICULOCYTES
Immature Retic Fract: 11.3 % (ref 2.3–15.9)
RBC.: 4.12 MIL/uL — ABNORMAL LOW (ref 4.22–5.81)
Retic Count, Absolute: 67.2 10*3/uL (ref 19.0–186.0)
Retic Ct Pct: 1.6 % (ref 0.4–3.1)

## 2021-09-28 LAB — CBC WITH DIFFERENTIAL/PLATELET
Abs Immature Granulocytes: 0.04 10*3/uL (ref 0.00–0.07)
Basophils Absolute: 0 10*3/uL (ref 0.0–0.1)
Basophils Relative: 0 %
Eosinophils Absolute: 0.2 10*3/uL (ref 0.0–0.5)
Eosinophils Relative: 2 %
HCT: 38.5 % — ABNORMAL LOW (ref 39.0–52.0)
Hemoglobin: 12.8 g/dL — ABNORMAL LOW (ref 13.0–17.0)
Immature Granulocytes: 1 %
Lymphocytes Relative: 27 %
Lymphs Abs: 2.2 10*3/uL (ref 0.7–4.0)
MCH: 29.8 pg (ref 26.0–34.0)
MCHC: 33.2 g/dL (ref 30.0–36.0)
MCV: 89.7 fL (ref 80.0–100.0)
Monocytes Absolute: 0.8 10*3/uL (ref 0.1–1.0)
Monocytes Relative: 10 %
Neutro Abs: 5 10*3/uL (ref 1.7–7.7)
Neutrophils Relative %: 60 %
Platelets: 259 10*3/uL (ref 150–400)
RBC: 4.29 MIL/uL (ref 4.22–5.81)
RDW: 13.9 % (ref 11.5–15.5)
WBC: 8.2 10*3/uL (ref 4.0–10.5)
nRBC: 0 % (ref 0.0–0.2)

## 2021-09-28 LAB — TYPE AND SCREEN
ABO/RH(D): A NEG
Antibody Screen: NEGATIVE

## 2021-09-28 LAB — BASIC METABOLIC PANEL
Anion gap: 7 (ref 5–15)
BUN: 28 mg/dL — ABNORMAL HIGH (ref 8–23)
CO2: 29 mmol/L (ref 22–32)
Calcium: 8.6 mg/dL — ABNORMAL LOW (ref 8.9–10.3)
Chloride: 98 mmol/L (ref 98–111)
Creatinine, Ser: 1.13 mg/dL (ref 0.61–1.24)
GFR, Estimated: >60 mL/min (ref >60)
Glucose, Bld: 111 mg/dL — ABNORMAL HIGH (ref 70–99)
Potassium: 4 mmol/L (ref 3.5–5.1)
Sodium: 134 mmol/L — ABNORMAL LOW (ref 135–145)

## 2021-09-28 LAB — MRSA NEXT GEN BY PCR, NASAL: MRSA by PCR Next Gen: NOT DETECTED

## 2021-09-28 LAB — IRON AND TIBC
Iron: 70 ug/dL (ref 45–182)
Saturation Ratios: 23 % (ref 17.9–39.5)
TIBC: 305 ug/dL (ref 250–450)
UIBC: 235 ug/dL

## 2021-09-28 LAB — FERRITIN: Ferritin: 48 ng/mL (ref 24–336)

## 2021-09-28 LAB — VITAMIN B12: Vitamin B-12: 230 pg/mL (ref 180–914)

## 2021-09-28 LAB — FOLATE: Folate: 10.9 ng/mL (ref >5.9)

## 2021-09-28 MED ORDER — BISACODYL 5 MG PO TBEC
5.0000 mg | DELAYED_RELEASE_TABLET | Freq: Every day | ORAL | Status: DC | PRN
Start: 2021-09-28 — End: 2021-09-29

## 2021-09-28 MED ORDER — VANCOMYCIN HCL 1500 MG/300ML IV SOLN
1500.0000 mg | Freq: Once | INTRAVENOUS | Status: AC
  Administered 2021-09-28: 1500 mg via INTRAVENOUS
  Filled 2021-09-28: qty 300

## 2021-09-28 MED ORDER — SPIRONOLACTONE 25 MG PO TABS
25.0000 mg | ORAL_TABLET | Freq: Every day | ORAL | Status: DC
Start: 2021-09-29 — End: 2021-09-29
  Administered 2021-09-29: 25 mg via ORAL
  Filled 2021-09-28: qty 1

## 2021-09-28 MED ORDER — TAMSULOSIN HCL 0.4 MG PO CAPS
0.4000 mg | ORAL_CAPSULE | Freq: Every day | ORAL | Status: DC
  Administered 2021-09-29: 0.4 mg via ORAL
  Filled 2021-09-28: qty 1

## 2021-09-28 MED ORDER — POLYETHYLENE GLYCOL 3350 17 G PO PACK: 17.0000 g | PACK | Freq: Every day | ORAL | Status: DC | PRN

## 2021-09-28 MED ORDER — DOCUSATE SODIUM 100 MG PO CAPS
100.0000 mg | ORAL_CAPSULE | Freq: Two times a day (BID) | ORAL | Status: DC
  Administered 2021-09-28: 100 mg via ORAL
  Filled 2021-09-28 (×2): qty 1

## 2021-09-28 MED ORDER — ALBUTEROL SULFATE (2.5 MG/3ML) 0.083% IN NEBU: 3.0000 mL | INHALATION_SOLUTION | Freq: Three times a day (TID) | RESPIRATORY_TRACT | Status: DC | PRN

## 2021-09-28 MED ORDER — ACETAMINOPHEN 325 MG PO TABS: 650.0000 mg | ORAL_TABLET | Freq: Four times a day (QID) | ORAL | Status: DC | PRN

## 2021-09-28 MED ORDER — PANTOPRAZOLE SODIUM 40 MG IV SOLR
40.0000 mg | INTRAVENOUS | Status: DC
Start: 2021-09-28 — End: 2021-09-29
  Administered 2021-09-28: 40 mg via INTRAVENOUS
  Filled 2021-09-28: qty 10

## 2021-09-28 MED ORDER — MAGNESIUM HYDROXIDE 400 MG/5ML PO SUSP: 30.0000 mL | Freq: Every day | ORAL | Status: DC | PRN

## 2021-09-28 MED ORDER — MOMETASONE FURO-FORMOTEROL FUM 200-5 MCG/ACT IN AERO
2.0000 | INHALATION_SPRAY | Freq: Two times a day (BID) | RESPIRATORY_TRACT | Status: DC
Start: 2021-09-29 — End: 2021-09-29

## 2021-09-28 MED ORDER — LORATADINE 10 MG PO TABS
10.0000 mg | ORAL_TABLET | Freq: Every day | ORAL | Status: DC
  Administered 2021-09-29: 10 mg via ORAL
  Filled 2021-09-28: qty 1

## 2021-09-28 MED ORDER — FLORANEX PO PACK
1.0000 g | PACK | Freq: Three times a day (TID) | ORAL | Status: DC
  Administered 2021-09-29: 1 g via ORAL
  Filled 2021-09-28 (×2): qty 1

## 2021-09-28 MED ORDER — LACTATED RINGERS IV SOLN: INTRAVENOUS | Status: DC

## 2021-09-28 MED ORDER — VANCOMYCIN HCL IN DEXTROSE 1-5 GM/200ML-% IV SOLN: 1000.0000 mg | Freq: Once | INTRAVENOUS | Status: DC

## 2021-09-28 MED ORDER — HEPARIN SODIUM (PORCINE) 5000 UNIT/ML IJ SOLN
5000.0000 [IU] | Freq: Three times a day (TID) | INTRAMUSCULAR | Status: DC
  Administered 2021-09-28 – 2021-09-29 (×2): 5000 [IU] via SUBCUTANEOUS
  Filled 2021-09-28 (×2): qty 1

## 2021-09-28 MED ORDER — VANCOMYCIN HCL IN DEXTROSE 1-5 GM/200ML-% IV SOLN
1000.0000 mg | INTRAVENOUS | Status: DC
Start: 2021-09-29 — End: 2021-09-29

## 2021-09-28 MED ORDER — ZINC OXIDE 40 % EX OINT: 1.0000 "application " | TOPICAL_OINTMENT | Freq: Two times a day (BID) | CUTANEOUS | Status: DC | PRN

## 2021-09-28 MED ORDER — HYDRALAZINE HCL 20 MG/ML IJ SOLN: 5.0000 mg | INTRAMUSCULAR | Status: DC | PRN

## 2021-09-28 MED ORDER — SODIUM CHLORIDE 0.9 % IV SOLN
2.0000 g | Freq: Two times a day (BID) | INTRAVENOUS | Status: DC
  Administered 2021-09-28 – 2021-09-29 (×2): 2 g via INTRAVENOUS
  Filled 2021-09-28 (×2): qty 12.5

## 2021-09-28 MED ORDER — ACETAMINOPHEN 650 MG RE SUPP: 650.0000 mg | Freq: Four times a day (QID) | RECTAL | Status: DC | PRN

## 2021-09-28 MED ORDER — MONTELUKAST SODIUM 10 MG PO TABS
10.0000 mg | ORAL_TABLET | Freq: Every day | ORAL | Status: DC
  Administered 2021-09-28: 10 mg via ORAL
  Filled 2021-09-28: qty 1

## 2021-09-28 MED ORDER — CARBIDOPA-LEVODOPA ER 50-200 MG PO TBCR
1.0000 | EXTENDED_RELEASE_TABLET | Freq: Three times a day (TID) | ORAL | Status: DC
  Administered 2021-09-29: 1 via ORAL
  Filled 2021-09-28 (×2): qty 1

## 2021-09-28 MED ORDER — MORPHINE SULFATE (PF) 2 MG/ML IV SOLN: 2.0000 mg | INTRAVENOUS | Status: DC | PRN

## 2021-09-28 MED ORDER — OCUSOFT EYELID CLEANSING EX PADS: 1.0000 "application " | MEDICATED_PAD | Freq: Every day | CUTANEOUS | Status: DC

## 2021-09-28 MED ORDER — ACETAMINOPHEN 500 MG PO TABS
1000.0000 mg | ORAL_TABLET | Freq: Once | ORAL | Status: AC
  Administered 2021-09-28: 1000 mg via ORAL
  Filled 2021-09-28: qty 2

## 2021-09-28 MED ORDER — CITALOPRAM HYDROBROMIDE 20 MG PO TABS
10.0000 mg | ORAL_TABLET | ORAL | Status: DC
  Administered 2021-09-29: 10 mg via ORAL
  Filled 2021-09-28: qty 1

## 2021-09-28 MED ORDER — FLUTICASONE PROPIONATE 50 MCG/ACT NA SUSP: 2.0000 | Freq: Every day | NASAL | Status: DC

## 2021-09-28 MED ORDER — ONDANSETRON HCL 4 MG/2ML IJ SOLN: 4.0000 mg | Freq: Four times a day (QID) | INTRAMUSCULAR | Status: DC | PRN

## 2021-09-28 MED ORDER — ALUM & MAG HYDROXIDE-SIMETH 200-200-20 MG/5ML PO SUSP: 30.0000 mL | Freq: Four times a day (QID) | ORAL | Status: DC | PRN

## 2021-09-28 MED ORDER — OXYCODONE HCL 5 MG PO TABS: 5.0000 mg | ORAL_TABLET | Freq: Four times a day (QID) | ORAL | Status: DC | PRN

## 2021-09-28 MED ORDER — ONDANSETRON HCL 4 MG PO TABS: 4.0000 mg | ORAL_TABLET | Freq: Four times a day (QID) | ORAL | Status: DC | PRN

## 2021-09-28 MED ORDER — TORSEMIDE 20 MG PO TABS
10.0000 mg | ORAL_TABLET | Freq: Every day | ORAL | Status: DC
  Administered 2021-09-29: 10 mg via ORAL
  Filled 2021-09-28: qty 1

## 2021-09-28 NOTE — Assessment & Plan Note (Signed)
Continue PRN ventolin and symbiocort.  ? ?

## 2021-09-28 NOTE — Assessment & Plan Note (Addendum)
We will continue patient on carbidopa levodopa 1 tablet 3 times daily. ?

## 2021-09-28 NOTE — Assessment & Plan Note (Addendum)
Fall risk pt is a fall risk and we will start fall precaution and aspiration precaution.Marland Kitchen ?

## 2021-09-28 NOTE — Assessment & Plan Note (Addendum)
Patient started on IV antibiotics with vancomycin and cefepime in the emergency room which we will continue. ?Elevation of the legs with compression stockings. ? ? ? ? ? ? ? ?

## 2021-09-28 NOTE — ED Notes (Signed)
Pt given [place] sandwich and ginger ale given to pt, MD states this okay.  ?

## 2021-09-28 NOTE — Assessment & Plan Note (Addendum)
Per daughter chronic stable anemia.  ?We will follow cbc in am. ?

## 2021-09-28 NOTE — ED Triage Notes (Signed)
Pt presents to ED via [facility] with c/o of BLE cellulitis. Pt states he has had cellulitis in the past in the same areas. Bilateral BLE are red, swollen, and warm to the touch. Pt states this has been ongoing for "weeks". Pt states facility called EMS. Pt presented with compression socks on, those were removed for circulation of this area. Pt is A&Ox4 at this time. Pt denies any recent fever so chills.  ?

## 2021-09-28 NOTE — Assessment & Plan Note (Addendum)
Consider vascular consult for recurrent lower extremity edema and cellulitis secondary to dependent edema and venous stasis.  Discussed with daughter that this may be an option at outpatient basis. ?Continue patient on DVT prophylaxis. ? ?

## 2021-09-28 NOTE — Assessment & Plan Note (Addendum)
.    Patient is continued on as needed Zofran, Protonix. ? ?

## 2021-09-28 NOTE — Progress Notes (Signed)
PHARMACY -  BRIEF ANTIBIOTIC NOTE  ? ?Pharmacy has received consult(s) for Vancomycin from an ED provider.  The patient's profile has been reviewed for ht/wt/allergies/indication/available labs.   ? ?One time order(s) placed for Vancomycin 1500mg  IV ? ?Further antibiotics/pharmacy consults should be ordered by admitting physician if indicated.       ?                ?Thank you, ? ?09/28/2021  5:12 PM ? ?

## 2021-09-28 NOTE — ED Provider Notes (Signed)
? ?[facility] ?Provider Note ? ? ? Event Date/Time  ? First MD Initiated Contact with Patient 09/28/21 1647   ?  (approximate) ? ? ?History  ? ?Cellulitis ? ? ?HPI ? ?Brandon Calhoun is a 88 y.o. male with past medical history of Parkinson's, HTN, GERD, asthma, chronic lower extremity edema and chronic venous insufficiency of the lower extremities previously hospitalized for cellulitis in lower extremities who presents via EMS from nursing facility for evaluation of several weeks of worsening bilateral lower extremity redness and stinging pain.  Is also been more swollen.  Per patient he is on antibiotics I reviewed paperwork it seems he has been on doxycycline since 4/6.  He denies any fevers, chest pain, cough, shortness of breath, abdominal pain, vomiting, diarrhea, or any other areas of redness or pain.  He denies any recent falls or injuries.  He notes it is little more swollen on the right than the left.  No pain in the ankle or knee but seems to be all in the lower leg.  States this is similar to when he was previously hospitalized. ? ?  ?Past Medical History:  ?Diagnosis Date  ? Asthma   ? Edema   ? MILD OF FEET  ? GERD (gastroesophageal reflux disease)   ? Hypertension   ? Mood disorder (HCC)   ? chronic anxiety  ? ? ? ?Physical Exam  ?Triage Vital Signs: ?ED Triage Vitals  ?Enc Vitals Group  ?   BP 09/28/21 1654 (!) 160/69  ?   Pulse Rate 09/28/21 1654 69  ?   Resp 09/28/21 1654 19  ?   Temp 09/28/21 1654 98.1 ?F (36.7 ?C)  ?   Temp Source 09/28/21 1654 Oral  ?   SpO2 09/28/21 1654 96 %  ?   Weight 09/28/21 1656 165 lb (74.8 kg)  ?   Height 09/28/21 1656 5\' 9"  (1.753 m)  ?   Head Circumference --   ?   Peak Flow --   ?   Pain Score 09/28/21 1655 0  ?   Pain Loc --   ?   Pain Edu? --   ?   Excl. in GC? --   ? ? ?Most recent vital signs: ?Vitals:  ? 09/28/21 1654  ?BP: (!) 160/69  ?Pulse: 69  ?Resp: 19  ?Temp: 98.1 ?F (36.7 ?C)  ?SpO2: 96%  ? ? ?General: Awake, no distress.   ?CV:  Good peripheral perfusion.  2+ radial and DP pulses. ?Resp:  Normal effort.  Bilaterally. ?Abd:  No distention.  Soft. ?Other:  Bilateral lower extremity erythema edema and tenderness feeling more on the right than the left between ankle and knee without any significant bulging of either joint including any effusion or decreased range of motion or pain on range of motion.  There is no blistering or purulence.  ? ? ?ED Results / Procedures / Treatments  ?Labs ?(all labs ordered are listed, but only abnormal results are displayed) ?Labs Reviewed  ?CBC WITH DIFFERENTIAL/PLATELET - Abnormal; Notable for the following components:  ?    Result Value  ? Hemoglobin 12.8 (*)   ? HCT 38.5 (*)   ? All other components within normal limits  ?BASIC METABOLIC PANEL - Abnormal; Notable for the following components:  ? Sodium 134 (*)   ? Glucose, Bld 111 (*)   ? BUN 28 (*)   ? Calcium 8.6 (*)   ? All other components within normal limits  ?CULTURE, BLOOD (  ROUTINE X 2)  ?CULTURE, BLOOD (ROUTINE X 2)  ?MRSA NEXT GEN BY PCR, NASAL  ? ? ? ?EKG ? ? ? ?RADIOLOGY ? ?Lower extremity ultrasound shows no evidence of DVT or other clear acute process on my interpretation.  I also reviewed radiology interpretation. ? ? ?PROCEDURES: ? ?Critical Care performed: No ? ?Procedures ? ? ? ?MEDICATIONS ORDERED IN ED: ?Medications  ?vancomycin (VANCOREADY) IVPB 1500 mg/300 mL (1,500 mg Intravenous New Bag/Given 09/28/21 1727)  ?acetaminophen (TYLENOL) tablet 1,000 mg (1,000 mg Oral Given 09/28/21 1726)  ? ? ? ?IMPRESSION / MDM / ASSESSMENT AND PLAN / ED COURSE  ?I reviewed the triage vital signs and the nursing notes. ?             ?               ? ?Differential diagnosis includes, but is not limited to recurrent cellulitis in the setting of chronic venous stasis.  Also obtain ultrasound to rule out contributing DVT.  No history exam features to suggest a septic joint above or below or recent trauma.  Very low suspicion for compartment  syndrome. ? ?Lower extremity ultrasound shows no evidence of DVT or other clear acute process on my interpretation.  I also reviewed radiology interpretation.  BMP without significant electrolyte or metabolic derangements.  CBC without leukocytosis or acute anemia. ? ?Patient's daughter and family later arrived at bedside and provide some valuable additional information including that he had been on a week of Augmentin prior to transitioning to doxycycline.  I do not believe patient is septic at this point I think he has failed outpatient antibiotics and warrants admission for IV antibiotics.  I will start on vancomycin obtain blood cultures I do not believe he is septic.  We will also send a MRSA screen.  I discussed with admitting hospitalist to admit for medicine service. ? ?  ? ? ?FINAL CLINICAL IMPRESSION(S) / ED DIAGNOSES  ? ?Final diagnoses:  ?Cellulitis of lower extremity, unspecified laterality  ? ? ? ?Rx / DC Orders  ? ?ED Discharge Orders   ? ? None  ? ?  ? ? ? ?Note:  This document was prepared using Dragon voice recognition software and may include unintentional dictation errors. ?  ?Gilles Chiquito, MD ?09/28/21 1837 ? ?

## 2021-09-28 NOTE — H&P (Signed)
?History and Physical  ? ? ?Patient: Brandon Calhoun MGQ:[id number] DOB: 1929-09-11 ?DOA: 09/28/2021 ?DOS: the patient was seen and examined on 09/28/2021 ?PCP: [name], MD  ?Patient coming from: Home ? ?Chief Complaint:  ?Chief Complaint  ?Patient presents with  ? Cellulitis  ? ?HPI: Brandon Calhoun is a 88 y.o. male with medical history significant of asthma, hypertension and arthritis of both feet presenting with cellulitis affecting both his legs lower extremity venous Doppler negative for DVT patient has edema.  Per daughter at bedside the cellulitis started today it got worse.  She is not happy that he keeps recurring I discussed with her that she may have to consider a vascular consult as he has dependent edema and to continue with compression stockings.  Patient is otherwise in good health is alert awake oriented and denies any headaches blurry vision speech or gait issues abdominal pain fevers chills nausea vomiting diarrhea constipation. ? ?Review of Systems: Review of Systems  ?Unable to perform ROS: Age  ? ?Past Medical History:  ?Diagnosis Date  ? Asthma   ? Edema   ? MILD OF FEET  ? GERD (gastroesophageal reflux disease)   ? Hypertension   ? Mood disorder (HCC)   ? chronic anxiety  ? ?Past Surgical History:  ?Procedure Laterality Date  ? CATARACT EXTRACTION W/PHACO Left 02/16/2016  ? Procedure: CATARACT EXTRACTION PHACO AND INTRAOCULAR LENS PLACEMENT (IOC);  Surgeon: Nevada Crane, MD;  Location: [facility] ORS;  Service: Ophthalmology;  Laterality: Left;  [place] 1.10 ?AP% 10.6 ?CDE 7.41 ?Fluid Pack Lot # [id number]  ? CATARACT EXTRACTION W/PHACO Right 03/08/2016  ? Procedure: CATARACT EXTRACTION PHACO AND INTRAOCULAR LENS PLACEMENT (IOC);  Surgeon: Nevada Crane, MD;  Location: [facility] ORS;  Service: Ophthalmology;  Laterality: Right;  [place] 1.14 ?AP% 11.6 ?CDE 11.80 ?Fluid Pack Lot # [id number]  ? HERNIA REPAIR    ? HIP ARTHROPLASTY Left 07/06/2016  ? Procedure: ARTHROPLASTY BIPOLAR HIP  (HEMIARTHROPLASTY);  Surgeon: Kennedy Bucker, MD;  Location: [facility] ORS;  Service: Orthopedics;  Laterality: Left;  ? ?Social History:  reports that he has never smoked. He has never used smokeless tobacco. He reports that he does not drink alcohol and does not use drugs. ? ?No Known Allergies ? ?Family History  ?Problem Relation Age of Onset  ? COPD Brother   ? Hypertension Neg Hx   ? Diabetes Neg Hx   ? Heart disease Neg Hx   ? ? ?Prior to Admission medications   ?Medication Sig Start Date End Date Taking? Authorizing Provider  ?acetaminophen (TYLENOL) 325 MG tablet Take 650 mg by mouth every 6 (six) hours as needed for mild pain.    [provider]  ?albuterol (PROVENTIL HFA;VENTOLIN HFA) 108 (90 Base) MCG/ACT inhaler Inhale 2 puffs into the lungs 3 (three) times daily as needed for wheezing or shortness of breath. Shake well. Wait 1 minute between each puff.    [provider]  ?alum & mag hydroxide-simeth (MAALOX/MYLANTA) 200-200-20 MG/5ML suspension Take 30 mLs by mouth every 6 (six) hours as needed for heartburn.    [provider]  ?budesonide-formoterol (SYMBICORT) 160-4.5 MCG/ACT inhaler Inhale 2 puffs into the lungs 2 (two) times daily. Rinse mouth after use    [provider]  ?carbidopa-levodopa (SINEMET CR) 50-200 MG tablet Take 1 tablet by mouth in the morning, at noon, and at bedtime. 0730, 1130, 1630    [provider]  ?cetirizine (ZYRTEC) 10 MG tablet Take 10 mg by mouth daily.  [provider]  ?citalopram (CELEXA) 10 MG tablet Take 10 mg by mouth See admin instructions. Take one tablet (10 mg) once daily on Tuesday, Wednesday, Friday, Saturday and Sunday    [provider]  ?Eyelid Cleansers (OCUSOFT EYELID CLEANSING) PADS Apply 1 application topically at bedtime. For blepharitis for 90 days    [provider]  ?fluticasone (FLONASE) 50 MCG/ACT nasal spray Place 2 sprays into the nose daily.    [provider]   ?magnesium hydroxide (MILK OF MAGNESIA) 400 MG/5ML suspension Take 30 mLs by mouth daily as needed for mild constipation.  ?Patient not taking: Reported on 05/30/2021    [provider]  ?montelukast (SINGULAIR) 10 MG tablet Take 10 mg by mouth at bedtime.    [provider]  ?omeprazole (PRILOSEC) 20 MG capsule Take 20 mg by mouth daily.    [provider]  ?tamsulosin (FLOMAX) 0.4 MG CAPS capsule Take 0.4 mg by mouth daily.     [provider]  ?torsemide (DEMADEX) 20 MG tablet Take 20 mg by mouth daily. 04/28/21   [provider]  ?zinc oxide 20 % ointment Apply 1 application topically 2 (two) times daily. ?Patient not taking: Reported on 05/30/2021    [provider]  ? ? ?Physical Exam: ?Vitals:  ? 09/28/21 1654 09/28/21 1656  ?BP: (!) 160/69   ?Pulse: 69   ?Resp: 19   ?Temp: 98.1 ?F (36.7 ?C)   ?TempSrc: Oral   ?SpO2: 96%   ?Weight:  74.8 kg  ?Height:  5\' 9"  (1.753 m)  ?Physical Exam ?Vitals and nursing note reviewed.  ?Constitutional:   ?   General: He is not in acute distress. ?   Appearance: Normal appearance. He is not ill-appearing, toxic-appearing or diaphoretic.  ?HENT:  ?   Head: Normocephalic and atraumatic.  ?   Right Ear: Hearing and external ear normal.  ?   Left Ear: Hearing and external ear normal.  ?   Nose: Nose normal. No nasal deformity.  ?   Mouth/Throat:  ?   Lips: Pink.  ?   Mouth: Mucous membranes are moist.  ?   Tongue: No lesions.  ?   Pharynx: Oropharynx is clear.  ?Eyes:  ?   Extraocular Movements: Extraocular movements intact.  ?   Pupils: Pupils are equal, round, and reactive to light.  ?Neck:  ?   Vascular: No carotid bruit.  ?Cardiovascular:  ?   Rate and Rhythm: Normal rate and regular rhythm.  ?   Pulses: Normal pulses.  ?   Heart sounds: Normal heart sounds.  ?Pulmonary:  ?   Effort: Pulmonary effort is normal.  ?   Breath sounds: Normal breath sounds.  ?Abdominal:  ?   General: Bowel sounds are normal. There is no  distension.  ?   Palpations: Abdomen is soft. There is no mass.  ?   Tenderness: There is no abdominal tenderness. There is no guarding.  ?   Hernia: No hernia is present.  ?Musculoskeletal:     ?   General: Swelling present.  ?   Right lower leg: Edema present.  ?   Left lower leg: Edema present.  ?Skin: ?   General: Skin is warm.  ?   Findings: Erythema present.  ?Neurological:  ?   General: No focal deficit present.  ?   Mental Status: He is alert and oriented to person, place, and time.  ?   Cranial Nerves: Cranial nerves 2-12 are intact.  ?  Motor: Motor function is intact.  ?Psychiatric:     ?   Attention and Perception: Attention normal.     ?   Mood and Affect: Mood normal.     ?   Speech: Speech normal.     ?   Behavior: Behavior normal. Behavior is cooperative.     ?   Cognition and Memory: Cognition normal.  ? ? ?Data Reviewed: ?, ?Results for orders placed or performed during the hospital encounter of 09/28/21 (from the past 24 hour(s))  ?CBC with Differential     Status: Abnormal  ? Collection Time: 09/28/21  4:50 PM  ?Result Value Ref Range  ? WBC 8.2 4.0 - 10.5 K/uL  ? RBC 4.29 4.22 - 5.81 MIL/uL  ? Hemoglobin 12.8 (L) 13.0 - 17.0 g/dL  ? HCT 38.5 (L) 39.0 - 52.0 %  ? MCV 89.7 80.0 - 100.0 fL  ? MCH 29.8 26.0 - 34.0 pg  ? MCHC 33.2 30.0 - 36.0 g/dL  ? RDW 13.9 11.5 - 15.5 %  ? Platelets 259 150 - 400 K/uL  ? nRBC 0.0 0.0 - 0.2 %  ? Neutrophils Relative % 60 %  ? Neutro Abs 5.0 1.7 - 7.7 K/uL  ? Lymphocytes Relative 27 %  ? Lymphs Abs 2.2 0.7 - 4.0 K/uL  ? Monocytes Relative 10 %  ? Monocytes Absolute 0.8 0.1 - 1.0 K/uL  ? Eosinophils Relative 2 %  ? Eosinophils Absolute 0.2 0.0 - 0.5 K/uL  ? Basophils Relative 0 %  ? Basophils Absolute 0.0 0.0 - 0.1 K/uL  ? Immature Granulocytes 1 %  ? Abs Immature Granulocytes 0.04 0.00 - 0.07 K/uL  ?Basic metabolic panel     Status: Abnormal  ? Collection Time: 09/28/21  4:50 PM  ?Result Value Ref Range  ? Sodium 134 (L) 135 - 145 mmol/L  ? Potassium 4.0 3.5 - 5.1  mmol/L  ? Chloride 98 98 - 111 mmol/L  ? CO2 29 22 - 32 mmol/L  ? Glucose, Bld 111 (H) 70 - 99 mg/dL  ? BUN 28 (H) 8 - 23 mg/dL  ? Creatinine, Ser 1.13 0.61 - 1.24 mg/dL  ? Calcium 8.6 (L) 8.9 - 10.3 mg/dL  ? GFR, Estimated >60 >6

## 2021-09-28 NOTE — Consult Note (Signed)
Pharmacy Antibiotic Note ? ?Brandon Calhoun is a 88 y.o. male admitted on 09/28/2021 with cellulitis.  Pharmacy has been consulted for Vancomycin and Cefepime dosing. ? ?Plan: ?Vancomycin 1500mg  loading dose x 1 followed by  ?Vancomycin 1000 mg IV Q 24 hrs. Goal AUC 400-550. ?Expected AUC: 458.9 ?Expected Css: 11.8 ?SCr used: 1.13 ? ?Cefepime 2g IV Q 12 hours ? ? ?Height: 5\' 9"  (175.3 cm) ?Weight: 74.8 kg (165 lb) ?IBW/kg (Calculated) : 70.7 ? ?Temp (24hrs), Avg:98.1 ?F (36.7 ?C), Min:98.1 ?F (36.7 ?C), Max:98.1 ?F (36.7 ?C) ? ?Recent Labs  ?Lab 09/28/21 ?1650  ?WBC 8.2  ?CREATININE 1.13  ?  ?Estimated Creatinine Clearance: 43.4 mL/min (by C-G formula based on SCr of 1.13 mg/dL).   ? ?No Known Allergies ? ?Antimicrobials this admission: ?Vanc 4/13 >>  ?Cefepime 4/13 >>  ? ?Dose adjustments this admission: ?N/a ? ?Microbiology results: ?4/13 BCx: sent ?4/13 MRSA PCR: negative ? ?Thank you for allowing pharmacy to be a part of this patient?s care. ? ?Pearla Dubonnet ?09/28/2021 7:41 PM ? ?

## 2021-09-28 NOTE — Assessment & Plan Note (Addendum)
Blood pressure (!) 160/69, pulse 69, temperature 98.1 ?F (36.7 ?C), temperature source Oral, resp. rate 19, height 5\' 9"  (1.753 m), weight 74.8 kg, SpO2 96 %. ?We have continued patient on Aldactone and as needed hydralazine.  As needed diuretics to avoid orthostatic changes and fall. ?

## 2021-09-28 NOTE — Assessment & Plan Note (Addendum)
-  Continue citalopram 

## 2021-09-29 DIAGNOSIS — R531 Weakness: Secondary | ICD-10-CM | POA: Diagnosis not present

## 2021-09-29 DIAGNOSIS — L03119 Cellulitis of unspecified part of limb: Secondary | ICD-10-CM

## 2021-09-29 DIAGNOSIS — L03116 Cellulitis of left lower limb: Secondary | ICD-10-CM | POA: Diagnosis not present

## 2021-09-29 LAB — CBC
HCT: 38.9 % — ABNORMAL LOW (ref 39.0–52.0)
Hemoglobin: 13.1 g/dL (ref 13.0–17.0)
MCH: 30.3 pg (ref 26.0–34.0)
MCHC: 33.7 g/dL (ref 30.0–36.0)
MCV: 90 fL (ref 80.0–100.0)
Platelets: 218 10*3/uL (ref 150–400)
RBC: 4.32 MIL/uL (ref 4.22–5.81)
RDW: 13.7 % (ref 11.5–15.5)
WBC: 8.1 10*3/uL (ref 4.0–10.5)
nRBC: 0 % (ref 0.0–0.2)

## 2021-09-29 LAB — BASIC METABOLIC PANEL
Anion gap: 6 (ref 5–15)
BUN: 27 mg/dL — ABNORMAL HIGH (ref 8–23)
CO2: 27 mmol/L (ref 22–32)
Calcium: 8.2 mg/dL — ABNORMAL LOW (ref 8.9–10.3)
Chloride: 103 mmol/L (ref 98–111)
Creatinine, Ser: 0.84 mg/dL (ref 0.61–1.24)
GFR, Estimated: >60 mL/min (ref >60)
Glucose, Bld: 108 mg/dL — ABNORMAL HIGH (ref 70–99)
Potassium: 3.9 mmol/L (ref 3.5–5.1)
Sodium: 136 mmol/L (ref 135–145)

## 2021-09-29 MED ORDER — CEPHALEXIN 500 MG PO CAPS: 500.0000 mg | ORAL_CAPSULE | Freq: Two times a day (BID) | ORAL | 0 refills | Status: AC

## 2021-09-29 NOTE — Progress Notes (Signed)
[facility] Hospital Liaison note:  Notified via Epic workque from Dr. Vipul Shah of request for [facility] Palliative Care services. Will continue to follow for disposition.  Please call with any outpatient palliative questions or concerns.  Thank you for the opportunity to participate in this patient's care.  Thank you, Dee Curry, LPN [facility] Hospital Liaison [phone] 

## 2021-09-29 NOTE — TOC Initial Note (Signed)
Transition of Care (TOC) - Initial/Assessment Note  ? ? ?Patient Details  ?Name: Brandon Calhoun ?MRN: [id number] ?Date of Birth: 1929/07/01 ? ?Transition of Care (TOC) CM/SW Contact:    ?Allayne Butcher, RN ?Phone Number: ?09/29/2021, 10:10 AM ? ?Clinical Narrative:                 ?Patient placed under observation for cellulitis and weakness.  Patient is cleared for discharge back to facility today.  Patient is long term care at [facility].  Michel Santee Admission at [facility] says patient is good to return, no FL2 needed because he has not been gone for 24 hours.  Daughter is transporting patient back to [facility].    ? ?Expected Discharge Plan: Skilled Nursing Facility ?Barriers to Discharge: Barriers Resolved ? ? ?Patient Goals and CMS Choice ?Patient states their goals for this hospitalization and ongoing recovery are:: Back to [facility] ?CMS Medicare.gov Compare Post Acute Care list provided to:: Patient Represenative (must comment) ?Choice offered to / list presented to : Adult Children ? ?Expected Discharge Plan and Services ?Expected Discharge Plan: Skilled Nursing Facility ?  ?  ?Post Acute Care Choice: Resumption of Svcs/PTA Provider ?Living arrangements for the past 2 months: Skilled Nursing Facility ?Expected Discharge Date: 09/29/21               ?DME Arranged: N/A ?DME Agency: NA ?  ?  ?  ?HH Arranged: NA ?HH Agency: NA ?  ?  ?  ? ?Prior Living Arrangements/Services ?Living arrangements for the past 2 months: Skilled Nursing Facility ?Lives with:: Facility Resident ?Patient language and need for interpreter reviewed:: Yes ?Do you feel safe going back to the place where you live?: Yes      ?Need for Family Participation in Patient Care: Yes (Comment) ?Care giver support system in place?: Yes (comment) ?  ?Criminal Activity/Legal Involvement Pertinent to Current Situation/Hospitalization: No - Comment as needed ? ?Activities of Daily Living ?  ?  ? ?Permission Sought/Granted ?Permission sought to  share information with : Case Manager, Magazine features editor, Family Supports ?Permission granted to share information with : Yes, Verbal Permission Granted ? Share Information with NAME: Angelique Holm ? Permission granted to share info w AGENCY: [facility] ? Permission granted to share info w Relationship: daughter ? Permission granted to share info w Contact Information: [phone] ? ?Emotional Assessment ?  ?  ?  ?  ?Alcohol / Substance Use: Not Applicable ?Psych Involvement: No (comment) ? ?Admission diagnosis:  Cellulitis [L03.90] ?Patient Active Problem List  ? Diagnosis Date Noted  ? Anemia 09/28/2021  ? Cellulitis 05/30/2021  ? HTN (hypertension) 01/31/2020  ? Depression 01/31/2020  ? Fall 01/31/2020  ? Multiple rib fractures 01/31/2020  ? Cellulitis of left lower leg 01/31/2020  ? Aspiration pneumonia (HCC) 01/31/2020  ? Asthma 01/31/2020  ? Hyponatremia 10/29/2019  ? Leukocytosis 10/29/2019  ? Accidental fall 10/29/2019  ? Multiple fractures of ribs, right side, initial encounter for closed fracture 10/29/2019  ? Generalized weakness 10/29/2019  ? Venous insufficiency of both lower extremities 07/03/2018  ? Parkinson's disease (HCC) 01/03/2017  ? 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 GERD (gastroesophageal reflux disease)   ? Mood disorder (HCC)   ? ?PCP:  [name], MD ?Pharmacy:   ?[facility] - [place], [place] - [address] ?[address] ?[place] [address] ?Phone: [phone] Fax: [phone] ? ?[facility] - [place], [place] - [address] ?[address] ?[place] [address] ?Phone: [phone] Fax: [phone] ? ? ? ? ?Social Determinants of Health (SDOH) Interventions ?  ? ?Readmission Risk Interventions ?   ? View : No data to display.  ?  ?  ?  ? ? ? ?

## 2021-09-29 NOTE — ED Notes (Signed)
Pt helped with using urinal ?

## 2021-09-29 NOTE — Evaluation (Signed)
Physical Therapy Evaluation ?Patient Details ?Name: Brandon Calhoun ?MRN: [id number] ?DOB: 1930-03-10 ?Today's Date: 09/29/2021 ? ?History of Present Illness ? Pt is a 88 yo M presenting with edema and cellulitis affecting both his legs, lower extremity venous Doppler negative for DVT. MD assessment includes: Venous insufficiency of both lower extremities and anemia. PMH includes HTN, asthma, GERD, depression, GAD, BPH, and PD. ?  ?Clinical Impression ? Pt was pleasant and motivated to participate during the session and put forth good effort throughout. Pt reported feeling somewhat weaker than baseline subjectively but functionally was very close to baseline.  Pt required min A with bed mobility tasks but sleeps in a recliner at home.  Pt was steady with transfers and with gait and was able to amb 200 feet with a RW with relative ease and without adverse symptoms.  Both daughter and pt reported that [facility] offers a service where a staff member will walk with residents as needed as strength returns to baseline and both declined further PT services.  Will complete PT orders at this time but will reassess pt pending a change in status upon receipt of new PT orders. ? ?   ?   ? ?Recommendations for follow up therapy are one component of a multi-disciplinary discharge planning process, led by the attending physician.  Recommendations may be updated based on patient status, additional functional criteria and insurance authorization. ? ?Follow Up Recommendations No PT follow up ? ?  ?Assistance Recommended at Discharge None  ?Patient can return home with the following ? A little help with walking and/or transfers;A little help with bathing/dressing/bathroom;Assistance with cooking/housework;Direct supervision/assist for medications management;Direct supervision/assist for financial management;Assist for transportation ? ?  ?Equipment Recommendations None recommended by PT  ?Recommendations for Other Services ?    ?   ?Functional Status Assessment Patient has not had a recent decline in their functional status  ? ?  ?Precautions / Restrictions Precautions ?Precautions: Fall ?Restrictions ?Weight Bearing Restrictions: No  ? ?  ? ?Mobility ? Bed Mobility ?Overal bed mobility: Needs Assistance ?Bed Mobility: Rolling, Sidelying to Sit, Sit to Sidelying ?Rolling: Min assist ?Sidelying to sit: Min assist ?  ?  ?Sit to sidelying: Min assist ?General bed mobility comments: Log roll sequencing with min A for BLE and trunk control ?  ? ?Transfers ?Overall transfer level: Needs assistance ?Equipment used: Rolling walker (2 wheels) ?  ?  ?  ?  ?  ?  ?  ?General transfer comment: Good control and stability ?  ? ?Ambulation/Gait ?Ambulation/Gait assistance: Supervision ?Gait Distance (Feet): 200 Feet ?Assistive device: Rolling walker (2 wheels) ?Gait Pattern/deviations: Step-through pattern, Decreased step length - right, Decreased step length - left ?Gait velocity: decreased ?  ?  ?General Gait Details: Slow cadence but steady without LOB and without adverse symptoms ? ?Stairs ?  ?  ?  ?  ?  ? ?Wheelchair Mobility ?  ? ?Modified Rankin (Stroke Patients Only) ?  ? ?  ? ?Balance Overall balance assessment: Needs assistance ?  ?Sitting balance-Leahy Scale: Good ?  ?  ?Standing balance support: Bilateral upper extremity supported, During functional activity ?Standing balance-Leahy Scale: Good ?  ?  ?  ?  ?  ?  ?  ?  ?  ?  ?  ?  ?   ? ? ? ?Pertinent Vitals/Pain Pain Assessment ?Pain Assessment: No/denies pain  ? ? ?Home Living Family/patient expects to be discharged to:: Skilled nursing facility ?  ?  ?  ?  ?  ?  ?  ?  ?  ?  Additional Comments: Pt resides in the SNF at [facility]  ?  ?Prior Function Prior Level of Function : Needs assist ?  ?  ?  ?  ?  ?  ?Mobility Comments: Mod Ind amb in his room with a RW and in the facility with a Rollator, ambulates for exercise 2x/day up to 4,000 feet each, no falls in the last year ?ADLs Comments: Assist  from staff with bathing and dressing, Ind with toileting ?  ? ? ?Hand Dominance  ? Dominant Hand: Right ? ?  ?Extremity/Trunk Assessment  ? Upper Extremity Assessment ?Upper Extremity Assessment: Generalized weakness ?  ? ?Lower Extremity Assessment ?Lower Extremity Assessment: Generalized weakness ?  ? ?   ?Communication  ? Communication: HOH  ?Cognition Arousal/Alertness: Awake/alert ?Behavior During Therapy: [facility] for tasks assessed/performed ?Overall Cognitive Status: Within Functional Limits for tasks assessed ?  ?  ?  ?  ?  ?  ?  ?  ?  ?  ?  ?  ?  ?  ?  ?  ?  ?  ?  ? ?  ?General Comments   ? ?  ?Exercises    ? ?Assessment/Plan  ?  ?PT Assessment Patient does not need any further PT services  ?PT Problem List   ? ?   ?  ?PT Treatment Interventions     ? ?PT Goals (Current goals can be found in the Care Plan section)  ?Acute Rehab PT Goals ?PT Goal Formulation: All assessment and education complete, DC therapy ? ?  ?Frequency   ?  ? ? ?Co-evaluation   ?  ?  ?  ?  ? ? ?  ?AM-PAC PT "6 Clicks" Mobility  ?Outcome Measure Help needed turning from your back to your side while in a flat bed without using bedrails?: A Little ?Help needed moving from lying on your back to sitting on the side of a flat bed without using bedrails?: A Little ?Help needed moving to and from a bed to a chair (including a wheelchair)?: A Little ?Help needed standing up from a chair using your arms (e.g., wheelchair or bedside chair)?: A Little ?Help needed to walk in hospital room?: A Little ?Help needed climbing 3-5 steps with a railing? : A Little ?6 Click Score: 18 ? ?  ?End of Session Equipment Utilized During Treatment: Gait belt ?Activity Tolerance: Patient tolerated treatment well ?Patient left: in bed;with call bell/phone within reach;with family/visitor present;with bed alarm set ?Nurse Communication: Mobility status ?PT Visit Diagnosis: Difficulty in walking, not elsewhere classified (R26.2) ?  ? ?Time: [phone] ?PT Time Calculation  (min) (ACUTE ONLY): 25 min ? ? ?Charges:   PT Evaluation ?$PT Eval Moderate Complexity: 1 Mod ?  ?  ?   ? ?D. Elly Modena PT, DPT ?09/29/21, 10:42 AM ? ? ?

## 2021-10-02 ENCOUNTER — Telehealth: Payer: Self-pay

## 2021-10-02 DIAGNOSIS — I872 Venous insufficiency (chronic) (peripheral): Secondary | ICD-10-CM | POA: Diagnosis not present

## 2021-10-02 DIAGNOSIS — L03116 Cellulitis of left lower limb: Secondary | ICD-10-CM | POA: Diagnosis not present

## 2021-10-02 NOTE — Telephone Encounter (Signed)
Pt is in SNF at [facility] - tcm not needed ?

## 2021-10-03 LAB — CULTURE, BLOOD (ROUTINE X 2)
Culture: NO GROWTH
Culture: NO GROWTH

## 2021-10-09 DIAGNOSIS — I872 Venous insufficiency (chronic) (peripheral): Secondary | ICD-10-CM | POA: Diagnosis not present

## 2021-10-09 LAB — HEPATIC FUNCTION PANEL: Alkaline Phosphatase: 3.8 — AB (ref 25–125)

## 2021-10-09 LAB — COMPREHENSIVE METABOLIC PANEL
Albumin: 4.1 (ref 3.5–5.0)
Calcium: 9.4 (ref 8.7–10.7)
eGFR: 79

## 2021-10-09 LAB — BASIC METABOLIC PANEL
BUN: 29 — AB (ref 4–21)
CO2: 28 — AB (ref 13–22)
Chloride: 100 (ref 99–108)
Creatinine: 0.9 (ref 0.6–1.3)
Glucose: 107
Potassium: 4.3 mEq/L (ref 3.5–5.1)
Sodium: 137 (ref 137–147)

## 2021-10-16 DIAGNOSIS — I10 Essential (primary) hypertension: Secondary | ICD-10-CM | POA: Diagnosis not present

## 2021-10-16 LAB — HEPATIC FUNCTION PANEL
ALT: 30 U/L (ref 10–40)
AST: 26 (ref 14–40)
Alkaline Phosphatase: 118 (ref 25–125)
Bilirubin, Total: 0.7

## 2021-10-16 LAB — COMPREHENSIVE METABOLIC PANEL
Albumin: 4.2 (ref 3.5–5.0)
Calcium: 9.2 (ref 8.7–10.7)
Globulin: 3.1
eGFR: 83

## 2021-10-16 LAB — BASIC METABOLIC PANEL
BUN: 27 — AB (ref 4–21)
CO2: 26 — AB (ref 13–22)
Chloride: 97 — AB (ref 99–108)
Creatinine: 0.8 (ref 0.6–1.3)
Glucose: 80
Potassium: 4.2 mEq/L (ref 3.5–5.1)
Sodium: 134 — AB (ref 137–147)

## 2021-10-16 LAB — CBC AND DIFFERENTIAL
HCT: 42 (ref 41–53)
Hemoglobin: 14.2 (ref 13.5–17.5)
Platelets: 338 10*3/uL (ref 150–400)
WBC: 8

## 2021-10-16 LAB — CBC: RBC: 4.65 (ref 3.87–5.11)

## 2021-10-24 DIAGNOSIS — I7091 Generalized atherosclerosis: Secondary | ICD-10-CM | POA: Diagnosis not present

## 2021-10-24 DIAGNOSIS — L84 Corns and callosities: Secondary | ICD-10-CM | POA: Diagnosis not present

## 2021-10-24 DIAGNOSIS — B351 Tinea unguium: Secondary | ICD-10-CM | POA: Diagnosis not present

## 2021-10-25 DIAGNOSIS — I872 Venous insufficiency (chronic) (peripheral): Secondary | ICD-10-CM | POA: Diagnosis not present

## 2021-10-25 DIAGNOSIS — F39 Unspecified mood [affective] disorder: Secondary | ICD-10-CM | POA: Diagnosis not present

## 2021-10-25 DIAGNOSIS — K219 Gastro-esophageal reflux disease without esophagitis: Secondary | ICD-10-CM

## 2021-10-25 DIAGNOSIS — N401 Enlarged prostate with lower urinary tract symptoms: Secondary | ICD-10-CM | POA: Diagnosis not present

## 2021-10-25 DIAGNOSIS — G20 Parkinson's disease: Secondary | ICD-10-CM | POA: Diagnosis not present

## 2021-11-17 DIAGNOSIS — H0100B Unspecified blepharitis left eye, upper and lower eyelids: Secondary | ICD-10-CM | POA: Diagnosis not present

## 2021-11-17 DIAGNOSIS — H10023 Other mucopurulent conjunctivitis, bilateral: Secondary | ICD-10-CM | POA: Diagnosis not present

## 2021-11-17 DIAGNOSIS — H0100A Unspecified blepharitis right eye, upper and lower eyelids: Secondary | ICD-10-CM | POA: Diagnosis not present

## 2021-11-17 DIAGNOSIS — H1132 Conjunctival hemorrhage, left eye: Secondary | ICD-10-CM | POA: Diagnosis not present

## 2021-12-08 DIAGNOSIS — L89310 Pressure ulcer of right buttock, unstageable: Secondary | ICD-10-CM | POA: Diagnosis not present

## 2021-12-25 DIAGNOSIS — G20 Parkinson's disease: Secondary | ICD-10-CM | POA: Diagnosis not present

## 2021-12-25 DIAGNOSIS — J45909 Unspecified asthma, uncomplicated: Secondary | ICD-10-CM | POA: Diagnosis not present

## 2021-12-25 DIAGNOSIS — N400 Enlarged prostate without lower urinary tract symptoms: Secondary | ICD-10-CM | POA: Diagnosis not present

## 2021-12-25 DIAGNOSIS — K219 Gastro-esophageal reflux disease without esophagitis: Secondary | ICD-10-CM

## 2021-12-25 DIAGNOSIS — I872 Venous insufficiency (chronic) (peripheral): Secondary | ICD-10-CM | POA: Diagnosis not present

## 2021-12-28 DIAGNOSIS — B351 Tinea unguium: Secondary | ICD-10-CM | POA: Diagnosis not present

## 2021-12-28 DIAGNOSIS — I7091 Generalized atherosclerosis: Secondary | ICD-10-CM | POA: Diagnosis not present

## 2022-02-08 DIAGNOSIS — L89310 Pressure ulcer of right buttock, unstageable: Secondary | ICD-10-CM | POA: Diagnosis not present

## 2022-02-14 ENCOUNTER — Telehealth (SKILLED_NURSING_FACILITY): Payer: Medicare Other | Admitting: Student

## 2022-02-14 DIAGNOSIS — F419 Anxiety disorder, unspecified: Secondary | ICD-10-CM

## 2022-02-14 NOTE — Telephone Encounter (Signed)
Called Daughter, Brandon Calhoun, to discuss transition to me as primary care physician.   Patient was diagnosed with PD in his 95's. Change is difficult for him to process and manage. It often makes his symptoms worsen due to the stress and causes anxiety. Otherwise his health is stable, but has diabetes, hypertension, no stroke or spent the night in the hospital until he fell and broke his hip. Toilets independently, but needs help with dressing because of frozen shoulder. Often sleeps in the chair for 3 years because it's difficult for him to get to the bathroom on time.   She prefers if we talk to her for any changes in medications as his POA. Avoid these conversations with him.   He enjoys walking 3-4 laps in the morning and afternoon. He does rock steady boxing which helps with his PD symptoms on Tuesday and Thursday mornings.   I spent >15 minutes discussing plan of care for this patient

## 2022-03-01 DIAGNOSIS — N400 Enlarged prostate without lower urinary tract symptoms: Secondary | ICD-10-CM | POA: Diagnosis not present

## 2022-03-01 DIAGNOSIS — G20 Parkinson's disease: Secondary | ICD-10-CM | POA: Diagnosis not present

## 2022-03-01 DIAGNOSIS — J45909 Unspecified asthma, uncomplicated: Secondary | ICD-10-CM | POA: Diagnosis not present

## 2022-03-01 DIAGNOSIS — K219 Gastro-esophageal reflux disease without esophagitis: Secondary | ICD-10-CM

## 2022-03-01 DIAGNOSIS — I872 Venous insufficiency (chronic) (peripheral): Secondary | ICD-10-CM | POA: Diagnosis not present

## 2022-03-02 ENCOUNTER — Encounter: Payer: Self-pay | Admitting: Student

## 2022-03-02 ENCOUNTER — Non-Acute Institutional Stay (INDEPENDENT_AMBULATORY_CARE_PROVIDER_SITE_OTHER): Payer: Self-pay | Admitting: Student

## 2022-03-02 DIAGNOSIS — L98429 Non-pressure chronic ulcer of back with unspecified severity: Secondary | ICD-10-CM

## 2022-03-02 NOTE — Progress Notes (Signed)
Location:   [facility] Room Number: [id number] Place of Service:  SNF [phone]) Provider:  [name], MD  [name], MD  Patient Care Team: [name], MD as PCP - General (Family Medicine)  Extended Emergency Contact Information Primary Emergency Contact: Emilie Rutter Address: [address]          [place], [place] [address] [name] of [place] Home Phone: [phone] Mobile Phone: [phone] Relation: Daughter  Code Status:  FULL CODE Goals of care: Advanced Directive information    05/30/2021    9:31 AM  Advanced Directives  Does Patient Have a Medical Advance Directive? No  Would patient like information on creating a medical advance directive? No - Patient declined     Chief Complaint  Patient presents with   Acute Visit    Sacral wound    HPI:  Pt is a 88 y.o. male seen today for an acute visit for    Past Medical History:  Diagnosis Date   Asthma    Edema    MILD OF FEET   GERD (gastroesophageal reflux disease)    Hypertension    Mood disorder (HCC)    chronic anxiety   Past Surgical History:  Procedure Laterality Date   CATARACT EXTRACTION W/PHACO Left 02/16/2016   Procedure: CATARACT EXTRACTION PHACO AND INTRAOCULAR LENS PLACEMENT (IOC);  Surgeon: Nevada Crane, MD;  Location: [facility] ORS;  Service: Ophthalmology;  Laterality: Left;  [place] 1.10 AP% 10.6 CDE 7.41 Fluid Pack Lot # [id number] H   CATARACT EXTRACTION W/PHACO Right 03/08/2016   Procedure: CATARACT EXTRACTION PHACO AND INTRAOCULAR LENS PLACEMENT (IOC);  Surgeon: Nevada Crane, MD;  Location: [facility] ORS;  Service: Ophthalmology;  Laterality: Right;  [place] 1.14 AP% 11.6 CDE 11.80 Fluid Pack Lot # [id number]   HERNIA REPAIR     HIP ARTHROPLASTY Left 07/06/2016   Procedure: ARTHROPLASTY BIPOLAR HIP (HEMIARTHROPLASTY);  Surgeon: Kennedy Bucker, MD;  Location: [facility] ORS;  Service: Orthopedics;  Laterality: Left;    No Known Allergies  Allergies  as of 03/02/2022   No Known Allergies      Medication List        Accurate as of March 02, 2022  3:15 PM. If you have any questions, ask your nurse or doctor.          acetaminophen 325 MG tablet Commonly known as: TYLENOL Take 650 mg by mouth every 6 (six) hours as needed for mild pain.   albuterol 108 (90 Base) MCG/ACT inhaler Commonly known as: VENTOLIN HFA Inhale 2 puffs into the lungs 3 (three) times daily as needed for wheezing or shortness of breath. Shake well. Wait 1 minute between each puff.   alum & mag hydroxide-simeth 200-200-20 MG/5ML suspension Commonly known as: MAALOX/MYLANTA Take 30 mLs by mouth every 6 (six) hours as needed for heartburn.   budesonide-formoterol 160-4.5 MCG/ACT inhaler Commonly known as: SYMBICORT Inhale 2 puffs into the lungs 2 (two) times daily. Rinse mouth after use   carbidopa-levodopa 50-200 MG tablet Commonly known as: SINEMET CR Take 1 tablet by mouth in the morning, at noon, and at bedtime. 0730, 1130, 1630   cetirizine 10 MG tablet Commonly known as: ZYRTEC Take 10 mg by mouth daily.   citalopram 10 MG tablet Commonly known as: CELEXA Take 10 mg by mouth See admin instructions. Take one tablet (10 mg) once daily on Tuesday, Wednesday, Friday, Saturday and Sunday   fluticasone 50 MCG/ACT nasal spray Commonly known as: FLONASE Place 2 sprays  into the nose daily.   montelukast 10 MG tablet Commonly known as: SINGULAIR Take 10 mg by mouth at bedtime.   OcuSoft Eyelid Cleansing Pads Apply 1 application topically at bedtime. For blepharitis for 90 days   omeprazole 20 MG capsule Commonly known as: PRILOSEC Take 20 mg by mouth daily.   PEPTO-BISMOL PO Take 30 mLs by mouth every 4 (four) hours as needed.   spironolactone 50 MG tablet Commonly known as: ALDACTONE Take 50 mg by mouth daily.   tamsulosin 0.4 MG Caps capsule Commonly known as: FLOMAX Take 0.4 mg by mouth daily.   torsemide 20 MG tablet Commonly  known as: DEMADEX Take 40 mg by mouth daily.   ZINC OXIDE EX Apply topically 3 (three) times daily.        Review of Systems   There is no immunization history on file for this patient. Pertinent  Health Maintenance Due  Topic Date Due   INFLUENZA VACCINE  01/16/2022      05/31/2021    8:00 AM 05/31/2021    9:29 PM 06/01/2021   10:24 AM 09/28/2021    4:57 PM 09/29/2021    4:45 AM  Fall Risk  Patient Fall Risk Level High fall risk Moderate fall risk Moderate fall risk Moderate fall risk Low fall risk   Functional Status Survey:    Vitals:   03/02/22 1505  BP: 128/72  Pulse: 72  Resp: 20  Temp: 97.9 F (36.6 C)  SpO2: 98%  Weight: 152 lb 9.6 oz (69.2 kg)  Height: 5\' 9"  (1.753 m)   Body mass index is 22.54 kg/m. Physical Exam  Labs reviewed: Recent Labs    05/31/21 0217 06/01/21 0359 09/28/21 1650 09/29/21 0506  NA 135 136 134* 136  K 3.7 3.6 4.0 3.9  CL 103 101 98 103  CO2 26 26 29 27   GLUCOSE 103* 98 111* 108*  BUN 23 21 28* 27*  CREATININE 0.74 0.61 1.13 0.84  CALCIUM 8.3* 8.2* 8.6* 8.2*  MG 1.9 2.1  --   --   PHOS  --  3.2  --   --    Recent Labs    05/30/21 0932 06/01/21 0359  AST 25 18  ALT 10 11  ALKPHOS 97 83  BILITOT 0.8 0.9  PROT 6.6 5.8*  ALBUMIN 3.7 3.0*   Recent Labs    05/30/21 0932 05/31/21 0217 06/01/21 0359 09/28/21 1650 09/29/21 0506  WBC 7.3   < > 7.8 8.2 8.1  NEUTROABS 5.3  --  4.9 5.0  --   HGB 13.5   < > 12.7* 12.8* 13.1  HCT 38.9*   < > 37.1* 38.5* 38.9*  MCV 89.2   < > 89.0 89.7 90.0  PLT 251   < > 238 259 218   < > = values in this interval not displayed.   Lab Results  Component Value Date   TSH 1.957 02/01/2020   Lab Results  Component Value Date   HGBA1C 6.0 (H) 05/31/2021   Lab Results  Component Value Date   CHOL 165 10/31/2019   HDL 46 10/31/2019   LDLCALC 111 (H) 10/31/2019   TRIG 40 10/31/2019   CHOLHDL 3.6 10/31/2019    Significant Diagnostic Results in last 30 days:  No results  found.  Assessment/Plan There are no diagnoses linked to this encounter.   Family/ staff Communication:   Labs/tests ordered:

## 2022-03-03 ENCOUNTER — Encounter: Payer: Self-pay | Admitting: Student

## 2022-03-05 ENCOUNTER — Non-Acute Institutional Stay (SKILLED_NURSING_FACILITY): Payer: Medicare Other | Admitting: Student

## 2022-03-05 ENCOUNTER — Encounter: Payer: Self-pay | Admitting: Student

## 2022-03-05 DIAGNOSIS — L98429 Non-pressure chronic ulcer of back with unspecified severity: Secondary | ICD-10-CM

## 2022-03-05 DIAGNOSIS — G20 Parkinson's disease: Secondary | ICD-10-CM | POA: Diagnosis not present

## 2022-03-05 NOTE — Progress Notes (Signed)
Location:  [facility]) Nursing Home Room Number: [phone] Place of Service:  SNF [phone]) Provider:  Dr. Sherri Rad, MD  Patient Care Team: [name], MD as PCP - General [facility])  Extended Emergency Contact Information Primary Emergency Contact: Emilie Rutter Address: [address]          [place], [place] [address] [name] of [place] Home Phone: [phone] Mobile Phone: [phone] Relation: Daughter  Code Status:  Full Goals of care: Advanced Directive information    05/30/2021    9:31 AM  Advanced Directives  Does Patient Have a Medical Advance Directive? No  Would patient like information on creating a medical advance directive? No - Patient declined     Chief Complaint  Patient presents with   Acute Visit    Sacral Wound    HPI:  Pt is a 88 y.o. male seen today for an acute visit for a sacral wound. Patient has no pain with the area. He is taking supplementation with vitamins and protein.   He enjoys boxing classes for his parkinson's but is sad it is cancelled this week. He has trouble with increased drooling when he is stressed-- whether it's from a health or social matter.   Spoke to his daughter susan discussing  the sacral wound. He has a honeycomb and has been evaluated in the past for cushions and they are too talk so it limits his independence and mobility. It is important to maintain privacy for medications, etc. Will defer OT at this time, patient has adequate cushioning and difficulty with thicker cushions.   Advance Care Planning  Patient is a full code and would like to maintain this. Will continue conversation re: MOST form.     Past Medical History:  Diagnosis Date   Asthma    Edema    MILD OF FEET   GERD (gastroesophageal reflux disease)    Hypertension    Mood disorder (HCC)    chronic anxiety   Past Surgical History:  Procedure Laterality Date   CATARACT EXTRACTION W/PHACO Left  02/16/2016   Procedure: CATARACT EXTRACTION PHACO AND INTRAOCULAR LENS PLACEMENT (IOC);  Surgeon: Nevada Crane, MD;  Location: [facility] ORS;  Service: Ophthalmology;  Laterality: Left;  [place] 1.10 AP% 10.6 CDE 7.41 Fluid Pack Lot # [id number] H   CATARACT EXTRACTION W/PHACO Right 03/08/2016   Procedure: CATARACT EXTRACTION PHACO AND INTRAOCULAR LENS PLACEMENT (IOC);  Surgeon: Nevada Crane, MD;  Location: [facility] ORS;  Service: Ophthalmology;  Laterality: Right;  [place] 1.14 AP% 11.6 CDE 11.80 Fluid Pack Lot # [id number] H   HERNIA REPAIR     HIP ARTHROPLASTY Left 07/06/2016   Procedure: ARTHROPLASTY BIPOLAR HIP (HEMIARTHROPLASTY);  Surgeon: Kennedy Bucker, MD;  Location: [facility] ORS;  Service: Orthopedics;  Laterality: Left;    No Known Allergies  Outpatient Encounter Medications as of 03/05/2022  Medication Sig   acetaminophen (TYLENOL) 325 MG tablet Take 650 mg by mouth every 6 (six) hours as needed for mild pain.   albuterol (PROVENTIL HFA;VENTOLIN HFA) 108 (90 Base) MCG/ACT inhaler Inhale 2 puffs into the lungs 3 (three) times daily as needed for wheezing or shortness of breath. Shake well. Wait 1 minute between each puff.   alum & mag hydroxide-simeth (MAALOX/MYLANTA) 200-200-20 MG/5ML suspension Take 30 mLs by mouth every 6 (six) hours as needed for heartburn.   Bismuth Subsalicylate (PEPTO-BISMOL PO) Take 30 mLs by mouth every 4 (four) hours as needed.   budesonide-formoterol (SYMBICORT) 160-4.5 MCG/ACT inhaler Inhale 2  puffs into the lungs 2 (two) times daily. Rinse mouth after use   carbidopa-levodopa (SINEMET CR) 50-200 MG tablet Take 1 tablet by mouth in the morning, at noon, and at bedtime. 0730, 1130, 1630   cetirizine (ZYRTEC) 10 MG tablet Take 10 mg by mouth daily.   citalopram (CELEXA) 10 MG tablet Take 10 mg by mouth See admin instructions. Take one tablet (10 mg) once daily on Tuesday, Wednesday, Friday, Saturday and Sunday   Eyelid Cleansers (OCUSOFT EYELID CLEANSING) PADS Apply 1  application topically at bedtime. For blepharitis for 90 days   fluticasone (FLONASE) 50 MCG/ACT nasal spray Place 2 sprays into the nose daily.   magnesium hydroxide (MILK OF MAGNESIA) 400 MG/5ML suspension Take 30 mLs by mouth daily as needed for mild constipation.   montelukast (SINGULAIR) 10 MG tablet Take 10 mg by mouth at bedtime.   Nutritional Supplements (ARGINAID EXTRA PO) Take two times daily for wound healing.   omeprazole (PRILOSEC) 20 MG capsule Take 20 mg by mouth daily.   Protein (PROSOURCE PO) Use two times daily.   spironolactone (ALDACTONE) 50 MG tablet Take 50 mg by mouth daily.   tamsulosin (FLOMAX) 0.4 MG CAPS capsule Take 0.4 mg by mouth daily.    torsemide (DEMADEX) 20 MG tablet Take 40 mg by mouth daily.   ZINC OXIDE EX Apply topically 3 (three) times daily.   No facility-administered encounter medications on file as of 03/05/2022.    Review of Systems  Constitutional:  Negative for appetite change.  HENT:  Positive for drooling.   Respiratory:  Negative for shortness of breath.   Cardiovascular:  Negative for chest pain and leg swelling.  Gastrointestinal: Negative.     There is no immunization history on file for this patient. Pertinent  Health Maintenance Due  Topic Date Due   INFLUENZA VACCINE  01/16/2022      12 /14/2022    8:00 AM 05/31/2021    9:29 PM 06/01/2021   10:24 AM 09/28/2021    4:57 PM 09/29/2021    4:45 AM  Fall Risk  Patient Fall Risk Level High fall risk Moderate fall risk Moderate fall risk Moderate fall risk Low fall risk   Functional Status Survey:    Vitals:   03/05/22 1034  BP: 128/72  Pulse: 72  Resp: 20  Temp: 97.9 F (36.6 C)  SpO2: 98%  Weight: 152 lb (68.9 kg)  Height: 5\' 9"  (1.753 m)   Body mass index is 22.45 kg/m. Physical Exam Constitutional:      Comments: Thin standing in the restroom   HENT:     Mouth/Throat:     Comments: Copious amount of saliva from the mouth.  Cardiovascular:     Rate and Rhythm:  Normal rate.  Pulmonary:     Effort: Pulmonary effort is normal.     Breath sounds: Normal breath sounds.  Neurological:     Mental Status: He is alert and oriented to person, place, and time.  Sacral Wound:    Labs reviewed: Recent Labs    05/31/21 0217 06/01/21 0359 09/28/21 1650 09/29/21 0506 10/09/21 0000 10/16/21 0000  NA 135 136 134* 136 137 134*  K 3.7 3.6 4.0 3.9 4.3 4.2  CL 103 101 98 103 100 97*  CO2 26 26 29 27  28* 26*  GLUCOSE 103* 98 111* 108*  --   --   BUN 23 21 28* 27* 29* 27*  CREATININE 0.74 0.61 1.13 0.84 0.9 0.8  CALCIUM 8.3* 8.2* 8.6*  8.2* 9.4 9.2  MG 1.9 2.1  --   --   --   --   PHOS  --  3.2  --   --   --   --    Recent Labs    05/30/21 0932 06/01/21 0359 10/09/21 0000 10/16/21 0000  AST 25 18  --  26  ALT 10 11  --  30  ALKPHOS 97 83 3.8* 118  BILITOT 0.8 0.9  --   --   PROT 6.6 5.8*  --   --   ALBUMIN 3.7 3.0* 4.1 4.2   Recent Labs    05/30/21 0932 05/31/21 0217 06/01/21 0359 09/28/21 1650 09/29/21 0506 10/16/21 0000  WBC 7.3   < > 7.8 8.2 8.1 8.0  NEUTROABS 5.3  --  4.9 5.0  --   --   HGB 13.5   < > 12.7* 12.8* 13.1 14.2  HCT 38.9*   < > 37.1* 38.5* 38.9* 42  MCV 89.2   < > 89.0 89.7 90.0  --   PLT 251   < > 238 259 218 338   < > = values in this interval not displayed.   Lab Results  Component Value Date   TSH 1.957 02/01/2020   Lab Results  Component Value Date   HGBA1C 6.0 (H) 05/31/2021   Lab Results  Component Value Date   CHOL 165 10/31/2019   HDL 46 10/31/2019   LDLCALC 111 (H) 10/31/2019   TRIG 40 10/31/2019   CHOLHDL 3.6 10/31/2019    Significant Diagnostic Results in last 30 days:  No results found.  Assessment/Plan 1. Stage 2 skin ulcer of sacral region [facility]) Patient has decubitus ulcer of the bilateral gluteal clefts. Discussed with nursing and daughter notable improvement in the wound. Will continue routine wound care with barrier cream (Zinc Oxide) and wafer cushion. Protein and Vitamin C  Supplementation with Prosource and Arginaid daily.   2. Parkinson's disease (HCC) Symptoms well-controlled with medications sinemet 50-200 TID with meals, and daily exercise. Slight worsening of drooling in the setting of stress. Encourage continued boxing classes. Discussed advanced care planning, will plan on continued conversations with Darl Pikes after sending a copy of MOST form on mychart.     Family/ staff Communication: Nurse, Wallene Huh, and Daughter Alycia Patten ordered:  None  Coralyn Helling, MD, [facility] [phone]

## 2022-03-08 DIAGNOSIS — I7091 Generalized atherosclerosis: Secondary | ICD-10-CM | POA: Diagnosis not present

## 2022-03-08 DIAGNOSIS — B351 Tinea unguium: Secondary | ICD-10-CM | POA: Diagnosis not present

## 2022-03-26 ENCOUNTER — Encounter: Payer: Self-pay | Admitting: Student

## 2022-03-26 ENCOUNTER — Non-Acute Institutional Stay (SKILLED_NURSING_FACILITY): Payer: Medicare Other | Admitting: Student

## 2022-03-26 DIAGNOSIS — L98429 Non-pressure chronic ulcer of back with unspecified severity: Secondary | ICD-10-CM

## 2022-03-26 NOTE — Progress Notes (Signed)
Location:  Other [facility]) Nursing Home Room Number: 516-A Place of Service:  SNF [phone]) Provider:  [name], MD  Patient Care Team: [name], MD as PCP - General (Family Medicine)  Extended Emergency Contact Information Primary Emergency Contact: Emilie Rutter Address: [address]          [place], [place] [address] [name] of [place] Home Phone: [phone] Mobile Phone: [phone] Relation: Daughter  Code Status:  Full Code  Goals of care: Advanced Directive information    05/30/2021    9:31 AM  Advanced Directives  Does Patient Have a Medical Advance Directive? No  Would patient like information on creating a medical advance directive? No - Patient declined     Chief Complaint  Patient presents with  . Acute Visit    Pressure wound, vitals and medications are a reflection of [facility] system, [employer] [facility]      HPI:  Pt is a 88 y.o. male seen today for an acute visit for follow up of bilateral gluteal pressure wounds. Patient sates he is glad things are improving. He enjoys the vitamin C and protein supplementations.    Past Medical History:  Diagnosis Date  . Asthma   . Edema    MILD OF FEET  . GERD (gastroesophageal reflux disease)   . Hypertension   . Mood disorder (HCC)    chronic anxiety   Past Surgical History:  Procedure Laterality Date  . CATARACT EXTRACTION W/PHACO Left 02/16/2016   Procedure: CATARACT EXTRACTION PHACO AND INTRAOCULAR LENS PLACEMENT (IOC);  Surgeon: Nevada Crane, MD;  Location: [facility] ORS;  Service: Ophthalmology;  Laterality: Left;  [place] 1.10 AP% 10.6 CDE 7.41 Fluid Pack Lot # [id number]  . CATARACT EXTRACTION W/PHACO Right 03/08/2016   Procedure: CATARACT EXTRACTION PHACO AND INTRAOCULAR LENS PLACEMENT (IOC);  Surgeon: Nevada Crane, MD;  Location: [facility] ORS;  Service: Ophthalmology;  Laterality: Right;  [place] 1.14 AP% 11.6 CDE 11.80 Fluid Pack Lot # [id number]  . HERNIA REPAIR    . HIP  ARTHROPLASTY Left 07/06/2016   Procedure: ARTHROPLASTY BIPOLAR HIP (HEMIARTHROPLASTY);  Surgeon: Kennedy Bucker, MD;  Location: [facility] ORS;  Service: Orthopedics;  Laterality: Left;    No Known Allergies  Outpatient Encounter Medications as of 03/26/2022  Medication Sig  . acetaminophen (TYLENOL) 325 MG tablet Take 650 mg by mouth every 6 (six) hours as needed for mild pain.  Marland Kitchen albuterol (PROVENTIL HFA;VENTOLIN HFA) 108 (90 Base) MCG/ACT inhaler Inhale 2 puffs into the lungs every 8 (eight) hours as needed for wheezing or shortness of breath. Shake well. Wait 1 minute between each puff.  Marland Kitchen alum & mag hydroxide-simeth (MAALOX/MYLANTA) 200-200-20 MG/5ML suspension Take 30 mLs by mouth every 6 (six) hours as needed for heartburn.  . Bismuth Subsalicylate (PEPTO-BISMOL PO) Take 30 mLs by mouth every 4 (four) hours as needed.  . budesonide-formoterol (SYMBICORT) 160-4.5 MCG/ACT inhaler Inhale 2 puffs into the lungs 2 (two) times daily. Rinse mouth after use  . carbidopa-levodopa (SINEMET CR) 50-200 MG tablet Take 1 tablet by mouth in the morning, at noon, and at bedtime. 0730, 1130, 1630  . cetirizine (ZYRTEC) 10 MG tablet Take 10 mg by mouth daily.  . citalopram (CELEXA) 10 MG tablet Take 10 mg by mouth See admin instructions. Take one tablet (10 mg) once daily on Tuesday, Wednesday, Friday, Saturday and Sunday  . Eyelid Cleansers (OCUSOFT EYELID CLEANSING) PADS Apply 1 application topically at bedtime. For blepharitis for 90 days  . fluticasone (FLONASE)  50 MCG/ACT nasal spray Place 2 sprays into the nose daily.  . magnesium hydroxide (MILK OF MAGNESIA) 400 MG/5ML suspension Take 30 mLs by mouth daily as needed for mild constipation.  . montelukast (SINGULAIR) 10 MG tablet Take 10 mg by mouth at bedtime.  Marland Kitchen omeprazole (PRILOSEC) 20 MG capsule Take 20 mg by mouth daily.  Marland Kitchen spironolactone (ALDACTONE) 50 MG tablet Take 50 mg by mouth daily.  . tamsulosin (FLOMAX) 0.4 MG CAPS capsule Take 0.4 mg by mouth  daily.   Marland Kitchen torsemide (DEMADEX) 20 MG tablet Take 40 mg by mouth daily.  Marland Kitchen ZINC OXIDE EX Apply topically 3 (three) times daily.  . [DISCONTINUED] Nutritional Supplements (ARGINAID EXTRA PO) Take two times daily for wound healing.  . [DISCONTINUED] Protein (PROSOURCE PO) Use two times daily.   No facility-administered encounter medications on file as of 03/26/2022.    Review of Systems  All other systems reviewed and are negative.  Immunization History  Administered Date(s) Administered  . Influenza Inj Mdck Quad Pf 03/16/2016  . Influenza, High Dose Seasonal PF 03/09/2014, 03/08/2015  . Influenza-Unspecified 03/08/2015  . Tdap 03/09/2014   Pertinent  Health Maintenance Due  Topic Date Due  . INFLUENZA VACCINE  01/16/2022      05/31/2021    8:00 AM 05/31/2021    9:29 PM 06/01/2021   10:24 AM 09/28/2021    4:57 PM 09/29/2021    4:45 AM  Fall Risk  Patient Fall Risk Level High fall risk Moderate fall risk Moderate fall risk Moderate fall risk Low fall risk   Functional Status Survey:    Vitals:   03/26/22 1646  BP: (!) 108/57  Pulse: 81  Resp: 20  Temp: 98.2 F (36.8 C)  SpO2: 96%  Weight: 147 lb (66.7 kg)  Height: 5\' 9"  (1.753 m)   Body mass index is 21.71 kg/m. Physical Exam Skin:    Comments: Photo below  Neurological:     General: No focal deficit present.     Mental Status: He is alert and oriented to person, place, and time.     Labs reviewed: Recent Labs    05/31/21 0217 06/01/21 0359 09/28/21 1650 09/29/21 0506 10/09/21 0000 10/16/21 0000  NA 135 136 134* 136 137 134*  K 3.7 3.6 4.0 3.9 4.3 4.2  CL 103 101 98 103 100 97*  CO2 26 26 29 27  28* 26*  GLUCOSE 103* 98 111* 108*  --   --   BUN 23 21 28* 27* 29* 27*  CREATININE 0.74 0.61 1.13 0.84 0.9 0.8  CALCIUM 8.3* 8.2* 8.6* 8.2* 9.4 9.2  MG 1.9 2.1  --   --   --   --   PHOS  --  3.2  --   --   --   --    Recent Labs    05/30/21 0932 06/01/21 0359 10/09/21 0000 10/16/21 0000  AST 25 18   --  26  ALT 10 11  --  30  ALKPHOS 97 83 3.8* 118  BILITOT 0.8 0.9  --   --   PROT 6.6 5.8*  --   --   ALBUMIN 3.7 3.0* 4.1 4.2   Recent Labs    05/30/21 0932 05/31/21 0217 06/01/21 0359 09/28/21 1650 09/29/21 0506 10/16/21 0000  WBC 7.3   < > 7.8 8.2 8.1 8.0  NEUTROABS 5.3  --  4.9 5.0  --   --   HGB 13.5   < > 12.7* 12.8* 13.1 14.2  HCT 38.9*   < > 37.1* 38.5* 38.9* 42  MCV 89.2   < > 89.0 89.7 90.0  --   PLT 251   < > 238 259 218 338   < > = values in this interval not displayed.   Lab Results  Component Value Date   TSH 1.957 02/01/2020   Lab Results  Component Value Date   HGBA1C 6.0 (H) 05/31/2021   Lab Results  Component Value Date   CHOL 165 10/31/2019   HDL 46 10/31/2019   LDLCALC 111 (H) 10/31/2019   TRIG 40 10/31/2019   CHOLHDL 3.6 10/31/2019    Significant Diagnostic Results in last 30 days:  No results found.  Assessment/Plan 1. Stage 2 skin ulcer of sacral region [facility]) Healing stage 2 sacral ulcer. Continue Zinc Oxide Barrier cream TID.    Family/ staff Communication: Nursing staff updated  Labs/tests ordered:  none  Tomasa Rand, MD, [facility] [phone]

## 2022-04-18 DIAGNOSIS — Z961 Presence of intraocular lens: Secondary | ICD-10-CM | POA: Diagnosis not present

## 2022-04-18 DIAGNOSIS — H0100B Unspecified blepharitis left eye, upper and lower eyelids: Secondary | ICD-10-CM | POA: Diagnosis not present

## 2022-04-18 DIAGNOSIS — H0100A Unspecified blepharitis right eye, upper and lower eyelids: Secondary | ICD-10-CM | POA: Diagnosis not present

## 2022-04-18 DIAGNOSIS — H524 Presbyopia: Secondary | ICD-10-CM | POA: Diagnosis not present

## 2022-04-19 LAB — CBC: RBC: 4.91 (ref 3.87–5.11)

## 2022-04-19 LAB — CBC AND DIFFERENTIAL
HCT: 43 (ref 41–53)
Hemoglobin: 15 (ref 13.5–17.5)
Neutrophils Absolute: 5601
Platelets: 319 10*3/uL (ref 150–400)
WBC: 7.9

## 2022-04-19 LAB — BASIC METABOLIC PANEL
BUN: 32 — AB (ref 4–21)
CO2: 30 — AB (ref 13–22)
Chloride: 100 (ref 99–108)
Creatinine: 0.9 (ref 0.6–1.3)
Glucose: 87
Potassium: 4.2 mEq/L (ref 3.5–5.1)
Sodium: 138 (ref 137–147)

## 2022-04-19 LAB — COMPREHENSIVE METABOLIC PANEL
Albumin: 4.1 (ref 3.5–5.0)
Calcium: 9.2 (ref 8.7–10.7)
Globulin: 3.1

## 2022-04-19 LAB — HEPATIC FUNCTION PANEL
ALT: 16 U/L (ref 10–40)
AST: 19 (ref 14–40)
Alkaline Phosphatase: 94 (ref 25–125)
Bilirubin, Total: 0.5

## 2022-04-23 ENCOUNTER — Non-Acute Institutional Stay (SKILLED_NURSING_FACILITY): Payer: Medicare Other | Admitting: Student

## 2022-04-23 ENCOUNTER — Encounter: Payer: Self-pay | Admitting: Student

## 2022-04-23 DIAGNOSIS — G20B1 Parkinson's disease with dyskinesia, without mention of fluctuations: Secondary | ICD-10-CM | POA: Diagnosis not present

## 2022-04-23 DIAGNOSIS — I10 Essential (primary) hypertension: Secondary | ICD-10-CM | POA: Diagnosis not present

## 2022-04-23 DIAGNOSIS — E46 Unspecified protein-calorie malnutrition: Secondary | ICD-10-CM

## 2022-04-23 DIAGNOSIS — J309 Allergic rhinitis, unspecified: Secondary | ICD-10-CM

## 2022-04-23 DIAGNOSIS — F419 Anxiety disorder, unspecified: Secondary | ICD-10-CM

## 2022-04-23 DIAGNOSIS — L98429 Non-pressure chronic ulcer of back with unspecified severity: Secondary | ICD-10-CM

## 2022-04-23 DIAGNOSIS — Z7189 Other specified counseling: Secondary | ICD-10-CM

## 2022-04-23 DIAGNOSIS — D649 Anemia, unspecified: Secondary | ICD-10-CM

## 2022-04-23 DIAGNOSIS — Z66 Do not resuscitate: Secondary | ICD-10-CM

## 2022-04-23 DIAGNOSIS — K219 Gastro-esophageal reflux disease without esophagitis: Secondary | ICD-10-CM

## 2022-04-23 DIAGNOSIS — F39 Unspecified mood [affective] disorder: Secondary | ICD-10-CM

## 2022-04-23 NOTE — Progress Notes (Unsigned)
Location:  Other [facility]) Nursing Home Room Number: 516-A Place of Service:  SNF [phone]) Provider:  [name], MD  Patient Care Team: [name], MD as PCP - General (Family Medicine)  Extended Emergency Contact Information Primary Emergency Contact: Emilie Rutter Address: [address]          [place], [place] [address] [name] of [place] Home Phone: [phone] Mobile Phone: [phone] Relation: Daughter  Code Status:  DNR Goals of care: Advanced Directive information    04/23/2022    1:11 PM  Advanced Directives  Does Patient Have a Medical Advance Directive? Yes  Type of Advance Directive Out of facility DNR (pink MOST or yellow form)  Does patient want to make changes to medical advance directive? No - Patient declined  Pre-existing out of facility DNR order (yellow form or pink MOST form) Pink MOST form placed in chart (order not valid for inpatient use)     Chief Complaint  Patient presents with   Medical Management of Chronic Issues    Routine visit. Discuss need for AWV, shingrix, and PCV or post pone if patient refuses. Vitals and medications are a reflection of [facility] system, [employer].     HPI:  Pt is a 88 y.o. male seen today for medical management of chronic diseases.   Patient is doing well today. He has concern that his health is declining. He takes 20 minutes to use the bathroom at night which can impact his sleep. His HVAC has been broken which has made temperature control difficult. He was raised in foster care as a kid and those challenges continue to bother him in his older age.   Additional history during conversation with his daughter, Darl Pikes:  She is seeing a decline with him. Partly with PD. Some of it is emotional. He is communicating that he has challenging with still "being here." He isn't sleeping at night. He hasn't been comfortable with the heat being out.   He tells her that he gets up in the middle of  the night - thinking it's time to get ready for the day. He feels disoriented to time and place. Struggles with differentiating dreams from being awake. Denies restless leg symptoms. Tyelnol helped but then he wet himself.   Fixation on his bottom wounds - would like less imaging of his bottom. Would like to decrease anxiety provoking situations. The last three weeks he has been morose - he says "I'm one fall away from being dead." He's steady on his feet. In 2003, they stated hydration, pain relief. He has been anxious, and shakes with confusion. Not processing as well. Dad is to the point where when he goes to the hospital he loses function.   He has expressed for many years that his preference   Too many questions make him say yes  Advance Care Planning  Patient has paperwork from his will 20 years ago that he would not like chest compressions or intubation. Will update his code status to DNR per patient and family preference at this time. MOST form also completed and input under [facility].      Past Medical History:  Diagnosis Date   Asthma    Edema    MILD OF FEET   GERD (gastroesophageal reflux disease)    Hypertension    Mood disorder (HCC)    chronic anxiety   Past Surgical History:  Procedure Laterality Date   CATARACT EXTRACTION W/PHACO Left 02/16/2016   Procedure: CATARACT  EXTRACTION PHACO AND INTRAOCULAR LENS PLACEMENT (IOC);  Surgeon: Nevada Crane, MD;  Location: [facility] ORS;  Service: Ophthalmology;  Laterality: Left;  [place] 1.10 AP% 10.6 CDE 7.41 Fluid Pack Lot # [id number]   CATARACT EXTRACTION W/PHACO Right 03/08/2016   Procedure: CATARACT EXTRACTION PHACO AND INTRAOCULAR LENS PLACEMENT (IOC);  Surgeon: Nevada Crane, MD;  Location: [facility] ORS;  Service: Ophthalmology;  Laterality: Right;  [place] 1.14 AP% 11.6 CDE 11.80 Fluid Pack Lot # [id number]   HERNIA REPAIR     HIP ARTHROPLASTY Left 07/06/2016   Procedure: ARTHROPLASTY BIPOLAR HIP (HEMIARTHROPLASTY);  Surgeon: Kennedy Bucker, MD;  Location: [facility] ORS;  Service: Orthopedics;  Laterality: Left;    No Known Allergies  Outpatient Encounter Medications as of 04/23/2022  Medication Sig   acetaminophen (TYLENOL) 325 MG tablet Take 650 mg by mouth every 6 (six) hours as needed for mild pain.   alum & mag hydroxide-simeth (MAALOX/MYLANTA) 200-200-20 MG/5ML suspension Take 30 mLs by mouth every 6 (six) hours as needed for heartburn.   Bismuth Subsalicylate (PEPTO-BISMOL PO) Take 30 mLs by mouth every 4 (four) hours as needed.   budesonide-formoterol (SYMBICORT) 160-4.5 MCG/ACT inhaler Inhale 2 puffs into the lungs 2 (two) times daily. Rinse mouth after use   carbidopa-levodopa (SINEMET CR) 50-200 MG tablet Take 1 tablet by mouth 3 (three) times daily before meals. 0730, 1130, 1630   cetirizine (ZYRTEC) 10 MG tablet Take 10 mg by mouth daily.   citalopram (CELEXA) 10 MG tablet Take 10 mg by mouth See admin instructions. Take one tablet (10 mg) once daily on Tuesday, Wednesday, Friday, Saturday and Sunday   Eyelid Cleansers (OCUSOFT EYELID CLEANSING) PADS Apply 1 application topically at bedtime. For blepharitis for 90 days   fluticasone (FLONASE) 50 MCG/ACT nasal spray Place 2 sprays into the nose daily.   Infant Care Products (DERMACLOUD) OINT Apply 1 Application topically 3 (three) times daily.   magnesium hydroxide (MILK OF MAGNESIA) 400 MG/5ML suspension Take 30 mLs by mouth daily as needed for mild constipation.   melatonin 3 MG TABS tablet Take 3 mg by mouth at bedtime.   montelukast (SINGULAIR) 10 MG tablet Take 10 mg by mouth at bedtime.   omeprazole (PRILOSEC) 20 MG capsule Take 20 mg by mouth daily.   spironolactone (ALDACTONE) 50 MG tablet Take 50 mg by mouth daily.   tamsulosin (FLOMAX) 0.4 MG CAPS capsule Take 0.4 mg by mouth daily.    torsemide (DEMADEX) 20 MG tablet Take 40 mg by mouth daily.   [DISCONTINUED] albuterol (PROVENTIL HFA;VENTOLIN HFA) 108 (90 Base) MCG/ACT inhaler Inhale 2 puffs into the lungs  every 8 (eight) hours as needed for wheezing or shortness of breath. Shake well. Wait 1 minute between each puff.   [DISCONTINUED] ZINC OXIDE EX Apply topically 3 (three) times daily.   No facility-administered encounter medications on file as of 04/23/2022.    Review of Systems  All other systems reviewed and are negative.   Immunization History  Administered Date(s) Administered   Influenza Inj Mdck Quad Pf 03/16/2016   Influenza, High Dose Seasonal PF 03/09/2014, 03/08/2015, 04/03/2022   Influenza-Unspecified 03/08/2015   Moderna Covid-19 Vaccine Bivalent Booster 18yrs & up 11/14/2021   Moderna SARS-COV2 Booster Vaccination 11/04/2020   Moderna Sars-Covid-2 Vaccination 06/29/2019, 07/27/2019   Pfizer Covid-19 Vaccine Bivalent Booster 12yrs & up 03/10/2021   Tdap 03/09/2014   Pertinent  Health Maintenance Due  Topic Date Due   INFLUENZA VACCINE  Completed      12 /14/2022  8:00 AM 05/31/2021    9:29 PM 06/01/2021   10:24 AM 09/28/2021    4:57 PM 09/29/2021    4:45 AM  Fall Risk  Patient Fall Risk Level High fall risk Moderate fall risk Moderate fall risk Moderate fall risk Low fall risk   Functional Status Survey:    Vitals:   04/23/22 1247  BP: 122/66  Pulse: 77  Weight: 143 lb 9.6 oz (65.1 kg)  Height: 5\' 9"  (1.753 m)   Body mass index is 21.21 kg/m. Physical Exam Constitutional:      Comments: Thin, frail  Cardiovascular:     Rate and Rhythm: Normal rate and regular rhythm.     Pulses: Normal pulses.  Pulmonary:     Effort: Pulmonary effort is normal.     Breath sounds: Normal breath sounds.  Abdominal:     General: Abdomen is flat.     Palpations: Abdomen is soft.  Skin:    General: Skin is warm.  Neurological:     Mental Status: He is alert and oriented to person, place, and time.     Labs reviewed: Recent Labs    05/31/21 0217 06/01/21 0359 09/28/21 1650 09/29/21 0506 10/09/21 0000 10/16/21 0000 04/19/22 0000  NA 135 136 134* 136 137  134* 138  K 3.7 3.6 4.0 3.9 4.3 4.2 4.2  CL 103 101 98 103 100 97* 100  CO2 26 26 29 27  28* 26* 30*  GLUCOSE 103* 98 111* 108*  --   --   --   BUN 23 21 28* 27* 29* 27* 32*  CREATININE 0.74 0.61 1.13 0.84 0.9 0.8 0.9  CALCIUM 8.3* 8.2* 8.6* 8.2* 9.4 9.2 9.2  MG 1.9 2.1  --   --   --   --   --   PHOS  --  3.2  --   --   --   --   --    Recent Labs    05/30/21 0932 06/01/21 0359 10/09/21 0000 10/16/21 0000 04/19/22 0000  AST 25 18  --  26 19  ALT 10 11  --  30 16  ALKPHOS 97 83 3.8* 118 94  BILITOT 0.8 0.9  --   --   --   PROT 6.6 5.8*  --   --   --   ALBUMIN 3.7 3.0* 4.1 4.2 4.1   Recent Labs    06/01/21 0359 09/28/21 1650 09/29/21 0506 10/16/21 0000 04/19/22 0000  WBC 7.8 8.2 8.1 8.0 7.9  NEUTROABS 4.9 5.0  --   --  5,601.00  HGB 12.7* 12.8* 13.1 14.2 15.0  HCT 37.1* 38.5* 38.9* 42 43  MCV 89.0 89.7 90.0  --   --   PLT 238 259 218 338 319   Lab Results  Component Value Date   TSH 1.957 02/01/2020   Lab Results  Component Value Date   HGBA1C 6.0 (H) 05/31/2021   Lab Results  Component Value Date   CHOL 165 10/31/2019   HDL 46 10/31/2019   LDLCALC 111 (H) 10/31/2019   TRIG 40 10/31/2019   CHOLHDL 3.6 10/31/2019    Significant Diagnostic Results in last 30 days:  No results found.  Assessment/Plan 1. Parkinson's disease with dyskinesia, unspecified whether manifestations fluctuate Movement aspects of parkinson's stable. Rigidity still present in bilateral upper extremities. He walks 2 laps around the facility multiple times per day. No recent falls. He has lost weight, has had bad dreams, and has had increasing anxiety due to changes  in his processing skills. Discussed at length with patient and daughter regarding his current status. Weight loss and memory changes are signs of progression. Most distressing aspect for patient is how cognizant he is of his changes in status. Discussed sleep, and patient values his mobility and given baseline increased fall risk  would like to avoid medications that can contribute to fall risk. Will start melatonin 3 mg nightly to help with sleep. Discussed importance of routine. Discuss with nursing behavioral changes that may help him sleep better at night. Continue Sinemet. Continue Celexa for anxiety. Will consider changing med in the future since it is an activating anxiety medication. Given patient's decline, discussed GOC and they would like to change code status to DNR as well as completed a MOST form which is scanned into chart under Media tab.   2. Primary hypertension BP wlel-controlled. Continue Torsemide and spironolactone at this time.   3. Stage 2 skin ulcer of sacral region ([place]) Wound has gone back and forth. Difficult healing given weight loss. Continue wound care recs.   4. Protein deficiency ([place]) Per nutritionist, "Down 8.1% x 3 months (significant). 6 months ago, wt was up r/t fluid, but wts have been trending down gradually recently. He receives Prosource 2x/d. Will monitor for now, but consider need for additional supps if weight continues to trend down. Continue daily weights.  "  5. Mood disorder ([place]) 6. Anxiety disorder, unspecified type Significant anxiety with prognosis, and current status. Given concern for falls with benzodiazepines or additional anxiety meds, will continue to support patient's mood with environmental changes.   7. Anemia, unspecified type CBC collected today.   8. Do not resuscitate Patient maintains DNR status at this time.   9. Goals of care, counseling/discussion Outlined above in HPI and PD.   10. GERD Continue omeprazole 20 mg daily.   11. Allergic Rhinitis Continue flonase, zyrtec, and montelukast given patient severe symptoms.  Family/ staff Communication: Daughter and nursing updated.   Labs/tests ordered:  BMP, CBC  I spent greater than 1 hour discussing goals of care, current care plan, chart review, and documentation for the care of this patient.    Tomasa Rand, MD, [facility] [phone]

## 2022-04-24 ENCOUNTER — Encounter: Payer: Self-pay | Admitting: Student

## 2022-04-24 DIAGNOSIS — Z66 Do not resuscitate: Secondary | ICD-10-CM | POA: Insufficient documentation

## 2022-04-24 DIAGNOSIS — Z7189 Other specified counseling: Secondary | ICD-10-CM | POA: Insufficient documentation

## 2022-04-25 ENCOUNTER — Encounter: Payer: Self-pay | Admitting: Student

## 2022-05-08 DIAGNOSIS — B351 Tinea unguium: Secondary | ICD-10-CM | POA: Diagnosis not present

## 2022-05-08 DIAGNOSIS — I7091 Generalized atherosclerosis: Secondary | ICD-10-CM | POA: Diagnosis not present

## 2022-06-18 ENCOUNTER — Encounter: Payer: Self-pay | Admitting: Student

## 2022-06-20 ENCOUNTER — Encounter: Payer: Self-pay | Admitting: Student

## 2022-06-20 NOTE — Progress Notes (Signed)
Patient request for letter to insurance company so patient's daughter can orchestrate his affairs.

## 2022-06-29 ENCOUNTER — Non-Acute Institutional Stay (SKILLED_NURSING_FACILITY): Payer: Medicare Other | Admitting: Student

## 2022-06-29 ENCOUNTER — Encounter: Payer: Self-pay | Admitting: Student

## 2022-06-29 DIAGNOSIS — R2242 Localized swelling, mass and lump, left lower limb: Secondary | ICD-10-CM | POA: Diagnosis not present

## 2022-06-29 NOTE — Progress Notes (Unsigned)
Location:  Other [facility] Room Number: [facility] 516A Place of Service:  SNF [phone]) Provider:  Dr. Sherri Rad, MD  Patient Care Team: [name], MD as PCP - General [facility] Medicine)  Extended Emergency Contact Information Primary Emergency Contact: Emilie Rutter Address: [address]          [place], [place] [address] [name] of [place] Home Phone: [phone] Mobile Phone: [phone] Relation: Daughter  Code Status:  DNR Goals of care: Advanced Directive information    06/29/2022    1:08 PM  Advanced Directives  Does Patient Have a Medical Advance Directive? Yes  Type of Advance Directive Out of facility DNR (pink MOST or yellow form)  Does patient want to make changes to medical advance directive? No - Patient declined     Chief Complaint  Patient presents with   Acute Visit    Leg Swelling.     HPI:  Pt is a 89 y.o. male seen today for an acute visit for red, swollen lower extremity. Patient states it has been red, tender, and swollen for the last few days. He has had ultrasound to evaluate for DVT in the past. Denies fevers, chills, nausea/vomiting, etc.   Called patient's wife and HCPOA regarding findings. Discussed greatest concern is for DVT. Family would prefer to avoid the hospital if possible. Discussed plan to treat remotely if positive for DVT and if negative to start antibiotic therapy. Nursing in agreement with the plan.    Past Medical History:  Diagnosis Date   Asthma    Edema    MILD OF FEET   GERD (gastroesophageal reflux disease)    Hypertension    Mood disorder (HCC)    chronic anxiety   Past Surgical History:  Procedure Laterality Date   CATARACT EXTRACTION W/PHACO Left 02/16/2016   Procedure: CATARACT EXTRACTION PHACO AND INTRAOCULAR LENS PLACEMENT (IOC);  Surgeon: Nevada Crane, MD;  Location: [facility] ORS;  Service: Ophthalmology;  Laterality: Left;  [place] 1.10 AP% 10.6 CDE  7.41 Fluid Pack Lot # [id number]   CATARACT EXTRACTION W/PHACO Right 03/08/2016   Procedure: CATARACT EXTRACTION PHACO AND INTRAOCULAR LENS PLACEMENT (IOC);  Surgeon: Nevada Crane, MD;  Location: [facility] ORS;  Service: Ophthalmology;  Laterality: Right;  [place] 1.14 AP% 11.6 CDE 11.80 Fluid Pack Lot # [id number]   HERNIA REPAIR     HIP ARTHROPLASTY Left 07/06/2016   Procedure: ARTHROPLASTY BIPOLAR HIP (HEMIARTHROPLASTY);  Surgeon: Kennedy Bucker, MD;  Location: [facility] ORS;  Service: Orthopedics;  Laterality: Left;    No Known Allergies  Outpatient Encounter Medications as of 06/29/2022  Medication Sig   acetaminophen (TYLENOL) 325 MG tablet Take 650 mg by mouth every 6 (six) hours as needed for mild pain.   alum & mag hydroxide-simeth (MAALOX/MYLANTA) 200-200-20 MG/5ML suspension Take 30 mLs by mouth every 6 (six) hours as needed for heartburn.   Bismuth Subsalicylate (PEPTO-BISMOL PO) Take 30 mLs by mouth every 4 (four) hours as needed.   budesonide-formoterol (SYMBICORT) 160-4.5 MCG/ACT inhaler Inhale 2 puffs into the lungs 2 (two) times daily. Rinse mouth after use   carbidopa-levodopa (SINEMET CR) 50-200 MG tablet Take 1 tablet by mouth 3 (three) times daily before meals. 0730, 1130, 1630   cetirizine (ZYRTEC) 10 MG tablet Take 10 mg by mouth daily.   citalopram (CELEXA) 10 MG tablet Take 10 mg by mouth See admin instructions. Take one tablet (10 mg) once daily on Tuesday, Wednesday, Friday, Saturday and Sunday  Eyelid Cleansers (OCUSOFT EYELID CLEANSING) PADS Apply 1 application topically at bedtime. For blepharitis for 90 days   fluticasone (FLONASE) 50 MCG/ACT nasal spray Place 2 sprays into the nose daily.   Infant Care Products (DERMACLOUD) OINT Apply 1 Application topically 3 (three) times daily.   magnesium hydroxide (MILK OF MAGNESIA) 400 MG/5ML suspension Take 30 mLs by mouth daily as needed for mild constipation.   montelukast (SINGULAIR) 10 MG tablet Take 10 mg by mouth at bedtime.    omeprazole (PRILOSEC) 20 MG capsule Take 20 mg by mouth daily.   spironolactone (ALDACTONE) 50 MG tablet Take 50 mg by mouth daily.   tamsulosin (FLOMAX) 0.4 MG CAPS capsule Take 0.4 mg by mouth daily.    torsemide (DEMADEX) 20 MG tablet Take 40 mg by mouth daily.   [DISCONTINUED] melatonin 3 MG TABS tablet Take 3 mg by mouth at bedtime.   No facility-administered encounter medications on file as of 06/29/2022.    Review of Systems  Immunization History  Administered Date(s) Administered   Influenza Inj Mdck Quad Pf 03/16/2016   Influenza, High Dose Seasonal PF 03/09/2014, 03/08/2015, 04/03/2022   Influenza-Unspecified 03/08/2015   Moderna Covid-19 Vaccine Bivalent Booster 52yrs & up 11/14/2021   Moderna SARS-COV2 Booster Vaccination 11/04/2020   Moderna Sars-Covid-2 Vaccination 06/29/2019, 07/27/2019   Pfizer Covid-19 Vaccine Bivalent Booster 65yrs & up 03/10/2021   Tdap 03/09/2014   Pertinent  Health Maintenance Due  Topic Date Due   INFLUENZA VACCINE  Completed      05/31/2021    8:00 AM 05/31/2021    9:29 PM 06/01/2021   10:24 AM 09/28/2021    4:57 PM 09/29/2021    4:45 AM  Fall Risk  Patient Fall Risk Level High fall risk Moderate fall risk Moderate fall risk Moderate fall risk Low fall risk   Functional Status Survey:    Vitals:   06/29/22 1259  BP: 134/69  Pulse: 67  Resp: 16  Temp: (!) 97.3 F (36.3 C)  SpO2: 98%  Weight: 148 lb 4.8 oz (67.3 kg)  Height: 5\' 9"  (1.753 m)   Body mass index is 21.9 kg/m. Physical Exam Cardiovascular:     Rate and Rhythm: Normal rate.  Pulmonary:     Effort: Pulmonary effort is normal.     Breath sounds: Normal breath sounds.  Musculoskeletal:     Comments: Left lower extremity with 3+ edema and erythematous, tender to the touch, L>R  Neurological:     Mental Status: He is alert and oriented to person, place, and time.     Labs reviewed: Recent Labs    09/28/21 1650 09/29/21 0506 10/09/21 0000 10/16/21 0000  04/19/22 0000  NA 134* 136 137 134* 138  K 4.0 3.9 4.3 4.2 4.2  CL 98 103 100 97* 100  CO2 29 27 28* 26* 30*  GLUCOSE 111* 108*  --   --   --   BUN 28* 27* 29* 27* 32*  CREATININE 1.13 0.84 0.9 0.8 0.9  CALCIUM 8.6* 8.2* 9.4 9.2 9.2   Recent Labs    10/09/21 0000 10/16/21 0000 04/19/22 0000  AST  --  26 19  ALT  --  30 16  ALKPHOS 3.8* 118 94  ALBUMIN 4.1 4.2 4.1   Recent Labs    09/28/21 1650 09/29/21 0506 10/16/21 0000 04/19/22 0000  WBC 8.2 8.1 8.0 7.9  NEUTROABS 5.0  --   --  5,601.00  HGB 12.8* 13.1 14.2 15.0  HCT 38.5* 38.9* 42 43  MCV 89.7 90.0  --   --   PLT 259 218 338 319   Lab Results  Component Value Date   TSH 1.957 02/01/2020   Lab Results  Component Value Date   HGBA1C 6.0 (H) 05/31/2021   Lab Results  Component Value Date   CHOL 165 10/31/2019   HDL 46 10/31/2019   LDLCALC 111 (H) 10/31/2019   TRIG 40 10/31/2019   CHOLHDL 3.6 10/31/2019    Significant Diagnostic Results in last 30 days:  No results found.  Assessment/Plan 1. Localized swelling of left lower leg Patient with history of cellulitis in LLE. Given acuity, concern for DVT. Plan for evaluation tonight. If negative will start antibiotic therapy with keflex 500 TID for 7 days. If positive, will start Eliquis 5 mg BID. Patient and family would prefer to avoid hospital or ED visit if possible.    Family/ staff Communication: Manuela Schwartz, Nurse Marcie Bal  Labs/tests ordered:  D-dimer, CBC, BMP

## 2022-06-30 DIAGNOSIS — R2242 Localized swelling, mass and lump, left lower limb: Secondary | ICD-10-CM | POA: Insufficient documentation

## 2022-06-30 LAB — BASIC METABOLIC PANEL
BUN: 36 — AB (ref 4–21)
CO2: 24 — AB (ref 13–22)
Chloride: 99 (ref 99–108)
Creatinine: 1 (ref 0.6–1.3)
Glucose: 116
Potassium: 4.6 mEq/L (ref 3.5–5.1)
Sodium: 135 — AB (ref 137–147)

## 2022-06-30 LAB — CBC AND DIFFERENTIAL
HCT: 41 (ref 41–53)
Hemoglobin: 14.1 (ref 13.5–17.5)
Platelets: 309 10*3/uL (ref 150–400)
WBC: 9.2

## 2022-06-30 LAB — COMPREHENSIVE METABOLIC PANEL
Calcium: 8.6 — AB (ref 8.7–10.7)
eGFR: 70

## 2022-06-30 LAB — CBC: RBC: 4.55 (ref 3.87–5.11)

## 2022-07-01 ENCOUNTER — Encounter: Payer: Self-pay | Admitting: Student

## 2022-07-02 ENCOUNTER — Encounter: Payer: Self-pay | Admitting: Student

## 2022-07-02 ENCOUNTER — Non-Acute Institutional Stay (SKILLED_NURSING_FACILITY): Payer: Medicare Other | Admitting: Student

## 2022-07-02 DIAGNOSIS — R2242 Localized swelling, mass and lump, left lower limb: Secondary | ICD-10-CM

## 2022-07-02 DIAGNOSIS — I872 Venous insufficiency (chronic) (peripheral): Secondary | ICD-10-CM | POA: Diagnosis not present

## 2022-07-02 NOTE — Telephone Encounter (Signed)
Skilled resident at [facility]  

## 2022-07-02 NOTE — Progress Notes (Signed)
Location:  Other [facility].  Nursing Home Room Number: [facility] [address]A Place of Service:  SNF [phone]) Provider:  Dr. [name]   Patient Care Team: [name], MD as PCP - General [facility] Medicine)  Extended Emergency Contact Information Primary Emergency Contact: Emilie Rutter Address: [address]          [place], [place] [address] [name] of [place] Home Phone: [phone] Mobile Phone: [phone] Relation: Daughter  Code Status:  DNR Goals of care: Advanced Directive information    07/02/2022   11:08 AM  Advanced Directives  Does Patient Have a Medical Advance Directive? Yes  Type of Advance Directive Out of facility DNR (pink MOST or yellow form)  Does patient want to make changes to medical advance directive? No - Patient declined     Chief Complaint  Patient presents with   Acute Visit    Wound Check.     HPI:  Pt is a 89 y.o. male seen today for an acute visit for follow up of left lower extremity tenderness, redness, and swelling. Patient is alert and states he has been anxious/nervous because he isn't sure it is getting better.   Messaged his daughter regarding updates.    Past Medical History:  Diagnosis Date   Asthma    Edema    MILD OF FEET   GERD (gastroesophageal reflux disease)    Hypertension    Mood disorder (HCC)    chronic anxiety   Past Surgical History:  Procedure Laterality Date   CATARACT EXTRACTION W/PHACO Left 02/16/2016   Procedure: CATARACT EXTRACTION PHACO AND INTRAOCULAR LENS PLACEMENT (IOC);  Surgeon: Nevada Crane, MD;  Location: [facility] ORS;  Service: Ophthalmology;  Laterality: Left;  [place] 1.10 AP% 10.6 CDE 7.41 Fluid Pack Lot # [id number]   CATARACT EXTRACTION W/PHACO Right 03/08/2016   Procedure: CATARACT EXTRACTION PHACO AND INTRAOCULAR LENS PLACEMENT (IOC);  Surgeon: Nevada Crane, MD;  Location: [facility] ORS;  Service: Ophthalmology;  Laterality: Right;  [place] 1.14 AP% 11.6 CDE 11.80 Fluid Pack Lot #  [id number]   HERNIA REPAIR     HIP ARTHROPLASTY Left 07/06/2016   Procedure: ARTHROPLASTY BIPOLAR HIP (HEMIARTHROPLASTY);  Surgeon: Kennedy Bucker, MD;  Location: [facility] ORS;  Service: Orthopedics;  Laterality: Left;    No Known Allergies  Outpatient Encounter Medications as of 07/02/2022  Medication Sig   acetaminophen (TYLENOL) 325 MG tablet Take 650 mg by mouth every 6 (six) hours as needed for mild pain.   alum & mag hydroxide-simeth (MAALOX/MYLANTA) 200-200-20 MG/5ML suspension Take 30 mLs by mouth every 6 (six) hours as needed for heartburn.   Bismuth Subsalicylate (PEPTO-BISMOL PO) Take 30 mLs by mouth every 4 (four) hours as needed.   budesonide-formoterol (SYMBICORT) 160-4.5 MCG/ACT inhaler Inhale 2 puffs into the lungs 2 (two) times daily. Rinse mouth after use   carbidopa-levodopa (SINEMET CR) 50-200 MG tablet Take 1 tablet by mouth 3 (three) times daily before meals. 0730, 1130, 1630   cetirizine (ZYRTEC) 10 MG tablet Take 10 mg by mouth daily.   citalopram (CELEXA) 10 MG tablet Take 10 mg by mouth See admin instructions. Take one tablet (10 mg) once daily on Tuesday, Wednesday, Friday, Saturday and Sunday   doxycycline (VIBRAMYCIN) 100 MG capsule Take 100 mg by mouth 2 (two) times daily.   Eyelid Cleansers (OCUSOFT EYELID CLEANSING) PADS Apply 1 application topically at bedtime. For blepharitis for 90 days   fluticasone (FLONASE) 50 MCG/ACT nasal spray Place 2 sprays into the nose daily.  Infant Care Products (DERMACLOUD) OINT Apply 1 Application topically 3 (three) times daily.   magnesium hydroxide (MILK OF MAGNESIA) 400 MG/5ML suspension Take 30 mLs by mouth daily as needed for mild constipation.   montelukast (SINGULAIR) 10 MG tablet Take 10 mg by mouth at bedtime.   omeprazole (PRILOSEC) 20 MG capsule Take 20 mg by mouth daily.   Protein (PROSOURCE PO) Twice daily for wound healing.   spironolactone (ALDACTONE) 50 MG tablet Take 50 mg by mouth daily.   tamsulosin (FLOMAX) 0.4 MG  CAPS capsule Take 0.4 mg by mouth daily.    torsemide (DEMADEX) 20 MG tablet Take 40 mg by mouth daily.   zinc oxide 20 % ointment Apply 1 Application topically 2 (two) times daily as needed for irritation.   No facility-administered encounter medications on file as of 07/02/2022.    Review of Systems  All other systems reviewed and are negative.   Immunization History  Administered Date(s) Administered   Influenza Inj Mdck Quad Pf 03/16/2016   Influenza, High Dose Seasonal PF 03/09/2014, 03/08/2015, 04/03/2022   Influenza-Unspecified 03/08/2015   Moderna Covid-19 Vaccine Bivalent Booster 9yrs & up 11/14/2021   Moderna SARS-COV2 Booster Vaccination 11/04/2020   Moderna Sars-Covid-2 Vaccination 06/29/2019, 07/27/2019, 04/29/2020, 11/04/2020   Pfizer Covid-19 Vaccine Bivalent Booster 43yrs & up 03/10/2021, 11/14/2021, 04/27/2022   Tdap 03/09/2014   Pertinent  Health Maintenance Due  Topic Date Due   INFLUENZA VACCINE  Completed      05/31/2021    8:00 AM 05/31/2021    9:29 PM 06/01/2021   10:24 AM 09/28/2021    4:57 PM 09/29/2021    4:45 AM  Fall Risk  (RETIRED) Patient Fall Risk Level High fall risk Moderate fall risk Moderate fall risk Moderate fall risk Low fall risk   Functional Status Survey:    Vitals:   07/02/22 1057  BP: 123/66  Pulse: 72  Resp: 18  Temp: 98.4 F (36.9 C)  SpO2: 93%  Weight: 158 lb 3.2 oz (71.8 kg)  Height: 5\' 9"  (1.753 m)   Body mass index is 23.36 kg/m. Physical Exam Vitals reviewed.  Skin:    Comments: LLE w/ erythema 2+ edema, nontender to palpation, no increased warmth  Neurological:     General: No focal deficit present.     Mental Status: He is alert and oriented to person, place, and time.     Labs reviewed: Recent Labs    09/28/21 1650 09/29/21 0506 10/09/21 0000 10/16/21 0000 04/19/22 0000  NA 134* 136 137 134* 138  K 4.0 3.9 4.3 4.2 4.2  CL 98 103 100 97* 100  CO2 29 27 28* 26* 30*  GLUCOSE 111* 108*  --   --    --   BUN 28* 27* 29* 27* 32*  CREATININE 1.13 0.84 0.9 0.8 0.9  CALCIUM 8.6* 8.2* 9.4 9.2 9.2   Recent Labs    10/09/21 0000 10/16/21 0000 04/19/22 0000  AST  --  26 19  ALT  --  30 16  ALKPHOS 3.8* 118 94  ALBUMIN 4.1 4.2 4.1   Recent Labs    09/28/21 1650 09/29/21 0506 10/16/21 0000 04/19/22 0000  WBC 8.2 8.1 8.0 7.9  NEUTROABS 5.0  --   --  5,601.00  HGB 12.8* 13.1 14.2 15.0  HCT 38.5* 38.9* 42 43  MCV 89.7 90.0  --   --   PLT 259 218 338 319   Lab Results  Component Value Date   TSH 1.957 02/01/2020  Lab Results  Component Value Date   HGBA1C 6.0 (H) 05/31/2021   Lab Results  Component Value Date   CHOL 165 10/31/2019   HDL 46 10/31/2019   LDLCALC 111 (H) 10/31/2019   TRIG 40 10/31/2019   CHOLHDL 3.6 10/31/2019    Significant Diagnostic Results in last 30 days:  No results found.  Assessment/Plan Venous insufficiency of both lower extremities  Localized swelling of left lower leg Patient with improvement of LLE swelling and erythema. Decreased tenderness from previous exam. Patient was started on doxycycline 100 mg BID. BMP unremarkable, D-dimer, slightly elevated 0.65 when upper limit of normal is 0.5. WBC and [place] pending at this time. Continue doxycycline given improvement of swelling. S/p lasix x1 and has lost 2.5 lbs since last evaluation. Encourage compression and elevation as tolerated. Will consider redosing lasix 40 mg if continued swelling.    Family/ staff Communication: nursing, daughter  Labs/tests ordered:  pending CBC and venous doppler

## 2022-07-05 ENCOUNTER — Encounter: Payer: Self-pay | Admitting: Nurse Practitioner

## 2022-07-05 ENCOUNTER — Non-Acute Institutional Stay (SKILLED_NURSING_FACILITY): Payer: Medicare Other | Admitting: Nurse Practitioner

## 2022-07-05 DIAGNOSIS — L209 Atopic dermatitis, unspecified: Secondary | ICD-10-CM

## 2022-07-05 LAB — COMPREHENSIVE METABOLIC PANEL
Calcium: 8.6 — AB (ref 8.7–10.7)
eGFR: 70

## 2022-07-05 LAB — BASIC METABOLIC PANEL
BUN: 36 — AB (ref 4–21)
CO2: 24 — AB (ref 13–22)
Chloride: 99 (ref 99–108)
Creatinine: 1 (ref 0.6–1.3)
Glucose: 116
Potassium: 4.6 mEq/L (ref 3.5–5.1)
Sodium: 135 — AB (ref 137–147)

## 2022-07-05 LAB — CBC: RBC: 4.55 (ref 3.87–5.11)

## 2022-07-05 LAB — CBC AND DIFFERENTIAL
HCT: 41 (ref 41–53)
Hemoglobin: 14.1 (ref 13.5–17.5)
Platelets: 309 10*3/uL (ref 150–400)
WBC: 9.2

## 2022-07-05 NOTE — Progress Notes (Signed)
Location:  Other [facility]) Nursing Home Room Number: 623 A Place of Service:  SNF (31)  Keshauna Degraffenreid K. Dewaine Oats, NP   Patient Care Team: Dewayne Shorter, MD as PCP - General [facility] Medicine)  Extended Emergency Contact Information Primary Emergency Contact: Doran Heater Address: [address]          [place], [place] [address] Johnnette Litter of [place] Phone: [phone] Mobile Phone: [phone] Relation: Daughter  Goals of care: Advanced Directive information    07/05/2022   11:40 AM  Advanced Directives  Does Patient Have a Medical Advance Directive? Yes  Type of Advance Directive Out of facility DNR (pink MOST or yellow form)  Does patient want to make changes to medical advance directive? No - Patient declined  Pre-existing out of facility DNR order (yellow form or pink MOST form) Pink MOST form placed in chart (order not valid for inpatient use)     Chief Complaint  Patient presents with   Medical Management of Chronic Issues    Routine visit. Discuss need for shingrix, AWV, pneumonia vaccines, and additional covid boosters or post pone if patient refuses.     HPI:  Pt is a 89 y.o. male seen today for rash on left thigh.  Pt reports for about a week he has had an itchy rash to left thigh. He has been applying hydrocortisone cream which is helping the itching but rash persist.    Past Medical History:  Diagnosis Date   Asthma    Edema    MILD OF FEET   GERD (gastroesophageal reflux disease)    Hypertension    Mood disorder (HCC)    chronic anxiety   Past Surgical History:  Procedure Laterality Date   CATARACT EXTRACTION W/PHACO Left 02/16/2016   Procedure: CATARACT EXTRACTION PHACO AND INTRAOCULAR LENS PLACEMENT ([place]);  Surgeon: Eulogio Bear, MD;  Location: [facility] ORS;  Service: Ophthalmology;  Laterality: Left;  [place] 1.10 AP% 10.6 CDE 7.41 Fluid Pack Lot # [id number]   CATARACT EXTRACTION W/PHACO Right 03/08/2016   Procedure: CATARACT EXTRACTION  PHACO AND INTRAOCULAR LENS PLACEMENT ([place]);  Surgeon: Eulogio Bear, MD;  Location: [facility] ORS;  Service: Ophthalmology;  Laterality: Right;  [place] 1.14 AP% 11.6 CDE 11.80 Fluid Pack Lot # [id number]   HERNIA REPAIR     HIP ARTHROPLASTY Left 07/06/2016   Procedure: ARTHROPLASTY BIPOLAR HIP (HEMIARTHROPLASTY);  Surgeon: Hessie Knows, MD;  Location: [facility] ORS;  Service: Orthopedics;  Laterality: Left;    No Known Allergies  Outpatient Encounter Medications as of 07/05/2022  Medication Sig   acetaminophen (TYLENOL) 325 MG tablet Take 650 mg by mouth every 6 (six) hours as needed for mild pain.   alum & mag hydroxide-simeth (MAALOX/MYLANTA) 200-200-20 MG/5ML suspension Take 30 mLs by mouth every 6 (six) hours as needed for heartburn.   Bismuth Subsalicylate (PEPTO-BISMOL PO) Take 30 mLs by mouth every 4 (four) hours as needed.   budesonide-formoterol (SYMBICORT) 160-4.5 MCG/ACT inhaler Inhale 2 puffs into the lungs 2 (two) times daily. Rinse mouth after use   carbidopa-levodopa (SINEMET CR) 50-200 MG tablet Take 1 tablet by mouth 3 (three) times daily before meals. 0730, 1130, 1630   cetirizine (ZYRTEC) 10 MG tablet Take 10 mg by mouth daily.   citalopram (CELEXA) 10 MG tablet Take 10 mg by mouth See admin instructions. Take one tablet (10 mg) once daily on Tuesday, Wednesday, Friday, Saturday and Sunday   doxycycline (VIBRAMYCIN) 100 MG capsule Take 100 mg by mouth 2 (two) times daily.  Eyelid Cleansers (OCUSOFT EYELID CLEANSING) PADS Apply 1 application topically at bedtime. For blepharitis for 90 days   fluticasone (FLONASE) 50 MCG/ACT nasal spray Place 2 sprays into the nose daily.   Infant Care Products (DERMACLOUD) OINT Apply 1 Application topically 3 (three) times daily.   magnesium hydroxide (MILK OF MAGNESIA) 400 MG/5ML suspension Take 30 mLs by mouth daily as needed for mild constipation.   montelukast (SINGULAIR) 10 MG tablet Take 10 mg by mouth at bedtime.   omeprazole (PRILOSEC) 20 MG  capsule Take 20 mg by mouth daily.   Protein (PROSOURCE PO) Twice daily for wound healing.   spironolactone (ALDACTONE) 50 MG tablet Take 50 mg by mouth daily.   tamsulosin (FLOMAX) 0.4 MG CAPS capsule Take 0.4 mg by mouth daily.    torsemide (DEMADEX) 20 MG tablet Take 40 mg by mouth daily.   ZINC OXIDE EX Apply 1 Application topically every 12 (twelve) hours as needed.   [DISCONTINUED] zinc oxide 20 % ointment Apply 1 Application topically 2 (two) times daily as needed for irritation.   No facility-administered encounter medications on file as of 07/05/2022.    Review of Systems  Skin:  Positive for rash.     Immunization History  Administered Date(s) Administered   Influenza Inj Mdck Quad Pf 03/16/2016   Influenza, High Dose Seasonal PF 03/09/2014, 03/08/2015, 04/03/2022   Influenza-Unspecified 03/08/2015   Moderna Covid-19 Vaccine Bivalent Booster 57yrs & up 11/14/2021   Moderna SARS-COV2 Booster Vaccination 11/04/2020   Moderna Sars-Covid-2 Vaccination 06/29/2019, 07/27/2019, 04/29/2020, 11/04/2020   Pfizer Covid-19 Vaccine Bivalent Booster 68yrs & up 03/10/2021, 11/14/2021, 04/27/2022   Tdap 03/09/2014   Pertinent  Health Maintenance Due  Topic Date Due   INFLUENZA VACCINE  Completed      05/31/2021    8:00 AM 05/31/2021    9:29 PM 06/01/2021   10:24 AM 09/28/2021    4:57 PM 09/29/2021    4:45 AM  Fall Risk  (RETIRED) Patient Fall Risk Level High fall risk Moderate fall risk Moderate fall risk Moderate fall risk Low fall risk   Functional Status Survey:    Vitals:   07/05/22 1138  BP: 121/61  Pulse: 73  Temp: 97.6 F (36.4 C)  Weight: 150 lb (68 kg)  Height: 5\' 9"  (1.753 m)   Body mass index is 22.15 kg/m. Physical Exam Constitutional:      Appearance: Normal appearance.  Skin:    Findings: Rash present.     Comments: Raised erythremic rash with excoriation noted to left thigh  Neurological:     Mental Status: He is alert. Mental status is at baseline.   Psychiatric:        Mood and Affect: Mood normal.     Labs reviewed: Recent Labs    09/28/21 1650 09/29/21 0506 10/09/21 0000 10/16/21 0000 04/19/22 0000  NA 134* 136 137 134* 138  K 4.0 3.9 4.3 4.2 4.2  CL 98 103 100 97* 100  CO2 29 27 28* 26* 30*  GLUCOSE 111* 108*  --   --   --   BUN 28* 27* 29* 27* 32*  CREATININE 1.13 0.84 0.9 0.8 0.9  CALCIUM 8.6* 8.2* 9.4 9.2 9.2   Recent Labs    10/09/21 0000 10/16/21 0000 04/19/22 0000  AST  --  26 19  ALT  --  30 16  ALKPHOS 3.8* 118 94  ALBUMIN 4.1 4.2 4.1   Recent Labs    09/28/21 1650 09/29/21 0506 10/16/21 0000 04/19/22 0000  WBC 8.2 8.1 8.0 7.9  NEUTROABS 5.0  --   --  5,601.00  HGB 12.8* 13.1 14.2 15.0  HCT 38.5* 38.9* 42 43  MCV 89.7 90.0  --   --   PLT 259 218 338 319   Lab Results  Component Value Date   TSH 1.957 02/01/2020   Lab Results  Component Value Date   HGBA1C 6.0 (H) 05/31/2021   Lab Results  Component Value Date   CHOL 165 10/31/2019   HDL 46 10/31/2019   LDLCALC 111 (H) 10/31/2019   TRIG 40 10/31/2019   CHOLHDL 3.6 10/31/2019    Significant Diagnostic Results in last 30 days:  No results found.  Assessment/Plan 1. Atopic dermatitis, unspecified type Will have staff apply triamcinolone 0.1 % cream BID with petroleum jelly until resolves. To notify for signs of infection or worsening of rash.   Janene Harvey. Biagio Borg [facility] [phone]

## 2022-07-09 ENCOUNTER — Encounter: Payer: Self-pay | Admitting: Student

## 2022-07-10 ENCOUNTER — Non-Acute Institutional Stay (SKILLED_NURSING_FACILITY): Payer: Medicare Other | Admitting: Adult Health

## 2022-07-10 ENCOUNTER — Encounter: Payer: Self-pay | Admitting: Adult Health

## 2022-07-10 DIAGNOSIS — I872 Venous insufficiency (chronic) (peripheral): Secondary | ICD-10-CM | POA: Diagnosis not present

## 2022-07-10 DIAGNOSIS — L03116 Cellulitis of left lower limb: Secondary | ICD-10-CM

## 2022-07-10 DIAGNOSIS — R2242 Localized swelling, mass and lump, left lower limb: Secondary | ICD-10-CM | POA: Diagnosis not present

## 2022-07-10 MED ORDER — SACCHAROMYCES BOULARDII 250 MG PO CAPS: 250.0000 mg | ORAL_CAPSULE | Freq: Two times a day (BID) | ORAL | 0 refills | Status: AC

## 2022-07-10 MED ORDER — SACCHAROMYCES BOULARDII 250 MG PO CAPS: 250.0000 mg | ORAL_CAPSULE | Freq: Two times a day (BID) | ORAL | 0 refills | Status: DC

## 2022-07-10 MED ORDER — CEFADROXIL 500 MG PO CAPS: 500.0000 mg | ORAL_CAPSULE | Freq: Two times a day (BID) | ORAL | 0 refills | Status: AC

## 2022-07-10 NOTE — Progress Notes (Signed)
Location:   [facility] Room Number: [id number] Place of Service:  SNF ([phone]) Provider:  Durenda Age, DNP  Dewayne Shorter, MD  Patient Care Team: Dewayne Shorter, MD as PCP - General (Family Medicine)  Extended Emergency Contact Information Primary Emergency Contact: Doran Heater Address: [address]          [place],  [address] Johnnette Litter of [place] Phone: [phone] Mobile Phone: [phone] Relation: Daughter  Code Status:  DNR Goals of care: Advanced Directive information    07/10/2022   10:24 AM  Advanced Directives  Does Patient Have a Medical Advance Directive? Yes  Type of Advance Directive Out of facility DNR (pink MOST or yellow form)  Does patient want to make changes to medical advance directive? No - Patient declined  Pre-existing out of facility DNR order (yellow form or pink MOST form) Pink MOST form placed in chart (order not valid for inpatient use)     Chief Complaint  Patient presents with   Acute Visit    Left leg edema and redness    HPI:  Pt is a 89 y.o. male seen today for an acute visit for left leg edema and redness. He recently completed a 7-day course of Doxycycline for left leg cellulitis. He was seen after he had walked around the facility with walker. Left leg is erythematous and has 2+edema. Skin is warm to touch. No reported fever nor chills. He has a PMH of asthma, hypertension, Parkinson's disease and arthritis.   Past Medical History:  Diagnosis Date   Asthma    Edema    MILD OF FEET   GERD (gastroesophageal reflux disease)    Hypertension    Mood disorder (HCC)    chronic anxiety   Past Surgical History:  Procedure Laterality Date   CATARACT EXTRACTION W/PHACO Left 02/16/2016   Procedure: CATARACT EXTRACTION PHACO AND INTRAOCULAR LENS PLACEMENT ([place]);  Surgeon: Eulogio Bear, MD;  Location: [facility] ORS;  Service: Ophthalmology;  Laterality: Left;  [place] 1.10 AP%  10.6 CDE 7.41 Fluid Pack Lot # [id number]   CATARACT EXTRACTION W/PHACO Right 03/08/2016   Procedure: CATARACT EXTRACTION PHACO AND INTRAOCULAR LENS PLACEMENT ([place]);  Surgeon: Eulogio Bear, MD;  Location: [facility] ORS;  Service: Ophthalmology;  Laterality: Right;  [place] 1.14 AP% 11.6 CDE 11.80 Fluid Pack Lot # [id number]   HERNIA REPAIR     HIP ARTHROPLASTY Left 07/06/2016   Procedure: ARTHROPLASTY BIPOLAR HIP (HEMIARTHROPLASTY);  Surgeon: Hessie Knows, MD;  Location: [facility] ORS;  Service: Orthopedics;  Laterality: Left;    No Known Allergies  Allergies as of 07/10/2022   No Known Allergies      Medication List        Accurate as of July 10, 2022 10:38 AM. If you have any questions, ask your nurse or doctor.          STOP taking these medications    doxycycline 100 MG capsule Commonly known as: VIBRAMYCIN Stopped by: Durenda Age, NP   magnesium hydroxide 400 MG/5ML suspension Commonly known as: MILK OF MAGNESIA Stopped by: Durenda Age, NP       TAKE these medications    acetaminophen 325 MG tablet Commonly known as: TYLENOL Take 650 mg by mouth every 6 (six) hours as needed for mild pain.   alum & mag hydroxide-simeth 200-200-20 MG/5ML suspension Commonly known as: MAALOX/MYLANTA Take 30 mLs by mouth every 6 (six) hours as needed for indigestion or heartburn.   budesonide-formoterol 160-4.5 MCG/ACT  inhaler Commonly known as: SYMBICORT Inhale 2 puffs into the lungs 2 (two) times daily. Rinse mouth after use   carbidopa-levodopa 50-200 MG tablet Commonly known as: SINEMET CR Take 1 tablet by mouth 3 (three) times daily before meals. 0730, 1130, 1630   cetirizine 10 MG tablet Commonly known as: ZYRTEC Take 10 mg by mouth daily.   citalopram 10 MG tablet Commonly known as: CELEXA Take 10 mg by mouth See admin instructions. Take one tablet (10 mg) once daily on Tuesday, Wednesday, Friday, Saturday and Sunday   D[place]acl[place] Oint Apply 1  Application topically 3 (three) times daily.   fluticasone 50 MCG/ACT nasal spray Commonly known as: FLONASE Place 2 sprays into the nose daily.   furosemide 40 MG tablet Commonly known as: LASIX Take 40 mg by mouth daily.   montelukast 10 MG tablet Commonly known as: SINGULAIR Take 10 mg by mouth at bedtime.   OcuSoft Eyelid Cleansing Pads Apply 1 application topically at bedtime. For blepharitis for 90 days   omeprazole 20 MG capsule Commonly known as: PRILOSEC Take 20 mg by mouth daily.   PEPTO-BISMOL PO Take 30 mLs by mouth every 4 (four) hours as needed.   PROSOURCE PO Twice daily for wound healing.   spironolactone 50 MG tablet Commonly known as: ALDACTONE Take 50 mg by mouth daily.   tamsulosin 0.4 MG Caps capsule Commonly known as: FLOMAX Take 0.4 mg by mouth daily.   torsemide 20 MG tablet Commonly known as: DEMADEX Take 40 mg by mouth daily.   triamcinolone cream 0.1 % Commonly known as: KENALOG Apply 1 Application topically 2 (two) times daily. Apply to right thigh, L thigh, Abd topically two times a day for rash morning and evening. apply with petroleum jelly   ZINC OXIDE EX Apply 1 Application topically every 12 (twelve) hours as needed.        Review of Systems  Constitutional:  Negative for activity change, appetite change and fever.  HENT:  Negative for sore throat.   Eyes: Negative.   Cardiovascular:  Positive for leg swelling. Negative for chest pain.  Gastrointestinal:  Negative for abdominal distention, diarrhea and vomiting.  Genitourinary:  Negative for dysuria, frequency and urgency.  Skin:  Positive for rash. Negative for color change.  Neurological:  Negative for dizziness and headaches.  Psychiatric/Behavioral:  Negative for behavioral problems and sleep disturbance. The patient is not nervous/anxious.     Immunization History  Administered Date(s) Administered   Influenza Inj Mdck Quad Pf 03/16/2016   Influenza, High Dose  Seasonal PF 03/09/2014, 03/08/2015, 04/03/2022   Influenza-Unspecified 03/08/2015   Moderna Covid-19 Vaccine Bivalent Booster 18yrs & up 11/14/2021   Moderna SARS-COV2 Booster Vaccination 11/04/2020   Moderna Sars-Covid-2 Vaccination 06/29/2019, 07/27/2019, 04/29/2020   Pfizer Covid-19 Vaccine Bivalent Booster 12yrs & up 03/10/2021, 04/27/2022   Tdap 03/09/2014   Pertinent  Health Maintenance Due  Topic Date Due   INFLUENZA VACCINE  Completed      12 /14/2022    8:00 AM 05/31/2021    9:29 PM 06/01/2021   10:24 AM 09/28/2021    4:57 PM 09/29/2021    4:45 AM  Fall Risk  (RETIRED) Patient Fall Risk Level High fall risk Moderate fall risk Moderate fall risk Moderate fall risk Low fall risk   Functional Status Survey:    Vitals:   07/10/22 1022  BP: 114/86  Pulse: 68  Resp: 20  Temp: 98.4 F (36.9 C)  SpO2: 93%  Weight: 149 lb 3.2 oz (67.7  kg)  Height: 5\' 9"  (1.753 m)   Body mass index is 22.03 kg/m. Physical Exam Constitutional:      General: He is not in acute distress.    Appearance: Normal appearance.  HENT:     Head: Normocephalic and atraumatic.     Mouth/Throat:     Mouth: Mucous membranes are moist.  Eyes:     Conjunctiva/sclera: Conjunctivae normal.  Cardiovascular:     Rate and Rhythm: Normal rate and regular rhythm.     Pulses: Normal pulses.     Heart sounds: Normal heart sounds.  Pulmonary:     Effort: Pulmonary effort is normal.     Breath sounds: Normal breath sounds.  Abdominal:     General: Bowel sounds are normal.     Palpations: Abdomen is soft.  Musculoskeletal:        General: No swelling. Normal range of motion.     Cervical back: Normal range of motion.     Right lower leg: No edema.     Left lower leg: Edema present.     Comments: LLE 2+edema  Skin:    General: Skin is warm and dry.     Findings: Erythema present.  Neurological:     Mental Status: He is alert and oriented to person, place, and time. Mental status is at baseline.      Comments: drools  Psychiatric:        Mood and Affect: Mood normal.        Behavior: Behavior normal.        Thought Content: Thought content normal.        Judgment: Judgment normal.     Labs reviewed: Recent Labs    09/28/21 1650 09/29/21 0506 10/09/21 0000 10/16/21 0000 04/19/22 0000  NA 134* 136 137 134* 138  K 4.0 3.9 4.3 4.2 4.2  CL 98 103 100 97* 100  CO2 29 27 28* 26* 30*  GLUCOSE 111* 108*  --   --   --   BUN 28* 27* 29* 27* 32*  CREATININE 1.13 0.84 0.9 0.8 0.9  CALCIUM 8.6* 8.2* 9.4 9.2 9.2   Recent Labs    10/09/21 0000 10/16/21 0000 04/19/22 0000  AST  --  26 19  ALT  --  30 16  ALKPHOS 3.8* 118 94  ALBUMIN 4.1 4.2 4.1   Recent Labs    09/28/21 1650 09/29/21 0506 10/16/21 0000 04/19/22 0000  WBC 8.2 8.1 8.0 7.9  NEUTROABS 5.0  --   --  5,601.00  HGB 12.8* 13.1 14.2 15.0  HCT 38.5* 38.9* 42 43  MCV 89.7 90.0  --   --   PLT 259 218 338 319   Lab Results  Component Value Date   TSH 1.957 02/01/2020   Lab Results  Component Value Date   HGBA1C 6.0 (H) 05/31/2021   Lab Results  Component Value Date   CHOL 165 10/31/2019   HDL 46 10/31/2019   LDLCALC 111 (H) 10/31/2019   TRIG 40 10/31/2019   CHOLHDL 3.6 10/31/2019    Significant Diagnostic Results in last 30 days:  No results found.  Assessment/Plan  1. Cellulitis of left lower leg -  left lower leg is erythematous and warm to touch -  will start on Cefadroxil X 5 days and Florastor (probiotic) X 8 days - cefadroxil (DURICEF) 500 MG capsule; Take 1 capsule (500 mg total) by mouth 2 (two) times daily for 5 days.  Dispense: 10 capsule; Refill: 0  2. Localized swelling of left lower leg -  continue Spironolactone and Torsemide -  continue compression socks and elevate legs   3. Venous insufficiency of both lower extremities -  causing the edema -  continue Torsemide and Spironolactone    Family/ staff Communication:   Discussed plan of care with resident and charge  nurse.  Labs/tests ordered:   None

## 2022-08-05 ENCOUNTER — Other Ambulatory Visit: Payer: Self-pay

## 2022-08-05 ENCOUNTER — Encounter: Payer: Self-pay | Admitting: Internal Medicine

## 2022-08-05 ENCOUNTER — Inpatient Hospital Stay
Admission: EM | Admit: 2022-08-05 | Discharge: 2022-08-07 | DRG: 177 | Disposition: A | Discharge status: Skilled Nursing Facility | Payer: Medicare Other | Source: Skilled Nursing Facility | Attending: Internal Medicine | Admitting: Internal Medicine

## 2022-08-05 ENCOUNTER — Emergency Department: Payer: Medicare Other

## 2022-08-05 DIAGNOSIS — Z96642 Presence of left artificial hip joint: Secondary | ICD-10-CM | POA: Diagnosis present

## 2022-08-05 DIAGNOSIS — N138 Other obstructive and reflux uropathy: Secondary | ICD-10-CM | POA: Diagnosis present

## 2022-08-05 DIAGNOSIS — Z7951 Long term (current) use of inhaled steroids: Secondary | ICD-10-CM

## 2022-08-05 DIAGNOSIS — F419 Anxiety disorder, unspecified: Secondary | ICD-10-CM | POA: Diagnosis present

## 2022-08-05 DIAGNOSIS — G20B2 Parkinson's disease with dyskinesia, with fluctuations: Secondary | ICD-10-CM | POA: Diagnosis present

## 2022-08-05 DIAGNOSIS — Z66 Do not resuscitate: Secondary | ICD-10-CM | POA: Diagnosis present

## 2022-08-05 DIAGNOSIS — R0902 Hypoxemia: Secondary | ICD-10-CM | POA: Diagnosis present

## 2022-08-05 DIAGNOSIS — N401 Enlarged prostate with lower urinary tract symptoms: Secondary | ICD-10-CM | POA: Diagnosis present

## 2022-08-05 DIAGNOSIS — R7303 Prediabetes: Secondary | ICD-10-CM | POA: Diagnosis present

## 2022-08-05 DIAGNOSIS — I878 Other specified disorders of veins: Secondary | ICD-10-CM | POA: Diagnosis present

## 2022-08-05 DIAGNOSIS — R1312 Dysphagia, oropharyngeal phase: Secondary | ICD-10-CM | POA: Diagnosis present

## 2022-08-05 DIAGNOSIS — E876 Hypokalemia: Secondary | ICD-10-CM | POA: Diagnosis present

## 2022-08-05 DIAGNOSIS — G20B1 Parkinson's disease with dyskinesia, without mention of fluctuations: Secondary | ICD-10-CM | POA: Diagnosis not present

## 2022-08-05 DIAGNOSIS — J45909 Unspecified asthma, uncomplicated: Secondary | ICD-10-CM | POA: Diagnosis present

## 2022-08-05 DIAGNOSIS — T17908A Unspecified foreign body in respiratory tract, part unspecified causing other injury, initial encounter: Secondary | ICD-10-CM

## 2022-08-05 DIAGNOSIS — J690 Pneumonitis due to inhalation of food and vomit: Secondary | ICD-10-CM | POA: Diagnosis present

## 2022-08-05 DIAGNOSIS — G20A2 Parkinson's disease without dyskinesia, with fluctuations: Secondary | ICD-10-CM | POA: Diagnosis not present

## 2022-08-05 DIAGNOSIS — Z1152 Encounter for screening for COVID-19: Secondary | ICD-10-CM | POA: Diagnosis not present

## 2022-08-05 DIAGNOSIS — J9601 Acute respiratory failure with hypoxia: Secondary | ICD-10-CM | POA: Diagnosis present

## 2022-08-05 DIAGNOSIS — J4530 Mild persistent asthma, uncomplicated: Secondary | ICD-10-CM

## 2022-08-05 DIAGNOSIS — G20A1 Parkinson's disease without dyskinesia, without mention of fluctuations: Secondary | ICD-10-CM | POA: Diagnosis present

## 2022-08-05 DIAGNOSIS — Z7189 Other specified counseling: Secondary | ICD-10-CM | POA: Diagnosis not present

## 2022-08-05 DIAGNOSIS — K219 Gastro-esophageal reflux disease without esophagitis: Secondary | ICD-10-CM | POA: Diagnosis present

## 2022-08-05 DIAGNOSIS — D72829 Elevated white blood cell count, unspecified: Secondary | ICD-10-CM | POA: Diagnosis present

## 2022-08-05 DIAGNOSIS — Z825 Family history of asthma and other chronic lower respiratory diseases: Secondary | ICD-10-CM | POA: Diagnosis not present

## 2022-08-05 DIAGNOSIS — I10 Essential (primary) hypertension: Secondary | ICD-10-CM | POA: Diagnosis present

## 2022-08-05 DIAGNOSIS — Z515 Encounter for palliative care: Secondary | ICD-10-CM | POA: Diagnosis not present

## 2022-08-05 DIAGNOSIS — Z79899 Other long term (current) drug therapy: Secondary | ICD-10-CM | POA: Diagnosis not present

## 2022-08-05 LAB — CBC WITH DIFFERENTIAL/PLATELET
Abs Immature Granulocytes: 0.06 10*3/uL (ref 0.00–0.07)
Basophils Absolute: 0 10*3/uL (ref 0.0–0.1)
Basophils Relative: 0 %
Eosinophils Absolute: 0.1 10*3/uL (ref 0.0–0.5)
Eosinophils Relative: 1 %
HCT: 43.5 % (ref 39.0–52.0)
Hemoglobin: 14.3 g/dL (ref 13.0–17.0)
Immature Granulocytes: 1 %
Lymphocytes Relative: 18 %
Lymphs Abs: 2.4 10*3/uL (ref 0.7–4.0)
MCH: 29.9 pg (ref 26.0–34.0)
MCHC: 32.9 g/dL (ref 30.0–36.0)
MCV: 90.8 fL (ref 80.0–100.0)
Monocytes Absolute: 1 10*3/uL (ref 0.1–1.0)
Monocytes Relative: 7 %
Neutro Abs: 9.8 10*3/uL — ABNORMAL HIGH (ref 1.7–7.7)
Neutrophils Relative %: 73 %
Platelets: 315 10*3/uL (ref 150–400)
RBC: 4.79 MIL/uL (ref 4.22–5.81)
RDW: 13.7 % (ref 11.5–15.5)
WBC: 13.3 10*3/uL — ABNORMAL HIGH (ref 4.0–10.5)
nRBC: 0 % (ref 0.0–0.2)

## 2022-08-05 LAB — LACTIC ACID, PLASMA
Lactic Acid, Venous: 1.4 mmol/L (ref 0.5–1.9)
Lactic Acid, Venous: 1.6 mmol/L (ref 0.5–1.9)

## 2022-08-05 LAB — RESP PANEL BY RT-PCR (RSV, FLU A&B, COVID)  RVPGX2
Influenza A by PCR: NEGATIVE
Influenza B by PCR: NEGATIVE
Resp Syncytial Virus by PCR: NEGATIVE
SARS Coronavirus 2 by RT PCR: NEGATIVE

## 2022-08-05 LAB — COMPREHENSIVE METABOLIC PANEL WITH GFR
ALT: 7 U/L (ref 0–44)
AST: 23 U/L (ref 15–41)
Albumin: 4.1 g/dL (ref 3.5–5.0)
Alkaline Phosphatase: 105 U/L (ref 38–126)
Anion gap: 11 (ref 5–15)
BUN: 47 mg/dL — ABNORMAL HIGH (ref 8–23)
CO2: 27 mmol/L (ref 22–32)
Calcium: 8.7 mg/dL — ABNORMAL LOW (ref 8.9–10.3)
Chloride: 97 mmol/L — ABNORMAL LOW (ref 98–111)
Creatinine, Ser: 1.2 mg/dL (ref 0.61–1.24)
GFR, Estimated: 57 mL/min — ABNORMAL LOW
Glucose, Bld: 163 mg/dL — ABNORMAL HIGH (ref 70–99)
Potassium: 3.7 mmol/L (ref 3.5–5.1)
Sodium: 135 mmol/L (ref 135–145)
Total Bilirubin: 0.7 mg/dL (ref 0.3–1.2)
Total Protein: 7.8 g/dL (ref 6.5–8.1)

## 2022-08-05 LAB — TROPONIN I (HIGH SENSITIVITY)
Troponin I (High Sensitivity): 11 ng/L
Troponin I (High Sensitivity): 16 ng/L (ref <18)

## 2022-08-05 LAB — BRAIN NATRIURETIC PEPTIDE: B Natriuretic Peptide: 57.7 pg/mL (ref 0.0–100.0)

## 2022-08-05 MED ORDER — PANTOPRAZOLE SODIUM 40 MG PO TBEC
40.0000 mg | DELAYED_RELEASE_TABLET | Freq: Every day | ORAL | Status: DC
  Administered 2022-08-07: 40 mg via ORAL
  Filled 2022-08-05 (×2): qty 1

## 2022-08-05 MED ORDER — HYDRALAZINE HCL 20 MG/ML IJ SOLN: 10.0000 mg | Freq: Four times a day (QID) | INTRAMUSCULAR | Status: DC | PRN

## 2022-08-05 MED ORDER — SODIUM CHLORIDE 0.9 % IV SOLN
1.0000 g | Freq: Three times a day (TID) | INTRAVENOUS | Status: DC
  Administered 2022-08-06: 1 g via INTRAVENOUS
  Filled 2022-08-05 (×2): qty 20

## 2022-08-05 MED ORDER — MOMETASONE FURO-FORMOTEROL FUM 200-5 MCG/ACT IN AERO
2.0000 | INHALATION_SPRAY | Freq: Two times a day (BID) | RESPIRATORY_TRACT | Status: DC
  Administered 2022-08-06 – 2022-08-07 (×3): 2 via RESPIRATORY_TRACT
  Filled 2022-08-05: qty 8.8

## 2022-08-05 MED ORDER — CARBIDOPA-LEVODOPA ER 50-200 MG PO TBCR
1.0000 | EXTENDED_RELEASE_TABLET | Freq: Three times a day (TID) | ORAL | Status: DC
  Administered 2022-08-06 – 2022-08-07 (×5): 1 via ORAL
  Filled 2022-08-05 (×6): qty 1

## 2022-08-05 MED ORDER — SODIUM CHLORIDE 0.9 % IV SOLN
1.0000 g | Freq: Once | INTRAVENOUS | Status: AC
  Administered 2022-08-05: 1 g via INTRAVENOUS
  Filled 2022-08-05: qty 10

## 2022-08-05 MED ORDER — SPIRONOLACTONE 25 MG PO TABS
50.0000 mg | ORAL_TABLET | Freq: Every day | ORAL | Status: DC
  Administered 2022-08-07: 50 mg via ORAL
  Filled 2022-08-05 (×2): qty 2

## 2022-08-05 MED ORDER — FLUTICASONE PROPIONATE 50 MCG/ACT NA SUSP
2.0000 | Freq: Every day | NASAL | Status: DC
  Administered 2022-08-07: 2 via NASAL
  Filled 2022-08-05: qty 16

## 2022-08-05 MED ORDER — MONTELUKAST SODIUM 10 MG PO TABS
10.0000 mg | ORAL_TABLET | Freq: Every day | ORAL | Status: DC
  Administered 2022-08-06: 10 mg via ORAL
  Filled 2022-08-05: qty 1

## 2022-08-05 MED ORDER — TAMSULOSIN HCL 0.4 MG PO CAPS
0.4000 mg | ORAL_CAPSULE | Freq: Every day | ORAL | Status: DC
  Filled 2022-08-05: qty 1

## 2022-08-05 MED ORDER — CITALOPRAM HYDROBROMIDE 20 MG PO TABS
10.0000 mg | ORAL_TABLET | ORAL | Status: DC
  Administered 2022-08-07: 10 mg via ORAL
  Filled 2022-08-05: qty 1

## 2022-08-05 MED ORDER — TORSEMIDE 20 MG PO TABS
40.0000 mg | ORAL_TABLET | Freq: Every day | ORAL | Status: DC
  Administered 2022-08-07: 40 mg via ORAL
  Filled 2022-08-05 (×2): qty 2

## 2022-08-05 MED ORDER — ACETAMINOPHEN 650 MG RE SUPP: 650.0000 mg | Freq: Four times a day (QID) | RECTAL | Status: DC | PRN

## 2022-08-05 MED ORDER — ACETAMINOPHEN 325 MG PO TABS: 650.0000 mg | ORAL_TABLET | Freq: Four times a day (QID) | ORAL | Status: DC | PRN

## 2022-08-05 MED ORDER — SODIUM CHLORIDE 0.9 % IV SOLN
500.0000 mg | Freq: Once | INTRAVENOUS | Status: AC
  Administered 2022-08-05: 500 mg via INTRAVENOUS
  Filled 2022-08-05: qty 5

## 2022-08-05 MED ORDER — ENOXAPARIN SODIUM 40 MG/0.4ML IJ SOSY
40.0000 mg | PREFILLED_SYRINGE | INTRAMUSCULAR | Status: DC
  Administered 2022-08-06: 40 mg via SUBCUTANEOUS
  Filled 2022-08-05: qty 0.4

## 2022-08-05 NOTE — ED Provider Notes (Addendum)
[facility] Provider Note    Event Date/Time   First MD Initiated Contact with Patient 08/05/22 2029     (approximate)  History   Chief Complaint: Shortness of Breath  HPI  Brandon Calhoun is a 89 y.o. male with a past medical history of asthma, CHF, gastric reflux, hypertension presents emergency department for shortness of breath.  According to EMS patient is coming from his [place] for worsening shortness of breath.  Per EMS staff reports recent cough and congestion but today patient was very short of breath found to be satting in the 70s per EMS placed on CPAP and brought to the emergency department.  No reported baseline O2 requirement.  Here the patient has diffuse rhonchi in all lung fields.  Patient not able to provide any additional history but shook his head no when asked if he is in any pain.  Per EMS daughter who is the power of attorney is on her way and does not want any significant measures or workup performed until she arrives and can speak with myself.  Physical Exam   Triage Vital Signs: ED Triage Vitals  Enc Vitals Group     BP 08/05/22 2021 (!) 140/69     Pulse Rate 08/05/22 2021 93     Resp 08/05/22 2021 (!) 24     Temp 08/05/22 2021 99 F (37.2 C)     Temp Source 08/05/22 2021 Axillary     SpO2 08/05/22 2021 97 %     Weight 08/05/22 2023 167 lb (75.8 kg)     Height 08/05/22 2023 5' 9"$  (1.753 m)     Head Circumference --      Peak Flow --      Pain Score 08/05/22 2023 0     Pain Loc --      Pain Edu? --      Excl. in [place]? --     Most recent vital signs: Vitals:   08/05/22 2021  BP: (!) 140/69  Pulse: 93  Resp: (!) 24  Temp: 99 F (37.2 C)  SpO2: 97%    General: Patient is awake sitting up in bed audible rhonchi.  On CPAP. CV:  Good peripheral perfusion.  Regular rate and rhythm  Resp:  Mild tachypnea with diffuse rhonchi on exam. Abd:  No distention.  Soft, nontender.  No rebound or  guarding.   ED Results / Procedures / Treatments   EKG  EKG viewed and interpreted by myself shows a sinus rhythm at 94 bpm with a widened QRS, normal axis, PR prolongation nonspecific ST changes no ST elevation.  RADIOLOGY  I have reviewed and interpreted chest x-ray images.  No obvious consolidation seen on my evaluation. Radiology is read the x-ray as negative.   MEDICATIONS ORDERED IN ED: Medications - No data to display   IMPRESSION / MDM / [place] / ED COURSE  I reviewed the triage vital signs and the nursing notes.  Patient's presentation is most consistent with acute presentation with potential threat to life or bodily function.  Patient presents emergency department for shortness of breath found to be hypoxic in the 70s diffuse rhonchi transition from CPAP to BiPAP upon arrival we will obtain a chest x-ray.  Daughter is now here who is very reasonable, states she does not want intubation does not want resuscitation but besides that is open to all medical care including pressors if needed, antibiotics, fluids, etc.  We will check  labs, cardiac enzyme, BNP, COVID/flu/RSV we will continue to closely monitor while awaiting results.  Patient is satting in the mid 90s on BiPAP appears very comfortable in no distress.  Patient's labs have resulted largely reassuring with a negative COVID/flu/RSV, lactic acid 1.6, negative troponin, overall reassuring chemistry with reassuring renal function.  Patient's CBC does show leukocytosis of 13,300.  We have been able to transition the patient from BiPAP back to 6 L nasal cannula.  Daughter is here highly suspect an aspiration event given the history of aspiration events in the past.  Patient has been receiving Rocephin and Zithromax to cover for pneumonia given temperature of 99 with leukocytosis and hypoxia on arrival.  We will broaden coverage with meropenem to cover for possible aspiration.  Patient admitted to the hospital  service for further workup and treatment.  CRITICAL CARE Performed by: Harvest Dark   Total critical care time: 30 minutes  Critical care time was exclusive of separately billable procedures and treating other patients.  Critical care was necessary to treat or prevent imminent or life-threatening deterioration.  Critical care was time spent personally by me on the following activities: development of treatment plan with patient and/or surrogate as well as nursing, discussions with consultants, evaluation of patient's response to treatment, examination of patient, obtaining history from patient or surrogate, ordering and performing treatments and interventions, ordering and review of laboratory studies, ordering and review of radiographic studies, pulse oximetry and re-evaluation of patient's condition.   FINAL CLINICAL IMPRESSION(S) / ED DIAGNOSES   Dyspnea Hypoxia Aspiration   Note:  This document was prepared using Dragon voice recognition software and may include unintentional dictation errors.   Harvest Dark, MD 08/05/22 2324    Harvest Dark, MD 08/05/22 2325

## 2022-08-05 NOTE — H&P (Signed)
History and Physical    Patient: Brandon Calhoun DOB: 1929/09/16 DOA: 08/05/2022 DOS: the patient was seen and examined on 08/05/2022 PCP: Dewayne Shorter, MD  Patient coming from: SNF  Chief Complaint:  Chief Complaint  Patient presents with   Shortness of Breath   HPI: Brandon Calhoun is a 89 y.o. male with medical history significant of hypertension, prediabetes (hga1c=6.0), parkinsons, gerd, asthma, apparently presents with c/o hypoxia.  , pox 80's, and rhonchi, requiring bipap in ED to maintain o2 sat >90%,  pt apparently very rhonchorous per ED, and thought to have aspirated.  Pt daughter thinks that applesauce might have been the culprit.  Pt has slight cough, nonproductive, and dyspnea.  Pt denies fever, chills, cp, palp, abd pain, diarrhea, brbpr, black stool, dysuria, hematuria.  Pt has not had a prior formal speech evaluation in the past.  Pt apparently at baseline walks with walker about 1/2 mile per day, and is able to feed himself. No special dietary restrictions currently.   In ED, T 99, P 93  Bp 140/69  R 24, pox 70's per ED on RA, 97% on Bipap at FIo2 30%,   Pt was slowly weaned off bipap to 6L o2 [place] while in the ED.   Wbc 13.3, hgb 14.3, Plt 315 Na 135, K 3.7, Bun 47, Creat 1.2, Ast 23, Alt 7 Lactic acid 1.4, BNP 57.7   Trop 11-> 16  Respiratory panel negative Blood culture x2 pending  CXR  FINDINGS: Normal mediastinum and cardiac silhouette. Normal pulmonary vasculature. No evidence of effusion, infiltrate, or pneumothorax. No acute bony abnormality. 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   IMPRESSION: No acute cardiopulmonary process.  Pt given meropenem, Rocephin, zithromax in ED   Pt will be admitted for acute respiratory failure with hypoxia secondary to aspiration.    Review of Systems: negative for all organ systems except for + above    Past Medical History:  Diagnosis Date   Asthma    Edema    MILD OF FEET    GERD (gastroesophageal reflux disease)    Hypertension    Mood disorder (HCC)    chronic anxiety   Past Surgical History:  Procedure Laterality Date   CATARACT EXTRACTION W/PHACO Left 02/16/2016   Procedure: CATARACT EXTRACTION PHACO AND INTRAOCULAR LENS PLACEMENT ([place]);  Surgeon: Eulogio Bear, MD;  Location: [facility] ORS;  Service: Ophthalmology;  Laterality: Left;  [place] 1.10 AP% 10.6 CDE 7.41 Fluid Pack Lot # [id number]   CATARACT EXTRACTION W/PHACO Right 03/08/2016   Procedure: CATARACT EXTRACTION PHACO AND INTRAOCULAR LENS PLACEMENT ([place]);  Surgeon: Eulogio Bear, MD;  Location: [facility] ORS;  Service: Ophthalmology;  Laterality: Right;  [place] 1.14 AP% 11.6 CDE 11.80 Fluid Pack Lot # [id number]   HERNIA REPAIR     HIP ARTHROPLASTY Left 07/06/2016   Procedure: ARTHROPLASTY BIPOLAR HIP (HEMIARTHROPLASTY);  Surgeon: Hessie Knows, MD;  Location: [facility] ORS;  Service: Orthopedics;  Laterality: Left;   Social History:  reports that he has never smoked. He has never used smokeless tobacco. He reports that he does not drink alcohol and does not use drugs.  No Known Allergies  Family History  Problem Relation Age of Onset   COPD Brother    Hypertension Neg Hx    Diabetes Neg Hx    Heart disease Neg Hx     Prior to Admission medications   Medication Sig Start Date End Date Taking? Authorizing Provider  acetaminophen (TYLENOL) 325 MG tablet Take 650  mg by mouth every 6 (six) hours as needed for mild pain.    [provider]  alum & mag hydroxide-simeth (MAALOX/MYLANTA) 200-200-20 MG/5ML suspension Take 30 mLs by mouth every 6 (six) hours as needed for indigestion or heartburn.    [provider]  Bismuth Subsalicylate (PEPTO-BISMOL PO) Take 30 mLs by mouth every 4 (four) hours as needed.    [provider]  budesonide-formoterol (SYMBICORT) 160-4.5 MCG/ACT inhaler Inhale 2 puffs into the lungs 2 (two) times daily. Rinse mouth after use    [provider]   carbidopa-levodopa (SINEMET CR) 50-200 MG tablet Take 1 tablet by mouth 3 (three) times daily before meals. 0730, 1130, 1630    [provider]  cetirizine (ZYRTEC) 10 MG tablet Take 10 mg by mouth daily.    [provider]  citalopram (CELEXA) 10 MG tablet Take 10 mg by mouth See admin instructions. Take one tablet (10 mg) once daily on Tuesday, Wednesday, Friday, Saturday and Sunday    [provider]  Eyelid Cleansers (OCUSOFT EYELID CLEANSING) PADS Apply 1 application topically at bedtime. For blepharitis for 90 days    [provider]  fluticasone (FLONASE) 50 MCG/ACT nasal spray Place 2 sprays into the nose daily.    [provider]  furosemide (LASIX) 40 MG tablet Take 40 mg by mouth daily.    [provider]  Infant Care Products [facility]) OINT Apply 1 Application topically 3 (three) times daily.    [provider]  montelukast (SINGULAIR) 10 MG tablet Take 10 mg by mouth at bedtime.    [provider]  omeprazole (PRILOSEC) 20 MG capsule Take 20 mg by mouth daily.    [provider]  Protein (PROSOURCE PO) Twice daily for wound healing.    [provider]  spironolactone (ALDACTONE) 50 MG tablet Take 50 mg by mouth daily. 02/09/22   [provider]  tamsulosin (FLOMAX) 0.4 MG CAPS capsule Take 0.4 mg by mouth daily.     [provider]  torsemide (DEMADEX) 20 MG tablet Take 40 mg by mouth daily. 04/28/21   [provider]  triamcinolone cream (KENALOG) 0.1 % Apply 1 Application topically 2 (two) times daily. Apply to right thigh, L thigh, Abd topically two times a day for rash morning and evening. apply with petroleum jelly    [provider]  ZINC OXIDE EX Apply 1 Application topically every 12 (twelve) hours as needed.    [provider]    Physical Exam: Vitals:   08/05/22 2021 08/05/22 2023 08/05/22 2130  BP: (!) 140/69  134/74  Pulse: 93  81   Resp: (!) 24  20  Temp: 99 F (37.2 C)  99 F (37.2 C)  TempSrc: Axillary    SpO2: 97%  100%  Weight:  75.8 kg   Height:  5' 9"$  (1.753 m)    Heent: anicteric, pupils 1.82m symmetric, direct, consensual , near intact Neck: no jvd Heart: rrr s1, s2, no m/g/r Lung: + rhonci right lung field, lots of upper respiratory noise Abd: soft, nt, nd, +bs Ext: no c/c/e Skin: no rash Lymph:  no cervical adenopathy Neuro: nonfocal   Data Reviewed:     Assessment and Plan: Acute Respiratory Failure with hypoxia  ?aspiration Aspiration precautions Urine legionella antigen Urine strep antigen Mrsa screen Cont  Meropenem iv NPO Speech therapy consult  Parkinsons Cont sinemet PT to evaluate  Asthma Cont Symbicort-> Dulera Cont Singulair 112mpo qhs  GeKellogg  PPI  Edema Cont Torsemide Cont Aldactone  Bph Cont Flomax  Anxiety Cont Celexa  DVT prophylaxis: Lovenox Code status: DNR/DNI Dispo: SNF  Pt will require > 2 nites stay for evaluation of acute respiratory failure with hypoxia, secondary to aspiration.         Advance Care Planning:   Code Status: Prior DNR/DNI, confirmed by yellow ticket as well as daughter  Consults: none  Family Communication: w daughter  Severity of Illness: The appropriate patient status for this patient is INPATIENT. Inpatient status is judged to be reasonable and necessary in order to provide the required intensity of service to ensure the patient's safety. The patient's presenting symptoms, physical exam findings, and initial radiographic and laboratory data in the context of their chronic comorbidities is felt to place them at high risk for further clinical deterioration. Furthermore, it is not anticipated that the patient will be medically stable for discharge from the hospital within 2 midnights of admission.   * I certify that at the point of admission it is my clinical judgment that the patient will require inpatient hospital  care spanning beyond 2 midnights from the point of admission due to high intensity of service, high risk for further deterioration and high frequency of surveillance required.*  Author: Jani Gravel, MD 08/05/2022 10:44 PM  For on call review [email or web address].

## 2022-08-05 NOTE — ED Triage Notes (Signed)
PT BIBA w c/o of SOB. Congested and coughing. On CPAP 100% on arrival. Baseline room air.

## 2022-08-06 ENCOUNTER — Inpatient Hospital Stay: Payer: Medicare Other

## 2022-08-06 ENCOUNTER — Telehealth: Payer: Self-pay | Admitting: Student

## 2022-08-06 DIAGNOSIS — J9601 Acute respiratory failure with hypoxia: Secondary | ICD-10-CM | POA: Diagnosis not present

## 2022-08-06 LAB — CBC
HCT: 39 % (ref 39.0–52.0)
Hemoglobin: 13.1 g/dL (ref 13.0–17.0)
MCH: 30 pg (ref 26.0–34.0)
MCHC: 33.6 g/dL (ref 30.0–36.0)
MCV: 89.2 fL (ref 80.0–100.0)
Platelets: 258 10*3/uL (ref 150–400)
RBC: 4.37 MIL/uL (ref 4.22–5.81)
RDW: 13.6 % (ref 11.5–15.5)
WBC: 12.3 10*3/uL — ABNORMAL HIGH (ref 4.0–10.5)
nRBC: 0 % (ref 0.0–0.2)

## 2022-08-06 LAB — COMPREHENSIVE METABOLIC PANEL
ALT: 7 U/L (ref 0–44)
AST: 18 U/L (ref 15–41)
Albumin: 3.5 g/dL (ref 3.5–5.0)
Alkaline Phosphatase: 91 U/L (ref 38–126)
Anion gap: 11 (ref 5–15)
BUN: 41 mg/dL — ABNORMAL HIGH (ref 8–23)
CO2: 24 mmol/L (ref 22–32)
Calcium: 8.4 mg/dL — ABNORMAL LOW (ref 8.9–10.3)
Chloride: 102 mmol/L (ref 98–111)
Creatinine, Ser: 0.9 mg/dL (ref 0.61–1.24)
GFR, Estimated: >60 mL/min (ref >60)
Glucose, Bld: 118 mg/dL — ABNORMAL HIGH (ref 70–99)
Potassium: 3.4 mmol/L — ABNORMAL LOW (ref 3.5–5.1)
Sodium: 137 mmol/L (ref 135–145)
Total Bilirubin: 1 mg/dL (ref 0.3–1.2)
Total Protein: 6.8 g/dL (ref 6.5–8.1)

## 2022-08-06 LAB — MRSA NEXT GEN BY PCR, NASAL: MRSA by PCR Next Gen: NOT DETECTED

## 2022-08-06 MED ORDER — POTASSIUM CHLORIDE 20 MEQ PO PACK: 40.0000 meq | PACK | Freq: Once | ORAL | Status: DC

## 2022-08-06 MED ORDER — TAMSULOSIN HCL 0.4 MG PO CAPS: 0.4000 mg | ORAL_CAPSULE | Freq: Every day | ORAL | Status: DC

## 2022-08-06 MED ORDER — SODIUM CHLORIDE 0.9 % IV SOLN
3.0000 g | Freq: Four times a day (QID) | INTRAVENOUS | Status: DC
  Administered 2022-08-06 – 2022-08-07 (×5): 3 g via INTRAVENOUS
  Filled 2022-08-06: qty 8
  Filled 2022-08-06 (×2): qty 3
  Filled 2022-08-06 (×2): qty 8
  Filled 2022-08-06: qty 3
  Filled 2022-08-06: qty 8

## 2022-08-06 MED ORDER — SODIUM CHLORIDE 0.9 % IV SOLN
1.0000 g | Freq: Three times a day (TID) | INTRAVENOUS | Status: DC
  Filled 2022-08-06 (×2): qty 20

## 2022-08-06 MED ORDER — POTASSIUM CHLORIDE CRYS ER 20 MEQ PO TBCR
40.0000 meq | EXTENDED_RELEASE_TABLET | Freq: Once | ORAL | Status: DC
  Administered 2022-08-06: 40 meq via ORAL
  Filled 2022-08-06: qty 2

## 2022-08-06 NOTE — Progress Notes (Signed)
Nutrition Brief Note  Chart reviewed. Pt underwent BSE and MBSS; pt to remain NPO as a safe oral diet cannot be recommended. Palliative care consult pending for GOC. Pt does not desire an NGT for PEG. No further nutrition interventions planned at this time.  Please re-consult as needed.   Loistine Chance, RD, LDN, [place] Registered Dietitian II Certified Diabetes Care and Education Specialist Please refer to [facility] for RD and/or RD on-call/weekend/after hours pager

## 2022-08-06 NOTE — Progress Notes (Signed)
Pharmacy Antibiotic Note  Brandon Calhoun is a 89 y.o. male admitted on 08/05/2022 with  aspiration pneumonia .  Pharmacy has been consulted for Unasyn dosing.  Plan: Unasyn (ampicillin/sulbactam) 3 grams every 6 hours  Height: 5' 9"$  (175.3 cm) Weight: 65 kg (143 lb 4.8 oz) IBW/kg (Calculated) : 70.7  Temp (24hrs), Avg:98.2 F (36.8 C), Min:97.5 F (36.4 C), Max:99 F (37.2 C)  Recent Labs  Lab 08/05/22 2020 08/05/22 2246 08/06/22 0434  WBC 13.3*  --  12.3*  CREATININE 1.20  --  0.90  LATICACIDVEN 1.4 1.6  --     Estimated Creatinine Clearance: 50.2 mL/min (by C-G formula based on SCr of 0.9 mg/dL).    Allergies  Allergen Reactions   Doxycycline     Antimicrobials this admission: Meropenem 2/18 >> 2/18 Azithromycin 2/18 >> 2/18 CRO 2/18  >> 2/18 Unasyn 2/19 >>  Dose adjustments this admission: N/a  Microbiology results: 2/18 BCx: NG < 12 hours 2/19 MRSA PCR: negative  Thank you for allowing pharmacy to be a part of this patient's care.  Glean Salvo, PharmD, BCPS Clinical Pharmacist  08/06/2022 3:15 PM

## 2022-08-06 NOTE — IPAL (Signed)
  Interdisciplinary Goals of Care Family Meeting   Date carried out: 08/06/2022  Location of the meeting: Bedside  Member's involved: {IDGC Participants:[id number]::"Physician","Bedside Registered Nurse","Social Worker","Family Member or next of kin"}  Durable Power of Attorney or acting medical decision maker: Brandon Calhoun  Discussion: We discussed goals of care for Brandon Calhoun .    Code status:   Code Status: DNR   Disposition: Home with Hospice  Time spent for the meeting: 35 minutes  Discussed with patient and Brandon Calhoun at the bedside and she understands that Patient has severe profound oropharyngeal dysphagia and alternative means of feeding has been recommended. Patient does not want a PEG tube and they have opted for comfort measures with pleasure feeds as tolerated. Will request palliative care consult with plans for discharge to [facility] with hospice   [name], MD  08/06/2022, 2:52 PM

## 2022-08-06 NOTE — Procedures (Addendum)
Modified Barium Swallow Study  Patient Details  Name: Brandon Calhoun MRN: [id number] Date of Birth: 1929-12-11  Today's Date: 08/06/2022  HPI/PMH: HPI: Per H&P, pt is a 89 y.o. male with medical history significant of hypertension, prediabetes (hga1c=6.0), parkinsons, gerd, asthma, apparently presents with c/o hypoxia.  , pox 80's, and rhonchi, requiring bipap in ED to maintain o2 sat >90%,  pt apparently very rhonchorous per ED, and thought to have aspirated.  Pt daughter thinks that applesauce might have been the culprit.  Pt has slight cough, nonproductive, and dyspnea.  Pt denies fever, chills, cp, palp, abd pain, diarrhea, brbpr, black stool, dysuria, hematuria.  Pt has not had a prior formal speech evaluation in the past.  Pt apparently at baseline walks with walker about 1/2 mile per day, and is able to feed himself. No special dietary restrictions currently.   Clinical Impression: Clinical Impression: Pt presents with a severe-profound oropharyngeal dysphagia. Suspect pt's dysphagia is chronic in the setting of Parkinson's Disease and advanced age.   Oral swallow is notable for oral residual which is increased with viscosity as well as repetitive/disorganized lingual motions during A-P transit. Pharyngeal swallow is notable for moderate-severe pharyngeal stasis which is increased with viscosity, before the swallow silent aspiration of thin liquids, during the swallow silent aspiration of thin liquids, during the swallow laryngeal penetration with nectar-thick liquids and pureed, and after swallow silent aspiration of honey thick liquids. After the swallow aspiration was resultant of pharyngeal stasis. Suspect pt is at risk for post-swallow aspiration of pharyngeal stasis regardless of consistency. Cued secondary swallows were limited in effectiveness in reducing pharyngeal stasis. Cued cough did not clear penetration or aspiration. Above mention deficits attributed to: decreased lingual  strength and coordination, reduced base of tongue retraction, reduced hyolaryngeal elevation/excursion, reduced/mistimed laryngeal vestibule closure, reduced pharyngeal stripping wave, reduced duration of UES opening, and reduced laryngeal and pharyngeal sensation.   At present, a safe oral diet cannot be recommended. Recommend NPO with Palliative Care Consult to discuss [place].   Pt's daughter and pt educated at length immediately following MBSS re: results of MBSS, rationale for current recommendations, changes to swallow function in pt's with Parkinson's Disease, risks associated with aspiration/aspiration PNA, and SLP POC. Pt and daughter shown videofluoroscopic images for education. Pt and daughter asked pertinent questions and verbalized satisfaction with answers provided by SLP. Pt's daughter noted that pt not agreeable to NG or PEG tube. MD made aware. Pt's daughter requesting dysphagia ex's to help maintain any preserved swallow function. SLP to f/u per POC for dysphagia tx.   RN and MD made aware of results, recommendations, and SLP POC.  Factors that may increase risk of adverse event in presence of aspiration ([place] 2021): Factors that may increase risk of adverse event in presence of aspiration ([place] 2021): Frail or deconditioned; Aspiration of thick, dense, and/or acidic materials; Frequent aspiration of large volumes; Limited mobility; Poor general health and/or compromised immunity; Respiratory or GI disease   Recommendations/Plan: Swallowing Evaluation Recommendations Swallowing Evaluation Recommendations Recommendations: NPO Medication Administration: Via alternative means Oral care recommendations: Oral care QID (4x/day); Staff/trained caregiver to provide oral care Recommended consults: Consider Palliative care Caregiver Recommendations: Remove water pitcher    Treatment Plan Treatment Plan Treatment recommendations: Therapy as outlined in treatment  plan below Follow-up recommendations: Skilled nursing-short term rehab (<3 hours/day) Functional status assessment: Patient has had a recent decline in their functional status and demonstrates the ability to make significant improvements in function in  a reasonable and predictable amount of time. Treatment frequency: Min 2x/week Treatment duration: 1 week (trial of therapy while pt in house) Interventions: Oropharyngeal exercises; Patient/family education     Recommendations Recommendations for follow up therapy are one component of a multi-disciplinary discharge planning process, led by the attending physician.  Recommendations may be updated based on patient status, additional functional criteria and insurance authorization.  Assessment: Orofacial Exam: Orofacial Exam Oral Cavity: Oral Hygiene: WFL Oral Cavity - Dentition: Adequate natural dentition Orofacial Anatomy: WFL Oral Motor/Sensory Function: Generalized oral weakness    Anatomy:  Anatomy: WFL (lordosis)   Thin Liquids: Thin Liquids (Level 0) Thin Liquids : Impaired Bolus delivery method: Spoon; Cup Thin Liquid - Impairment: Oral Impairment; Pharyngeal impairment Lip Closure: Interlabial escape, no progression to anterior lip Tongue control during bolus hold: Posterior escape of greater than half of bolus Bolus transport/lingual motion: Repetitive/disorganized tongue motion Oral residue: Residue collection on oral structures Location of oral residue : Tongue; Floor of mouth Initiation of swallow : Pyriform sinuses Soft palate elevation: No bolus between soft palate (SP)/pharyngeal wall (PW) Anterior hyoid excursion: Partial Epiglottic movement: Partial Laryngeal vestibule closure: Incomplete, narrow column air/contrast in laryngeal vestibule Pharyngeal stripping wave : Present - diminished Pharyngeal contraction (A/P view only): N/A Pharyngoesophageal segment opening: Partial distention/partial duration, partial  obstruction of flow Tongue base retraction: Wide column of contrast or air between tongue base and PPW Pharyngeal residue: Collection of residue within or on pharyngeal structures Location of pharyngeal residue: Diffuse (>3 areas) Penetration/Aspiration Scale (PAS) score: 8.  Material enters airway, passes BELOW cords without attempt by patient to eject out (silent aspiration)     Mildly Thick Liquids: Mildly thick liquids (Level 2, nectar thick) Mildly thick liquids (Level 2, nectar thick): Impaired Bolus delivery method: Spoon; Cup Mildly Thick Liquid - Impairment: Oral Impairment; Pharyngeal impairment Lip Closure: Interlabial escape, no progression to anterior lip Tongue control during bolus hold: Not tested Bolus transport/lingual motion: Repetitive/disorganized tongue motion Oral residue: Residue collection on oral structures Location of oral residue : Floor of mouth; Tongue; Palate; Lateral sulci Initiation of swallow : Pyriform sinuses Soft palate elevation: No bolus between soft palate (SP)/pharyngeal wall (PW) Laryngeal elevation: Partial superior movement of thyroid cartilage/partial approximation of arytenoids to epiglottic petiole Anterior hyoid excursion: Partial Epiglottic movement: Complete Laryngeal vestibule closure: Incomplete, narrow column air/contrast in laryngeal vestibule Pharyngeal stripping wave : Present - diminished Pharyngeal contraction (A/P view only): N/A Pharyngoesophageal segment opening: Partial distention/partial duration, partial obstruction of flow Tongue base retraction: Wide column of contrast or air between tongue base and PPW Pharyngeal residue: Collection of residue within or on pharyngeal structures Location of pharyngeal residue: Diffuse (>3 areas) Penetration/Aspiration Scale (PAS) score: 5.  Material enters airway, CONTACTS cords and not ejected out (during and after the swallow)     Moderately Thick Liquids: Moderately thick liquids  (Level 3, honey thick) Moderately thick liquids (Level 3, honey thick): Impaired Bolus delivery method: Spoon Moderately Thick Liquid - Impairment: Oral Impairment; Pharyngeal impairment Lip Closure: Interlabial escape, no progression to anterior lip Tongue control during bolus hold: Not tested Bolus transport/lingual motion: Repetitive/disorganized tongue motion Oral residue: Majority of bolus remaining Location of oral residue : Tongue; Palate Initiation of swallow : Pyriform sinuses Soft palate elevation: No bolus between soft palate (SP)/pharyngeal wall (PW) Laryngeal elevation: Partial superior movement of thyroid cartilage/partial approximation of arytenoids to epiglottic petiole Anterior hyoid excursion: Partial Epiglottic movement: Complete Laryngeal vestibule closure: Incomplete, narrow column air/contrast in laryngeal vestibule Pharyngeal stripping  wave : Present - diminished Pharyngeal contraction (A/P view only): N/A Pharyngoesophageal segment opening: Partial distention/partial duration, partial obstruction of flow Tongue base retraction: Wide column of contrast or air between tongue base and PPW Pharyngeal residue: Majority of contrast within or on pharyngeal structures Location of pharyngeal residue: Diffuse (>3 areas) Penetration/Aspiration Scale (PAS) score: 8.  Material enters airway, passes BELOW cords without attempt by patient to eject out (silent aspiration); 4.  Material enters airway, CONTACTS cords then ejected out (penetration during; aspiration after the swallow)     Puree: Puree Puree: Impaired Puree - Impairment: Oral Impairment; Pharyngeal impairment Lip Closure: Interlabial escape, no progression to anterior lip Bolus transport/lingual motion: Repetitive/disorganized tongue motion Oral residue: Majority of bolus remaining Location of oral residue : Tongue; Palate Initiation of swallow: Pyriform sinuses Soft palate elevation: No bolus between soft  palate (SP)/pharyngeal wall (PW) Laryngeal elevation: Partial superior movement of thyroid cartilage/partial approximation of arytenoids to epiglottic petiole Anterior hyoid excursion: Partial Epiglottic movement: Complete Laryngeal vestibule closure: Incomplete, narrow column air/contrast in laryngeal vestibule Pharyngeal stripping wave : Present - diminished Pharyngeal contraction (A/P view only): N/A Pharyngoesophageal segment opening: Partial distention/partial duration, partial obstruction of flow Tongue base retraction: Wide column of contrast or air between tongue base and PPW Pharyngeal residue: Majority of contrast within or on pharyngeal structures Location of pharyngeal residue: Diffuse (>3 areas) Penetration/Aspiration Scale (PAS) score: 4.  Material enters airway, CONTACTS cords then ejected out    Solid: Solid Solid: Not Tested    Pill: Pill Pill: Not Tested    Compensatory Strategies: No data recorded     General Information: Caregiver present: Yes (daughter)   Diet Prior to this Study: NPO    No data recorded   No data recorded   Supplemental O2: Nasal cannula (2L/min)    History of Recent Intubation: No   Behavior/Cognition: Alert; Cooperative; Pleasant mood  Self-Feeding Abilities: Dependent for feeding  Baseline vocal quality/speech: Aphonic; Dysphonic; Hypophonia/low volume  Volitional Cough: Able to elicit  Volitional Swallow: Able to elicit  No data recorded  Goal Planning: Prognosis for improved oropharyngeal function: Guarded  Barriers to Reach Goals: Overall medical prognosis; Severity of deficits  No data recorded Patient/Family Stated Goal: To eat/drink safely and comfortably  Consulted and agree with results and recommendations: Patient; Family member/caregiver; Physician; Nurse   Pain: No data recorded  End of Session: Start Time:SLP Start Time (ACUTE ONLY): 1205  Stop Time: SLP Stop Time (ACUTE ONLY): 1255  Time  Calculation:SLP Time Calculation (min) (ACUTE ONLY): 50 min  Charges: SLP Evaluations $ SLP Speech Visit: 1 Visit [id number])  SLP Evaluations $BSS Swallow: 1 Procedure $MBS Swallow: 1 Procedure $Swallowing Treatment: 1 Procedure   SLP visit diagnosis: SLP Visit Diagnosis: Dysphagia, oropharyngeal phase (R13.12)    Past Medical History:  Past Medical History:  Diagnosis Date   Asthma    Edema    MILD OF FEET   GERD (gastroesophageal reflux disease)    Hypertension    Mood disorder ([place])    chronic anxiety   Past Surgical History:  Past Surgical History:  Procedure Laterality Date   CATARACT EXTRACTION W/PHACO Left 02/16/2016   Procedure: CATARACT EXTRACTION PHACO AND INTRAOCULAR LENS PLACEMENT ([place]);  Surgeon: Eulogio Bear, MD;  Location: [facility] ORS;  Service: Ophthalmology;  Laterality: Left;  [place] 1.10 AP% 10.6 CDE 7.41 Fluid Pack Lot # [id number]   CATARACT EXTRACTION W/PHACO Right 03/08/2016   Procedure: CATARACT EXTRACTION PHACO AND INTRAOCULAR LENS PLACEMENT ([place]);  Surgeon: Leory Plowman  Alma Friendly, MD;  Location: [facility] ORS;  Service: Ophthalmology;  Laterality: Right;  [place] 1.14 AP% 11.6 CDE 11.80 Fluid Pack Lot # [id number]   HERNIA REPAIR     HIP ARTHROPLASTY Left 07/06/2016   Procedure: ARTHROPLASTY BIPOLAR HIP (HEMIARTHROPLASTY);  Surgeon: Hessie Knows, MD;  Location: [facility] ORS;  Service: Orthopedics;  Laterality: Left;   Cherrie Gauze, M.S., [facility] [phone] [place])  Quintella Baton 08/06/2022, 1:37 PM

## 2022-08-06 NOTE — Evaluation (Addendum)
Clinical/Bedside Swallow Evaluation Patient Details  Name: Brandon Calhoun MRN: [id number] Date of Birth: [birth year]  Today's Date: 08/06/2022 Time: SLP Start Time (ACUTE ONLY): 0845 SLP Stop Time (ACUTE ONLY): 0915 SLP Time Calculation (min) (ACUTE ONLY): 30 min  Past Medical History:  Past Medical History:  Diagnosis Date   Asthma    Edema    MILD OF FEET   GERD (gastroesophageal reflux disease)    Hypertension    Mood disorder ([place])    chronic anxiety   Past Surgical History:  Past Surgical History:  Procedure Laterality Date   CATARACT EXTRACTION W/PHACO Left 02/16/2016   Procedure: CATARACT EXTRACTION PHACO AND INTRAOCULAR LENS PLACEMENT ([place]);  Surgeon: Eulogio Bear, MD;  Location: [facility] ORS;  Service: Ophthalmology;  Laterality: Left;  [place] 1.10 AP% 10.6 CDE 7.41 Fluid Pack Lot # [id number]   CATARACT EXTRACTION W/PHACO Right 03/08/2016   Procedure: CATARACT EXTRACTION PHACO AND INTRAOCULAR LENS PLACEMENT ([place]);  Surgeon: Eulogio Bear, MD;  Location: [facility] ORS;  Service: Ophthalmology;  Laterality: Right;  [place] 1.14 AP% 11.6 CDE 11.80 Fluid Pack Lot # [id number]   HERNIA REPAIR     HIP ARTHROPLASTY Left 07/06/2016   Procedure: ARTHROPLASTY BIPOLAR HIP (HEMIARTHROPLASTY);  Surgeon: Hessie Knows, MD;  Location: [facility] ORS;  Service: Orthopedics;  Laterality: Left;   HPI:  Per H&P, pt is a 89 y.o. male with medical history significant of hypertension, prediabetes (hga1c=6.0), parkinsons, gerd, asthma, apparently presents with c/o hypoxia.  , pox 80's, and rhonchi, requiring bipap in ED to maintain o2 sat >90%,  pt apparently very rhonchorous per ED, and thought to have aspirated.  Pt daughter thinks that applesauce might have been the culprit.  Pt has slight cough, nonproductive, and dyspnea.  Pt denies fever, chills, cp, palp, abd pain, diarrhea, brbpr, black stool, dysuria, hematuria.  Pt has not had a prior formal speech evaluation in the past.  Pt apparently at baseline  walks with walker about 1/2 mile per day, and is able to feed himself. No special dietary restrictions currently.    Assessment / Plan / Recommendation  Clinical Impression   Pt seen today for BSE. Pt appeared alert, cooperative, and pleasant throughout eval. Pt's daughter and nursing present upon SLP arrival. Pt w/ c/o food feeling "stuck in his throat." Pt and daughter reported stress during meal times in his nursing home d/t frequent coughing during meals and fear of being judged by others at the table. Pt left sitting up in bed w/ call button in reach, bed alarm set, and duaghter present. RN updated to give critical meds w/ puree.  Pt on 2L O2 via [place]; afebrile; WBC elevated.  OM exam complete and notable for generalized lingual weakness and mild tremor. ROM symmetrical and WFL. Noted adequate natural dentition. Pt's vocal quality weak, breathy, and hoarse. Noted mild articulation imprecision and reduced breath support, resulting in slow speech w/ frequent pauses to replenish breath. Pt intelligible in majority of conversational speech. Intelligibility increased w/ reduced environmental distractions/noises. Findings from OM exam and voice appear consistent w/ Parkinson's diagnosis.  Pt appears to present with suspected s/s of pharyngeal dysphagia. Pt exhibited consistent, immediate coughing w/ po trials of ice chips and nectar thick liquids; c/o bolus "stuck in throat"; wet vocal quality post-po's; expectoration of phlegm/liquid immediately post-nectar liquid. No overt s/s of aspiration noted w/ puree texture. Pt at INCREASED risk of aspiration/aspiration pneumonia at this time; following aspiration precautions w/ ALL po's helps to reduce risk of aspiration.  Administered trials of ice chips, nectar thick liquids, and puree. Limited intake d/t consistent coughing, throat clearing, and wet vocal quality. During oral phase, pt exhibited adequate bolus control/management and clear oral cavity post-po's.  During pharyngeal phase w/ ice chips/nectar thick liquids, noted immediate/delayed coughing, multiple swallows, frequent/immediate throat clearing, and wet vocal quality post-po's. Pt given spit cup following TSP of nectar thick liquid and expectorated yellow-colored phlegm. Given puree, pt exhibited multiple swallows and reported c/o of globus sensation, but NO overt, immediate coughing, throat clearing, nor wet vocal quality appreciated.  Pt/daughtered educated on SLP POC, plan for instrumental testing, and compensatory strategies w/ swallow. Pt/daughter appreciative and agreed.  Recommend continue NPO diet w/ exception of critical meds w/ puree. Crush meds if possible. RN updated. Plan for MBSS today for objective information regarding pharyngeal swallow. Recommend palliative care consult for [place] and dietician consult to address low intake.   SLP Visit Diagnosis: Dysphagia, oropharyngeal phase (R13.12)    Aspiration Risk  Moderate aspiration risk;Severe aspiration risk    Diet Recommendation   NPO w/ exception of critical meds w/ puree.  Medication Administration: Crushed with puree    Other  Recommendations Recommended Consults:  (Palliative care and dietician) Oral Care Recommendations: Oral care QID;Oral care prior to ice chip/H20 Caregiver Recommendations: Remove water pitcher    Recommendations for follow up therapy are one component of a multi-disciplinary discharge planning process, led by the attending physician.  Recommendations may be updated based on patient status, additional functional criteria and insurance authorization.  Follow up Recommendations Follow physician's recommendations for discharge plan and follow up therapies      Assistance Recommended at Discharge  Full  Functional Status Assessment Patient has had a recent decline in their functional status and/or demonstrates limited ability to make significant improvements in function in a reasonable and predictable  amount of time  Frequency and Duration min 2x/week  2 weeks       Prognosis Prognosis for improved oropharyngeal function: Guarded Barriers to Reach Goals: Severity of deficits;Time post onset      Swallow Study   General Date of Onset: 08/05/22 HPI: Per H&P, pt is a 89 y.o. male with medical history significant of hypertension, prediabetes (hga1c=6.0), parkinsons, gerd, asthma, apparently presents with c/o hypoxia.  , pox 80's, and rhonchi, requiring bipap in ED to maintain o2 sat >90%,  pt apparently very rhonchorous per ED, and thought to have aspirated.  Pt daughter thinks that applesauce might have been the culprit.  Pt has slight cough, nonproductive, and dyspnea.  Pt denies fever, chills, cp, palp, abd pain, diarrhea, brbpr, black stool, dysuria, hematuria.  Pt has not had a prior formal speech evaluation in the past.  Pt apparently at baseline walks with walker about 1/2 mile per day, and is able to feed himself. No special dietary restrictions currently. Type of Study: Bedside Swallow Evaluation Previous Swallow Assessment: 08/05/19 (No oropharyngeal dysphagia noted) Diet Prior to this Study: NPO Temperature Spikes Noted: No (WBC 12.3) Respiratory Status: Nasal cannula (2L) History of Recent Intubation: No Behavior/Cognition: Alert;Cooperative;Pleasant mood Oral Cavity Assessment: Within Functional Limits Oral Care Completed by SLP: No Oral Cavity - Dentition: Adequate natural dentition Self-Feeding Abilities: Needs assist;Needs set up (Recommended by daughter) Patient Positioning: Upright in bed Baseline Vocal Quality: Breathy;Hoarse;Low vocal intensity (characteristic of Parkinson's) Volitional Cough: Weak Volitional Swallow: Able to elicit    Oral/Motor/Sensory Function Overall Oral Motor/Sensory Function: Moderate impairment Facial ROM: Within Functional Limits Facial Symmetry: Within Functional Limits Facial Strength: Reduced  right;Reduced left Facial Sensation: Within  Functional Limits Lingual ROM: Within Functional Limits Lingual Symmetry: Within Functional Limits Lingual Strength: Reduced Lingual Sensation: Within Functional Limits Velum: Within Functional Limits Mandible: Within Functional Limits   Ice Chips Ice chips: Impaired Presentation: Spoon (x4) Oral Phase Impairments: Reduced lingual movement/coordination (Generalized weakness) Oral Phase Functional Implications: Oral holding;Prolonged oral transit (min) Pharyngeal Phase Impairments: Suspected delayed Swallow;Decreased hyoid-laryngeal movement;Multiple swallows;Wet Vocal Quality;Throat Clearing - Immediate;Throat Clearing - Delayed;Cough - Immediate;Cough - Delayed   Thin Liquid Thin Liquid: Not tested    Nectar Thick Nectar Thick Liquid: Impaired Presentation: Spoon (x1 TSP) Oral Phase Impairments: Reduced lingual movement/coordination Pharyngeal Phase Impairments: Cough - Immediate;Throat Clearing - Immediate;Wet Vocal Quality;Multiple swallows (expectorate w/ phlegm)   Honey Thick Honey Thick Liquid: Not tested   Puree Puree: Within functional limits Presentation: Spoon (x 1 bite) Other Comments: pt reported feeling "stuck in throat"   Solid     Solid: Not tested     Randall Hiss Graduate Clinician [place], Speech Pathology   Randall Hiss 08/06/2022,10:15 AM  Cosigned by: Cherrie Gauze, M.S., [facility] [phone] ([place])

## 2022-08-06 NOTE — Telephone Encounter (Signed)
Received request from nurse on patient's LTC hall to call his daughter and HCPOA to discuss next steps.  Patient has aspiration of all liquids and they are recommending a pureed diet. Discussed concern that this could be a reflection of his decline and the best way to continue the aspects of life he enjoys such as eating/drinking/socializing would be to move forward with Hospice Care. Desire repeat speech and swallow evaluation upon return to facility and hope to return to a full/regular diet. Discussed risk of pneumonia and concern that this will be a continued risk for him. All questions answered and encouraged continued [place] conversations with the inpatient team.

## 2022-08-06 NOTE — Progress Notes (Signed)
Progress Note   Patient: Brandon Calhoun [id number] DOB: August 16, 1929 DOA: 08/05/2022     1 DOS: the patient was seen and examined on 08/06/2022   Brief hospital course: SHMUEL KEDROWSKI is a 89 y.o. male with past medical history significant for hypertension, prediabetes (hba1c=6.0), parkinsons, gerd, asthma, apparently presents with c/o hypoxia.  , pox 80's, and rhonchi, requiring bipap in ED to maintain o2 sat >90%,  pt apparently very rhonchorous per ED, and thought to have aspirated.  Pt daughter thought that applesauce might have been the culprit.  Pt has slight cough, nonproductive, and dyspnea.  Pt denies fever, chills, chest pain, palpitations, abd pain, diarrhea, brbpr, black stool, dysuria, hematuria.  Pt has not had a prior formal speech evaluation in the past.  Pt apparently at baseline walks with walker about 1/2 mile per day, and is able to feed himself. No special dietary restrictions currently.    In ED, T 99, P 93  Bp 140/69  R 24, pox 70's per ED on RA, 97% on Bipap at FIo2 30%,    Pt was slowly weaned off bipap to 6L o2 [place] while in the ED.   Wbc 13.3, hgb 14.3, Plt 315 Na 135, K 3.7, Bun 47, Creat 1.2, Ast 23, Alt 7 Lactic acid 1.4, BNP 57.7   Trop 11-> 16   Respiratory panel negative Blood culture no growth > 12 hours   CXR No acute cardiopulmonary process.   Pt given meropenem, Rocephin, zithromax in ED    Pt will be admitted for acute respiratory failure with hypoxia secondary to aspiration.   Assessment and Plan: Acute Respiratory Failure with hypoxia  Patient presented to the ER and was tachypneic with increased work of breathing and room air pulse oximetry in the 70s.  He initially required BiPAP and has been weaned down from 6 L of oxygen to 2 L Acute respiratory failure appears to be secondary to suspected aspiration pneumonia We will attempt to wean patient off oxygen once acute illness improves or resolves.    Aspiration pneumonia Patient has  dysphagia per daughter He presented to the ER for evaluation of shortness of breath and was noted to be hypoxic and had diffuse rhonchi on presentation as well as white count of 13.3 Continue Unasyn Follow-up results of blood cultures Appreciate speech therapy input, patient had a bedside swallow evaluation which showed findings concerning for oropharyngeal phase dysphagia with a moderate to severe aspiration risk. Recommend to keep patient n.p.o. except for administration of critical meds with pure Patient is scheduled for modified barium swallow    Parkinson disease Continue sinemet    Asthma Not acutely exacerbated Cont Symbicort-> Dulera Cont Singulair 35m po qhs   Gerd Cont PPI   Chronic lower extremity venous stasis Continue Torsemide Continue Aldactone   Bph Continue Flomax   Anxiety Continue Celexa  Hypokalemia Supplement potassium        Physical Exam: Vitals:   08/06/22 0312 08/06/22 0626 08/06/22 0832 08/06/22 1150  BP: 126/61 139/66 132/64 (!) 125/49  Pulse: 70 71 66 68  Resp: 18 18 18 18  $ Temp: (!) 97.5 F (36.4 C) 98.1 F (36.7 C) 97.7 F (36.5 C) (!) 97.5 F (36.4 C)  TempSrc: Oral  Oral   SpO2: 98% 97% 97% 96%  Weight: 65 kg     Height: 5' 9"$  (1.753 m)      Physical Exam Vitals and nursing note reviewed.  Constitutional:      Comments: Chronically ill-appearing,  frail  HENT:     Head: Normocephalic and atraumatic.     Mouth/Throat:     Mouth: Mucous membranes are moist.  Cardiovascular:     Rate and Rhythm: Normal rate and regular rhythm.  Pulmonary:     Effort: Pulmonary effort is normal.     Breath sounds: Examination of the right-upper field reveals rhonchi. Examination of the left-upper field reveals rhonchi. Examination of the right-middle field reveals rhonchi. Examination of the left-middle field reveals rhonchi. Examination of the right-lower field reveals rhonchi. Examination of the left-lower field reveals rhonchi. Rhonchi  present.  Abdominal:     General: Bowel sounds are normal.     Palpations: Abdomen is soft.  Musculoskeletal:        General: Normal range of motion.     Cervical back: Normal range of motion and neck supple.  Skin:    Comments: Ecchymosis involving both forearms  Neurological:     General: No focal deficit present.     Mental Status: He is alert.     Motor: Weakness present.  Psychiatric:        Mood and Affect: Mood is anxious.     Data Reviewed: Labs reviewed.  Sodium 137, potassium 3.4, chloride 102, bicarb 24, glucose 118, BUN 41, creatinine 0.90, calcium 8.4, total protein 6.8, albumin 3.5, AST 18, ALT 7, alkaline phosphatase 91, total bilirubin 1.0, white count 12.3, hemoglobin 13.1, hematocrit 39, platelet count 258 There are no new results to review at this time.  Family Communication: Discussed patient's condition and treatment plan with his daughter [name] at the bedside.  All questions and concerns have been addressed.  She verbalizes understanding and agrees to the plan.  Disposition: Status is: Inpatient Remains inpatient appropriate because: Patient is on IV antibiotics for aspiration pneumonia and needs further evaluation of his swallow function  Planned Discharge Destination: Skilled nursing facility    Time spent: 35 minutes  Author: [name], MD 08/06/2022 12:15 PM  For on call review [email or web address].

## 2022-08-07 DIAGNOSIS — Z515 Encounter for palliative care: Secondary | ICD-10-CM

## 2022-08-07 DIAGNOSIS — G20B1 Parkinson's disease with dyskinesia, without mention of fluctuations: Secondary | ICD-10-CM

## 2022-08-07 DIAGNOSIS — Z7189 Other specified counseling: Secondary | ICD-10-CM

## 2022-08-07 DIAGNOSIS — J9601 Acute respiratory failure with hypoxia: Secondary | ICD-10-CM | POA: Diagnosis not present

## 2022-08-07 MED ORDER — SALINE SPRAY 0.65 % NA SOLN
1.0000 | NASAL | Status: DC | PRN
  Filled 2022-08-07: qty 44

## 2022-08-07 NOTE — Progress Notes (Cosign Needed)
Brandon Calhoun notified

## 2022-08-07 NOTE — Discharge Summary (Signed)
Physician Discharge Summary   Patient: Brandon Calhoun MRN: [id number] DOB: 04/07/1930  Admit date:     08/05/2022  Discharge date: 08/07/22  Discharge Physician: Yovana Scogin   PCP: Dewayne Shorter, MD   Recommendations at discharge:   Patient has been discharged back to [facility] with hospice to be provided by [facility]  Discharge Diagnoses: Principal Problem:   Acute respiratory failure with hypoxia ([place]) Active Problems:   Aspiration pneumonia ([place])   HTN (hypertension)   Parkinson's disease   GERD (gastroesophageal reflux disease)   BPH with obstruction/lower urinary tract symptoms   Leukocytosis   Asthma  Resolved Problems:   * No resolved hospital problems. [facility] Course:  Brandon Calhoun is a 89 y.o. male with medical history significant of hypertension, prediabetes (hga1c=6.0), parkinsons, gerd, asthma, apparently presents with c/o hypoxia.  , pox 80's, and rhonchi, requiring bipap in ED to maintain o2 sat >90%,  pt apparently very rhonchorous per ED, and thought to have aspirated.  Pt daughter thinks that applesauce might have been the culprit.  Pt has slight cough, nonproductive, and dyspnea.  Pt denies fever, chills, chest pain, palpitations, abd pain, diarrhea, brbpr, black stool, dysuria, hematuria.  Pt has not had a prior formal speech evaluation in the past.  Pt apparently at baseline walks with walker about 1/2 mile per day, and is able to feed himself. No special dietary restrictions currently.  He was initially hypoxic with room air pulse oximetry in the 70s and initially required BiPAP.  He was weaned off BiPAP and placed on 6 L of oxygen and is currently on 2 L     Assessment and Plan:   Acute Respiratory Failure with hypoxia  Patient presented to the ER and was tachypneic with increased work of breathing and room air pulse oximetry in the 70s.  He initially required BiPAP and has been weaned down from 6 L of oxygen to 2 L Acute  respiratory failure appears to be secondary to suspected aspiration pneumonia Is currently on 2 L of oxygen via nasal cannula       Aspiration pneumonia Patient has dysphagia per daughter He presented to the ER for evaluation of shortness of breath and was noted to be hypoxic and had diffuse rhonchi on presentation as well as white count of 13.3 Appreciate speech therapy input, patient had a bedside swallow evaluation which showed findings concerning for oropharyngeal phase dysphagia with a moderate to severe aspiration risk. Recommended to keep patient n.p.o. except for administration of critical meds with pure Patient had a modified barium swallow and was noted to have severe-profound oropharyngeal dysphagia in the setting of Parkinson's disease and advanced age.  Speech therapy recommended alternative means of nutrition. Discussed the recommendation with patient's daughter and she and her father have opted for hospice and for him to have pleasure feeds as tolerated. Will discontinue Unasyn       Parkinson disease Continue sinemet     Asthma Not acutely exacerbated Cont Symbicort-> Dulera Cont Singulair 47m po qhs   Gerd Cont PPI   Chronic lower extremity venous stasis Continue Torsemide Continue Aldactone   Bph Continue Flomax   Anxiety Continue Celexa   Hypokalemia Supplement potassium  Consultants: Palliative care, speech therapy Procedures performed: Modified barium swallow Disposition: Hospice care Diet recommendation:  Full liquid diet DISCHARGE MEDICATION: Allergies as of 08/07/2022       Reactions   Doxycycline         Medication List  STOP taking these medications    alum & mag hydroxide-simeth 200-200-20 MG/5ML suspension Commonly known as: MAALOX/MYLANTA   carbidopa-levodopa 50-200 MG tablet Commonly known as: SINEMET CR   cetirizine 10 MG tablet Commonly known as: ZYRTEC   furosemide 40 MG tablet Commonly known as: LASIX    magnesium hydroxide 400 MG/5ML suspension Commonly known as: MILK OF MAGNESIA   montelukast 10 MG tablet Commonly known as: SINGULAIR   omeprazole 20 MG capsule Commonly known as: PRILOSEC   PEPTO-BISMOL PO   spironolactone 50 MG tablet Commonly known as: ALDACTONE   tamsulosin 0.4 MG Caps capsule Commonly known as: FLOMAX   torsemide 20 MG tablet Commonly known as: DEMADEX       TAKE these medications    acetaminophen 325 MG tablet Commonly known as: TYLENOL Take 650 mg by mouth every 6 (six) hours as needed for mild pain.   budesonide-formoterol 160-4.5 MCG/ACT inhaler Commonly known as: SYMBICORT Inhale 2 puffs into the lungs 2 (two) times daily. Rinse mouth after use   citalopram 10 MG tablet Commonly known as: CELEXA Take 10 mg by mouth See admin instructions. Take one tablet (10 mg) once daily on Tuesday, Wednesday, Friday, Saturday and Sunday   Dermacloud Oint Apply 1 Application topically 3 (three) times daily.   fluticasone 50 MCG/ACT nasal spray Commonly known as: FLONASE Place 2 sprays into the nose daily.   OcuSoft Eyelid Cleansing Pads Apply 1 application topically at bedtime. For blepharitis for 90 days   PROSOURCE PO Twice daily for wound healing.   triamcinolone cream 0.1 % Commonly known as: KENALOG Apply 1 Application topically every 12 (twelve) hours as needed. Apply to right thigh, L thigh, Abd topically two times a day for rash morning and evening. apply with petroleum jelly   ZINC OXIDE EX Apply 1 Application topically every 12 (twelve) hours as needed.               Discharge Care Instructions  (From admission, onward)           Start     Ordered   08/07/22 0000  Discharge wound care:       Comments: Apply Duoderm q 72 hours   08/07/22 1442            Discharge Exam: Filed Weights   08/05/22 2023 08/06/22 0312  Weight: 75.8 kg 65 kg   Vitals and nursing note reviewed.  HENT:     Head: Normocephalic and  atraumatic.  Cardiovascular:     Rate and Rhythm: Normal rate and regular rhythm.  Pulmonary:     Effort: Pulmonary effort is normal.     Breath sounds: Examination of the right-upper field reveals rhonchi. Examination of the left-upper field reveals rhonchi. Examination of the right-middle field reveals rhonchi. Examination of the left-middle field reveals rhonchi. Examination of the right-lower field reveals rhonchi. Examination of the left-lower field reveals rhonchi. Rhonchi present.  Abdominal:     General: Bowel sounds are normal.     Palpations: Abdomen is soft.  Musculoskeletal:        General: Normal range of motion.     Cervical back: Normal range of motion and neck supple.  Skin:    General: Skin is warm and dry.  Neurological:     Mental Status: He is alert.     Motor: Weakness present.  Psychiatric:        Mood and Affect: Mood normal.        Behavior: Behavior normal.  Condition at discharge: stable  The results of significant diagnostics from this hospitalization (including imaging, microbiology, ancillary and laboratory) are listed below for reference.   Imaging Studies: DG Swallowing Func-Speech Pathology  Result Date: 08/06/2022 Calla Kicks     08/06/2022  1:41 PM Modified Barium Swallow Study Patient Details Name: HOYLE MONTERO MRN: [id number] Date of Birth: 20-Sep-1929 Today's Date: 08/06/2022 HPI/PMH: HPI: Per H&P, pt is a 89 y.o. male with medical history significant of hypertension, prediabetes (hga1c=6.0), parkinsons, gerd, asthma, apparently presents with c/o hypoxia.  , pox 80's, and rhonchi, requiring bipap in ED to maintain o2 sat >90%,  pt apparently very rhonchorous per ED, and thought to have aspirated.  Pt daughter thinks that applesauce might have been the culprit.  Pt has slight cough, nonproductive, and dyspnea.  Pt denies fever, chills, cp, palp, abd pain, diarrhea, brbpr, black stool, dysuria, hematuria.  Pt has not had a prior formal  speech evaluation in the past.  Pt apparently at baseline walks with walker about 1/2 mile per day, and is able to feed himself. No special dietary restrictions currently. Clinical Impression: Clinical Impression: Pt presents with a severe-profound oropharyngeal dysphagia. Suspect pt's dysphagia is chronic in the setting of Parkinson's Disease and advanced age. Oral swallow is notable for oral residual which is increased with viscosity as well as repetitive/disorganized lingual motions during A-P transit. Pharyngeal swallow is notable for moderate-severe pharyngeal stasis which is increased with viscosity, before the swallow silent aspiration of thin liquids, during the swallow silent aspiration of thin liquids, during the swallow laryngeal penetration with nectar-thick liquids and pureed, and after swallow silent aspiration of honey thick liquids. After the swallow aspiration was resultant of pharyngeal stasis. Suspect pt is at risk for post-swallow aspiration of pharyngeal stasis regardless of consistency. Cued secondary swallows were limited in effectiveness in reducing pharyngeal stasis. Cued cough did not clear penetration or aspiration. Above mention deficits attributed to: decreased lingual strength and coordination, reduced base of tongue retraction, reduced hyolaryngeal elevation/excursion, reduced/mistimed laryngeal vestibule closure, reduced pharyngeal stripping wave, reduced duration of UES opening, and reduced laryngeal and pharyngeal sensation. At present, a safe oral diet cannot be recommended. Recommend NPO with Palliative Care Consult to discuss [place]. Pt's daughter and pt educated at length immediately following MBSS re: results of MBSS, rationale for current recommendations, changes to swallow function in pt's with Parkinson's Disease, risks associated with aspiration/aspiration PNA, and SLP POC. Pt and daughter shown videofluoroscopic images for education. Pt and daughter asked pertinent questions  and verbalized satisfaction with answers provided by SLP. Pt's daughter noted that pt not agreeable to NG or PEG tube. MD made aware. Pt's daughter requesting dysphagia ex's to help maintain any preserved swallow function. SLP to f/u per POC for dysphagia tx. RN and MD made aware of results, recommendations, and SLP POC. Factors that may increase risk of adverse event in presence of aspiration ([place] 2021): Factors that may increase risk of adverse event in presence of aspiration ([place] 2021): Frail or deconditioned; Aspiration of thick, dense, and/or acidic materials; Frequent aspiration of large volumes; Limited mobility; Poor general health and/or compromised immunity; Respiratory or GI disease Recommendations/Plan: Swallowing Evaluation Recommendations Swallowing Evaluation Recommendations Recommendations: NPO Medication Administration: Via alternative means Oral care recommendations: Oral care QID (4x/day); Staff/trained caregiver to provide oral care Recommended consults: Consider Palliative care Caregiver Recommendations: Remove water pitcher Treatment Plan Treatment Plan Treatment recommendations: Therapy as outlined in treatment plan below Follow-up recommendations: Skilled nursing-short  term rehab (<3 hours/day) Functional status assessment: Patient has had a recent decline in their functional status and demonstrates the ability to make significant improvements in function in a reasonable and predictable amount of time. Treatment frequency: Min 2x/week Treatment duration: 1 week (trial of therapy while pt in house) Interventions: Oropharyngeal exercises; Patient/family education Recommendations Recommendations for follow up therapy are one component of a multi-disciplinary discharge planning process, led by the attending physician.  Recommendations may be updated based on patient status, additional functional criteria and insurance authorization. Assessment: Orofacial Exam: Orofacial  Exam Oral Cavity: Oral Hygiene: WFL Oral Cavity - Dentition: Adequate natural dentition Orofacial Anatomy: WFL Oral Motor/Sensory Function: Generalized oral weakness Anatomy: Anatomy: WFL (lordosis) Thin Liquids: Thin Liquids (Level 0) Thin Liquids : Impaired Bolus delivery method: Spoon; Cup Thin Liquid - Impairment: Oral Impairment; Pharyngeal impairment Lip Closure: Interlabial escape, no progression to anterior lip Tongue control during bolus hold: Posterior escape of greater than half of bolus Bolus transport/lingual motion: Repetitive/disorganized tongue motion Oral residue: Residue collection on oral structures Location of oral residue : Tongue; Floor of mouth Initiation of swallow : Pyriform sinuses Soft palate elevation: No bolus between soft palate (SP)/pharyngeal wall (PW) Anterior hyoid excursion: Partial Epiglottic movement: Partial Laryngeal vestibule closure: Incomplete, narrow column air/contrast in laryngeal vestibule Pharyngeal stripping wave : Present - diminished Pharyngeal contraction (A/P view only): N/A Pharyngoesophageal segment opening: Partial distention/partial duration, partial obstruction of flow Tongue base retraction: Wide column of contrast or air between tongue base and PPW Pharyngeal residue: Collection of residue within or on pharyngeal structures Location of pharyngeal residue: Diffuse (>3 areas) Penetration/Aspiration Scale (PAS) score: 8.  Material enters airway, passes BELOW cords without attempt by patient to eject out (silent aspiration)  Mildly Thick Liquids: Mildly thick liquids (Level 2, nectar thick) Mildly thick liquids (Level 2, nectar thick): Impaired Bolus delivery method: Spoon; Cup Mildly Thick Liquid - Impairment: Oral Impairment; Pharyngeal impairment Lip Closure: Interlabial escape, no progression to anterior lip Tongue control during bolus hold: Not tested Bolus transport/lingual motion: Repetitive/disorganized tongue motion Oral residue: Residue collection on  oral structures Location of oral residue : Floor of mouth; Tongue; Palate; Lateral sulci Initiation of swallow : Pyriform sinuses Soft palate elevation: No bolus between soft palate (SP)/pharyngeal wall (PW) Laryngeal elevation: Partial superior movement of thyroid cartilage/partial approximation of arytenoids to epiglottic petiole Anterior hyoid excursion: Partial Epiglottic movement: Complete Laryngeal vestibule closure: Incomplete, narrow column air/contrast in laryngeal vestibule Pharyngeal stripping wave : Present - diminished Pharyngeal contraction (A/P view only): N/A Pharyngoesophageal segment opening: Partial distention/partial duration, partial obstruction of flow Tongue base retraction: Wide column of contrast or air between tongue base and PPW Pharyngeal residue: Collection of residue within or on pharyngeal structures Location of pharyngeal residue: Diffuse (>3 areas) Penetration/Aspiration Scale (PAS) score: 5.  Material enters airway, CONTACTS cords and not ejected out (during and after the swallow)  Moderately Thick Liquids: Moderately thick liquids (Level 3, honey thick) Moderately thick liquids (Level 3, honey thick): Impaired Bolus delivery method: Spoon Moderately Thick Liquid - Impairment: Oral Impairment; Pharyngeal impairment Lip Closure: Interlabial escape, no progression to anterior lip Tongue control during bolus hold: Not tested Bolus transport/lingual motion: Repetitive/disorganized tongue motion Oral residue: Majority of bolus remaining Location of oral residue : Tongue; Palate Initiation of swallow : Pyriform sinuses Soft palate elevation: No bolus between soft palate (SP)/pharyngeal wall (PW) Laryngeal elevation: Partial superior movement of thyroid cartilage/partial approximation of arytenoids to epiglottic petiole Anterior hyoid excursion: Partial Epiglottic movement: Complete Laryngeal  vestibule closure: Incomplete, narrow column air/contrast in laryngeal vestibule Pharyngeal  stripping wave : Present - diminished Pharyngeal contraction (A/P view only): N/A Pharyngoesophageal segment opening: Partial distention/partial duration, partial obstruction of flow Tongue base retraction: Wide column of contrast or air between tongue base and PPW Pharyngeal residue: Majority of contrast within or on pharyngeal structures Location of pharyngeal residue: Diffuse (>3 areas) Penetration/Aspiration Scale (PAS) score: 8.  Material enters airway, passes BELOW cords without attempt by patient to eject out (silent aspiration); 4.  Material enters airway, CONTACTS cords then ejected out (penetration during; aspiration after the swallow)  Puree: Puree Puree: Impaired Puree - Impairment: Oral Impairment; Pharyngeal impairment Lip Closure: Interlabial escape, no progression to anterior lip Bolus transport/lingual motion: Repetitive/disorganized tongue motion Oral residue: Majority of bolus remaining Location of oral residue : Tongue; Palate Initiation of swallow: Pyriform sinuses Soft palate elevation: No bolus between soft palate (SP)/pharyngeal wall (PW) Laryngeal elevation: Partial superior movement of thyroid cartilage/partial approximation of arytenoids to epiglottic petiole Anterior hyoid excursion: Partial Epiglottic movement: Complete Laryngeal vestibule closure: Incomplete, narrow column air/contrast in laryngeal vestibule Pharyngeal stripping wave : Present - diminished Pharyngeal contraction (A/P view only): N/A Pharyngoesophageal segment opening: Partial distention/partial duration, partial obstruction of flow Tongue base retraction: Wide column of contrast or air between tongue base and PPW Pharyngeal residue: Majority of contrast within or on pharyngeal structures Location of pharyngeal residue: Diffuse (>3 areas) Penetration/Aspiration Scale (PAS) score: 4.  Material enters airway, CONTACTS cords then ejected out Solid: Solid Solid: Not Tested Pill: Pill Pill: Not Tested Compensatory Strategies:  No data recorded  General Information: Caregiver present: Yes (daughter)  Diet Prior to this Study: NPO   No data recorded  No data recorded  Supplemental O2: Nasal cannula (2L/min)   History of Recent Intubation: No  Behavior/Cognition: Alert; Cooperative; Pleasant mood Self-Feeding Abilities: Dependent for feeding Baseline vocal quality/speech: Aphonic; Dysphonic; Hypophonia/low volume Volitional Cough: Able to elicit Volitional Swallow: Able to elicit No data recorded Goal Planning: Prognosis for improved oropharyngeal function: Guarded Barriers to Reach Goals: Overall medical prognosis; Severity of deficits No data recorded Patient/Family Stated Goal: To eat/drink safely and comfortably Consulted and agree with results and recommendations: Patient; Family member/caregiver; Physician; Nurse Pain: No data recorded End of Session: Start Time:SLP Start Time (ACUTE ONLY): 1205 Stop Time: SLP Stop Time (ACUTE ONLY): 1255 Time Calculation:SLP Time Calculation (min) (ACUTE ONLY): 50 min Charges: SLP Evaluations $ SLP Speech Visit: 1 Visit [id number]) SLP Evaluations $BSS Swallow: 1 Procedure $MBS Swallow: 1 Procedure $Swallowing Treatment: 1 Procedure SLP visit diagnosis: SLP Visit Diagnosis: Dysphagia, oropharyngeal phase (R13.12) Past Medical History: Past Medical History: Diagnosis Date  Asthma   Edema   MILD OF FEET  GERD (gastroesophageal reflux disease)   Hypertension   Mood disorder ([place])   chronic anxiety Past Surgical History: Past Surgical History: Procedure Laterality Date  CATARACT EXTRACTION W/PHACO Left 02/16/2016  Procedure: CATARACT EXTRACTION PHACO AND INTRAOCULAR LENS PLACEMENT ([place]);  Surgeon: Eulogio Bear, MD;  Location: [facility] ORS;  Service: Ophthalmology;  Laterality: Left;  [place] 1.10 AP% 10.6 CDE 7.41 Fluid Pack Lot # [id number]  CATARACT EXTRACTION W/PHACO Right 03/08/2016  Procedure: CATARACT EXTRACTION PHACO AND INTRAOCULAR LENS PLACEMENT ([place]);  Surgeon: Eulogio Bear, MD;  Location: [facility]  ORS;  Service: Ophthalmology;  Laterality: Right;  [place] 1.14 AP% 11.6 CDE 11.80 Fluid Pack Lot # [id number]  HERNIA REPAIR    HIP ARTHROPLASTY Left 07/06/2016  Procedure: ARTHROPLASTY BIPOLAR HIP (HEMIARTHROPLASTY);  Surgeon: Hessie Knows, MD;  Location: [facility] ORS;  Service: Orthopedics;  Laterality: Left; Cherrie Gauze, M.S., [facility] [phone] [place]) Quintella Baton 08/06/2022, 1:37 PM   DG Chest Portable 1 View  Result Date: 08/05/2022 CLINICAL DATA:  [place] EXAM: PORTABLE CHEST 1 VIEW COMPARISON:  01/31/2020 FINDINGS: Normal mediastinum and cardiac silhouette. Normal pulmonary vasculature. No evidence of effusion, infiltrate, or pneumothorax. No acute bony abnormality. 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 IMPRESSION: No acute cardiopulmonary process. Electronically Signed   By: Suzy Bouchard M.D.   On: 08/05/2022 20:44    Microbiology: Results for orders placed or performed during the hospital encounter of 08/05/22  Resp panel by RT-PCR (RSV, Flu A&B, Covid) Anterior Nasal Swab     Status: None   Collection Time: 08/05/22  8:52 PM   Specimen: Anterior Nasal Swab  Result Value Ref Range Status   SARS Coronavirus 2 by RT PCR NEGATIVE NEGATIVE Final    Comment: (NOTE) SARS-CoV-2 target nucleic acids are NOT DETECTED.  The SARS-CoV-2 RNA is generally detectable in upper respiratory specimens during the acute phase of infection. The lowest concentration of SARS-CoV-2 viral copies this assay can detect is 138 copies/mL. A negative result does not preclude SARS-Cov-2 infection and should not be used as the sole basis for treatment or other patient management decisions. A negative result may occur with  improper specimen collection/handling, submission of specimen other than nasopharyngeal swab, presence of viral mutation(s) within the areas targeted by this assay, and inadequate number of  viral copies(<138 copies/mL). A negative result must be combined with clinical observations, patient history, and epidemiological information. The expected result is Negative.  Fact Sheet for Patients:  EntrepreneurPulse.com.au  Fact Sheet for Healthcare Providers:  IncredibleEmployment.be  This test is no t yet approved or cleared by the [place] FDA and  has been authorized for detection and/or diagnosis of SARS-CoV-2 by FDA under an Emergency Use Authorization (EUA). This EUA will remain  in effect (meaning this test can be used) for the duration of the COVID-19 declaration under Section 564(b)(1) of the Act, 21 U.S.C.section 360bbb-3(b)(1), unless the authorization is terminated  or revoked sooner.       Influenza A by PCR NEGATIVE NEGATIVE Final   Influenza B by PCR NEGATIVE NEGATIVE Final    Comment: (NOTE) The Xpert Xpress SARS-CoV-2/FLU/RSV plus assay is intended as an aid in the diagnosis of influenza from Nasopharyngeal swab specimens and should not be used as a sole basis for treatment. Nasal washings and aspirates are unacceptable for Xpert Xpress SARS-CoV-2/FLU/RSV testing.  Fact Sheet for Patients: EntrepreneurPulse.com.au  Fact Sheet for Healthcare Providers: IncredibleEmployment.be  This test is not yet approved or cleared by the [place] FDA and has been authorized for detection and/or diagnosis of SARS-CoV-2 by FDA under an Emergency Use Authorization (EUA). This EUA will remain in effect (meaning this test can be used) for the duration of the COVID-19 declaration under Section 564(b)(1) of the Act, 21 U.S.C. section 360bbb-3(b)(1), unless the authorization is terminated or revoked.     Resp Syncytial Virus by PCR NEGATIVE NEGATIVE Final    Comment: (NOTE) Fact Sheet for Patients: EntrepreneurPulse.com.au  Fact Sheet for Healthcare  Providers: IncredibleEmployment.be  This test is not yet approved or cleared by the [place] FDA and has been authorized for detection and/or diagnosis of SARS-CoV-2 by FDA under an Emergency Use Authorization (EUA). This EUA will remain in effect (meaning this test can be used) for the  duration of the COVID-19 declaration under Section 564(b)(1) of the Act, 21 U.S.C. section 360bbb-3(b)(1), unless the authorization is terminated or revoked.  Performed at [facility], [address] [place], [place] [address]   Blood culture (routine x 2)     Status: None (Preliminary result)   Collection Time: 08/05/22  8:56 PM   Specimen: BLOOD  Result Value Ref Range Status   Specimen Description BLOOD BLOOD RIGHT ARM  Final   Special Requests   Final    BOTTLES DRAWN AEROBIC AND ANAEROBIC Blood Culture adequate volume   Culture   Final    NO GROWTH 2 DAYS Performed at [facility], [address], [place], [place] [id number]    Report Status PENDING  Incomplete  Blood culture (routine x 2)     Status: None (Preliminary result)   Collection Time: 08/05/22  8:56 PM   Specimen: BLOOD  Result Value Ref Range Status   Specimen Description BLOOD BLOOD RIGHT ARM  Final   Special Requests   Final    BOTTLES DRAWN AEROBIC AND ANAEROBIC Blood Culture adequate volume   Culture   Final    NO GROWTH 2 DAYS Performed at [facility], [address], [place], [place] [address]    Report Status PENDING  Incomplete  MRSA Next Gen by PCR, Nasal     Status: None   Collection Time: 08/06/22  5:30 AM   Specimen: Nasal Mucosa; Nasal Swab  Result Value Ref Range Status   MRSA by PCR Next Gen NOT DETECTED NOT DETECTED Final    Comment: (NOTE) The GeneXpert MRSA Assay (FDA approved for NASAL specimens only), is one component of a comprehensive MRSA colonization surveillance program. It is not intended to diagnose MRSA infection nor to guide or monitor  treatment for MRSA infections. Test performance is not FDA approved in patients less than 108 years old. Performed at [facility], [address] [place], [place] [address]     Labs: CBC: Recent Labs  Lab 08/05/22 2020 08/06/22 0434  WBC 13.3* 12.3*  NEUTROABS 9.8*  --   HGB 14.3 13.1  HCT 43.5 39.0  MCV 90.8 89.2  PLT 315 0000000   Basic Metabolic Panel: Recent Labs  Lab 08/05/22 2020 08/06/22 0434  NA 135 137  K 3.7 3.4*  CL 97* 102  CO2 27 24  GLUCOSE 163* 118*  BUN 47* 41*  CREATININE 1.20 0.90  CALCIUM 8.7* 8.4*   Liver Function Tests: Recent Labs  Lab 08/05/22 2020 08/06/22 0434  AST 23 18  ALT 7 7  ALKPHOS 105 91  BILITOT 0.7 1.0  PROT 7.8 6.8  ALBUMIN 4.1 3.5   CBG: No results for input(s): "GLUCAP" in the last 168 hours.  Discharge time spent: greater than 30 minutes.  Signed: [name], MD [facility] 08/07/2022

## 2022-08-07 NOTE — Progress Notes (Signed)
Progress Note   Patient: Brandon Calhoun [id number] DOB: [birth year] DOA: 08/05/2022     2 DOS: the patient was seen and examined on 08/07/2022   Brief hospital course: NEESON SUSMAN is a 89 y.o. male with past medical history significant for hypertension, prediabetes (hba1c=6.0), parkinsons, gerd, asthma, apparently presents with c/o hypoxia.  , pox 80's, and rhonchi, requiring bipap in ED to maintain o2 sat >90%,  pt apparently very rhonchorous per ED, and thought to have aspirated.  Pt daughter thought that applesauce might have been the culprit.  Pt has slight cough, nonproductive, and dyspnea.  Pt denies fever, chills, chest pain, palpitations, abd pain, diarrhea, brbpr, black stool, dysuria, hematuria.  Pt has not had a prior formal speech evaluation in the past.  Pt apparently at baseline walks with walker about 1/2 mile per day, and is able to feed himself. No special dietary restrictions currently.    In ED, T 99, P 93  Bp 140/69  R 24, pox 70's per ED on RA, 97% on Bipap at FIo2 30%,    Pt was slowly weaned off bipap to 6L o2 [place] while in the ED.   Wbc 13.3, hgb 14.3, Plt 315 Na 135, K 3.7, Bun 47, Creat 1.2, Ast 23, Alt 7 Lactic acid 1.4, BNP 57.7   Trop 11-> 16   Respiratory panel negative Blood culture no growth > 12 hours   CXR No acute cardiopulmonary process.   Pt given meropenem, Rocephin, zithromax in ED    Pt will be admitted for acute respiratory failure with hypoxia secondary to aspiration.   02/19: Admitted for aspiration pneumonia  02/20: Patient is seen and examined at the bedside. Tolerating liquid diet. Complains of nasal congestion.  For discharge in a.m. back to [facility] with hospice   Assessment and Plan:  Acute Respiratory Failure with hypoxia  Patient presented to the ER and was tachypneic with increased work of breathing and room air pulse oximetry in the 70s.  He initially required BiPAP and has been weaned down from 6 L of oxygen to 2  L Acute respiratory failure appears to be secondary to suspected aspiration pneumonia We will attempt to wean patient off oxygen once acute illness improves or resolves.       Aspiration pneumonia Patient has dysphagia per daughter He presented to the ER for evaluation of shortness of breath and was noted to be hypoxic and had diffuse rhonchi on presentation as well as white count of 13.3 Appreciate speech therapy input, patient had a bedside swallow evaluation which showed findings concerning for oropharyngeal phase dysphagia with a moderate to severe aspiration risk. Recommend to keep patient n.p.o. except for administration of critical meds with pure Patient had a modified barium swallow and was noted to have severe-profound oropharyngeal dysphagia in the setting of Parkinson's disease and advanced age.  Speech therapy recommended alternative means of nutrition. Discussed the recommendation with patient's daughter and she and her father have opted for hospice and for him to have pleasure feeds as tolerated. Will discontinue Unasyn     Parkinson disease Continue sinemet     Asthma Not acutely exacerbated Cont Symbicort-> Dulera Cont Singulair 72m po qhs   Gerd Cont PPI   Chronic lower extremity venous stasis Continue Torsemide Continue Aldactone   Bph Continue Flomax   Anxiety Continue Celexa   Hypokalemia Supplement potassium            Physical Exam: Vitals:   08/06/22 1150 08/06/22 2218  08/07/22 0103 08/07/22 0715  BP: (!) 125/49 (!) 131/56 (!) 142/59 (!) 148/68  Pulse: 68 60 (!) 59 70  Resp: 18 18 17 16  $ Temp: (!) 97.5 F (36.4 C) 97.9 F (36.6 C) (!) 97.4 F (36.3 C) 97.8 F (36.6 C)  TempSrc:  Oral Oral Oral  SpO2: 96% 94% 96% 94%  Weight:      Height:       Physical Exam Vitals and nursing note reviewed.  HENT:     Head: Normocephalic and atraumatic.  Cardiovascular:     Rate and Rhythm: Normal rate and regular rhythm.  Pulmonary:      Effort: Pulmonary effort is normal.     Breath sounds: Examination of the right-upper field reveals rhonchi. Examination of the left-upper field reveals rhonchi. Examination of the right-middle field reveals rhonchi. Examination of the left-middle field reveals rhonchi. Examination of the right-lower field reveals rhonchi. Examination of the left-lower field reveals rhonchi. Rhonchi present.  Abdominal:     General: Bowel sounds are normal.     Palpations: Abdomen is soft.  Musculoskeletal:        General: Normal range of motion.     Cervical back: Normal range of motion and neck supple.  Skin:    General: Skin is warm and dry.  Neurological:     Mental Status: He is alert.     Motor: Weakness present.  Psychiatric:        Mood and Affect: Mood normal.        Behavior: Behavior normal.     Data Reviewed: There are no new results to review at this time.  Family Communication: Discussed with patient's daughter and he will be discharged [facility] with hospice  Disposition: Status is: Inpatient Remains inpatient appropriate because: Awaiting discharge in a.m. back to [facility] with hospice  Planned Discharge Destination:  Home with hospice    Time spent: 25 minutes  Author: [name], MD 08/07/2022 1:00 PM  For on call review [email or web address].

## 2022-08-07 NOTE — TOC Transition Note (Signed)
Transition of Care [facility]) - CM/SW Discharge Note   Patient Details  Name: Brandon Calhoun MRN: [id number] Date of Birth: 1929-07-04  Transition of [facility] CM/SW Contact:  Gerilyn Pilgrim, LCSW Phone Number: 08/07/2022, 2:51 PM   Clinical Narrative:  Pt has orders to discharge to [facility] with [facility] hospice. DC summary and FL2 sent. Medical necessity printed to unit. RN given number for report. [facility] notified. CSW will call ACMES once RN is ready. CSW signing off.     Final next level of care: [place] (with hospice) Barriers to Discharge: Barriers Resolved   Patient Goals and CMS Choice CMS Medicare.gov Compare Post Acute Care list provided to:: Patient Represenative (must comment) Choice offered to / list presented to : Patient, [place] / [place]  Discharge Placement                Patient chooses bed at: [facility] Patient to be transferred to facility by: [facility] Name of family member notified: susan taylor Patient and family notified of of transfer: 08/07/22  Discharge Plan and Services Additional resources added to the After Visit Summary for                                       Social Determinants of Health (SDOH) Interventions SDOH Screenings   Food Insecurity: No Food Insecurity (08/06/2022)  Housing: Low Risk  (08/06/2022)  Transportation Needs: No Transportation Needs (08/06/2022)  Utilities: Not At Risk (08/06/2022)  Tobacco Use: Low Risk  (08/05/2022)     Readmission Risk Interventions     No data to display

## 2022-08-07 NOTE — Progress Notes (Signed)
Patient being discharged to [facility] with [facility]. Called report to [place] at [facility]. All discharge paperwork is in discharge packet. Patient will be transport to [facility] via EMS.

## 2022-08-07 NOTE — Progress Notes (Addendum)
Speech Language Pathology Treatment: Dysphagia  Patient Details Name: Brandon Calhoun MRN: [id number] DOB: [birth year] Today's Date: 08/07/2022 Time: [id number] SLP Time Calculation (min) (ACUTE ONLY): 45 min  Assessment / Plan / Recommendation Clinical Impression  Pt seen today for dysphagia treatment and education on supportive swallowing activities/precautions. Pt alert, cooperative, and pleasant for duration of treatment. Pt sitting up in bed w/ drink in front of him upon SLP arrival. Daughter and son-in-law present during treatment. Pt left sitting up in bed w/ bed alarm set, call button in reach, and family present. Pt's vocal quality continues to be min weak, breathy, and hoarse. Noted mild articulation imprecision and reduced breath support, resulting in slow speech w/ frequent pauses to replenish breath. Suspect suspect in setting of impact of Parkinson's disease. Pt intelligible in majority of conversational speech. Intelligibility increased w/ reduced environmental distractions/noises.  Family reported pt had drank Gatorade and ate grits this morning as aligned w/ pt/family wishes for Comfort care/pleasure feeds at this time.  Pt on RA; afebrile; WBC slightly elevated.  Treatment focused on education of therapeutic swallowing activites and compensatory strategies/precautions. Oral care completed by pt prior to swallowing exercises. Pt did not appear interested in utilizing therapeutic activities at home c/b telling his daughter "you can do them." Daughter requested his cooperation for her education; pt actively listened and demonstrated effort to participate in practicing activities. Pt educated and modeled on Masako maneuver, mendelsohn maneuver, and effortful swallow exercise. Provided pt and daughter w/ educational handout to assist w/ discussion and carryover to next venue of care. Pt exhibited min/mod difficulty attempting Masako maneuver d/t tongue tremor and generalized weakness. Pt  given one small ice chip to moisten mouth and improve his ability to participate in activities. Discussed compensatory strategies/aspiration precautions (sitting upright, encouragement to cough as needed, small bites/sips) w/ pt and family. Pt/family expressed comprehension/appreciation of swallowing activities/strategies. Family asked pertinent f/u questions and expressed satisfaction w/ answers and POC moving onward to next venue of care. Handouts on activities left w/ pt/family.  Recommend f/u ST services at next venue of care as desired for further education w/ pt/family. Recommend f/u with MD/Neurology and palliative/Hospice care for any further education needed on the progressive nature of Parkinson's disease and its impact on swallowing. No further acute skilled SLP needs at this time. ST services will s/o. MD to reconsult if any new needs arise during this admission. Pt/family/RN updated and agreed.      HPI HPI: Per H&P, pt is a 89 y.o. male with medical history significant of hypertension, prediabetes (hga1c=6.0), parkinsons, gerd, asthma, apparently presents with c/o hypoxia.  , pox 80's, and rhonchi, requiring bipap in ED to maintain o2 sat >90%,  pt apparently very rhonchorous per ED, and thought to have aspirated.  Pt daughter thinks that applesauce might have been the culprit.  Pt has slight cough, nonproductive, and dyspnea.  Pt denies fever, chills, cp, palp, abd pain, diarrhea, brbpr, black stool, dysuria, hematuria.  Pt has not had a prior formal speech evaluation in the past.  Pt apparently at baseline walks with walker about 1/2 mile per day, and is able to feed himself. No special dietary restrictions currently.      SLP Plan  All goals met      Recommendations for follow up therapy are one component of a multi-disciplinary discharge planning process, led by the attending physician.  Recommendations may be updated based on patient status, additional functional criteria and  insurance authorization.    Recommendations  Diet recommendations: NPO Medication Administration: Via alternative means                General recommendations: Other(comment) (F/u at next venue of care as indicated) Oral Care Recommendations: Oral care QID;Patient independent with oral care;Oral care prior to ice chip/H20 (ice chip w/ swallowing exercise) Follow Up Recommendations: Follow physician's recommendations for discharge plan and follow up therapies Assistance recommended at discharge: Frequent or constant Supervision/Assistance SLP Visit Diagnosis: Dysphagia, pharyngeal phase (R13.13) Plan: All goals met           Brandon Calhoun Graduate Clinician Brandon Calhoun, Speech Pathology  Brandon Calhoun 08/07/2022, 12:12 PM    The information in this patient note, response to treatment, and overall treatment plan developed has been reviewed and agreed upon after reviewing documentation. This session was performed under the supervision of this licensed clinician.   Brandon Calhoun, [place], [place] [phone] 08/07/2022, 1658pm

## 2022-08-07 NOTE — Progress Notes (Incomplete)
Patient on comfort care measures. BP has been high throughout the morning and night.

## 2022-08-07 NOTE — NC FL2 (Addendum)
[place] LEVEL OF CARE FORM     IDENTIFICATION  Patient Name: Brandon Calhoun Birthdate: Oct 28, 1929 Sex: male Admission Date (Current Location): 08/05/2022  [facility] and [place] Number:  Engineering geologist and Address:  [facility], [address] [place], [place] [address]      Provider Number: [id number]  Attending Physician Name and Address:  [name], MD  Relative Name and Phone Number:  Doran Heater (Daughter) [phone]    Current Level of Care: Hospital Recommended Level of Care: [place] Prior Approval Number:    Date Approved/Denied:   PASRR Number:    Discharge Plan: SNF    Current Diagnoses: Patient Active Problem List   Diagnosis Date Noted   Acute respiratory failure with hypoxia ([place]) 08/05/2022   Localized swelling of left lower leg 06/30/2022   Goals of care, counseling/discussion 04/24/2022   Do not resuscitate 04/24/2022   Stage 2 skin ulcer of sacral region ([place]) 03/02/2022   Anxiety disorder, unspecified 02/14/2022   Anemia 09/28/2021   Cellulitis 05/30/2021   HTN (hypertension) 01/31/2020   Depression 01/31/2020   Multiple rib fractures 01/31/2020   Cellulitis of left lower leg 01/31/2020   Asthma 01/31/2020   Hyponatremia 10/29/2019   Leukocytosis 10/29/2019   Accidental fall 10/29/2019   Generalized weakness 10/29/2019   Venous insufficiency of both lower extremities 07/03/2018   Parkinson's disease 01/03/2017   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   GERD (gastroesophageal reflux disease)    Mood disorder (HCC)    Allergic rhinitis 03/08/2014    Orientation RESPIRATION BLADDER Height & Weight     Self, Place  Normal External catheter Weight: 143 lb 4.8 oz (65 kg) Height:  5' 9"$  (175.3 cm)  BEHAVIORAL SYMPTOMS/MOOD NEUROLOGICAL BOWEL NUTRITION STATUS      Incontinent  (comfort feeds)  AMBULATORY STATUS COMMUNICATION OF NEEDS Skin    Total Care Verbally  (Pressure injury on buttocks stage 2, skin tear on left arm)                       Personal Care Assistance Level of Assistance              Functional Limitations Info  Sight, Hearing, Speech Sight Info: Adequate Hearing Info: Adequate Speech Info: Adequate    SPECIAL CARE FACTORS FREQUENCY                       Contractures Contractures Info: Not present    Additional Factors Info  Code Status Code Status Info: DNR             Current Medications (08/07/2022):  This is the current hospital active medication list Current Facility-Administered Medications  Medication Dose Route Frequency Provider Last Rate Last Admin   acetaminophen (TYLENOL) tablet 650 mg  650 mg Oral Q6H PRN Jani Gravel, MD       Or   acetaminophen (TYLENOL) suppository 650 mg  650 mg Rectal Q6H PRN Jani Gravel, MD       carbidopa-levodopa (SINEMET CR) 50-200 MG per tablet controlled release 1 tablet  1 tablet Oral TID Carmelia Roller, MD   1 tablet at 08/07/22 1216   citalopram (CELEXA) tablet 10 mg  10 mg Oral Once per day on Sun Tue Wed Fri Sat Jani Gravel, MD   10 mg at 08/07/22 0836   enoxaparin (LOVENOX) injection 40 mg  40 mg Subcutaneous Q24H Jani Gravel,  MD   40 mg at 08/06/22 0005   fluticasone (FLONASE) 50 MCG/ACT nasal spray 2 spray  2 spray Each Nare Daily Jani Gravel, MD   2 spray at 08/07/22 [id number]   hydrALAZINE (APRESOLINE) injection 10 mg  10 mg Intravenous Q6H PRN Jani Gravel, MD       mometasone-formoterol [facility]) 200-5 MCG/ACT inhaler 2 puff  2 puff Inhalation BID Jani Gravel, MD   2 puff at 08/07/22 0838   montelukast (SINGULAIR) tablet 10 mg  10 mg Oral Loma Sousa, MD   10 mg at 08/06/22 2113   pantoprazole (PROTONIX) EC tablet 40 mg  40 mg Oral Daily Jani Gravel, MD   40 mg at 08/07/22 [id number]   sodium chloride (OCEAN) 0.65 % nasal spray 1 spray  1 spray Each Nare PRN Agbata, Tochukwu, MD       spironolactone (ALDACTONE) tablet 50 mg  50 mg Oral Daily Jani Gravel, MD   50 mg at 08/07/22 [id number]   tamsulosin (FLOMAX) capsule 0.4 mg  0.4 mg Oral Daily Wynelle Cleveland, RPH       torsemide [facility]) tablet 40 mg  40 mg Oral Daily Jani Gravel, MD   40 mg at 08/07/22 [id number]     Discharge Medications:  Medication List       STOP taking these medications     alum & mag hydroxide-simeth 200-200-20 MG/5ML suspension Commonly known as: MAALOX/MYLANTA    carbidopa-levodopa 50-200 MG tablet Commonly known as: SINEMET CR    cetirizine 10 MG tablet Commonly known as: ZYRTEC    furosemide 40 MG tablet Commonly known as: LASIX    magnesium hydroxide 400 MG/5ML suspension Commonly known as: MILK OF MAGNESIA    montelukast 10 MG tablet Commonly known as: SINGULAIR    omeprazole 20 MG capsule Commonly known as: PRILOSEC    PEPTO-BISMOL PO    spironolactone 50 MG tablet Commonly known as: ALDACTONE    tamsulosin 0.4 MG Caps capsule Commonly known as: FLOMAX    torsemide 20 MG tablet Commonly known as: DEMADEX           TAKE these medications     acetaminophen 325 MG tablet Commonly known as: TYLENOL Take 650 mg by mouth every 6 (six) hours as needed for mild pain.    budesonide-formoterol 160-4.5 MCG/ACT inhaler Commonly known as: SYMBICORT Inhale 2 puffs into the lungs 2 (two) times daily. Rinse mouth after use    citalopram 10 MG tablet Commonly known as: CELEXA Take 10 mg by mouth See admin instructions. Take one tablet (10 mg) once daily on Tuesday, Wednesday, Friday, Saturday and Sunday    Dermacloud Oint Apply 1 Application topically 3 (three) times daily.    fluticasone 50 MCG/ACT nasal spray Commonly known as: FLONASE Place 2 sprays into the nose daily.    OcuSoft Eyelid Cleansing Pads Apply 1 application topically at bedtime. For blepharitis for 90 days    PROSOURCE PO Twice daily for wound healing.    triamcinolone cream 0.1 % Commonly known as: KENALOG Apply 1 Application topically every 12 (twelve) hours  as needed. Apply to right thigh, L thigh, Abd topically two times a day for rash morning and evening. apply with petroleum jelly    ZINC OXIDE EX Apply 1 Application topically every 12 (twelve) hours as needed.        Relevant Imaging Results:  Relevant Lab Results:   Additional Information SS# [id number]  Gerilyn Pilgrim, LCSW

## 2022-08-07 NOTE — Consult Note (Signed)
Consultation Note Date: 08/07/2022   Patient Name: Brandon Calhoun  DOB: December 14, 1929  MRN: [id number]  Age / Sex: 89 y.o., male  PCP: Brandon Shorter, MD Referring Physician: [name], MD  Reason for Consultation: Establishing goals of care   HPI/Brief Hospital Course: 89 y.o. male  with past medical history of HTN, prediabetes, Parkinson's , GERD and asthma admitted from [facility] on 08/05/2022 with likely aspiration with associated hypoxia requiring bipap to maintain stable SpO2.   Evaluated by speech therapy this admission-bedside swallow evaluation concerning for oropharyngeal phase dysphagia with moderate to severe aspiration risk. Barium swallow study revealing severe-profound oropharyngeal dysphagia in the setting of advanced Parkinson's disease. Speech therapy recommending alternative means of nutrition.  Palliative medicine was consulted for assisting with goals of care conversations.  Subjective:  Extensive chart review has been completed prior to meeting patient including labs, vital signs, imaging, progress notes, orders, and available advanced directive documents from current and previous encounters.  Introduced myself as a Designer, jewellery as a member of the palliative care team. Explained palliative medicine is specialized medical care for people living with serious illness. It focuses on providing relief from the symptoms and stress of a serious illness. The goal is to improve quality of life for both the patient and the family.   Rounded earlier, son-in-law at bedside, requesting I return once Brandon Calhoun-daughter arrives later in the day-meeting time set for 12:30.  In the interim, TOC and Hospice Liaison coordinating care with daughter-elected hospice services on his return to [facility]. Decline further interventions such as feeding tube placement for ongoing aspiration risk.  Met back with Brandon Calhoun with Brandon Calhoun at his bedside.  Emotional support provided as Brandon Calhoun struggled to accept choices he had been provided.  Briefly provided education on long term feeding tubes in people with advanced Parkinson's disease. Brandon Calhoun voiced understanding and does not desire to have feeding tube placed. After further discussions, he agrees with accepting hospice services on his return to [facility]. Will touch base again with Hospice Liaison for updates and clarification.  At this time, Brandon Calhoun denies pain/discomfort, N/V.  All questions/concerns addressed. Emotional support provided to patient/family/support persons. PMT will continue to follow and support patient as needed.  Objective: Primary Diagnoses: Present on Admission:  Parkinson's disease  Acute respiratory failure with hypoxia (HCC)  GERD (gastroesophageal reflux disease)  BPH with obstruction/lower urinary tract symptoms  Leukocytosis  HTN (hypertension)  Asthma   Physical Exam Constitutional:      General: He is not in acute distress.    Appearance: He is ill-appearing.  Pulmonary:     Effort: Pulmonary effort is normal. No respiratory distress.  Abdominal:     General: Abdomen is flat.     Palpations: Abdomen is soft.  Skin:    General: Skin is warm and dry.  Neurological:     Mental Status: He is alert.     Vital Signs: BP (!) 148/68 (BP Location: Left Arm) Comment: Debbie RN notified, no need for recheck at this time due to comfort measures.  Pulse 70   Temp 97.8 F (36.6 C) (Oral)   Resp 16   Ht '5\' 9"'$  (1.753 m)   Wt 65 kg   SpO2 94%   BMI 21.16 kg/m  Pain Scale: 0-10   Pain Score: 0-No pain  Assessment and Plan  SUMMARY OF RECOMMENDATIONS   DNR Discharge back to [facility] with Hospice services Planned for discharge 2/21   Thank you  for this consult and allowing Palliative Medicine to participate in the care of Brandon Harper "Ted." Palliative medicine will continue to follow and assist as needed.   Time Total:  40 minutes  Signed by: Theodoro Grist, DNP, AGNP-C Palliative Medicine    Please contact Palliative Medicine Team phone at [phone] for questions and concerns.  For individual provider: See Shea Evans

## 2022-08-07 NOTE — TOC Initial Note (Signed)
Transition of Care [facility]) - Initial/Assessment Note    Patient Details  Name: Brandon Calhoun MRN: [id number] Date of Birth: 1929-06-26  Transition of Care [facility]) CM/SW Contact:    Gerilyn Pilgrim, LCSW Phone Number: 08/07/2022, 11:04 AM  Clinical Narrative:  CSW spoke with patients daughter regarding home hospice agencies. Daughter provided choice and daughter reports she would like to use [facility] for home hospice [facility] [place]. Message left with [facility] and [facility] notified.                  Expected Discharge Plan: Home w Hospice Care Barriers to Discharge: Continued Medical Work up   Patient Goals and CMS Choice   CMS Medicare.gov Compare Post Acute Care list provided to:: Patient Represenative (must comment) (daughter)        Expected Discharge Plan and Services                                              Prior Living Arrangements/Services     Patient language and need for interpreter reviewed:: Yes Do you feel safe going back to the place where you live?: Yes      Need for Family Participation in Patient Care: Yes (Comment) Care giver support system in place?: Yes (comment)   Criminal Activity/Legal Involvement Pertinent to Current Situation/Hospitalization: No - Comment as needed  Activities of Daily Living Home Assistive Devices/Equipment: Oxygen, Walker (specify type), Wheelchair ADL Screening (condition at time of admission) Patient's cognitive ability adequate to safely complete daily activities?: Yes Is the patient deaf or have difficulty hearing?: Yes Does the patient have difficulty seeing, even when wearing glasses/contacts?: No Does the patient have difficulty concentrating, remembering, or making decisions?: No Patient able to express need for assistance with ADLs?: Yes Does the patient have difficulty dressing or bathing?: Yes Independently performs ADLs?: No Communication: Needs assistance Dressing (OT): Needs  assistance Grooming: Needs assistance Bathing: Needs assistance Toileting: Needs assistance In/Out Bed: Needs assistance Walks in Home: Needs assistance Does the patient have difficulty walking or climbing stairs?: Yes Weakness of Legs: Both Weakness of Arms/Hands: Both  Permission Sought/Granted      Share Information with NAME: daughter  Permission granted to share info w AGENCY: [facility]        Emotional Assessment       Orientation: : Fluctuating Orientation (Suspected and/or reported Sund[employer])      Admission diagnosis:  Hypoxia [R09.02] Acute respiratory failure with hypoxia ([place]) [J96.01] Aspiration into airway, initial encounter [T17.908A] Patient Active Problem List   Diagnosis Date Noted   Acute respiratory failure with hypoxia ([place]) 08/05/2022   Localized swelling of left lower leg 06/30/2022   Goals of care, counseling/discussion 04/24/2022   Do not resuscitate 04/24/2022   Stage 2 skin ulcer of sacral region ([place]) 03/02/2022   Anxiety disorder, unspecified 02/14/2022   Anemia 09/28/2021   Cellulitis 05/30/2021   HTN (hypertension) 01/31/2020   Depression 01/31/2020   Multiple rib fractures 01/31/2020   Cellulitis of left lower leg 01/31/2020   Asthma 01/31/2020   Hyponatremia 10/29/2019   Leukocytosis 10/29/2019   Accidental fall 10/29/2019   Generalized weakness 10/29/2019   Venous insufficiency of both lower extremities 07/03/2018   Parkinson's disease 01/03/2017   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 [place] [address] Phone: [phone] Fax:  [phone]     Social Determinants of Health (SDOH) Social History: SDOH Screenings   Food Insecurity: No Food Insecurity (08/06/2022)  Housing: Low Risk  (08/06/2022)  Transportation Needs: No Transportation Needs (08/06/2022)  Utilities: Not At Risk (08/06/2022)  Tobacco Use: Low Risk  (08/05/2022)   SDOH Interventions: Food Insecurity Interventions: Intervention Not Indicated Housing Interventions: Intervention Not Indicated Transportation Interventions: Intervention Not Indicated Utilities Interventions: Intervention Not Indicated   Readmission Risk Interventions     No data to display

## 2022-08-07 NOTE — Progress Notes (Signed)
Manufacturing engineer [facility]) Nurse Liaison Note  New referral for hospice services at [facility] received from [employer], SW TOC at discharge from [facility].  Met with patient, his daughter, Manuela Schwartz and her husband at the bedside.  Education provided on [employer], services and philosophy.  Space given to digest information and ask questions.  All in agreement with hospice services and request services when he goes home.  Patient currently resides at [facility].  DME at [facility]:  hospital bed, [facility], shower chair, 2 wheeled walker, rollator. DME needs:  Hospice RN to assess at admission  Referral sent to referral intake.  Patient plans on discharging today.  Thank you for allowing participation in this patient's care. Folder given with numbers to call for follow up.  Dimas Aguas, RN Nurse Liaison [phone]

## 2022-08-08 ENCOUNTER — Encounter: Payer: Self-pay | Admitting: Student

## 2022-08-08 ENCOUNTER — Non-Acute Institutional Stay (SKILLED_NURSING_FACILITY): Admitting: Student

## 2022-08-08 DIAGNOSIS — L98429 Non-pressure chronic ulcer of back with unspecified severity: Secondary | ICD-10-CM | POA: Diagnosis not present

## 2022-08-08 DIAGNOSIS — T17908D Unspecified foreign body in respiratory tract, part unspecified causing other injury, subsequent encounter: Secondary | ICD-10-CM

## 2022-08-08 DIAGNOSIS — G20B2 Parkinson's disease with dyskinesia, with fluctuations: Secondary | ICD-10-CM | POA: Diagnosis not present

## 2022-08-08 DIAGNOSIS — I10 Essential (primary) hypertension: Secondary | ICD-10-CM

## 2022-08-08 DIAGNOSIS — E46 Unspecified protein-calorie malnutrition: Secondary | ICD-10-CM | POA: Diagnosis not present

## 2022-08-08 DIAGNOSIS — F39 Unspecified mood [affective] disorder: Secondary | ICD-10-CM | POA: Diagnosis not present

## 2022-08-08 DIAGNOSIS — E43 Unspecified severe protein-calorie malnutrition: Secondary | ICD-10-CM | POA: Insufficient documentation

## 2022-08-08 DIAGNOSIS — J690 Pneumonitis due to inhalation of food and vomit: Secondary | ICD-10-CM

## 2022-08-08 DIAGNOSIS — I872 Venous insufficiency (chronic) (peripheral): Secondary | ICD-10-CM

## 2022-08-08 DIAGNOSIS — R1312 Dysphagia, oropharyngeal phase: Secondary | ICD-10-CM

## 2022-08-08 NOTE — Progress Notes (Signed)
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 Continue celexa 10 mg daily for anxiety. Based on symptoms, will consider adding morphine and ativan. Previously had a most-form complete and will uphold. Patient remains DNR status.    Family/ staff Communication: Daughter, nursing  Labs/tests ordered: none  I spent 45 minutes for the care of this patient 15 minutes in care coordination, charte review and documentation, and 30 minutes face to face time.   VTomasa Rand MD, [facility] [phone]

## 2022-08-10 LAB — CULTURE, BLOOD (ROUTINE X 2)
Culture: NO GROWTH
Culture: NO GROWTH
Special Requests: ADEQUATE
Special Requests: ADEQUATE

## 2022-08-30 ENCOUNTER — Non-Acute Institutional Stay (SKILLED_NURSING_FACILITY): Payer: Medicare Other | Admitting: Nurse Practitioner

## 2022-08-30 ENCOUNTER — Encounter: Payer: Self-pay | Admitting: Nurse Practitioner

## 2022-08-30 DIAGNOSIS — E46 Unspecified protein-calorie malnutrition: Secondary | ICD-10-CM

## 2022-08-30 DIAGNOSIS — I10 Essential (primary) hypertension: Secondary | ICD-10-CM | POA: Diagnosis not present

## 2022-08-30 DIAGNOSIS — R1312 Dysphagia, oropharyngeal phase: Secondary | ICD-10-CM

## 2022-08-30 DIAGNOSIS — G20B2 Parkinson's disease with dyskinesia, with fluctuations: Secondary | ICD-10-CM

## 2022-08-30 NOTE — Progress Notes (Signed)
Location:  Other [place].  Nursing Home Room Number: [place] of Service:  SNF [id number]) Provider:  Sherrie Mustache, NP  PCP: Dewayne Shorter, MD  Patient Care Team: Dewayne Shorter, MD as PCP - General [facility] Medicine)  Extended Emergency Contact Information Primary Emergency Contact: Brandon Calhoun Address: [address]          [place], [place] [address] Brandon Calhoun of [place] Phone: [phone] Mobile Phone: [phone] Relation: Daughter  Code Status:  DNR Goals of care: Advanced Directive information    08/30/2022    2:11 PM  Advanced Directives  Does Patient Have a Medical Advance Directive? Yes  Type of Advance Directive Out of facility DNR (pink MOST or yellow form)  Does patient want to make changes to medical advance directive? No - Patient declined     Chief Complaint  Patient presents with   Medical Management of Chronic Issues    Medical Management of Chronic Issues.     HPI:  Pt is a 89 y.o. male seen today for medical management of chronic diseases.   Pt with hx of htn, parkinsons disease, GERD, asthma. Went to the hospital with acute respiratory failure with hypoxia due to aspiration Pneumonia. He had a hx of aspiration due to parkinson's disease and was found to be a severe aspiration risk. He was treated and sent back to [facility] on hospice. Pt is currently doing very well. Eating with a modified diet.  Weights have been stable.  Pt is walking hallways with rollator. Denies any new pains at this time.  Denies anxiety or depression.   Past Medical History:  Diagnosis Date   Asthma    Edema    MILD OF FEET   GERD (gastroesophageal reflux disease)    Hypertension    Mood disorder (HCC)    chronic anxiety   Past Surgical History:  Procedure Laterality Date   CATARACT EXTRACTION W/PHACO Left 02/16/2016   Procedure: CATARACT EXTRACTION PHACO AND INTRAOCULAR LENS PLACEMENT ([place]);  Surgeon: Eulogio Bear, MD;  Location:  [facility] ORS;  Service: Ophthalmology;  Laterality: Left;  [place] 1.10 AP% 10.6 CDE 7.41 Fluid Pack Lot # [id number]   CATARACT EXTRACTION W/PHACO Right 03/08/2016   Procedure: CATARACT EXTRACTION PHACO AND INTRAOCULAR LENS PLACEMENT ([place]);  Surgeon: Eulogio Bear, MD;  Location: [facility] ORS;  Service: Ophthalmology;  Laterality: Right;  [place] 1.14 AP% 11.6 CDE 11.80 Fluid Pack Lot # [id number]   HERNIA REPAIR     HIP ARTHROPLASTY Left 07/06/2016   Procedure: ARTHROPLASTY BIPOLAR HIP (HEMIARTHROPLASTY);  Surgeon: Hessie Knows, MD;  Location: [facility] ORS;  Service: Orthopedics;  Laterality: Left;    Allergies  Allergen Reactions   Doxycycline     Outpatient Encounter Medications as of 08/30/2022  Medication Sig   acetaminophen (TYLENOL) 325 MG tablet Take 650 mg by mouth every 6 (six) hours as needed for mild pain.   alum & mag hydroxide-simeth (MAALOX PLUS) 400-400-40 MG/5ML suspension Take 30 mLs by mouth every 6 (six) hours as needed for indigestion.   bismuth subsalicylate (PEPTO BISMOL) 262 MG/15ML suspension Take 30 mLs by mouth every 4 (four) hours as needed.   budesonide-formoterol (SYMBICORT) 160-4.5 MCG/ACT inhaler Inhale 2 puffs into the lungs 2 (two) times daily. Rinse mouth after use   carbidopa-levodopa (SINEMET CR) 50-200 MG tablet Take 1 tablet by mouth 3 (three) times daily with meals.   cetirizine (ZYRTEC) 10 MG tablet Take 10 mg by mouth daily.   citalopram (CELEXA) 10  MG tablet Take 10 mg by mouth See admin instructions. Take one tablet (10 mg) once daily on Tuesday, Wednesday, Friday, Saturday and Sunday   Eyelid Cleansers (OCUSOFT EYELID CLEANSING) PADS Apply 1 application topically at bedtime. For blepharitis for 90 days   fluticasone (FLONASE) 50 MCG/ACT nasal spray Place 2 sprays into the nose daily.   Infant Care Products (DERMACLOUD) OINT Apply 1 Application topically 3 (three) times daily.   magnesium hydroxide (MILK OF MAGNESIA) 400 MG/5ML suspension Take 30 mLs by mouth daily  as needed for mild constipation.   montelukast (SINGULAIR) 10 MG tablet Take 10 mg by mouth at bedtime.   OXYGEN 2lmp as needed for dyspnea.   prazosin (MINIPRESS) 2 MG capsule Take 2 mg by mouth at bedtime.   Protein (PROSOURCE PO) Twice daily for wound healing.   spironolactone (ALDACTONE) 50 MG tablet Take 50 mg by mouth daily.   torsemide (DEMADEX) 20 MG tablet Take 40 mg by mouth daily.   triamcinolone cream (KENALOG) 0.1 % Apply 1 Application topically every 12 (twelve) hours as needed. Apply to right thigh, L thigh, Abd topically two times a day for rash morning and evening. apply with petroleum jelly   ZINC OXIDE EX Apply 1 Application topically every 12 (twelve) hours as needed.   [DISCONTINUED] omeprazole (PRILOSEC) 20 MG capsule Take 20 mg by mouth daily.   [DISCONTINUED] tamsulosin (FLOMAX) 0.4 MG CAPS capsule Take 0.4 mg by mouth daily after breakfast.   No facility-administered encounter medications on file as of 08/30/2022.    Review of Systems  Constitutional:  Negative for activity change, appetite change, fatigue and unexpected weight change.  HENT:  Negative for congestion and hearing loss.   Eyes: Negative.   Respiratory:  Negative for cough and shortness of breath.   Cardiovascular:  Negative for chest pain, palpitations and leg swelling.  Gastrointestinal:  Negative for abdominal pain, constipation and diarrhea.  Genitourinary:  Negative for difficulty urinating and dysuria.  Musculoskeletal:  Negative for arthralgias and myalgias.  Skin:  Negative for color change and wound.  Neurological:  Negative for dizziness and weakness.  Psychiatric/Behavioral:  Negative for agitation, behavioral problems and confusion.     Immunization History  Administered Date(s) Administered   Influenza Inj Mdck Quad Pf 03/16/2016   Influenza, High Dose Seasonal PF 03/09/2014, 03/08/2015, 04/03/2022   Influenza-Unspecified 03/08/2015   Moderna Covid-19 Vaccine Bivalent Booster 18yrs  & up 11/14/2021   Moderna SARS-COV2 Booster Vaccination 11/04/2020   Moderna Sars-Covid-2 Vaccination 06/29/2019, 07/27/2019, 04/29/2020   Pfizer Covid-19 Vaccine Bivalent Booster 12yrs & up 03/10/2021, 04/27/2022   Tdap 03/09/2014   Pertinent  Health Maintenance Due  Topic Date Due   INFLUENZA VACCINE  Completed      12 /14/2022    8:00 AM 05/31/2021    9:29 PM 06/01/2021   10:24 AM 09/28/2021    4:57 PM 09/29/2021    4:45 AM  Fall Risk  (RETIRED) Patient Fall Risk Level High fall risk Moderate fall risk Moderate fall risk Moderate fall risk Low fall risk   Functional Status Survey:    Vitals:   08/30/22 1403  BP: (!) 102/49  Pulse: 65  Resp: 20  Temp: 98 F (36.7 C)  SpO2: 90%  Weight: 145 lb 12.8 oz (66.1 kg)  Height: 5\' 9"  (1.753 m)   Body mass index is 21.53 kg/m. Physical Exam Constitutional:      General: He is not in acute distress.    Appearance: He is well-developed. He is  not diaphoretic.  HENT:     Head: Normocephalic and atraumatic.     Right Ear: External ear normal.     Left Ear: External ear normal.     Mouth/Throat:     Pharynx: No oropharyngeal exudate.  Eyes:     Conjunctiva/sclera: Conjunctivae normal.     Pupils: Pupils are equal, round, and reactive to light.  Cardiovascular:     Rate and Rhythm: Normal rate and regular rhythm.     Heart sounds: Normal heart sounds.  Pulmonary:     Effort: Pulmonary effort is normal.     Breath sounds: Normal breath sounds.  Abdominal:     General: Bowel sounds are normal.     Palpations: Abdomen is soft.  Musculoskeletal:        General: No tenderness.     Cervical back: Normal range of motion and neck supple.     Right lower leg: No edema.     Left lower leg: No edema.  Skin:    General: Skin is warm and dry.  Neurological:     Mental Status: He is alert.  Psychiatric:        Mood and Affect: Mood normal.     Labs reviewed: Recent Labs    09/29/21 0506 10/09/21 0000 07/05/22 0000  08/05/22 2020 08/06/22 0434  NA 136   < > 135* 135 137  K 3.9   < > 4.6 3.7 3.4*  CL 103   < > 99 97* 102  CO2 27   < > 24* 27 24  GLUCOSE 108*  --   --  163* 118*  BUN 27*   < > 36* 47* 41*  CREATININE 0.84   < > 1.0 1.20 0.90  CALCIUM 8.2*   < > 8.6* 8.7* 8.4*   < > = values in this interval not displayed.   Recent Labs    04/19/22 0000 08/05/22 2020 08/06/22 0434  AST 19 23 18   ALT 16 7 7   ALKPHOS 94 105 91  BILITOT  --  0.7 1.0  PROT  --  7.8 6.8  ALBUMIN 4.1 4.1 3.5   Recent Labs    09/28/21 1650 09/29/21 0506 10/16/21 0000 04/19/22 0000 06/30/22 0000 07/05/22 0000 08/05/22 2020 08/06/22 0434  WBC 8.2 8.1   < > 7.9   < > 9.2 13.3* 12.3*  NEUTROABS 5.0  --   --  5,601.00  --   --  9.8*  --   HGB 12.8* 13.1   < > 15.0   < > 14.1 14.3 13.1  HCT 38.5* 38.9*   < > 43   < > 41 43.5 39.0  MCV 89.7 90.0  --   --   --   --  90.8 89.2  PLT 259 218   < > 319   < > 309 315 258   < > = values in this interval not displayed.   Lab Results  Component Value Date   TSH 1.957 02/01/2020   Lab Results  Component Value Date   HGBA1C 6.0 (H) 05/31/2021   Lab Results  Component Value Date   CHOL 165 10/31/2019   HDL 46 10/31/2019   LDLCALC 111 (H) 10/31/2019   TRIG 40 10/31/2019   CHOLHDL 3.6 10/31/2019    Significant Diagnostic Results in last 30 days:  DG Swallowing Func-Speech Pathology  Result Date: 08/06/2022 Calla Kicks     08/06/2022  1:41 PM Modified Barium Swallow Study Patient  Details Name: JASCHA BENCE MRN: [id number] Date of Birth: Jul 22, 1929 Today's Date: 08/06/2022 HPI/PMH: HPI: Per H&P, pt is a 89 y.o. male with medical history significant of hypertension, prediabetes (hga1c=6.0), parkinsons, gerd, asthma, apparently presents with c/o hypoxia.  , pox 80's, and rhonchi, requiring bipap in ED to maintain o2 sat >90%,  pt apparently very rhonchorous per ED, and thought to have aspirated.  Pt daughter thinks that applesauce might have been  the culprit.  Pt has slight cough, nonproductive, and dyspnea.  Pt denies fever, chills, cp, palp, abd pain, diarrhea, brbpr, black stool, dysuria, hematuria.  Pt has not had a prior formal speech evaluation in the past.  Pt apparently at baseline walks with walker about 1/2 mile per day, and is able to feed himself. No special dietary restrictions currently. Clinical Impression: Clinical Impression: Pt presents with a severe-profound oropharyngeal dysphagia. Suspect pt's dysphagia is chronic in the setting of Parkinson's Disease and advanced age. Oral swallow is notable for oral residual which is increased with viscosity as well as repetitive/disorganized lingual motions during A-P transit. Pharyngeal swallow is notable for moderate-severe pharyngeal stasis which is increased with viscosity, before the swallow silent aspiration of thin liquids, during the swallow silent aspiration of thin liquids, during the swallow laryngeal penetration with nectar-thick liquids and pureed, and after swallow silent aspiration of honey thick liquids. After the swallow aspiration was resultant of pharyngeal stasis. Suspect pt is at risk for post-swallow aspiration of pharyngeal stasis regardless of consistency. Cued secondary swallows were limited in effectiveness in reducing pharyngeal stasis. Cued cough did not clear penetration or aspiration. Above mention deficits attributed to: decreased lingual strength and coordination, reduced base of tongue retraction, reduced hyolaryngeal elevation/excursion, reduced/mistimed laryngeal vestibule closure, reduced pharyngeal stripping wave, reduced duration of UES opening, and reduced laryngeal and pharyngeal sensation. At present, a safe oral diet cannot be recommended. Recommend NPO with Palliative Care Consult to discuss [place]. Pt's daughter and pt educated at length immediately following MBSS re: results of MBSS, rationale for current recommendations, changes to swallow function in pt's  with Parkinson's Disease, risks associated with aspiration/aspiration PNA, and SLP POC. Pt and daughter shown videofluoroscopic images for education. Pt and daughter asked pertinent questions and verbalized satisfaction with answers provided by SLP. Pt's daughter noted that pt not agreeable to NG or PEG tube. MD made aware. Pt's daughter requesting dysphagia ex's to help maintain any preserved swallow function. SLP to f/u per POC for dysphagia tx. RN and MD made aware of results, recommendations, and SLP POC. Factors that may increase risk of adverse event in presence of aspiration ([place] 2021): Factors that may increase risk of adverse event in presence of aspiration ([place] 2021): Frail or deconditioned; Aspiration of thick, dense, and/or acidic materials; Frequent aspiration of large volumes; Limited mobility; Poor general health and/or compromised immunity; Respiratory or GI disease Recommendations/Plan: Swallowing Evaluation Recommendations Swallowing Evaluation Recommendations Recommendations: NPO Medication Administration: Via alternative means Oral care recommendations: Oral care QID (4x/day); Staff/trained caregiver to provide oral care Recommended consults: Consider Palliative care Caregiver Recommendations: Remove water pitcher Treatment Plan Treatment Plan Treatment recommendations: Therapy as outlined in treatment plan below Follow-up recommendations: Skilled nursing-short term rehab (<3 hours/day) Functional status assessment: Patient has had a recent decline in their functional status and demonstrates the ability to make significant improvements in function in a reasonable and predictable amount of time. Treatment frequency: Min 2x/week Treatment duration: 1 week (trial of therapy while pt in house) Interventions: Oropharyngeal exercises;  Patient/family education Recommendations Recommendations for follow up therapy are one component of a multi-disciplinary discharge planning  process, led by the attending physician.  Recommendations may be updated based on patient status, additional functional criteria and insurance authorization. Assessment: Orofacial Exam: Orofacial Exam Oral Cavity: Oral Hygiene: WFL Oral Cavity - Dentition: Adequate natural dentition Orofacial Anatomy: WFL Oral Motor/Sensory Function: Generalized oral weakness Anatomy: Anatomy: WFL (lordosis) Thin Liquids: Thin Liquids (Level 0) Thin Liquids : Impaired Bolus delivery method: Spoon; Cup Thin Liquid - Impairment: Oral Impairment; Pharyngeal impairment Lip Closure: Interlabial escape, no progression to anterior lip Tongue control during bolus hold: Posterior escape of greater than half of bolus Bolus transport/lingual motion: Repetitive/disorganized tongue motion Oral residue: Residue collection on oral structures Location of oral residue : Tongue; Floor of mouth Initiation of swallow : Pyriform sinuses Soft palate elevation: No bolus between soft palate (SP)/pharyngeal wall (PW) Anterior hyoid excursion: Partial Epiglottic movement: Partial Laryngeal vestibule closure: Incomplete, narrow column air/contrast in laryngeal vestibule Pharyngeal stripping wave : Present - diminished Pharyngeal contraction (A/P view only): N/A Pharyngoesophageal segment opening: Partial distention/partial duration, partial obstruction of flow Tongue base retraction: Wide column of contrast or air between tongue base and PPW Pharyngeal residue: Collection of residue within or on pharyngeal structures Location of pharyngeal residue: Diffuse (>3 areas) Penetration/Aspiration Scale (PAS) score: 8.  Material enters airway, passes BELOW cords without attempt by patient to eject out (silent aspiration)  Mildly Thick Liquids: Mildly thick liquids (Level 2, nectar thick) Mildly thick liquids (Level 2, nectar thick): Impaired Bolus delivery method: Spoon; Cup Mildly Thick Liquid - Impairment: Oral Impairment; Pharyngeal impairment Lip Closure:  Interlabial escape, no progression to anterior lip Tongue control during bolus hold: Not tested Bolus transport/lingual motion: Repetitive/disorganized tongue motion Oral residue: Residue collection on oral structures Location of oral residue : Floor of mouth; Tongue; Palate; Lateral sulci Initiation of swallow : Pyriform sinuses Soft palate elevation: No bolus between soft palate (SP)/pharyngeal wall (PW) Laryngeal elevation: Partial superior movement of thyroid cartilage/partial approximation of arytenoids to epiglottic petiole Anterior hyoid excursion: Partial Epiglottic movement: Complete Laryngeal vestibule closure: Incomplete, narrow column air/contrast in laryngeal vestibule Pharyngeal stripping wave : Present - diminished Pharyngeal contraction (A/P view only): N/A Pharyngoesophageal segment opening: Partial distention/partial duration, partial obstruction of flow Tongue base retraction: Wide column of contrast or air between tongue base and PPW Pharyngeal residue: Collection of residue within or on pharyngeal structures Location of pharyngeal residue: Diffuse (>3 areas) Penetration/Aspiration Scale (PAS) score: 5.  Material enters airway, CONTACTS cords and not ejected out (during and after the swallow)  Moderately Thick Liquids: Moderately thick liquids (Level 3, honey thick) Moderately thick liquids (Level 3, honey thick): Impaired Bolus delivery method: Spoon Moderately Thick Liquid - Impairment: Oral Impairment; Pharyngeal impairment Lip Closure: Interlabial escape, no progression to anterior lip Tongue control during bolus hold: Not tested Bolus transport/lingual motion: Repetitive/disorganized tongue motion Oral residue: Majority of bolus remaining Location of oral residue : Tongue; Palate Initiation of swallow : Pyriform sinuses Soft palate elevation: No bolus between soft palate (SP)/pharyngeal wall (PW) Laryngeal elevation: Partial superior movement of thyroid cartilage/partial approximation of  arytenoids to epiglottic petiole Anterior hyoid excursion: Partial Epiglottic movement: Complete Laryngeal vestibule closure: Incomplete, narrow column air/contrast in laryngeal vestibule Pharyngeal stripping wave : Present - diminished Pharyngeal contraction (A/P view only): N/A Pharyngoesophageal segment opening: Partial distention/partial duration, partial obstruction of flow Tongue base retraction: Wide column of contrast or air between tongue base and PPW Pharyngeal residue: Majority of contrast within  or on pharyngeal structures Location of pharyngeal residue: Diffuse (>3 areas) Penetration/Aspiration Scale (PAS) score: 8.  Material enters airway, passes BELOW cords without attempt by patient to eject out (silent aspiration); 4.  Material enters airway, CONTACTS cords then ejected out (penetration during; aspiration after the swallow)  Puree: Puree Puree: Impaired Puree - Impairment: Oral Impairment; Pharyngeal impairment Lip Closure: Interlabial escape, no progression to anterior lip Bolus transport/lingual motion: Repetitive/disorganized tongue motion Oral residue: Majority of bolus remaining Location of oral residue : Tongue; Palate Initiation of swallow: Pyriform sinuses Soft palate elevation: No bolus between soft palate (SP)/pharyngeal wall (PW) Laryngeal elevation: Partial superior movement of thyroid cartilage/partial approximation of arytenoids to epiglottic petiole Anterior hyoid excursion: Partial Epiglottic movement: Complete Laryngeal vestibule closure: Incomplete, narrow column air/contrast in laryngeal vestibule Pharyngeal stripping wave : Present - diminished Pharyngeal contraction (A/P view only): N/A Pharyngoesophageal segment opening: Partial distention/partial duration, partial obstruction of flow Tongue base retraction: Wide column of contrast or air between tongue base and PPW Pharyngeal residue: Majority of contrast within or on pharyngeal structures Location of pharyngeal residue:  Diffuse (>3 areas) Penetration/Aspiration Scale (PAS) score: 4.  Material enters airway, CONTACTS cords then ejected out Solid: Solid Solid: Not Tested Pill: Pill Pill: Not Tested Compensatory Strategies: No data recorded  General Information: Caregiver present: Yes (daughter)  Diet Prior to this Study: NPO   No data recorded  No data recorded  Supplemental O2: Nasal cannula (2L/min)   History of Recent Intubation: No  Behavior/Cognition: Alert; Cooperative; Pleasant mood Self-Feeding Abilities: Dependent for feeding Baseline vocal quality/speech: Aphonic; Dysphonic; Hypophonia/low volume Volitional Cough: Able to elicit Volitional Swallow: Able to elicit No data recorded Goal Planning: Prognosis for improved oropharyngeal function: Guarded Barriers to Reach Goals: Overall medical prognosis; Severity of deficits No data recorded Patient/Family Stated Goal: To eat/drink safely and comfortably Consulted and agree with results and recommendations: Patient; Family member/caregiver; Physician; Nurse Pain: No data recorded End of Session: Start Time:SLP Start Time (ACUTE ONLY): 1205 Stop Time: SLP Stop Time (ACUTE ONLY): 1255 Time Calculation:SLP Time Calculation (min) (ACUTE ONLY): 50 min Charges: SLP Evaluations $ SLP Speech Visit: 1 Visit [id number]) SLP Evaluations $BSS Swallow: 1 Procedure $MBS Swallow: 1 Procedure $Swallowing Treatment: 1 Procedure SLP visit diagnosis: SLP Visit Diagnosis: Dysphagia, oropharyngeal phase (R13.12) Past Medical History: Past Medical History: Diagnosis Date  Asthma   Edema   MILD OF FEET  GERD (gastroesophageal reflux disease)   Hypertension   Mood disorder ([place])   chronic anxiety Past Surgical History: Past Surgical History: Procedure Laterality Date  CATARACT EXTRACTION W/PHACO Left 02/16/2016  Procedure: CATARACT EXTRACTION PHACO AND INTRAOCULAR LENS PLACEMENT ([place]);  Surgeon: Eulogio Bear, MD;  Location: [facility] ORS;  Service: Ophthalmology;  Laterality: Left;  [place] 1.10 AP% 10.6 CDE  7.41 Fluid Pack Lot # [id number]  CATARACT EXTRACTION W/PHACO Right 03/08/2016  Procedure: CATARACT EXTRACTION PHACO AND INTRAOCULAR LENS PLACEMENT ([place]);  Surgeon: Eulogio Bear, MD;  Location: [facility] ORS;  Service: Ophthalmology;  Laterality: Right;  [place] 1.14 AP% 11.6 CDE 11.80 Fluid Pack Lot # [id number]  HERNIA REPAIR    HIP ARTHROPLASTY Left 07/06/2016  Procedure: ARTHROPLASTY BIPOLAR HIP (HEMIARTHROPLASTY);  Surgeon: Hessie Knows, MD;  Location: [facility] ORS;  Service: Orthopedics;  Laterality: Left; Cherrie Gauze, M.S., [facility] [phone] [place]) Quintella Baton 08/06/2022, 1:37 PM   DG Chest Portable 1 View  Result Date: 08/05/2022 CLINICAL DATA:  [place] EXAM: PORTABLE CHEST 1 VIEW COMPARISON:  01/31/2020 FINDINGS: Normal mediastinum and cardiac silhouette. Normal pulmonary vasculature. No evidence of effusion, infiltrate, or pneumothorax. No acute bony abnormality. 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 IMPRESSION: No acute cardiopulmonary process. Electronically Signed   By: Suzy Bouchard M.D.   On: 08/05/2022 20:44    Assessment/Plan 1. Parkinson's disease with dyskinesia and fluctuating manifestations -stable, continues on sinemet CR TID.   2. 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 To have protein supplement in addition to smallest meal of the day.  3. Primary hypertension -Blood pressure well controlled, goal bp <140/90 Continue current medications and dietary modifications follow metabolic panel  4. Oropharyngeal dysphagia -continues with comfort feeds doing well at this time. Continues on hospice.   Carlos American. [place], [facility] [phone]

## 2022-09-06 ENCOUNTER — Non-Acute Institutional Stay (SKILLED_NURSING_FACILITY): Payer: Medicare Other | Admitting: Nurse Practitioner

## 2022-09-06 ENCOUNTER — Encounter: Payer: Self-pay | Admitting: Nurse Practitioner

## 2022-09-06 DIAGNOSIS — L989 Disorder of the skin and subcutaneous tissue, unspecified: Secondary | ICD-10-CM

## 2022-09-06 NOTE — Progress Notes (Signed)
Location:  Other [facility].  Nursing Home Room Number: [place] of Service:  SNF [phone]) Provider:  Sherrie Mustache, NP  PCP: Dewayne Shorter, MD  Patient Care Team: Dewayne Shorter, MD as PCP - General [facility])  Extended Emergency Contact Information Primary Emergency Contact: Doran Heater Address: [address]          [place], [place] [address] Johnnette Litter of [place] Phone: [phone] Mobile Phone: [phone] Relation: Daughter  Code Status:  DNR Goals of care: Advanced Directive information    09/06/2022   11:52 AM  Advanced Directives  Does Patient Have a Medical Advance Directive? Yes  Type of Advance Directive Out of facility DNR (pink MOST or yellow form)  Does patient want to make changes to medical advance directive? No - Patient declined     Chief Complaint  Patient presents with   Acute Visit    Skin Concerns    HPI:  Pt is a 89 y.o. male seen today for an acute visit for skin concern.  Pt has had area on nose that scabs and then comes off but does not heal. Area is not bleeding, no redness or discharge noted.  Does not cause pain or discomfort.  He is currently on hospice services due to advance parkinson's disease with dysphagia    Past Medical History:  Diagnosis Date   Asthma    Edema    MILD OF FEET   GERD (gastroesophageal reflux disease)    Hypertension    Mood disorder ([place])    chronic anxiety   Past Surgical History:  Procedure Laterality Date   CATARACT EXTRACTION W/PHACO Left 02/16/2016   Procedure: CATARACT EXTRACTION PHACO AND INTRAOCULAR LENS PLACEMENT ([place]);  Surgeon: Eulogio Bear, MD;  Location: [facility] ORS;  Service: Ophthalmology;  Laterality: Left;  [place] 1.10 AP% 10.6 CDE 7.41 Fluid Pack Lot # [id number]   CATARACT EXTRACTION W/PHACO Right 03/08/2016   Procedure: CATARACT EXTRACTION PHACO AND INTRAOCULAR LENS PLACEMENT ([place]);  Surgeon: Eulogio Bear, MD;  Location: [facility] ORS;  Service:  Ophthalmology;  Laterality: Right;  [place] 1.14 AP% 11.6 CDE 11.80 Fluid Pack Lot # [id number]   HERNIA REPAIR     HIP ARTHROPLASTY Left 07/06/2016   Procedure: ARTHROPLASTY BIPOLAR HIP (HEMIARTHROPLASTY);  Surgeon: Hessie Knows, MD;  Location: [facility] ORS;  Service: Orthopedics;  Laterality: Left;    Allergies  Allergen Reactions   Doxycycline     Outpatient Encounter Medications as of 09/06/2022  Medication Sig   acetaminophen (TYLENOL) 325 MG tablet Take 650 mg by mouth every 6 (six) hours as needed for mild pain.   alum & mag hydroxide-simeth (MAALOX PLUS) 400-400-40 MG/5ML suspension Take 30 mLs by mouth every 6 (six) hours as needed for indigestion.   bismuth subsalicylate (PEPTO BISMOL) 262 MG/15ML suspension Take 30 mLs by mouth every 4 (four) hours as needed.   budesonide-formoterol (SYMBICORT) 160-4.5 MCG/ACT inhaler Inhale 2 puffs into the lungs 2 (two) times daily. Rinse mouth after use   carbidopa-levodopa (SINEMET CR) 50-200 MG tablet Take 1 tablet by mouth 3 (three) times daily with meals.   cetirizine (ZYRTEC) 10 MG tablet Take 10 mg by mouth daily.   citalopram (CELEXA) 10 MG tablet Take 10 mg by mouth See admin instructions. Take one tablet (10 mg) once daily on Tuesday, Wednesday, Friday, Saturday and Sunday   Eyelid Cleansers (OCUSOFT EYELID CLEANSING) PADS Apply 1 application topically at bedtime. For blepharitis for 90 days   fluticasone (FLONASE) 50 MCG/ACT  nasal spray Place 2 sprays into the nose daily.   Infant Care Products (DERMACLOUD) OINT Apply 1 Application topically 3 (three) times daily.   magnesium hydroxide (MILK OF MAGNESIA) 400 MG/5ML suspension Take 30 mLs by mouth daily as needed for mild constipation.   montelukast (SINGULAIR) 10 MG tablet Take 10 mg by mouth at bedtime.   OXYGEN 2lmp as needed for dyspnea.   prazosin (MINIPRESS) 2 MG capsule Take 2 mg by mouth at bedtime.   Protein (PROSOURCE PO) Twice daily for wound healing.   spironolactone (ALDACTONE)  50 MG tablet Take 50 mg by mouth daily.   torsemide (DEMADEX) 20 MG tablet Take 40 mg by mouth daily.   triamcinolone cream (KENALOG) 0.1 % Apply 1 Application topically every 12 (twelve) hours as needed. Apply to right thigh, L thigh, Abd topically two times a day for rash morning and evening. apply with petroleum jelly   ZINC OXIDE EX Apply 1 Application topically every 12 (twelve) hours as needed.   No facility-administered encounter medications on file as of 09/06/2022.    Review of Systems  Skin:        Skin lesion not healing    Immunization History  Administered Date(s) Administered   Influenza Inj Mdck Quad Pf 03/16/2016   Influenza, High Dose Seasonal PF 03/09/2014, 03/08/2015, 04/03/2022   Influenza-Unspecified 03/08/2015   Moderna Covid-19 Vaccine Bivalent Booster 53yrs & up 11/14/2021   Moderna SARS-COV2 Booster Vaccination 11/04/2020   Moderna Sars-Covid-2 Vaccination 06/29/2019, 07/27/2019, 04/29/2020   Pfizer Covid-19 Vaccine Bivalent Booster 72yrs & up 03/10/2021, 04/27/2022   Tdap 03/09/2014   Pertinent  Health Maintenance Due  Topic Date Due   INFLUENZA VACCINE  Completed      05/31/2021    8:00 AM 05/31/2021    9:29 PM 06/01/2021   10:24 AM 09/28/2021    4:57 PM 09/29/2021    4:45 AM  Fall Risk  (RETIRED) Patient Fall Risk Level High fall risk Moderate fall risk Moderate fall risk Moderate fall risk Low fall risk   Functional Status Survey:    Vitals:   09/06/22 1145  BP: (!) 106/59  Pulse: 70  Resp: 16  Temp: (!) 96.9 F (36.1 C)  SpO2: 90%  Weight: 145 lb 12.8 oz (66.1 kg)  Height: 5\' 9"  (1.753 m)   Body mass index is 21.53 kg/m. Physical Exam Constitutional:      Appearance: Normal appearance.  Skin:    Findings: Lesion (noted to right side of nose, no errythema or drainage noted) present.  Neurological:     Mental Status: He is alert. Mental status is at baseline.  Psychiatric:        Mood and Affect: Mood normal.     Labs  reviewed: Recent Labs    09/29/21 0506 10/09/21 0000 07/05/22 0000 08/05/22 2020 08/06/22 0434  NA 136   < > 135* 135 137  K 3.9   < > 4.6 3.7 3.4*  CL 103   < > 99 97* 102  CO2 27   < > 24* 27 24  GLUCOSE 108*  --   --  163* 118*  BUN 27*   < > 36* 47* 41*  CREATININE 0.84   < > 1.0 1.20 0.90  CALCIUM 8.2*   < > 8.6* 8.7* 8.4*   < > = values in this interval not displayed.   Recent Labs    04/19/22 0000 08/05/22 2020 08/06/22 0434  AST 19 23 18   ALT 16 7  7  ALKPHOS 94 105 91  BILITOT  --  0.7 1.0  PROT  --  7.8 6.8  ALBUMIN 4.1 4.1 3.5   Recent Labs    09/28/21 1650 09/29/21 0506 10/16/21 0000 04/19/22 0000 06/30/22 0000 07/05/22 0000 08/05/22 2020 08/06/22 0434  WBC 8.2 8.1   < > 7.9   < > 9.2 13.3* 12.3*  NEUTROABS 5.0  --   --  5,601.00  --   --  9.8*  --   HGB 12.8* 13.1   < > 15.0   < > 14.1 14.3 13.1  HCT 38.5* 38.9*   < > 43   < > 41 43.5 39.0  MCV 89.7 90.0  --   --   --   --  90.8 89.2  PLT 259 218   < > 319   < > 309 315 258   < > = values in this interval not displayed.   Lab Results  Component Value Date   TSH 1.957 02/01/2020   Lab Results  Component Value Date   HGBA1C 6.0 (H) 05/31/2021   Lab Results  Component Value Date   CHOL 165 10/31/2019   HDL 46 10/31/2019   LDLCALC 111 (H) 10/31/2019   TRIG 40 10/31/2019   CHOLHDL 3.6 10/31/2019    Significant Diagnostic Results in last 30 days:  No results found.  Assessment/Plan 1. Lesion of skin of face Pt under hospice care. Possible skin cancer due to hx however not causing any discomfort or pain at this time. No signs of infection. Plan to monitor and address if causing pain or discomfort.  Pt in agreement with plan.   Carlos American. [place], [place] [facility] [phone]

## 2022-09-12 ENCOUNTER — Encounter: Payer: Self-pay | Admitting: Student

## 2022-09-13 ENCOUNTER — Encounter: Payer: Self-pay | Admitting: Nurse Practitioner

## 2022-09-13 ENCOUNTER — Non-Acute Institutional Stay (SKILLED_NURSING_FACILITY): Payer: Medicare Other | Admitting: Nurse Practitioner

## 2022-09-13 DIAGNOSIS — I872 Venous insufficiency (chronic) (peripheral): Secondary | ICD-10-CM

## 2022-09-13 DIAGNOSIS — Z515 Encounter for palliative care: Secondary | ICD-10-CM | POA: Diagnosis not present

## 2022-09-13 LAB — CBC AND DIFFERENTIAL
HCT: 39 — AB (ref 41–53)
Hemoglobin: 13.6 (ref 13.5–17.5)
Neutrophils Absolute: 7193
Platelets: 334 10*3/uL (ref 150–400)
WBC: 10.5

## 2022-09-13 LAB — CBC: RBC: 4.46 (ref 3.87–5.11)

## 2022-09-13 NOTE — Progress Notes (Signed)
Location:  Other [facility]) Nursing Home Room Number: [id number] A Place of Service:  SNF (31)  Mckyla Deckman K. Dewaine Oats, NP    Patient Care Team: Dewayne Shorter, MD as PCP - General [facility])  Extended Emergency Contact Information Primary Emergency Contact: Doran Heater Address: [address]          [place], [place] [address] Johnnette Litter of [place] Phone: [phone] Mobile Phone: [phone] Relation: Daughter  Goals of care: Advanced Directive information    09/13/2022   11:51 AM  Advanced Directives  Does Patient Have a Medical Advance Directive? Yes  Type of Advance Directive Out of facility DNR (pink MOST or yellow form)  Does patient want to make changes to medical advance directive? No - Patient declined  Pre-existing out of facility DNR order (yellow form or pink MOST form) Yellow form placed in chart (order not valid for inpatient use);Pink MOST form placed in chart (order not valid for inpatient use)     Chief Complaint  Patient presents with   Acute Visit    Leg swelling     HPI:  Pt is a 89 y.o. male seen today for an acute visit for worsening leg swelling.  Pt with hx of parkinson's disease with dysphagia on hospice. He has been doing well and getting up to walk twice daily. Nursing noticed increase in LE edema with slight redness to lower legs with concerns of cellulitis.  Pt denies any pain to lower legs or heat. No shortness of breath or significant weight gain. He has stopped supplement due to consistency.  No fevers or chills.    Past Medical History:  Diagnosis Date   Asthma    Edema    MILD OF FEET   GERD (gastroesophageal reflux disease)    Hypertension    Mood disorder (HCC)    chronic anxiety   Past Surgical History:  Procedure Laterality Date   CATARACT EXTRACTION W/PHACO Left 02/16/2016   Procedure: CATARACT EXTRACTION PHACO AND INTRAOCULAR LENS PLACEMENT ([place]);  Surgeon: Eulogio Bear, MD;  Location: [facility] ORS;   Service: Ophthalmology;  Laterality: Left;  [place] 1.10 AP% 10.6 CDE 7.41 Fluid Pack Lot # [id number] H   CATARACT EXTRACTION W/PHACO Right 03/08/2016   Procedure: CATARACT EXTRACTION PHACO AND INTRAOCULAR LENS PLACEMENT ([place]);  Surgeon: Eulogio Bear, MD;  Location: [facility] ORS;  Service: Ophthalmology;  Laterality: Right;  [place] 1.14 AP% 11.6 CDE 11.80 Fluid Pack Lot # [id number]   HERNIA REPAIR     HIP ARTHROPLASTY Left 07/06/2016   Procedure: ARTHROPLASTY BIPOLAR HIP (HEMIARTHROPLASTY);  Surgeon: Hessie Knows, MD;  Location: [facility] ORS;  Service: Orthopedics;  Laterality: Left;    Allergies  Allergen Reactions   Doxycycline     Outpatient Encounter Medications as of 09/13/2022  Medication Sig   acetaminophen (TYLENOL) 325 MG tablet Take 650 mg by mouth every 6 (six) hours as needed for mild pain.   alum & mag hydroxide-simeth (MAALOX PLUS) 400-400-40 MG/5ML suspension Take 30 mLs by mouth every 6 (six) hours as needed for indigestion.   bismuth subsalicylate (PEPTO BISMOL) 262 MG/15ML suspension Take 30 mLs by mouth every 4 (four) hours as needed.   budesonide-formoterol (SYMBICORT) 160-4.5 MCG/ACT inhaler Inhale 2 puffs into the lungs 2 (two) times daily. Rinse mouth after use   carbidopa-levodopa (SINEMET CR) 50-200 MG tablet Take 1 tablet by mouth 3 (three) times daily with meals.   cetirizine (ZYRTEC) 10 MG tablet Take 10 mg by mouth daily.   citalopram (CELEXA)  10 MG tablet Take 10 mg by mouth See admin instructions. Take one tablet (10 mg) once daily on Tuesday, Wednesday, Friday, Saturday and Sunday   Eyelid Cleansers (OCUSOFT EYELID CLEANSING) PADS Apply 1 application topically at bedtime. For blepharitis for 90 days   fluticasone (FLONASE) 50 MCG/ACT nasal spray Place 2 sprays into the nose daily.   Infant Care Products (DERMACLOUD) OINT Apply 1 Application topically 3 (three) times daily.   magnesium hydroxide (MILK OF MAGNESIA) 400 MG/5ML suspension Take 30 mLs by mouth daily as needed  for mild constipation.   montelukast (SINGULAIR) 10 MG tablet Take 10 mg by mouth at bedtime.   OXYGEN 2lmp as needed for dyspnea.   prazosin (MINIPRESS) 2 MG capsule Take 2 mg by mouth at bedtime.   spironolactone (ALDACTONE) 50 MG tablet Take 50 mg by mouth daily.   torsemide (DEMADEX) 20 MG tablet Take 40 mg by mouth daily.   triamcinolone cream (KENALOG) 0.1 % Apply 1 Application topically every 12 (twelve) hours as needed. Apply to right thigh, L thigh, Abd topically two times a day for rash morning and evening. apply with petroleum jelly   ZINC OXIDE EX Apply 1 Application topically every 12 (twelve) hours as needed.   [DISCONTINUED] Protein (PROSOURCE PO) Twice daily for wound healing.   No facility-administered encounter medications on file as of 09/13/2022.    Review of Systems  Constitutional:  Negative for activity change and appetite change.  Respiratory:  Negative for shortness of breath.   Cardiovascular:  Positive for leg swelling. Negative for chest pain and palpitations.    Immunization History  Administered Date(s) Administered   Influenza Inj Mdck Quad Pf 03/16/2016   Influenza, High Dose Seasonal PF 03/09/2014, 03/08/2015, 04/03/2022   Influenza-Unspecified 03/08/2015   Moderna Covid-19 Vaccine Bivalent Booster 18yrs & up 11/14/2021   Moderna SARS-COV2 Booster Vaccination 11/04/2020   Moderna Sars-Covid-2 Vaccination 06/29/2019, 07/27/2019, 04/29/2020   Pfizer Covid-19 Vaccine Bivalent Booster 12yrs & up 03/10/2021, 04/27/2022   Tdap 03/09/2014   Pertinent  Health Maintenance Due  Topic Date Due   INFLUENZA VACCINE  Completed      12 /14/2022    8:00 AM 05/31/2021    9:29 PM 06/01/2021   10:24 AM 09/28/2021    4:57 PM 09/29/2021    4:45 AM  Fall Risk  (RETIRED) Patient Fall Risk Level High fall risk Moderate fall risk Moderate fall risk Moderate fall risk Low fall risk   Functional Status Survey:    Vitals:   09/13/22 1147  BP: (!) 103/54  Pulse: 69   Weight: 145 lb (65.8 kg)  Height: 5\' 9"  (1.753 m)   Body mass index is 21.41 kg/m. Physical Exam Constitutional:      General: He is not in acute distress.    Appearance: He is well-developed. He is not diaphoretic.  HENT:     Head: Normocephalic and atraumatic.     Right Ear: External ear normal.     Left Ear: External ear normal.     Mouth/Throat:     Pharynx: No oropharyngeal exudate.  Eyes:     Conjunctiva/sclera: Conjunctivae normal.     Pupils: Pupils are equal, round, and reactive to light.  Cardiovascular:     Rate and Rhythm: Normal rate and regular rhythm.     Heart sounds: Normal heart sounds.  Pulmonary:     Effort: Pulmonary effort is normal.     Breath sounds: Normal breath sounds.  Abdominal:     General: Bowel  sounds are normal.     Palpations: Abdomen is soft.  Musculoskeletal:        General: No tenderness.     Cervical back: Normal range of motion and neck supple.     Right lower leg: Edema present.     Left lower leg: Edema present.     Comments: Swelling noted to bilateral LE, slight redness bilaterally color changes noted consisted with venous statis changes.  Skin:    General: Skin is warm and dry.  Neurological:     Mental Status: He is alert and oriented to person, place, and time.     Labs reviewed: Recent Labs    09/29/21 0506 10/09/21 0000 07/05/22 0000 08/05/22 2020 08/06/22 0434  NA 136   < > 135* 135 137  K 3.9   < > 4.6 3.7 3.4*  CL 103   < > 99 97* 102  CO2 27   < > 24* 27 24  GLUCOSE 108*  --   --  163* 118*  BUN 27*   < > 36* 47* 41*  CREATININE 0.84   < > 1.0 1.20 0.90  CALCIUM 8.2*   < > 8.6* 8.7* 8.4*   < > = values in this interval not displayed.   Recent Labs    04/19/22 0000 08/05/22 2020 08/06/22 0434  AST 19 23 18   ALT 16 7 7   ALKPHOS 94 105 91  BILITOT  --  0.7 1.0  PROT  --  7.8 6.8  ALBUMIN 4.1 4.1 3.5   Recent Labs    09/28/21 1650 09/29/21 0506 10/16/21 0000 04/19/22 0000 06/30/22 0000  07/05/22 0000 08/05/22 2020 08/06/22 0434  WBC 8.2 8.1   < > 7.9   < > 9.2 13.3* 12.3*  NEUTROABS 5.0  --   --  5,601.00  --   --  9.8*  --   HGB 12.8* 13.1   < > 15.0   < > 14.1 14.3 13.1  HCT 38.5* 38.9*   < > 43   < > 41 43.5 39.0  MCV 89.7 90.0  --   --   --   --  90.8 89.2  PLT 259 218   < > 319   < > 309 315 258   < > = values in this interval not displayed.   Lab Results  Component Value Date   TSH 1.957 02/01/2020   Lab Results  Component Value Date   HGBA1C 6.0 (H) 05/31/2021   Lab Results  Component Value Date   CHOL 165 10/31/2019   HDL 46 10/31/2019   LDLCALC 111 (H) 10/31/2019   TRIG 40 10/31/2019   CHOLHDL 3.6 10/31/2019    Significant Diagnostic Results in last 30 days:  No results found.  Assessment/Plan 1. Edema of both lower extremities due to peripheral venous insufficiency -encouraged to elevate legs above level of heart as tolerates, low sodium diet, compression hose as tolerates (on in am, off in pm) -will get cbc with diff (to rule out elevation in wbc)  -discussed with staff about adding supplement to help with nutritional status.  -monitor for worsening redness, pain, swelling.   2. Hospice care - Do not attempt resuscitation (DNR)   Carlos American. [place], [place] [facility] [phone]

## 2022-09-20 LAB — CBC AND DIFFERENTIAL
HCT: 38 — AB (ref 41–53)
Hemoglobin: 13 — AB (ref 13.5–17.5)
Platelets: 314 10*3/uL (ref 150–400)
WBC: 7.9

## 2022-09-20 LAB — CBC: RBC: 4.26 (ref 3.87–5.11)

## 2022-10-09 ENCOUNTER — Non-Acute Institutional Stay (SKILLED_NURSING_FACILITY): Payer: Medicare Other | Admitting: Nurse Practitioner

## 2022-10-09 ENCOUNTER — Encounter: Payer: Self-pay | Admitting: Nurse Practitioner

## 2022-10-09 DIAGNOSIS — F5101 Primary insomnia: Secondary | ICD-10-CM

## 2022-10-09 DIAGNOSIS — E46 Unspecified protein-calorie malnutrition: Secondary | ICD-10-CM

## 2022-10-09 DIAGNOSIS — I872 Venous insufficiency (chronic) (peripheral): Secondary | ICD-10-CM | POA: Diagnosis not present

## 2022-10-09 DIAGNOSIS — F419 Anxiety disorder, unspecified: Secondary | ICD-10-CM

## 2022-10-09 DIAGNOSIS — N401 Enlarged prostate with lower urinary tract symptoms: Secondary | ICD-10-CM | POA: Diagnosis not present

## 2022-10-09 DIAGNOSIS — J309 Allergic rhinitis, unspecified: Secondary | ICD-10-CM

## 2022-10-09 DIAGNOSIS — J45909 Unspecified asthma, uncomplicated: Secondary | ICD-10-CM | POA: Diagnosis not present

## 2022-10-09 DIAGNOSIS — R351 Nocturia: Secondary | ICD-10-CM

## 2022-10-09 DIAGNOSIS — G20B2 Parkinson's disease with dyskinesia, with fluctuations: Secondary | ICD-10-CM

## 2022-10-09 NOTE — Progress Notes (Signed)
Location:  Other [place].  Nursing Home Room Number: [facility] [address] Place of Service:  SNF [phone]) Abbey Chatters, NP  PCP: [name], MD  Patient Care Team: [name], MD as PCP - General [facility])  Extended Emergency Contact Information Primary Emergency Contact: Emilie Rutter Address: [address]          [place], [place] [address] [name] of [place] Home Phone: [phone] Mobile Phone: [phone] Relation: Daughter  Goals of care: Advanced Directive information    10/09/2022   10:38 AM  Advanced Directives  Does Patient Have a Medical Advance Directive? Yes  Type of Advance Directive Out of facility DNR (pink MOST or yellow form)  Does patient want to make changes to medical advance directive? No - Patient declined     Chief Complaint  Patient presents with   Medical Management of Chronic Issues    Medical Management of Chronic Issues.     HPI:  Pt is a 89 y.o. male seen today for medical management of chronic disease. Pt on hospice care due to dysphagia due to aspiration.  Reports he is tired of coughing and choking with meals. Wants to be done with this.  Daughter and son-in-law have been with him.  He had a major coughing fit yesterday and that's when daughter visited and he shared he was ready for it to be over with. Does better when family is around.  He continues to lose weight.   He is having a hard time sleeping at night due to cough and congestion. Tried melatonin but this effected his dreams     Past Medical History:  Diagnosis Date   Asthma    Edema    MILD OF FEET   GERD (gastroesophageal reflux disease)    Hypertension    Mood disorder    chronic anxiety   Past Surgical History:  Procedure Laterality Date   CATARACT EXTRACTION W/PHACO Left 02/16/2016   Procedure: CATARACT EXTRACTION PHACO AND INTRAOCULAR LENS PLACEMENT ([place]);  Surgeon: [place] Crane, MD;  Location: [facility] ORS;  Service: Ophthalmology;   Laterality: Left;  [place] 1.10 AP% 10.6 CDE 7.41 Fluid Pack Lot # [id number]   CATARACT EXTRACTION W/PHACO Right 03/08/2016   Procedure: CATARACT EXTRACTION PHACO AND INTRAOCULAR LENS PLACEMENT ([place]);  Surgeon: Nevada Crane, MD;  Location: [facility] ORS;  Service: Ophthalmology;  Laterality: Right;  [place] 1.14 AP% 11.6 CDE 11.80 Fluid Pack Lot # [id number]   HERNIA REPAIR     HIP ARTHROPLASTY Left 07/06/2016   Procedure: ARTHROPLASTY BIPOLAR HIP (HEMIARTHROPLASTY);  Surgeon: Kennedy Bucker, MD;  Location: [facility] ORS;  Service: Orthopedics;  Laterality: Left;    Allergies  Allergen Reactions   Doxycycline     Outpatient Encounter Medications as of 10/09/2022  Medication Sig   acetaminophen (TYLENOL) 325 MG tablet Take 650 mg by mouth every 6 (six) hours as needed for mild pain.   alum & mag hydroxide-simeth (MAALOX PLUS) 400-400-40 MG/5ML suspension Take 30 mLs by mouth every 6 (six) hours as needed for indigestion.   bismuth subsalicylate (PEPTO BISMOL) 262 MG/15ML suspension Take 30 mLs by mouth every 4 (four) hours as needed.   budesonide-formoterol (SYMBICORT) 160-4.5 MCG/ACT inhaler Inhale 2 puffs into the lungs 2 (two) times daily. Rinse mouth after use   carbidopa-levodopa (SINEMET CR) 50-200 MG tablet Take 1 tablet by mouth 3 (three) times daily with meals.   cetirizine (ZYRTEC) 10 MG tablet Take 10 mg by mouth daily.   citalopram (CELEXA) 10 MG  tablet Take 10 mg by mouth See admin instructions. Take one tablet (10 mg) once daily on Tuesday, Wednesday, Friday, Saturday and /14/2022    8:00 AM 05/31/2021    9:29 PM 06/01/2021   10:24 AM 09/28/2021    4:57 PM 09/29/2021    4:45 AM  Fall Risk  (RETIRED) Patient Fall Risk Level High fall risk Moderate fall risk Moderate fall risk Moderate fall risk Low fall risk   Functional Status Survey:    Vitals:   10/09/22 1032  BP: (!) 93/55  Pulse: 66  Resp: 20  Temp: 98 F (36.7 C)  SpO2: 93%  Weight: 142 lb 9.6 oz (64.7 kg)  Height:  (1.753 m)   Body mass index is 21.06 kg/m. Physical Exam Constitutional:      General: He is not in acute distress.    Appearance: He is well-developed. He is not diaphoretic.  HENT:     Head: Normocephalic and atraumatic.     Right Ear: External ear normal.     Left Ear: External ear normal.     Mouth/Throat:  Pharynx: No oropharyngeal exudate.  Eyes:     Conjunctiva/sclera: Conjunctivae normal.     Pupils: Pupils are equal, round, and reactive to light.  Cardiovascular:     Rate and Rhythm: Normal rate and regular rhythm.     Heart sounds: Normal heart sounds.  Pulmonary:     Effort: Pulmonary effort is normal.     Breath sounds: Rhonchi present.  Abdominal:     General: Bowel sounds are normal.     Palpations: Abdomen is soft.  Musculoskeletal:        General: No tenderness.     Cervical back: Normal range of motion and neck supple.     Right lower leg: No edema.     Left lower leg: No edema.  Skin:    General: Skin is warm and dry.  Neurological:     Mental Status: He is alert and oriented to person, place, and time.     Labs reviewed: Recent Labs    07/05/22 0000 08/05/22 2020 08/06/22 0434  NA 135* 135 137  K 4.6 3.7 3.4*  CL 99 97* 102  CO2 24* 27 24  GLUCOSE  --  163* 118*  BUN 36* 47* 41*  CREATININE 1.0 1.20 0.90  CALCIUM 8.6* 8.7* 8.4*   Recent Labs    04/19/22 0000 08/05/22 2020 08/06/22 0434  AST 19 23 18   ALT 16  7 7   ALKPHOS 94 105 91  BILITOT  --  0.7 1.0  PROT  --  7.8 6.8  ALBUMIN 4.1 4.1 3.5   Recent Labs    04/19/22 0000 06/30/22 0000 08/05/22 2020 08/06/22 0434 09/13/22 0000 09/20/22 0000  WBC 7.9   < > 13.3* 12.3* 10.5 7.9  NEUTROABS 5,601.00  --  9.8*  --  7,193.00  --   HGB 15.0   < > 14.3 13.1 13.6 13.0*  HCT 43   < > 43.5 39.0 39* 38*  MCV  --   --  90.8 89.2  --   --   PLT 319   < > 315 258 334 314   < > = values in this interval not displayed.   Lab Results  Component Value Date   TSH 1.957 02/01/2020   Lab Results  Component Value Date   HGBA1C 6.0 (H) 05/31/2021   Lab Results  Component Value Date   CHOL 165 10/31/2019   HDL 46 10/31/2019   LDLCALC 111 (H) 10/31/2019   TRIG 40 10/31/2019   CHOLHDL 3.6 10/31/2019    Significant Diagnostic Results in last 30 days:  No results found.  Assessment/Plan 1. BPH associated with nocturia -bp low, will decrease prozosin to 1 mg qhs  2. Edema of both lower extremities due to peripheral venous insufficiency He is well controlled on aldactone and Demadex, will continue current dose at this time.  3. Allergic rhinitis, unspecified seasonality, unspecified trigger Stable on zyrtec and Singulair, due to his hx of allergies will continue at this time.   4. Primary insomnia -ongoing issue, will add traZodone 25 mg daily at bedtime  5. Asthma, unspecified asthma severity, unspecified whether complicated, unspecified whether persistent -continues on symbicort  6. Anxiety disorder, unspecified type -ongoing, continues on celexa, trazadone added to help with sleep and hopefully will benefit mood.   7. Parkinson's disease with dyskinesia and fluctuating manifestations -continues to be able to walk at this tme Continues on sinemet TID  8. Protein deficiency -ongoing and expect to worsen with disease progression of dysphagia.  Continues on hospice care.    Janene Harvey. [facility] [phone]

## 2022-10-10 ENCOUNTER — Encounter: Payer: Self-pay | Admitting: Student

## 2022-10-10 ENCOUNTER — Non-Acute Institutional Stay (SKILLED_NURSING_FACILITY): Admitting: Student

## 2022-10-10 DIAGNOSIS — F4310 Post-traumatic stress disorder, unspecified: Secondary | ICD-10-CM

## 2022-10-10 DIAGNOSIS — F5101 Primary insomnia: Secondary | ICD-10-CM

## 2022-10-10 NOTE — Progress Notes (Signed)
Location:  [facility] [place].  Nursing Home Room Number: [facility] [address] Place of Service:  SNF [phone]) Provider:  [name], MD  Patient Care Team: [name], MD as PCP - General [facility])  Extended Emergency Contact Information Primary Emergency Contact: Emilie Rutter Address: [address]          [place], [place] [address] [name] of [place] Home Phone: [phone] Mobile Phone: [phone] Relation: Daughter  Code Status:  DNR Goals of care: Advanced Directive information    10/10/2022   12:39 PM  Advanced Directives  Does Patient Have a Medical Advance Directive? Yes  Type of Advance Directive Out of facility DNR (pink MOST or yellow form)  Does patient want to make changes to medical advance directive? No - Patient declined     Chief Complaint  Patient presents with   Acute Visit    Hospice Care.     HPI:  Pt is a 89 y.o. male seen today for an acute visit for follow up of medication change.   Spoke with patient and he states his sleep last night was suspiciously more peaceful. He states over and over he is frustrated that I interrupted his schedule because he needs to finish his breakfast before going to his hair cut at 9:30. When asked how he is doing emotionally because he has had changes in his function, he states, I'm currently upset that you are interrupting my breakfast." He says his daughter called him early, but he doesn't remember calling her. He has no other concerns at this time.   Spoke with daughter and nursing: Patient called nursing to bedside last night, however, when they arrived, he was confused. Patient also called his daughter this morning at 5:30 this morning stating, "I need to speak to the doctors." He has reported he is ready for everything to be over, but changes base on who he is speaking to. Spoke with Hospice chaplain and told them "he wants to fight."    Past Medical History:  Diagnosis Date   Asthma     Edema    MILD OF FEET   GERD (gastroesophageal reflux disease)    Hypertension    Mood disorder    chronic anxiety   Past Surgical History:  Procedure Laterality Date   CATARACT EXTRACTION W/PHACO Left 02/16/2016   Procedure: CATARACT EXTRACTION PHACO AND INTRAOCULAR LENS PLACEMENT (IOC);  Surgeon: Nevada Crane, MD;  Location: [facility] ORS;  Service: Ophthalmology;  Laterality: Left;  [place] 1.10 AP% 10.6 CDE 7.41 Fluid Pack Lot # [id number]   CATARACT EXTRACTION W/PHACO Right 03/08/2016   Procedure: CATARACT EXTRACTION PHACO AND INTRAOCULAR LENS PLACEMENT (IOC);  Surgeon: Nevada Crane, MD;  Location: [facility] ORS;  Service: Ophthalmology;  Laterality: Right;  [place] 1.14 AP% 11.6 CDE 11.80 Fluid Pack Lot # [id number] H   HERNIA REPAIR     HIP ARTHROPLASTY Left 07/06/2016   Procedure: ARTHROPLASTY BIPOLAR HIP (HEMIARTHROPLASTY);  Surgeon: Kennedy Bucker, MD;  Location: [facility] ORS;  Service: Orthopedics;  Laterality: Left;    Allergies  Allergen Reactions   Doxycycline     Outpatient Encounter Medications as of 10/10/2022  Medication Sig   acetaminophen (TYLENOL) 325 MG tablet Take 650 mg by mouth every 6 (six) hours as needed for mild pain.   alum & mag hydroxide-simeth (MAALOX PLUS) 400-400-40 MG/5ML suspension Take 30 mLs by mouth every 6 (six) hours as needed for indigestion.   bismuth subsalicylate (PEPTO BISMOL) 262 MG/15ML suspension Take 30 mLs by mouth  every 4 (four) hours as needed.   budesonide-formoterol (SYMBICORT) 160-4.5 MCG/ACT inhaler Inhale 2 puffs into the lungs 2 (two) times daily. Rinse mouth after use   carbidopa-levodopa (SINEMET CR) 50-200 MG tablet Take 1 tablet by mouth 3 (three) times daily with meals.   cetirizine (ZYRTEC) 10 MG tablet Take 10 mg by mouth daily.   citalopram (CELEXA) 10 MG tablet Take 10 mg by mouth See admin instructions. Take one tablet (10 mg) once daily on Tuesday, Wednesday, Friday, Saturday and /14/2022    8:00 AM 05/31/2021    9:29 PM 06/01/2021   10:24 AM 09/28/2021    4:57 PM 09/29/2021    4:45 AM  Fall Risk  (RETIRED) Patient Fall Risk Level High fall risk Moderate fall risk  Moderate fall risk Moderate fall risk Low fall risk   Functional Status Survey:    Vitals:   10/10/22 1234  BP: 102/62  Pulse: 76  Resp: 20  Temp: 98 F (36.7 C)  SpO2: 93%  Weight: 140 lb 3.2 oz (63.6 kg)  Height:  (1.753 m)   Body mass index is 20.7 kg/m. Physical Exam Constitutional:      Appearance: Normal appearance.  Cardiovascular:     Rate and Rhythm: Normal rate.     Pulses: Normal pulses.  Neurological:     General: No focal deficit present.     Mental Status: He is alert.     Comments: Oriented to self, place, situation.     Labs reviewed: Recent Labs    07/05/22 0000 08/05/22 2020 08/06/22 0434  NA 135* 135 137  K 4.6 3.7 3.4*  CL 99 97* 102  CO2 24* 27  24  GLUCOSE  --  163* 118*  BUN 36* 47* 41*  CREATININE 1.0 1.20 0.90  CALCIUM 8.6* 8.7* 8.4*   Recent Labs    04/19/22 0000 08/05/22 2020 08/06/22 0434  AST ALT ALKPHOS 94 105 91  BILITOT  --  0.7 1.0  PROT  --  7.8 6.8  ALBUMIN 4.1 4.1 3.5   Recent Labs    04/19/22 0000 06/30/22 0000 08/05/22 2020 08/06/22 0434 09/13/22 0000 09/20/22 0000  WBC 7.9   < > 13.3* 12.3* 10.5 7.9  NEUTROABS 5,601.00  --  9.8*  --  7,193.00  --   HGB 15.0   < > 14.3 13.1 13.6 13.0*  HCT 43   < > 43.5 39.0 39* 38*  MCV  --   --  90.8 89.2  --   --   PLT 319   < > 315 258 334 314   < > = values in this interval not displayed.   Lab Results  Component Value Date   TSH 1.957 02/01/2020   Lab Results  Component Value Date   HGBA1C 6.0 (H) 05/31/2021   Lab Results  Component Value Date   CHOL 165 10/31/2019   HDL 46 10/31/2019   LDLCALC 111 (H) 10/31/2019   TRIG 40 10/31/2019   CHOLHDL 3.6 10/31/2019    Significant Diagnostic Results in last 30 days:  No results found.  Assessment/Plan Primary insomnia  PTSD (post-traumatic stress disorder) Patient with recent addition of trazodone which seems to have helped wit his sleep, however, patient has had some issues (for  weeks) with ringing call bell or calling his daughter and not recalling. Recently had decrease in prazosin as well due to recurring nightmares with foster care. Plan to continue trazodone for 1 week trial, if no improvement will change to mirtazapine based on some evidence that this could help in psychological hyperactivity in patients who have PD. Patient without vital sign abnormalities or symptoms of infection to lead to intermittent lucidity. All of these changes are reflective of his decline. Continues to lose weight. Will follow up with nursing in 1 week.    Family/ staff Communication: nursing, Darl Pikes *HCPOA  Labs/tests ordered:  none

## 2022-10-17 LAB — BASIC METABOLIC PANEL
BUN: 30 — AB (ref 4–21)
CO2: 27 — AB (ref 13–22)
Chloride: 99 (ref 99–108)
Creatinine: 0.9 (ref 0.6–1.3)
Glucose: 84
Potassium: 3.6 mEq/L (ref 3.5–5.1)
Sodium: 139 (ref 137–147)

## 2022-10-17 LAB — CBC: RBC: 4.47 (ref 3.87–5.11)

## 2022-10-17 LAB — CBC AND DIFFERENTIAL
HCT: 40 — AB (ref 41–53)
Hemoglobin: 13.3 — AB (ref 13.5–17.5)
Neutrophils Absolute: 5794
WBC: 8.9

## 2022-10-17 LAB — HEPATIC FUNCTION PANEL
ALT: 11 U/L (ref 10–40)
AST: 16 (ref 14–40)
Alkaline Phosphatase: 123 (ref 25–125)
Bilirubin, Total: 0.6

## 2022-10-17 LAB — COMPREHENSIVE METABOLIC PANEL
Albumin: 3.4 — AB (ref 3.5–5.0)
Calcium: 8.7 (ref 8.7–10.7)
Globulin: 2.7
eGFR: 81

## 2022-10-19 ENCOUNTER — Encounter: Payer: Self-pay | Admitting: Student

## 2022-10-19 ENCOUNTER — Non-Acute Institutional Stay (SKILLED_NURSING_FACILITY): Admitting: Student

## 2022-10-19 DIAGNOSIS — L84 Corns and callosities: Secondary | ICD-10-CM

## 2022-10-19 DIAGNOSIS — I872 Venous insufficiency (chronic) (peripheral): Secondary | ICD-10-CM

## 2022-10-19 DIAGNOSIS — T17908S Unspecified foreign body in respiratory tract, part unspecified causing other injury, sequela: Secondary | ICD-10-CM

## 2022-10-19 NOTE — Progress Notes (Signed)
Location:  [facility].  Nursing Home Room Number: [facility] [address] Place of Service:  SNF [phone]) Provider:  [name], MD  Patient Care Team: [name], MD as PCP - General [facility] Medicine)  Extended Emergency Contact Information Primary Emergency Contact: Emilie Rutter Address: [address]          [place], [place] [address] [name] of [place] Home Phone: [phone] Mobile Phone: [phone] Relation: Daughter  Code Status:  DNR Goals of care: Advanced Directive information    10/19/2022    9:21 AM  Advanced Directives  Does Patient Have a Medical Advance Directive? Yes  Type of Advance Directive Out of facility DNR (pink MOST or yellow form)  Does patient want to make changes to medical advance directive? No - Patient declined     Chief Complaint  Patient presents with   Acute Visit    Skin Check.     HPI:  Pt is a 89 y.o. male seen today for an acute visit for Skin Check.  Patient is eating breakfast. He has some pain on the second toe-- compression stocking is too tight on his foot.   Nursing has concern for redness in his skin and sore on his toe.    Past Medical History:  Diagnosis Date   Asthma    Edema    MILD OF FEET   GERD (gastroesophageal reflux disease)    Hypertension    Mood disorder (HCC)    chronic anxiety   Past Surgical History:  Procedure Laterality Date   CATARACT EXTRACTION W/PHACO Left 02/16/2016   Procedure: CATARACT EXTRACTION PHACO AND INTRAOCULAR LENS PLACEMENT (IOC);  Surgeon: Nevada Crane, MD;  Location: [facility] ORS;  Service: Ophthalmology;  Laterality: Left;  [place] 1.10 AP% 10.6 CDE 7.41 Fluid Pack Lot # [id number] H   CATARACT EXTRACTION W/PHACO Right 03/08/2016   Procedure: CATARACT EXTRACTION PHACO AND INTRAOCULAR LENS PLACEMENT (IOC);  Surgeon: Nevada Crane, MD;  Location: [facility] ORS;  Service: Ophthalmology;  Laterality: Right;  [place] 1.14 AP% 11.6 CDE 11.80 Fluid Pack Lot # [id number] H   HERNIA  REPAIR     HIP ARTHROPLASTY Left 07/06/2016   Procedure: ARTHROPLASTY BIPOLAR HIP (HEMIARTHROPLASTY);  Surgeon: Kennedy Bucker, MD;  Location: [facility] ORS;  Service: Orthopedics;  Laterality: Left;    Allergies  Allergen Reactions   Doxycycline     Outpatient Encounter Medications as of 10/19/2022  Medication Sig   acetaminophen (TYLENOL) 325 MG tablet Take 650 mg by mouth every 6 (six) hours as needed for mild pain.   alum & mag hydroxide-simeth (MAALOX PLUS) 400-400-40 MG/5ML suspension Take 30 mLs by mouth every 6 (six) hours as needed for indigestion.   bismuth subsalicylate (PEPTO BISMOL) 262 MG/15ML suspension Take 30 mLs by mouth every 4 (four) hours as needed.   budesonide-formoterol (SYMBICORT) 160-4.5 MCG/ACT inhaler Inhale 2 puffs into the lungs 2 (two) times daily. Rinse mouth after use   carbidopa-levodopa (SINEMET CR) 50-200 MG tablet Take 1 tablet by mouth 3 (three) times daily with meals.   cetirizine (ZYRTEC) 10 MG tablet Take 10 mg by mouth daily.   citalopram (CELEXA) 10 MG tablet Take 10 mg by mouth See admin instructions. Take one tablet (10 mg) once daily on Tuesday, Wednesday, Friday, Saturday and Sunday   Eyelid Cleansers (OCUSOFT EYELID CLEANSING) PADS Apply 1 application topically at bedtime. For blepharitis for 90 days   fluticasone (FLONASE) 50 MCG/ACT nasal spray Place 2 sprays into the nose daily.   Infant Care Products (  DERMACLOUD) OINT Apply 1 Application topically 3 (three) times daily.   magnesium hydroxide (MILK OF MAGNESIA) 400 MG/5ML suspension Take 30 mLs by mouth daily as needed for mild constipation.   montelukast (SINGULAIR) 10 MG tablet Take 10 mg by mouth at bedtime.   OXYGEN 2lmp as needed for dyspnea.   prazosin (MINIPRESS) 2 MG capsule Take 2 mg by mouth at bedtime.   spironolactone (ALDACTONE) 50 MG tablet Take 50 mg by mouth daily.   torsemide (DEMADEX) 20 MG tablet Take 40 mg by mouth daily.   traZODone (DESYREL) 50 MG tablet Take 25 mg by mouth at  bedtime.   triamcinolone cream (KENALOG) 0.1 % Apply 1 Application topically every 12 (twelve) hours as needed. Apply to right thigh, L thigh, Abd topically two times a day for rash morning and evening. apply with petroleum jelly   ZINC OXIDE EX Apply 1 Application topically every 12 (twelve) hours as needed.   No facility-administered encounter medications on file as of 10/19/2022.    Review of Systems  Immunization History  Administered Date(s) Administered   Influenza Inj Mdck Quad Pf 03/16/2016   Influenza, High Dose Seasonal PF 03/09/2014, 03/08/2015, 04/03/2022   Influenza-Unspecified 03/08/2015   Moderna Covid-19 Vaccine Bivalent Booster 78yrs & up 11/14/2021   Moderna SARS-COV2 Booster Vaccination 11/04/2020   Moderna Sars-Covid-2 Vaccination 06/29/2019, 07/27/2019, 04/29/2020   Pfizer Covid-19 Vaccine Bivalent Booster 65yrs & up 03/10/2021, 04/27/2022   Tdap 03/09/2014   Pertinent  Health Maintenance Due  Topic Date Due   INFLUENZA VACCINE  01/17/2023      05/31/2021    8:00 AM 05/31/2021    9:29 PM 06/01/2021   10:24 AM 09/28/2021    4:57 PM 09/29/2021    4:45 AM  Fall Risk  (RETIRED) Patient Fall Risk Level High fall risk Moderate fall risk Moderate fall risk Moderate fall risk Low fall risk   Functional Status Survey:    Vitals:   10/19/22 0912  BP: (!) 97/57  Pulse: 70  Resp: 20  Temp: 98 F (36.7 C)  SpO2: 93%  Weight: 139 lb (63 kg)  Height: 5\' 9"  (1.753 m)   Body mass index is 20.53 kg/m. Physical Exam Constitutional:      Comments: Thin, frail  Cardiovascular:     Rate and Rhythm: Normal rate.  Pulmonary:     Breath sounds: Rhonchi present.     Comments: Productive cough Skin:    Comments: Right foot 2nd toe hyerkeratotic area. Trace edema RLE, erythematous, nontender  Neurological:     Mental Status: He is alert and oriented to person, place, and time.     Labs reviewed: Recent Labs    08/05/22 2020 08/06/22 0434 10/17/22 0000  NA  135 137 139  K 3.7 3.4* 3.6  CL 97* 102 99  CO2 27 24 27*  GLUCOSE 163* 118*  --   BUN 47* 41* 30*  CREATININE 1.20 0.90 0.9  CALCIUM 8.7* 8.4* 8.7   Recent Labs    08/05/22 2020 08/06/22 0434 10/17/22 0000  AST 23 18 16   ALT 7 7 11   ALKPHOS 105 91 123  BILITOT 0.7 1.0  --   PROT 7.8 6.8  --   ALBUMIN 4.1 3.5 3.4*   Recent Labs    08/05/22 2020 08/06/22 0434 09/13/22 0000 09/20/22 0000 10/17/22 0000  WBC 13.3* 12.3* 10.5 7.9 8.9  NEUTROABS 9.8*  --  7,193.00  --  5,794.00  HGB 14.3 13.1 13.6 13.0* 13.3*  HCT  43.5 39.0 39* 38* 40*  MCV 90.8 89.2  --   --   --   PLT 315 258 334 314  --    Lab Results  Component Value Date   TSH 1.957 02/01/2020   Lab Results  Component Value Date   HGBA1C 6.0 (H) 05/31/2021   Lab Results  Component Value Date   CHOL 165 10/31/2019   HDL 46 10/31/2019   LDLCALC 111 (H) 10/31/2019   TRIG 40 10/31/2019   CHOLHDL 3.6 10/31/2019    Significant Diagnostic Results in last 30 days:  No results found.  Assessment/Plan Callus of toe  Venous insufficiency of both lower extremities  Chronic pulmonary aspiration, sequela Patient with new painful callus of right foot. Plan to follow up with podiatry in 1 week. Compression as tolerated. Patient is on hospice and DNR status, will continue to seek comfort measures at this time. Rhonchi expected and persistent given patient's history of aspiration. Vital signs stable at this time breathing comfortably on room air. CTM.   Family/ staff Communication: nursing  Labs/tests ordered:  none

## 2022-11-26 ENCOUNTER — Encounter: Payer: Self-pay | Admitting: Student

## 2022-11-26 ENCOUNTER — Non-Acute Institutional Stay (SKILLED_NURSING_FACILITY): Admitting: Student

## 2022-11-26 DIAGNOSIS — L98429 Non-pressure chronic ulcer of back with unspecified severity: Secondary | ICD-10-CM

## 2022-11-26 DIAGNOSIS — N401 Enlarged prostate with lower urinary tract symptoms: Secondary | ICD-10-CM | POA: Diagnosis not present

## 2022-11-26 DIAGNOSIS — R351 Nocturia: Secondary | ICD-10-CM

## 2022-11-26 DIAGNOSIS — F4310 Post-traumatic stress disorder, unspecified: Secondary | ICD-10-CM

## 2022-11-26 DIAGNOSIS — E43 Unspecified severe protein-calorie malnutrition: Secondary | ICD-10-CM | POA: Diagnosis not present

## 2022-11-26 DIAGNOSIS — R1312 Dysphagia, oropharyngeal phase: Secondary | ICD-10-CM

## 2022-11-26 DIAGNOSIS — G20B2 Parkinson's disease with dyskinesia, with fluctuations: Secondary | ICD-10-CM | POA: Diagnosis not present

## 2022-11-26 DIAGNOSIS — Z66 Do not resuscitate: Secondary | ICD-10-CM

## 2022-11-26 DIAGNOSIS — I872 Venous insufficiency (chronic) (peripheral): Secondary | ICD-10-CM

## 2022-11-26 NOTE — Progress Notes (Signed)
Location:  [facility] [place].  Nursing Home Room Number: [facility] [address] Place of Service:  SNF [phone]) Provider:  [name], MD  Patient Care Team: [name], MD as PCP - General [facility] Medicine)  Extended Emergency Contact Information Primary Emergency Contact: Emilie Rutter Address: [address]          [place], [place] [address] [name] of [place] Home Phone: [phone] Mobile Phone: [phone] Relation: Daughter  Code Status:  DNR Goals of care: Advanced Directive information    11/26/2022   10:12 AM  Advanced Directives  Does Patient Have a Medical Advance Directive? Yes  Type of Advance Directive Out of facility DNR (pink MOST or yellow form)  Does patient want to make changes to medical advance directive? No - Patient declined     Chief Complaint  Patient presents with   Medical Management of Chronic Issues    Medical Management of Chronic Issues.     HPI:  Pt is a 89 y.o. male seen today for medical management of chronic diseases.    He has a bit of pain in the right 2nd toe. He has had the area on his toe for quite a while.   He has a spot on his nose. It's not hurting. It only bleeds when he rubs against it and the scab. It's made it difficult to wash his face because it's on the center of his nose.   Darl Pikes is aware of all of the issues.   He has been eating well. He was taking an assisted shower and when they finished the showere she asked if he would like a shave. After that she doesn't remember anything after that. He felt like after 8PM he doesn't remember much of anything. He has had a few other instances that he cannot recollect. He isn't sure if it's the sleep medication or not.   Past Medical History:  Diagnosis Date   Asthma    Edema    MILD OF FEET   GERD (gastroesophageal reflux disease)    Hypertension    Mood disorder (HCC)    chronic anxiety   Past Surgical History:  Procedure Laterality Date   CATARACT  EXTRACTION W/PHACO Left 02/16/2016   Procedure: CATARACT EXTRACTION PHACO AND INTRAOCULAR LENS PLACEMENT (IOC);  Surgeon: Nevada Crane, MD;  Location: [facility] ORS;  Service: Ophthalmology;  Laterality: Left;  [place] 1.10 AP% 10.6 CDE 7.41 Fluid Pack Lot # [id number] H   CATARACT EXTRACTION W/PHACO Right 03/08/2016   Procedure: CATARACT EXTRACTION PHACO AND INTRAOCULAR LENS PLACEMENT (IOC);  Surgeon: Nevada Crane, MD;  Location: [facility] ORS;  Service: Ophthalmology;  Laterality: Right;  [place] 1.14 AP% 11.6 CDE 11.80 Fluid Pack Lot # [id number] H   HERNIA REPAIR     HIP ARTHROPLASTY Left 07/06/2016   Procedure: ARTHROPLASTY BIPOLAR HIP (HEMIARTHROPLASTY);  Surgeon: Kennedy Bucker, MD;  Location: [facility] ORS;  Service: Orthopedics;  Laterality: Left;    Allergies  Allergen Reactions   Doxycycline     Outpatient Encounter Medications as of 11/26/2022  Medication Sig   acetaminophen (TYLENOL) 325 MG tablet Take 650 mg by mouth every 6 (six) hours as needed for mild pain.   alum & mag hydroxide-simeth (MAALOX PLUS) 400-400-40 MG/5ML suspension Take 30 mLs by mouth every 6 (six) hours as needed for indigestion.   bismuth subsalicylate (PEPTO BISMOL) 262 MG/15ML suspension Take 30 mLs by mouth every 4 (four) hours as needed.   budesonide-formoterol (SYMBICORT) 160-4.5 MCG/ACT inhaler Inhale 2 puffs into the  lungs 2 (two) times daily. Rinse mouth after use   carbidopa-levodopa (SINEMET CR) 50-200 MG tablet Take 1 tablet by mouth 3 (three) times daily with meals.   cetirizine (ZYRTEC) 10 MG tablet Take 10 mg by mouth daily.   citalopram (CELEXA) 10 MG tablet Take 10 mg by mouth See admin instructions. Take one tablet (10 mg) once daily on Tuesday, Wednesday, Friday, Saturday and Sunday   Eyelid Cleansers (OCUSOFT EYELID CLEANSING) PADS Apply 1 application topically at bedtime. For blepharitis for 90 days   fluticasone (FLONASE) 50 MCG/ACT nasal spray Place 2 sprays into the nose daily.   Infant Care Products  (DERMACLOUD) OINT Apply 1 Application topically 3 (three) times daily.   magnesium hydroxide (MILK OF MAGNESIA) 400 MG/5ML suspension Take 30 mLs by mouth daily as needed for mild constipation.   montelukast (SINGULAIR) 10 MG tablet Take 10 mg by mouth at bedtime.   OXYGEN 2lmp as needed for dyspnea.   prazosin (MINIPRESS) 2 MG capsule Take 2 mg by mouth at bedtime.   spironolactone (ALDACTONE) 50 MG tablet Take 50 mg by mouth daily.   torsemide (DEMADEX) 20 MG tablet Take 40 mg by mouth daily.   traZODone (DESYREL) 50 MG tablet Take 25 mg by mouth at bedtime.   triamcinolone cream (KENALOG) 0.1 % Apply 1 Application topically every 12 (twelve) hours as needed. Apply to right thigh, L thigh, Abd topically two times a day for rash morning and evening. apply with petroleum jelly   ZINC OXIDE EX Apply 1 Application topically every 12 (twelve) hours as needed.   No facility-administered encounter medications on file as of 11/26/2022.    Review of Systems  Immunization History  Administered Date(s) Administered   Influenza Inj Mdck Quad Pf 03/16/2016   Influenza, High Dose Seasonal PF 03/09/2014, 03/08/2015, 04/03/2022   Influenza-Unspecified 03/08/2015   Moderna Covid-19 Vaccine Bivalent Booster 18yrs & up 11/14/2021   Moderna SARS-COV2 Booster Vaccination 11/04/2020   Moderna Sars-Covid-2 Vaccination 06/29/2019, 07/27/2019, 04/29/2020   Pfizer Covid-19 Vaccine Bivalent Booster 12yrs & up 03/10/2021, 04/27/2022   Tdap 03/09/2014   Pertinent  Health Maintenance Due  Topic Date Due   INFLUENZA VACCINE  01/17/2023      12 /14/2022    8:00 AM 05/31/2021    9:29 PM 06/01/2021   10:24 AM 09/28/2021    4:57 PM 09/29/2021    4:45 AM  Fall Risk  (RETIRED) Patient Fall Risk Level High fall risk Moderate fall risk Moderate fall risk Moderate fall risk Low fall risk   Functional Status Survey:    Vitals:   11/26/22 1005 11/26/22 1006  BP: (!) 97/58 (!) 97/58  Pulse: 66   Resp: 18   Temp:  97.7 F (36.5 C)   SpO2: 94%   Weight: 135 lb 9.6 oz (61.5 kg)   Height: 5\' 9"  (1.753 m)    Body mass index is 20.02 kg/m. Physical Exam Constitutional:      Comments: Thin, muscle wasting of temporal wasting, supraclavicular muscle wasting  Cardiovascular:     Rate and Rhythm: Normal rate.     Pulses: Normal pulses.  Pulmonary:     Comments: Bilateral rhonchi, cough with deep inspiration Abdominal:     Palpations: Abdomen is soft.  Musculoskeletal:     Comments: Trace swelling bilaterally  Skin:    General: Skin is warm.  Neurological:     Mental Status: He is oriented to person, place, and time.     Labs reviewed: Recent Labs  08/05/22 2020 08/06/22 0434 10/17/22 0000  NA 135 137 139  K 3.7 3.4* 3.6  CL 97* 102 99  CO2 27 24 27*  GLUCOSE 163* 118*  --   BUN 47* 41* 30*  CREATININE 1.20 0.90 0.9  CALCIUM 8.7* 8.4* 8.7   Recent Labs    08/05/22 2020 08/06/22 0434 10/17/22 0000  AST 23 18 16   ALT 7 7 11   ALKPHOS 105 91 123  BILITOT 0.7 1.0  --   PROT 7.8 6.8  --   ALBUMIN 4.1 3.5 3.4*   Recent Labs    08/05/22 2020 08/06/22 0434 09/13/22 0000 09/20/22 0000 10/17/22 0000  WBC 13.3* 12.3* 10.5 7.9 8.9  NEUTROABS 9.8*  --  7,193.00  --  5,794.00  HGB 14.3 13.1 13.6 13.0* 13.3*  HCT 43.5 39.0 39* 38* 40*  MCV 90.8 89.2  --   --   --   PLT 315 258 334 314  --    Lab Results  Component Value Date   TSH 1.957 02/01/2020   Lab Results  Component Value Date   HGBA1C 6.0 (H) 05/31/2021   Lab Results  Component Value Date   CHOL 165 10/31/2019   HDL 46 10/31/2019   LDLCALC 111 (H) 10/31/2019   TRIG 40 10/31/2019   CHOLHDL 3.6 10/31/2019    Significant Diagnostic Results in last 30 days:  No results found.  Assessment/Plan Severe protein-calorie malnutrition (HCC)  Parkinson's disease with dyskinesia and fluctuating manifestations, Chronic  PTSD (post-traumatic stress disorder)  BPH associated with nocturia  Edema of both lower  extremities due to peripheral venous insufficiency  Stage 2 skin ulcer of sacral region [facility])  Oropharyngeal dysphagia  Do not resuscitate Patient has lost 22 lbs since January of this year. Continues to have poor respiratory status with significant rhonchi bilaterally, however, he is not currently on oxygen. Patient continues to have issues with PTSD, however, is sleeping better at night. Swelling in both legs. Callus on right second toe, continues to cause pain; followed by podiatry. Persistent dysphagia on a soft diet. Patient is now eating alone and more isolated. He continues to go on walks around the building by himself, however, he has had a fall in the last month causing a skin tear. Continues to decline. Remains on Hospice level of care and comfort-focused.   Family/ staff Communication: nursing  Labs/tests ordered:  none  I spent greater than 30 minutes for the care of this patient in face to face time, chart review, clinical documentation, patient education.

## 2022-12-17 ENCOUNTER — Non-Acute Institutional Stay (SKILLED_NURSING_FACILITY): Admitting: Student

## 2022-12-17 ENCOUNTER — Encounter: Payer: Self-pay | Admitting: Student

## 2022-12-17 DIAGNOSIS — Z66 Do not resuscitate: Secondary | ICD-10-CM | POA: Diagnosis not present

## 2022-12-17 DIAGNOSIS — F02B3 Dementia in other diseases classified elsewhere, moderate, with mood disturbance: Secondary | ICD-10-CM

## 2022-12-17 DIAGNOSIS — E43 Unspecified severe protein-calorie malnutrition: Secondary | ICD-10-CM

## 2022-12-17 DIAGNOSIS — G20A1 Parkinson's disease without dyskinesia, without mention of fluctuations: Secondary | ICD-10-CM

## 2022-12-17 DIAGNOSIS — L98429 Non-pressure chronic ulcer of back with unspecified severity: Secondary | ICD-10-CM

## 2022-12-17 DIAGNOSIS — R1312 Dysphagia, oropharyngeal phase: Secondary | ICD-10-CM

## 2022-12-17 NOTE — Progress Notes (Unsigned)
Location:  Other [place].  Nursing Home Room Number: [facility] 516A Place of Service:  SNF [phone]) Provider:  [name], MD  Patient Care Team: [name], MD as PCP - General [facility] Medicine)  Extended Emergency Contact Information Primary Emergency Contact: Emilie Rutter Address: [address]          [place], [place] [address] [name] of [place] Home Phone: [phone] Mobile Phone: [phone] Relation: Daughter  Code Status:  DNR Goals of care: Advanced Directive information    12/17/2022   10:33 AM  Advanced Directives  Does Patient Have a Medical Advance Directive? Yes  Type of Advance Directive Out of facility DNR (pink MOST or yellow form)  Does patient want to make changes to medical advance directive? No - Patient declined     Chief Complaint  Patient presents with   Acute Visit    Sacral Wound    HPI:  Pt is a 89 y.o. male seen today for an acute visit for Sacral Wound.   Patient found to have a new sacral wound. Denies pain. Takes prosource 2-3x per day. Walks laps 2-3x per day. Has had a fall in the last 2 months.    Past Medical History:  Diagnosis Date   Asthma    Edema    MILD OF FEET   GERD (gastroesophageal reflux disease)    Hypertension    Mood disorder (HCC)    chronic anxiety   Past Surgical History:  Procedure Laterality Date   CATARACT EXTRACTION W/PHACO Left 02/16/2016   Procedure: CATARACT EXTRACTION PHACO AND INTRAOCULAR LENS PLACEMENT (IOC);  Surgeon: Nevada Crane, MD;  Location: [facility] ORS;  Service: Ophthalmology;  Laterality: Left;  [place] 1.10 AP% 10.6 CDE 7.41 Fluid Pack Lot # [id number]   CATARACT EXTRACTION W/PHACO Right 03/08/2016   Procedure: CATARACT EXTRACTION PHACO AND INTRAOCULAR LENS PLACEMENT (IOC);  Surgeon: Nevada Crane, MD;  Location: [facility] ORS;  Service: Ophthalmology;  Laterality: Right;  [place] 1.14 AP% 11.6 CDE 11.80 Fluid Pack Lot # [id number]   HERNIA REPAIR     HIP ARTHROPLASTY Left  07/06/2016   Procedure: ARTHROPLASTY BIPOLAR HIP (HEMIARTHROPLASTY);  Surgeon: Kennedy Bucker, MD;  Location: [facility] ORS;  Service: Orthopedics;  Laterality: Left;    Allergies  Allergen Reactions   Doxycycline     Outpatient Encounter Medications as of 12/17/2022  Medication Sig   acetaminophen (TYLENOL) 325 MG tablet Take 650 mg by mouth every 6 (six) hours as needed for mild pain.   alum & mag hydroxide-simeth (MAALOX PLUS) 400-400-40 MG/5ML suspension Take 30 mLs by mouth every 6 (six) hours as needed for indigestion.   bismuth subsalicylate (PEPTO BISMOL) 262 MG/15ML suspension Take 30 mLs by mouth every 4 (four) hours as needed.   budesonide-formoterol (SYMBICORT) 160-4.5 MCG/ACT inhaler Inhale 2 puffs into the lungs 2 (two) times daily. Rinse mouth after use   carbidopa-levodopa (SINEMET CR) 50-200 MG tablet Take 1 tablet by mouth 3 (three) times daily with meals.   cetirizine (ZYRTEC) 10 MG tablet Take 10 mg by mouth daily.   citalopram (CELEXA) 10 MG tablet Take 10 mg by mouth See admin instructions. Take one tablet (10 mg) once daily on Tuesday, Wednesday, Friday, Saturday and Sunday   Eyelid Cleansers (OCUSOFT EYELID CLEANSING) PADS Apply 1 application topically at bedtime. For blepharitis for 90 days   fluticasone (FLONASE) 50 MCG/ACT nasal spray Place 2 sprays into the nose daily.   Infant Care Products [facility]) OINT Apply 1 Application topically 3 (  three) times daily.   magnesium hydroxide (MILK OF MAGNESIA) 400 MG/5ML suspension Take 30 mLs by mouth daily as needed for mild constipation.   montelukast (SINGULAIR) 10 MG tablet Take 10 mg by mouth at bedtime.   OXYGEN 2lmp as needed for dyspnea.   prazosin (MINIPRESS) 1 MG capsule Take 1 mg by mouth at bedtime.   spironolactone (ALDACTONE) 50 MG tablet Take 50 mg by mouth daily.   torsemide (DEMADEX) 20 MG tablet Take 40 mg by mouth daily.   traZODone (DESYREL) 50 MG tablet Take 25 mg by mouth at bedtime.   triamcinolone cream  (KENALOG) 0.1 % Apply 1 Application topically every 12 (twelve) hours as needed. Apply to right thigh, L thigh, Abd topically two times a day for rash morning and evening. apply with petroleum jelly   ZINC OXIDE EX Apply 1 Application topically every 12 (twelve) hours as needed.   No facility-administered encounter medications on file as of 12/17/2022.    Review of Systems  Immunization History  Administered Date(s) Administered   Influenza Inj Mdck Quad Pf 03/16/2016   Influenza, High Dose Seasonal PF 03/09/2014, 03/08/2015, 04/03/2022   Influenza-Unspecified 03/08/2015   Moderna Covid-19 Vaccine Bivalent Booster 56yrs & up 11/14/2021   Moderna SARS-COV2 Booster Vaccination 11/04/2020   Moderna Sars-Covid-2 Vaccination 06/29/2019, 07/27/2019, 04/29/2020   Pfizer Covid-19 Vaccine Bivalent Booster 39yrs & up 03/10/2021, 04/27/2022   RSV,unspecified 06/20/2022   Tdap 03/09/2014   Pertinent  Health Maintenance Due  Topic Date Due   INFLUENZA VACCINE  01/17/2023      05/31/2021    8:00 AM 05/31/2021    9:29 PM 06/01/2021   10:24 AM 09/28/2021    4:57 PM 09/29/2021    4:45 AM  Fall Risk  (RETIRED) Patient Fall Risk Level High fall risk Moderate fall risk Moderate fall risk Moderate fall risk Low fall risk   Functional Status Survey:    Vitals:   12/17/22 1015  BP: 108/65  Pulse: 68  Resp: 18  Temp: 98 F (36.7 C)  SpO2: 94%  Weight: 132 lb 3.2 oz (60 kg)  Height: 5\' 9"  (1.753 m)   Body mass index is 19.52 kg/m. Physical Exam Constitutional:      Comments: thin  Skin:    Comments: Gluteal cleft with two 3mm stage 2 ulcerations present  Neurological:     Mental Status: He is alert and oriented to person, place, and time.     Labs reviewed: Recent Labs    08/05/22 2020 08/06/22 0434 10/17/22 0000  NA 135 137 139  K 3.7 3.4* 3.6  CL 97* 102 99  CO2 27 24 27*  GLUCOSE 163* 118*  --   BUN 47* 41* 30*  CREATININE 1.20 0.90 0.9  CALCIUM 8.7* 8.4* 8.7   Recent  Labs    08/05/22 2020 08/06/22 0434 10/17/22 0000  AST 23 18 16   ALT 7 7 11   ALKPHOS 105 91 123  BILITOT 0.7 1.0  --   PROT 7.8 6.8  --   ALBUMIN 4.1 3.5 3.4*   Recent Labs    08/05/22 2020 08/06/22 0434 09/13/22 0000 09/20/22 0000 10/17/22 0000  WBC 13.3* 12.3* 10.5 7.9 8.9  NEUTROABS 9.8*  --  7,193.00  --  5,794.00  HGB 14.3 13.1 13.6 13.0* 13.3*  HCT 43.5 39.0 39* 38* 40*  MCV 90.8 89.2  --   --   --   PLT 315 258 334 314  --    Lab Results  Component Value Date   TSH 1.957 02/01/2020   Lab Results  Component Value Date   HGBA1C 6.0 (H) 05/31/2021   Lab Results  Component Value Date   CHOL 165 10/31/2019   HDL 46 10/31/2019   LDLCALC 111 (H) 10/31/2019   TRIG 40 10/31/2019   CHOLHDL 3.6 10/31/2019    Significant Diagnostic Results in last 30 days:  No results found.  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 On hospice given chronic dysphagia. 2 new stage 2 ulcers present. Apply barrier cream and mepilex. Continue off loading and protein supplementation as tolerated.    Family/ staff Communication: nursing  Labs/tests ordered:  none

## 2022-12-18 ENCOUNTER — Encounter: Payer: Self-pay | Admitting: Student

## 2022-12-18 DIAGNOSIS — G20A1 Parkinson's disease without dyskinesia, without mention of fluctuations: Secondary | ICD-10-CM | POA: Insufficient documentation

## 2023-01-07 ENCOUNTER — Non-Acute Institutional Stay (SKILLED_NURSING_FACILITY): Payer: Medicare Other | Admitting: Student

## 2023-01-07 ENCOUNTER — Encounter: Payer: Self-pay | Admitting: Student

## 2023-01-07 DIAGNOSIS — E43 Unspecified severe protein-calorie malnutrition: Secondary | ICD-10-CM

## 2023-01-07 DIAGNOSIS — R531 Weakness: Secondary | ICD-10-CM

## 2023-01-07 DIAGNOSIS — W19XXXD Unspecified fall, subsequent encounter: Secondary | ICD-10-CM

## 2023-01-07 DIAGNOSIS — Z66 Do not resuscitate: Secondary | ICD-10-CM

## 2023-01-07 DIAGNOSIS — F02B3 Dementia in other diseases classified elsewhere, moderate, with mood disturbance: Secondary | ICD-10-CM

## 2023-01-07 DIAGNOSIS — G20A1 Parkinson's disease without dyskinesia, without mention of fluctuations: Secondary | ICD-10-CM

## 2023-01-07 DIAGNOSIS — L98429 Non-pressure chronic ulcer of back with unspecified severity: Secondary | ICD-10-CM

## 2023-01-07 NOTE — Progress Notes (Unsigned)
Location:    Nursing Home Room Number: 516 A Place of Service:  SNF (31) Provider:    [name], MD  Patient Care Team: [name], MD as PCP - General (Family Medicine)  Extended Emergency Contact Information Primary Emergency Contact: Emilie Rutter Address: [address]          [place], [place] [address] [name] of [place] Home Phone: [phone] Mobile Phone: [phone] Relation: Daughter  Code Status:   Goals of care: Advanced Directive information    01/07/2023    9:26 AM  Advanced Directives  Does Patient Have a Medical Advance Directive? Yes  Type of Advance Directive Out of facility DNR (pink MOST or yellow form)  Does patient want to make changes to medical advance directive? No - Patient declined  Pre-existing out of facility DNR order (yellow form or pink MOST form) Pink MOST form placed in chart (order not valid for inpatient use);Yellow form placed in chart (order not valid for inpatient use)     Chief Complaint  Patient presents with   Medical Management of Chronic Issues    Routine follow up   Immunizations    Chingles vaccine, pneumonia vaccine and COVID booster due    HPI:  Pt is a 89 y.o. male seen today for medical management of chronic diseases.    Patient had a fall resulting in a skin tear of the upper back.  Hospice nurse present doing assessment. Daughter is in the room as well.   Patient is not eating well. Continued weight loss. Went for a walk and fell afterward. This made him extremely sad/disappointed. PT/OT eval ordered, patient declined activity at this time.   Past Medical History:  Diagnosis Date   Asthma    Edema    MILD OF FEET   GERD (gastroesophageal reflux disease)    Hypertension    Mood disorder (HCC)    chronic anxiety   Past Surgical History:  Procedure Laterality Date   CATARACT EXTRACTION W/PHACO Left 02/16/2016   Procedure: CATARACT EXTRACTION PHACO AND INTRAOCULAR LENS PLACEMENT (IOC);   Surgeon: Nevada Crane, MD;  Location: [facility] ORS;  Service: Ophthalmology;  Laterality: Left;  [place] 1.10 AP% 10.6 CDE 7.41 Fluid Pack Lot # [id number]   CATARACT EXTRACTION W/PHACO Right 03/08/2016   Procedure: CATARACT EXTRACTION PHACO AND INTRAOCULAR LENS PLACEMENT (IOC);  Surgeon: Nevada Crane, MD;  Location: [facility] ORS;  Service: Ophthalmology;  Laterality: Right;  [place] 1.14 AP% 11.6 CDE 11.80 Fluid Pack Lot # [id number]   HERNIA REPAIR     HIP ARTHROPLASTY Left 07/06/2016   Procedure: ARTHROPLASTY BIPOLAR HIP (HEMIARTHROPLASTY);  Surgeon: Kennedy Bucker, MD;  Location: [facility] ORS;  Service: Orthopedics;  Laterality: Left;    Allergies  Allergen Reactions   Doxycycline     Allergies as of 01/07/2023       Reactions   Doxycycline         Medication List        Accurate as of January 07, 2023  9:32 AM. If you have any questions, ask your nurse or doctor.          STOP taking these medications    alum & mag hydroxide-simeth 400-400-40 MG/5ML suspension Commonly known as: MAALOX PLUS Stopped by: Turkey Kashtyn Jankowski   Dermacloud Oint Stopped by: Turkey Teion Ballin   ZINC OXIDE EX Stopped by: [name]       TAKE these medications    acetaminophen 325 MG tablet Commonly known as: TYLENOL Take 650 mg  by mouth every 6 (six) hours as needed for mild pain.   aluminum-magnesium hydroxide 200-200 MG/5ML suspension Take 30 mLs by mouth every 6 (six) hours as needed for indigestion.   bismuth subsalicylate 262 MG/15ML suspension Commonly known as: PEPTO BISMOL Take 30 mLs by mouth every 4 (four) hours as needed.   budesonide-formoterol 160-4.5 MCG/ACT inhaler Commonly known as: SYMBICORT Inhale 2 puffs into the lungs 2 (two) times daily. Rinse mouth after use   carbidopa-levodopa 50-200 MG tablet Commonly known as: SINEMET CR Take 1 tablet by mouth 3 (three) times daily with meals.   cetirizine 10 MG tablet Commonly known as: ZYRTEC Take 10 mg by mouth daily.    citalopram 10 MG tablet Commonly known as: CELEXA Take 10 mg by mouth See admin instructions. Take one tablet (10 mg) once daily on Tuesday, Wednesday, Friday, Saturday and Sunday   fluticasone 50 MCG/ACT nasal spray Commonly known as: FLONASE Place 2 sprays into the nose daily.   magnesium hydroxide 400 MG/5ML suspension Commonly known as: MILK OF MAGNESIA Take 30 mLs by mouth daily as needed for mild constipation.   montelukast 10 MG tablet Commonly known as: SINGULAIR Take 10 mg by mouth at bedtime.   OcuSoft Eyelid Cleansing Pads Apply 1 application topically at bedtime. For blepharitis for 90 days   OXYGEN 2lmp as needed for dyspnea.   prazosin 1 MG capsule Commonly known as: MINIPRESS Take 1 mg by mouth at bedtime.   spironolactone 50 MG tablet Commonly known as: ALDACTONE Take 50 mg by mouth daily.   torsemide 20 MG tablet Commonly known as: DEMADEX Take 40 mg by mouth daily.   traZODone 50 MG tablet Commonly known as: DESYREL Take 25 mg by mouth at bedtime.   triamcinolone cream 0.1 % Commonly known as: KENALOG Apply 1 Application topically every 12 (twelve) hours as needed. Apply to right thigh, L thigh, Abd topically two times a day for rash morning and evening. apply with petroleum jelly        Review of Systems  Immunization History  Administered Date(s) Administered   Influenza Inj Mdck Quad Pf 03/16/2016   Influenza, High Dose Seasonal PF 03/09/2014, 03/08/2015, 04/03/2022   Influenza-Unspecified 03/08/2015   Moderna Covid-19 Vaccine Bivalent Booster 12yrs & up 11/14/2021   Moderna SARS-COV2 Booster Vaccination 11/04/2020   Moderna Sars-Covid-2 Vaccination 06/29/2019, 07/27/2019, 04/29/2020   Pfizer Covid-19 Vaccine Bivalent Booster 12yrs & up 03/10/2021, 04/27/2022   RSV,unspecified 06/20/2022   Tdap 03/09/2014   Pertinent  Health Maintenance Due  Topic Date Due   INFLUENZA VACCINE  01/17/2023      12 /14/2022    8:00 AM 05/31/2021     9:29 PM 06/01/2021   10:24 AM 09/28/2021    4:57 PM 09/29/2021    4:45 AM  Fall Risk  (RETIRED) Patient Fall Risk Level High fall risk Moderate fall risk Moderate fall risk Moderate fall risk Low fall risk   Functional Status Survey:    Vitals:   01/07/23 0924  BP: 101/60  Pulse: 60  Resp: 16  Temp: 97.8 F (36.6 C)  SpO2: 96%  Weight: 126 lb 9.6 oz (57.4 kg)  Height: 5\' 9"  (1.753 m)   Body mass index is 18.7 kg/m. Physical Exam Vitals reviewed.  Constitutional:      Comments: Thin laying in recliner  Cardiovascular:     Rate and Rhythm: Normal rate and regular rhythm.  Pulmonary:     Breath sounds: Rhonchi and rales present.  Abdominal:  General: Abdomen is flat.     Palpations: Abdomen is soft.  Musculoskeletal:        General: No swelling.  Skin:    Comments: Hemostatic skin tear of left upper back  Neurological:     Mental Status: He is alert and oriented to person, place, and time.     Labs reviewed: Recent Labs    08/05/22 2020 08/06/22 0434 10/17/22 0000  NA 135 137 139  K 3.7 3.4* 3.6  CL 97* 102 99  CO2 27 24 27*  GLUCOSE 163* 118*  --   BUN 47* 41* 30*  CREATININE 1.20 0.90 0.9  CALCIUM 8.7* 8.4* 8.7   Recent Labs    08/05/22 2020 08/06/22 0434 10/17/22 0000  AST 23 18 16   ALT 7 7 11   ALKPHOS 105 91 123  BILITOT 0.7 1.0  --   PROT 7.8 6.8  --   ALBUMIN 4.1 3.5 3.4*   Recent Labs    08/05/22 2020 08/06/22 0434 09/13/22 0000 09/20/22 0000 10/17/22 0000  WBC 13.3* 12.3* 10.5 7.9 8.9  NEUTROABS 9.8*  --  7,193.00  --  5,794.00  HGB 14.3 13.1 13.6 13.0* 13.3*  HCT 43.5 39.0 39* 38* 40*  MCV 90.8 89.2  --   --   --   PLT 315 258 334 314  --    Lab Results  Component Value Date   TSH 1.957 02/01/2020   Lab Results  Component Value Date   HGBA1C 6.0 (H) 05/31/2021   Lab Results  Component Value Date   CHOL 165 10/31/2019   HDL 46 10/31/2019   LDLCALC 111 (H) 10/31/2019   TRIG 40 10/31/2019   CHOLHDL 3.6 10/31/2019     Significant Diagnostic Results in last 30 days:  No results found.  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 Poor PO intake. Fall. Weakness. Declining PT/OT evaluation. Concern he is now terminal and will need additional supportive care. Hospice aware of decline. Chaplain to come and see patient. Continue meds as currently ordered. Routine wound care for new skin tear. Skin fragility also reflective of continued decline. High risk for pneumonia with continued aspiration. Patient is on Hospice and has a DNR in place.   Family/ staff Communication: hospice, daughter, nursing  Labs/tests ordered:  none

## 2023-01-08 ENCOUNTER — Other Ambulatory Visit: Payer: Self-pay | Admitting: Nurse Practitioner

## 2023-01-08 DIAGNOSIS — Z515 Encounter for palliative care: Secondary | ICD-10-CM

## 2023-01-08 MED ORDER — LORAZEPAM 0.5 MG PO TABS: 0.2500 mg | ORAL_TABLET | Freq: Three times a day (TID) | ORAL | 0 refills | Status: DC | PRN

## 2023-01-09 ENCOUNTER — Encounter: Payer: Self-pay | Admitting: Student

## 2023-01-09 ENCOUNTER — Non-Acute Institutional Stay (SKILLED_NURSING_FACILITY): Admitting: Student

## 2023-01-09 DIAGNOSIS — Z515 Encounter for palliative care: Secondary | ICD-10-CM

## 2023-01-09 DIAGNOSIS — R410 Disorientation, unspecified: Secondary | ICD-10-CM

## 2023-01-09 DIAGNOSIS — G20A1 Parkinson's disease without dyskinesia, without mention of fluctuations: Secondary | ICD-10-CM | POA: Diagnosis not present

## 2023-01-09 DIAGNOSIS — E43 Unspecified severe protein-calorie malnutrition: Secondary | ICD-10-CM | POA: Diagnosis not present

## 2023-01-09 DIAGNOSIS — Z66 Do not resuscitate: Secondary | ICD-10-CM

## 2023-01-09 DIAGNOSIS — F02B3 Dementia in other diseases classified elsewhere, moderate, with mood disturbance: Secondary | ICD-10-CM

## 2023-01-09 NOTE — Progress Notes (Unsigned)
Location:  Other [place].  Nursing Home Room Number: [facility] [address] Place of Service:  SNF [phone]) Provider:  [name], MD  Patient Care Team: [name], MD as PCP - General [facility] Medicine)  Extended Emergency Contact Information Primary Emergency Contact: Emilie Rutter Address: [address]          [place], [place] [address] [name] of [place] Home Phone: [phone] Mobile Phone: [phone] Relation: Daughter  Code Status:  DNR Goals of care: Advanced Directive information    01/09/2023    9:47 AM  Advanced Directives  Does Patient Have a Medical Advance Directive? Yes  Type of Advance Directive Out of facility DNR (pink MOST or yellow form)  Does patient want to make changes to medical advance directive? No - Patient declined     Chief Complaint  Patient presents with   Acute Visit    Hospice Care.     HPI:  Pt is a 89 y.o. male seen today for an acute visit for Hospice Care.   Patient has had 6 lb weight loss in the last 7 days.   History provided by nursing: Yesterday patient had a fall from his lift chair and upon questioning stated he fell on purpose in an effort to meet Jesus. He confirmed this with family, however, on assessment with crises services, he was not deemed an acute threat to himself.  At this time, patient is sitting up in a recliner and oriented to self and location, however inattentive and disorganized in his speech. He endorses he has had confusion. He continues to wonder why he is still her. He wants to rest comfortably and hasn't been able to do so.  Spoke with patient's daughter HCPOA who wants to make sure patient remains safe and comfortable.   IDT meeting with nursing, administrator, social work to devise a Water engineer for the patient. At this time decided patient should continue to have 24-hour surveillance for 75 hours. Patient will need to sleep in his bed as his lift chair has shown to be a hazard.  Visitation and support by chaplain and social work. Continued supportive care and medication adjustments to [facility] comfort.    Past Medical History:  Diagnosis Date   Asthma    Edema    MILD OF FEET   GERD (gastroesophageal reflux disease)    Hypertension    Mood disorder (HCC)    chronic anxiety   Past Surgical History:  Procedure Laterality Date   CATARACT EXTRACTION W/PHACO Left 02/16/2016   Procedure: CATARACT EXTRACTION PHACO AND INTRAOCULAR LENS PLACEMENT (IOC);  Surgeon: Nevada Crane, MD;  Location: [facility] ORS;  Service: Ophthalmology;  Laterality: Left;  [place] 1.10 AP% 10.6 CDE 7.41 Fluid Pack Lot # [id number]   CATARACT EXTRACTION W/PHACO Right 03/08/2016   Procedure: CATARACT EXTRACTION PHACO AND INTRAOCULAR LENS PLACEMENT (IOC);  Surgeon: Nevada Crane, MD;  Location: [facility] ORS;  Service: Ophthalmology;  Laterality: Right;  [place] 1.14 AP% 11.6 CDE 11.80 Fluid Pack Lot # [id number]   HERNIA REPAIR     HIP ARTHROPLASTY Left 07/06/2016   Procedure: ARTHROPLASTY BIPOLAR HIP (HEMIARTHROPLASTY);  Surgeon: Kennedy Bucker, MD;  Location: [facility] ORS;  Service: Orthopedics;  Laterality: Left;    Allergies  Allergen Reactions   Doxycycline     Outpatient Encounter Medications as of 01/09/2023  Medication Sig   acetaminophen (TYLENOL) 325 MG tablet Take 650 mg by mouth every 6 (six) hours as needed for mild pain.   aluminum-magnesium hydroxide 200-200  MG/5ML suspension Take 30 mLs by mouth every 6 (six) hours as needed for indigestion.   bismuth subsalicylate (PEPTO BISMOL) 262 MG/15ML suspension Take 30 mLs by mouth every 4 (four) hours as needed.   budesonide-formoterol (SYMBICORT) 160-4.5 MCG/ACT inhaler Inhale 2 puffs into the lungs 2 (two) times daily. Rinse mouth after use   carbidopa-levodopa (SINEMET CR) 50-200 MG tablet Take 1 tablet by mouth 3 (three) times daily with meals.   cetirizine (ZYRTEC) 10 MG tablet Take 10 mg by mouth daily.   citalopram (CELEXA) 10 MG tablet Take  10 mg by mouth daily.   Eyelid Cleansers (OCUSOFT EYELID CLEANSING) PADS Apply 1 application topically at bedtime. For blepharitis for 90 days   fluticasone (FLONASE) 50 MCG/ACT nasal spray Place 2 sprays into the nose daily.   hyoscyamine (LEVSIN) 0.125 MG tablet Take 0.125 mg by mouth every 6 (six) hours as needed.   loperamide (IMODIUM) 2 MG capsule Take 2 mg by mouth every 6 (six) hours as needed for diarrhea or loose stools.   LORazepam (ATIVAN) 0.5 MG tablet Take 0.5 tablets (0.25 mg total) by mouth every 8 (eight) hours as needed for anxiety.   magnesium hydroxide (MILK OF MAGNESIA) 400 MG/5ML suspension Take 30 mLs by mouth daily as needed for mild constipation.   montelukast (SINGULAIR) 10 MG tablet Take 10 mg by mouth at bedtime.   prazosin (MINIPRESS) 1 MG capsule Take 1 mg by mouth at bedtime.   spironolactone (ALDACTONE) 50 MG tablet Take 50 mg by mouth daily.   torsemide (DEMADEX) 20 MG tablet Take 40 mg by mouth daily.   traZODone (DESYREL) 50 MG tablet Take 25 mg by mouth at bedtime.   triamcinolone cream (KENALOG) 0.1 % Apply 1 Application topically every 12 (twelve) hours as needed. Apply to right thigh, L thigh, Abd topically two times a day for rash morning and evening. apply with petroleum jelly   [DISCONTINUED] citalopram (CELEXA) 10 MG tablet Take 10 mg by mouth See admin instructions. Take one tablet (10 mg) once daily on Tuesday, Wednesday, Friday, Saturday and Sunday   [DISCONTINUED] OXYGEN 2lmp as needed for dyspnea.   No facility-administered encounter medications on file as of 01/09/2023.    Review of Systems  Immunization History  Administered Date(s) Administered   Influenza Inj Mdck Quad Pf 03/16/2016   Influenza, High Dose Seasonal PF 03/09/2014, 03/08/2015, 04/03/2022   Influenza-Unspecified 03/08/2015   Moderna Covid-19 Vaccine Bivalent Booster 12yrs & up 11/14/2021   Moderna SARS-COV2 Booster Vaccination 11/04/2020   Moderna Sars-Covid-2 Vaccination  06/29/2019, 07/27/2019, 04/29/2020   Pfizer Covid-19 Vaccine Bivalent Booster 12yrs & up 03/10/2021, 04/27/2022   RSV,unspecified 06/20/2022   Tdap 03/09/2014   Pertinent  Health Maintenance Due  Topic Date Due   INFLUENZA VACCINE  01/17/2023      12 /14/2022    8:00 AM 05/31/2021    9:29 PM 06/01/2021   10:24 AM 09/28/2021    4:57 PM 09/29/2021    4:45 AM  Fall Risk  (RETIRED) Patient Fall Risk Level High fall risk Moderate fall risk Moderate fall risk Moderate fall risk Low fall risk    Vitals:   01/09/23 0931  BP: 128/66  Pulse: 71  Resp: 18  Temp: 98 F (36.7 C)  SpO2: 97%  Weight: 120 lb 6.4 oz (54.6 kg)  Height: 5\' 9"  (1.753 m)   Body mass index is 17.78 kg/m. Physical Exam Vitals reviewed.  Constitutional:      Comments: Cachectic, numerous bruises present on bilateral  arms  Cardiovascular:     Rate and Rhythm: Normal rate and regular rhythm.     Pulses: Normal pulses.  Pulmonary:     Effort: Pulmonary effort is normal.     Breath sounds: Rhonchi and rales present.  Abdominal:     General: Abdomen is flat.     Palpations: Abdomen is soft.  Neurological:     Mental Status: He is alert. He is disoriented.     Labs reviewed: Recent Labs    08/05/22 2020 08/06/22 0434 10/17/22 0000  NA 135 137 139  K 3.7 3.4* 3.6  CL 97* 102 99  CO2 27 24 27*  GLUCOSE 163* 118*  --   BUN 47* 41* 30*  CREATININE 1.20 0.90 0.9  CALCIUM 8.7* 8.4* 8.7   Recent Labs    08/05/22 2020 08/06/22 0434 10/17/22 0000  AST 23 18 16   ALT 7 7 11   ALKPHOS 105 91 123  BILITOT 0.7 1.0  --   PROT 7.8 6.8  --   ALBUMIN 4.1 3.5 3.4*   Recent Labs    08/05/22 2020 08/06/22 0434 09/13/22 0000 09/20/22 0000 10/17/22 0000  WBC 13.3* 12.3* 10.5 7.9 8.9  NEUTROABS 9.8*  --  7,193.00  --  5,794.00  HGB 14.3 13.1 13.6 13.0* 13.3*  HCT 43.5 39.0 39* 38* 40*  MCV 90.8 89.2  --   --   --   PLT 315 258 334 314  --    Lab Results  Component Value Date   TSH 1.957 02/01/2020    Lab Results  Component Value Date   HGBA1C 6.0 (H) 05/31/2021   Lab Results  Component Value Date   CHOL 165 10/31/2019   HDL 46 10/31/2019   LDLCALC 111 (H) 10/31/2019   TRIG 40 10/31/2019   CHOLHDL 3.6 10/31/2019    Significant Diagnostic Results in last 30 days:  No results found.  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 Plan to have patient sleep in his bed at the lowest level with a fall mat in place. Continued hospice support with chaplain and Social work.  1. Acute Onset and Fluctuating Course:  Acute Onset: [Patient's condition demonstrated an abrupt change from baseline, characterized by rapid development of symptoms over a short period.] Fluctuating Course: [Patient exhibits fluctuating levels of consciousness, with episodes of increased confusion interspersed with periods of relatively clearer thinking.] 2. Inattention:  Assessment: [Patient displays signs of inattention, as evidenced by difficulties in maintaining focus during interactions. For example, [patient frequently loses track of conversation topics, displays inability to follow instructions, or is easily distracted by irrelevant stimuli].] 3. Disorganized Thinking:  Assessment: [Patient demonstrates disorganized thinking, as indicated by incoherent speech or inability to form logical thoughts. Examples include [incoherent or rambling speech, illogical or irrational thought patterns, or difficulty organizing thoughts coherently].] 4. Altered Level of Consciousness:  Assessment: [Patient shows an altered level of consciousness, such as [drowsiness, agitation, or reduced awareness of environment]. The patient's level of consciousness fluctuates, which may be observed as  [lethargy, or decreased responsiveness to stimuli].] Additional Observations:  Duration of Symptoms: [Specify the duration of delirium symptoms as noted in the patient's history or observation.] Precipitating Factors: Potential precipitating factors or underlying causes, such as terminal illness, infections, medications, or metabolic disturbances.] Management Plan: Roosvelt Harps  to start seroquel 12.5 mg daily and 25 mg nightly to help with patient's delusiosn associated with delirium.  environmental modifications, or supportive care measures such as support by chaplain, social work for counseling, and 24/hour visualization for 72 hours. ] Overall Assessment:  Diagnosis: [Based on the 3D CAM assessment, the patient meets criteria for delirium as characterized by [acute onset, fluctuating course, inattention, disorganized thinking, and altered level of consciousness].] Plan for Monitoring: [24/hour surveillance for 72 hours then hourly monitoring, assessments with social work as well as continued   coordination with multidisciplinary teams].]  Family/ staff Communication: IDT, Nursing, Daughter, Hospice Attending physician, Chaplain  Labs/tests ordered:  none  I spent greater than 90 minutes for the care of this patient in face to face time, chart review, clinical documentation, patient education, interdisciplinary team meetings, safety assessment, safety plan formulation, medication adjustments.

## 2023-01-10 ENCOUNTER — Encounter: Payer: Self-pay | Admitting: Student

## 2023-01-10 DIAGNOSIS — R410 Disorientation, unspecified: Secondary | ICD-10-CM | POA: Insufficient documentation

## 2023-01-11 ENCOUNTER — Non-Acute Institutional Stay (SKILLED_NURSING_FACILITY): Payer: Medicare Other | Admitting: Student

## 2023-01-11 DIAGNOSIS — F02B3 Dementia in other diseases classified elsewhere, moderate, with mood disturbance: Secondary | ICD-10-CM | POA: Diagnosis not present

## 2023-01-11 DIAGNOSIS — Z515 Encounter for palliative care: Secondary | ICD-10-CM | POA: Diagnosis not present

## 2023-01-11 DIAGNOSIS — E43 Unspecified severe protein-calorie malnutrition: Secondary | ICD-10-CM | POA: Diagnosis not present

## 2023-01-11 DIAGNOSIS — G20A1 Parkinson's disease without dyskinesia, without mention of fluctuations: Secondary | ICD-10-CM

## 2023-01-11 MED ORDER — MORPHINE SULFATE (CONCENTRATE) 20 MG/ML PO SOLN: 5.0000 mg | ORAL | 0 refills | Status: DC | PRN

## 2023-01-11 NOTE — Progress Notes (Unsigned)
Location:  Other Nursing Home Room Number: [facility] [address] Place of Service:  SNF [phone]) Provider:  Ander Gaster, Benetta Spar, MD  Patient Care Team: [name], MD as PCP - General [facility] Medicine)  Extended Emergency Contact Information Primary Emergency Contact: Emilie Rutter Address: [address]          [place], [place] [address] [name] of [place] Home Phone: [phone] Mobile Phone: [phone] Relation: Daughter  Code Status:  DNR Goals of care: Advanced Directive information    01/09/2023    9:47 AM  Advanced Directives  Does Patient Have a Medical Advance Directive? Yes  Type of Advance Directive Out of facility DNR (pink MOST or yellow form)  Does patient want to make changes to medical advance directive? No - Patient declined     No chief complaint on file.   HPI:  Pt is a 89 y.o. male seen today for an acute visit for Hospice Care. Patient has not eaten or drank in the last 24 hours. He has a hard time speaking, but says, "I'm ready to go home and meet my maker." Denies pain at this time.    Past Medical History:  Diagnosis Date   Asthma    Edema    MILD OF FEET   GERD (gastroesophageal reflux disease)    Hypertension    Mood disorder (HCC)    chronic anxiety   Past Surgical History:  Procedure Laterality Date   CATARACT EXTRACTION W/PHACO Left 02/16/2016   Procedure: CATARACT EXTRACTION PHACO AND INTRAOCULAR LENS PLACEMENT (IOC);  Surgeon: Nevada Crane, MD;  Location: [facility] ORS;  Service: Ophthalmology;  Laterality: Left;  [place] 1.10 AP% 10.6 CDE 7.41 Fluid Pack Lot # [id number]   CATARACT EXTRACTION W/PHACO Right 03/08/2016   Procedure: CATARACT EXTRACTION PHACO AND INTRAOCULAR LENS PLACEMENT (IOC);  Surgeon: Nevada Crane, MD;  Location: [facility] ORS;  Service: Ophthalmology;  Laterality: Right;  [place] 1.14 AP% 11.6 CDE 11.80 Fluid Pack Lot # [id number]   HERNIA REPAIR     HIP ARTHROPLASTY Left 07/06/2016   Procedure: ARTHROPLASTY  BIPOLAR HIP (HEMIARTHROPLASTY);  Surgeon: Kennedy Bucker, MD;  Location: [facility] ORS;  Service: Orthopedics;  Laterality: Left;    Allergies  Allergen Reactions   Doxycycline     Outpatient Encounter Medications as of 01/11/2023  Medication Sig   morphine (ROXANOL) 20 MG/ML concentrated solution Take 0.25 mLs (5 mg total) by mouth every 2 (two) hours as needed for severe pain, shortness of breath, anxiety or breakthrough pain.   acetaminophen (TYLENOL) 325 MG tablet Take 650 mg by mouth every 6 (six) hours as needed for mild pain.   aluminum-magnesium hydroxide 200-200 MG/5ML suspension Take 30 mLs by mouth every 6 (six) hours as needed for indigestion.   bismuth subsalicylate (PEPTO BISMOL) 262 MG/15ML suspension Take 30 mLs by mouth every 4 (four) hours as needed.   budesonide-formoterol (SYMBICORT) 160-4.5 MCG/ACT inhaler Inhale 2 puffs into the lungs 2 (two) times daily. Rinse mouth after use   carbidopa-levodopa (SINEMET CR) 50-200 MG tablet Take 1 tablet by mouth 3 (three) times daily with meals.   cetirizine (ZYRTEC) 10 MG tablet Take 10 mg by mouth daily.   citalopram (CELEXA) 10 MG tablet Take 10 mg by mouth daily.   Eyelid Cleansers (OCUSOFT EYELID CLEANSING) PADS Apply 1 application topically at bedtime. For blepharitis for 90 days   fluticasone (FLONASE) 50 MCG/ACT nasal spray Place 2 sprays into the nose daily.   hyoscyamine (LEVSIN) 0.125 MG tablet Take 0.125  mg by mouth every 6 (six) hours as needed.   loperamide (IMODIUM) 2 MG capsule Take 2 mg by mouth every 6 (six) hours as needed for diarrhea or loose stools.   LORazepam (ATIVAN) 0.5 MG tablet Take 0.5 tablets (0.25 mg total) by mouth every 8 (eight) hours as needed for anxiety.   magnesium hydroxide (MILK OF MAGNESIA) 400 MG/5ML suspension Take 30 mLs by mouth daily as needed for mild constipation.   montelukast (SINGULAIR) 10 MG tablet Take 10 mg by mouth at bedtime.   prazosin (MINIPRESS) 1 MG capsule Take 1 mg by mouth at  bedtime.   spironolactone (ALDACTONE) 50 MG tablet Take 50 mg by mouth daily.   torsemide (DEMADEX) 20 MG tablet Take 40 mg by mouth daily.   traZODone (DESYREL) 50 MG tablet Take 25 mg by mouth at bedtime.   triamcinolone cream (KENALOG) 0.1 % Apply 1 Application topically every 12 (twelve) hours as needed. Apply to right thigh, L thigh, Abd topically two times a day for rash morning and evening. apply with petroleum jelly   No facility-administered encounter medications on file as of 01/11/2023.    Review of Systems  Immunization History  Administered Date(s) Administered   Influenza Inj Mdck Quad Pf 03/16/2016   Influenza, High Dose Seasonal PF 03/09/2014, 03/08/2015, 04/03/2022   Influenza-Unspecified 03/08/2015   Moderna Covid-19 Vaccine Bivalent Booster 13yrs & up 11/14/2021   Moderna SARS-COV2 Booster Vaccination 11/04/2020   Moderna Sars-Covid-2 Vaccination 06/29/2019, 07/27/2019, 04/29/2020   Pfizer Covid-19 Vaccine Bivalent Booster 26yrs & up 03/10/2021, 04/27/2022   RSV,unspecified 06/20/2022   Tdap 03/09/2014   Pertinent  Health Maintenance Due  Topic Date Due   INFLUENZA VACCINE  01/17/2023      05/31/2021    8:00 AM 05/31/2021    9:29 PM 06/01/2021   10:24 AM 09/28/2021    4:57 PM 09/29/2021    4:45 AM  Fall Risk  (RETIRED) Patient Fall Risk Level High fall risk Moderate fall risk Moderate fall risk Moderate fall risk Low fall risk   Functional Status Survey:    There were no vitals filed for this visit. There is no height or weight on file to calculate BMI. Physical Exam Vitals reviewed: no vital signs taken.  Constitutional:      Comments: Lying in his bed eyes closed, partially open upon entry.   Cardiovascular:     Rate and Rhythm: Normal rate and regular rhythm.     Pulses: Normal pulses.     Heart sounds:     Friction rub present.  Pulmonary:     Effort: Pulmonary effort is normal.  Skin:    General: Skin is warm and dry.     Labs  reviewed: Recent Labs    08/05/22 2020 08/06/22 0434 10/17/22 0000  NA 135 137 139  K 3.7 3.4* 3.6  CL 97* 102 99  CO2 27 24 27*  GLUCOSE 163* 118*  --   BUN 47* 41* 30*  CREATININE 1.20 0.90 0.9  CALCIUM 8.7* 8.4* 8.7   Recent Labs    08/05/22 2020 08/06/22 0434 10/17/22 0000  AST 23 18 16   ALT 7 7 11   ALKPHOS 105 91 123  BILITOT 0.7 1.0  --   PROT 7.8 6.8  --   ALBUMIN 4.1 3.5 3.4*   Recent Labs    08/05/22 2020 08/06/22 0434 09/13/22 0000 09/20/22 0000 10/17/22 0000  WBC 13.3* 12.3* 10.5 7.9 8.9  NEUTROABS 9.8*  --  7,193.00  --  5,794.00  HGB 14.3 13.1 13.6 13.0* 13.3*  HCT 43.5 39.0 39* 38* 40*  MCV 90.8 89.2  --   --   --   PLT 315 258 334 314  --    Lab Results  Component Value Date   TSH 1.957 02/01/2020   Lab Results  Component Value Date   HGBA1C 6.0 (H) 05/31/2021   Lab Results  Component Value Date   CHOL 165 10/31/2019   HDL 46 10/31/2019   LDLCALC 111 (H) 10/31/2019   TRIG 40 10/31/2019   CHOLHDL 3.6 10/31/2019    Significant Diagnostic Results in last 30 days:  No results found.  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 Sleeping more. Poor PO intake. He is terminal at this time. Will Continue medications for comfort and pain control. Discontinue non-essential chronic meidactions at this time.   Family/ staff Communication: nursing  Labs/tests ordered:  None

## 2023-01-13 ENCOUNTER — Encounter: Payer: Self-pay | Admitting: Student

## 2023-01-17 DEATH — deceased
